# Patient Record
Sex: Male | Born: 1950 | Race: White | Hispanic: No | Marital: Married | Smoking: Never smoker
Health system: Southern US, Community
[De-identification: ages and names within clinical notes are randomized; demographics above are authoritative.]

## PROBLEM LIST (undated history)

## (undated) DIAGNOSIS — R0602 Shortness of breath: Secondary | ICD-10-CM

## (undated) DIAGNOSIS — D649 Anemia, unspecified: Secondary | ICD-10-CM

## (undated) DIAGNOSIS — F419 Anxiety disorder, unspecified: Secondary | ICD-10-CM

## (undated) DIAGNOSIS — F411 Generalized anxiety disorder: Secondary | ICD-10-CM

## (undated) DIAGNOSIS — E785 Hyperlipidemia, unspecified: Secondary | ICD-10-CM

## (undated) DIAGNOSIS — J309 Allergic rhinitis, unspecified: Secondary | ICD-10-CM

## (undated) DIAGNOSIS — M1711 Unilateral primary osteoarthritis, right knee: Secondary | ICD-10-CM

## (undated) DIAGNOSIS — I499 Cardiac arrhythmia, unspecified: Secondary | ICD-10-CM

## (undated) DIAGNOSIS — K219 Gastro-esophageal reflux disease without esophagitis: Secondary | ICD-10-CM

## (undated) DIAGNOSIS — M519 Unspecified thoracic, thoracolumbar and lumbosacral intervertebral disc disorder: Secondary | ICD-10-CM

## (undated) DIAGNOSIS — R079 Chest pain, unspecified: Secondary | ICD-10-CM

## (undated) DIAGNOSIS — M109 Gout, unspecified: Secondary | ICD-10-CM

## (undated) DIAGNOSIS — K5730 Diverticulosis of large intestine without perforation or abscess without bleeding: Secondary | ICD-10-CM

## (undated) DIAGNOSIS — I4891 Unspecified atrial fibrillation: Secondary | ICD-10-CM

## (undated) DIAGNOSIS — T7840XA Allergy, unspecified, initial encounter: Secondary | ICD-10-CM

## (undated) DIAGNOSIS — I10 Essential (primary) hypertension: Secondary | ICD-10-CM

## (undated) DIAGNOSIS — M1710 Unilateral primary osteoarthritis, unspecified knee: Secondary | ICD-10-CM

## (undated) DIAGNOSIS — M5412 Radiculopathy, cervical region: Secondary | ICD-10-CM

## (undated) HISTORY — PX: OTHER SURGICAL HISTORY: SHX169

## (undated) HISTORY — DX: Gout, unspecified: M10.9

## (undated) HISTORY — PX: NASAL RECONSTRUCTION: SHX2069

## (undated) HISTORY — PX: KNEE ARTHROSCOPY: SUR90

## (undated) HISTORY — DX: Radiculopathy, cervical region: M54.12

## (undated) HISTORY — DX: Chest pain, unspecified: R07.9

## (undated) HISTORY — DX: Allergy, unspecified, initial encounter: T78.40XA

## (undated) HISTORY — PX: HERNIA REPAIR: SHX51

## (undated) HISTORY — DX: Unilateral primary osteoarthritis, unspecified knee: M17.10

## (undated) HISTORY — PX: COLONOSCOPY: SHX174

## (undated) HISTORY — DX: Hyperlipidemia, unspecified: E78.5

## (undated) HISTORY — DX: Unspecified thoracic, thoracolumbar and lumbosacral intervertebral disc disorder: M51.9

## (undated) HISTORY — DX: Anxiety disorder, unspecified: F41.9

## (undated) HISTORY — DX: Anemia, unspecified: D64.9

## (undated) HISTORY — PX: JOINT REPLACEMENT: SHX530

## (undated) HISTORY — PX: SPINE SURGERY: SHX786

## (undated) HISTORY — DX: Generalized anxiety disorder: F41.1

## (undated) HISTORY — DX: Diverticulosis of large intestine without perforation or abscess without bleeding: K57.30

## (undated) HISTORY — DX: Allergic rhinitis, unspecified: J30.9

## (undated) HISTORY — PX: BACK SURGERY: SHX140

---

## 2002-01-28 ENCOUNTER — Emergency Department (HOSPITAL_COMMUNITY): Admission: EM | Admit: 2002-01-28 | Discharge: 2002-01-29 | Payer: Self-pay | Admitting: Emergency Medicine

## 2002-02-03 ENCOUNTER — Ambulatory Visit (HOSPITAL_COMMUNITY): Admission: RE | Admit: 2002-02-03 | Discharge: 2002-02-03 | Payer: Self-pay | Admitting: Neurosurgery

## 2002-02-05 ENCOUNTER — Ambulatory Visit (HOSPITAL_COMMUNITY): Admission: RE | Admit: 2002-02-05 | Discharge: 2002-02-05 | Payer: Self-pay | Admitting: Gynecology

## 2002-02-05 ENCOUNTER — Encounter: Payer: Self-pay | Admitting: Neurosurgery

## 2002-02-16 ENCOUNTER — Encounter: Admission: RE | Admit: 2002-02-16 | Discharge: 2002-02-16 | Payer: Self-pay | Admitting: Neurosurgery

## 2002-02-16 ENCOUNTER — Encounter: Payer: Self-pay | Admitting: Neurosurgery

## 2002-02-23 HISTORY — PX: OTHER SURGICAL HISTORY: SHX169

## 2002-03-02 ENCOUNTER — Encounter: Admission: RE | Admit: 2002-03-02 | Discharge: 2002-03-02 | Payer: Self-pay | Admitting: Neurosurgery

## 2002-03-02 ENCOUNTER — Encounter: Payer: Self-pay | Admitting: Neurosurgery

## 2002-03-16 ENCOUNTER — Encounter: Payer: Self-pay | Admitting: Neurosurgery

## 2002-03-16 ENCOUNTER — Encounter: Admission: RE | Admit: 2002-03-16 | Discharge: 2002-03-16 | Payer: Self-pay | Admitting: Neurosurgery

## 2002-03-30 ENCOUNTER — Encounter: Admission: RE | Admit: 2002-03-30 | Discharge: 2002-03-30 | Payer: Self-pay | Admitting: Neurosurgery

## 2002-03-30 ENCOUNTER — Encounter: Payer: Self-pay | Admitting: Neurosurgery

## 2002-04-13 ENCOUNTER — Encounter: Admission: RE | Admit: 2002-04-13 | Discharge: 2002-04-13 | Payer: Self-pay | Admitting: Neurosurgery

## 2002-04-13 ENCOUNTER — Encounter: Payer: Self-pay | Admitting: Neurosurgery

## 2002-05-04 ENCOUNTER — Encounter: Payer: Self-pay | Admitting: Neurosurgery

## 2002-05-06 ENCOUNTER — Encounter: Payer: Self-pay | Admitting: Neurosurgery

## 2002-05-06 ENCOUNTER — Inpatient Hospital Stay (HOSPITAL_COMMUNITY): Admission: RE | Admit: 2002-05-06 | Discharge: 2002-05-07 | Payer: Self-pay | Admitting: Neurosurgery

## 2003-03-22 ENCOUNTER — Encounter: Payer: Self-pay | Admitting: Neurosurgery

## 2003-03-22 ENCOUNTER — Ambulatory Visit (HOSPITAL_COMMUNITY): Admission: RE | Admit: 2003-03-22 | Discharge: 2003-03-22 | Payer: Self-pay | Admitting: Neurosurgery

## 2003-03-29 ENCOUNTER — Encounter: Payer: Self-pay | Admitting: Neurosurgery

## 2003-03-29 ENCOUNTER — Inpatient Hospital Stay (HOSPITAL_COMMUNITY): Admission: RE | Admit: 2003-03-29 | Discharge: 2003-04-02 | Payer: Self-pay | Admitting: Neurosurgery

## 2003-04-26 HISTORY — PX: OTHER SURGICAL HISTORY: SHX169

## 2004-07-26 ENCOUNTER — Ambulatory Visit (HOSPITAL_COMMUNITY): Admission: RE | Admit: 2004-07-26 | Discharge: 2004-07-26 | Payer: Self-pay | Admitting: Orthopedic Surgery

## 2004-07-26 HISTORY — PX: OTHER SURGICAL HISTORY: SHX169

## 2005-02-23 HISTORY — PX: OTHER SURGICAL HISTORY: SHX169

## 2005-04-19 ENCOUNTER — Inpatient Hospital Stay (HOSPITAL_COMMUNITY): Admission: RE | Admit: 2005-04-19 | Discharge: 2005-04-22 | Payer: Self-pay | Admitting: Orthopedic Surgery

## 2006-10-23 ENCOUNTER — Ambulatory Visit: Payer: Self-pay | Admitting: Internal Medicine

## 2006-10-23 LAB — CONVERTED CEMR LAB
ALT: 30 units/L (ref 0–40)
AST: 28 units/L (ref 0–37)
Alkaline Phosphatase: 62 units/L (ref 39–117)
Basophils Relative: 3.3 % — ABNORMAL HIGH (ref 0.0–1.0)
GFR calc non Af Amer: 74 mL/min
Glomerular Filtration Rate, Af Am: 89 mL/min/{1.73_m2}
Glucose, Bld: 104 mg/dL — ABNORMAL HIGH (ref 70–99)
HCT: 47 % (ref 39.0–52.0)
HDL: 45.5 mg/dL (ref >39.0)
Leukocytes, UA: NEGATIVE
PSA: 0.54 ng/mL (ref 0.10–4.00)
Platelets: 217 10*3/uL (ref 150–400)
Potassium: 4.4 meq/L (ref 3.5–5.1)
RBC: 5.21 M/uL (ref 4.22–5.81)
RDW: 12.1 % (ref 11.5–14.6)
Sodium: 140 meq/L (ref 135–145)
Specific Gravity, Urine: 1.025 (ref 1.000–1.03)
Total Protein: 6.8 g/dL (ref 6.0–8.3)
Urobilinogen, UA: 0.2 (ref 0.0–1.0)
VLDL: 19 mg/dL (ref 0–40)
WBC: 5.8 10*3/uL (ref 4.5–10.5)
pH: 5.5 (ref 5.0–8.0)

## 2006-10-24 ENCOUNTER — Ambulatory Visit: Payer: Self-pay | Admitting: Internal Medicine

## 2007-02-25 ENCOUNTER — Ambulatory Visit: Payer: Self-pay | Admitting: Internal Medicine

## 2007-03-17 ENCOUNTER — Ambulatory Visit: Payer: Self-pay | Admitting: Internal Medicine

## 2007-12-27 HISTORY — PX: OTHER SURGICAL HISTORY: SHX169

## 2008-09-25 HISTORY — PX: OTHER SURGICAL HISTORY: SHX169

## 2008-09-30 ENCOUNTER — Emergency Department (HOSPITAL_COMMUNITY): Admission: EM | Admit: 2008-09-30 | Discharge: 2008-09-30 | Payer: Self-pay | Admitting: Emergency Medicine

## 2009-07-27 ENCOUNTER — Encounter: Admission: RE | Admit: 2009-07-27 | Discharge: 2009-07-27 | Payer: Self-pay | Admitting: Neurosurgery

## 2010-01-01 ENCOUNTER — Ambulatory Visit: Payer: Self-pay | Admitting: Internal Medicine

## 2010-01-01 LAB — CONVERTED CEMR LAB
Albumin: 4.3 g/dL (ref 3.5–5.2)
Alkaline Phosphatase: 64 units/L (ref 39–117)
BUN: 11 mg/dL (ref 6–23)
Basophils Absolute: 0 10*3/uL (ref 0.0–0.1)
Cholesterol: 214 mg/dL — ABNORMAL HIGH (ref 0–200)
Direct LDL: 122.6 mg/dL
Eosinophils Absolute: 0.1 10*3/uL (ref 0.0–0.7)
GFR calc non Af Amer: 72.78 mL/min (ref >60)
HDL: 58.4 mg/dL (ref >39.00)
Hemoglobin: 14.4 g/dL (ref 13.0–17.0)
Leukocytes, UA: NEGATIVE
Lymphocytes Relative: 25.7 % (ref 12.0–46.0)
MCHC: 33.7 g/dL (ref 30.0–36.0)
Neutro Abs: 3.6 10*3/uL (ref 1.4–7.7)
Nitrite: NEGATIVE
Potassium: 4.3 meq/L (ref 3.5–5.1)
RDW: 12.4 % (ref 11.5–14.6)
Sodium: 145 meq/L (ref 135–145)
TSH: 2.31 microintl units/mL (ref 0.35–5.50)
Total Protein, Urine: NEGATIVE mg/dL
Triglycerides: 210 mg/dL — ABNORMAL HIGH (ref 0.0–149.0)
Urobilinogen, UA: 0.2 (ref 0.0–1.0)
VLDL: 42 mg/dL — ABNORMAL HIGH (ref 0.0–40.0)

## 2010-01-08 ENCOUNTER — Ambulatory Visit: Payer: Self-pay | Admitting: Internal Medicine

## 2010-01-08 DIAGNOSIS — E785 Hyperlipidemia, unspecified: Secondary | ICD-10-CM | POA: Insufficient documentation

## 2010-01-08 DIAGNOSIS — J309 Allergic rhinitis, unspecified: Secondary | ICD-10-CM | POA: Insufficient documentation

## 2010-01-08 DIAGNOSIS — R079 Chest pain, unspecified: Secondary | ICD-10-CM

## 2010-01-08 DIAGNOSIS — M5412 Radiculopathy, cervical region: Secondary | ICD-10-CM

## 2010-01-08 DIAGNOSIS — K5730 Diverticulosis of large intestine without perforation or abscess without bleeding: Secondary | ICD-10-CM

## 2010-01-08 DIAGNOSIS — M109 Gout, unspecified: Secondary | ICD-10-CM

## 2010-01-08 DIAGNOSIS — IMO0002 Reserved for concepts with insufficient information to code with codable children: Secondary | ICD-10-CM

## 2010-01-08 DIAGNOSIS — M1710 Unilateral primary osteoarthritis, unspecified knee: Secondary | ICD-10-CM | POA: Insufficient documentation

## 2010-01-08 DIAGNOSIS — F419 Anxiety disorder, unspecified: Secondary | ICD-10-CM | POA: Insufficient documentation

## 2010-01-08 DIAGNOSIS — F411 Generalized anxiety disorder: Secondary | ICD-10-CM | POA: Insufficient documentation

## 2010-01-08 DIAGNOSIS — D649 Anemia, unspecified: Secondary | ICD-10-CM

## 2010-01-08 HISTORY — DX: Chest pain, unspecified: R07.9

## 2010-01-08 HISTORY — DX: Allergic rhinitis, unspecified: J30.9

## 2010-01-08 HISTORY — DX: Gout, unspecified: M10.9

## 2010-01-08 HISTORY — DX: Reserved for concepts with insufficient information to code with codable children: IMO0002

## 2010-01-08 HISTORY — DX: Anemia, unspecified: D64.9

## 2010-01-08 HISTORY — DX: Diverticulosis of large intestine without perforation or abscess without bleeding: K57.30

## 2010-01-08 HISTORY — DX: Hyperlipidemia, unspecified: E78.5

## 2010-01-08 HISTORY — DX: Radiculopathy, cervical region: M54.12

## 2010-01-16 ENCOUNTER — Telehealth (INDEPENDENT_AMBULATORY_CARE_PROVIDER_SITE_OTHER): Payer: Self-pay | Admitting: *Deleted

## 2010-01-25 ENCOUNTER — Encounter (HOSPITAL_COMMUNITY): Admission: RE | Admit: 2010-01-25 | Discharge: 2010-03-27 | Payer: Self-pay | Admitting: Internal Medicine

## 2010-01-25 ENCOUNTER — Ambulatory Visit: Payer: Self-pay

## 2010-01-25 ENCOUNTER — Ambulatory Visit: Payer: Self-pay | Admitting: Internal Medicine

## 2010-05-02 ENCOUNTER — Ambulatory Visit (HOSPITAL_COMMUNITY): Admission: RE | Admit: 2010-05-02 | Discharge: 2010-05-02 | Payer: Self-pay | Admitting: Neurosurgery

## 2010-05-25 HISTORY — PX: CERVICAL SPINE SURGERY: SHX589

## 2010-06-13 ENCOUNTER — Encounter (INDEPENDENT_AMBULATORY_CARE_PROVIDER_SITE_OTHER): Payer: Self-pay | Admitting: Neurosurgery

## 2010-06-13 ENCOUNTER — Ambulatory Visit (HOSPITAL_COMMUNITY): Admission: RE | Admit: 2010-06-13 | Discharge: 2010-06-13 | Payer: Self-pay | Admitting: Neurosurgery

## 2010-06-13 ENCOUNTER — Ambulatory Visit: Payer: Self-pay | Admitting: Vascular Surgery

## 2010-12-25 NOTE — Assessment & Plan Note (Signed)
Summary: PHYSICAL--[facility]--RE ESTABLISH PT--STC   Vital Signs:  Patient profile:   60 year old male Height:      71 inches Weight:      247 pounds BMI:     34.57 O2 Sat:      98 % on Room air Temp:     98.1 degrees F oral Pulse rate:   72 / minute BP sitting:   128 / 80  (left arm) Cuff size:   large  Vitals Entered ByZella Ball Ewing (January 08, 2010 1:20 PM)  O2 Flow:  Room air  Preventive Care Screening  Colonoscopy:    Date:  03/17/2007    Next Due:  03/2012    Results:  abnormal   Last Tetanus Booster:    Date:  12/27/2007    Results:  Historical      decliens  flu shot todau   History of Present Illness: here for wellness and to re-stalblish and has significant c-spine djd, and apirl 2010 clavicle fx;  plans to f/u with Dr Hersch/NS for c-spine discussion about more surgury; but also with left chest pain, dull, without radiation, not worse to move the shoulder,  not sure about excercise as he has been holding back on excercise and gym work as NS has asked him not to as this point;  no other aggrevating factors;  doe seem better to lie down and rest;  mild to mod, off and on for 5 to 6 months;  assoc sometimes with sob, but no nausea, diaphoresis,  palp or syncope. Pt denies new neuro symptoms such as headache, facial or extremity weakness  Father had pacer at 61 yo and still alive at 61;  wants to go to [place] for vacation soon - only gave up motorcycle a yr ago.    Preventive Screening-Counseling & Management  Alcohol-Tobacco     Smoking Status: never      Drug Use:  no.    Problems Prior to Update: 1)  Chest Pain  (ICD-786.50) 2)  Cervical Radiculopathy, Right  (ICD-723.4) 3)  Anemia-nos  (ICD-285.9) 4)  Diverticulosis, Colon  (ICD-562.10) 5)  Anxiety  (ICD-300.00) 6)  Gout  (ICD-274.9) 7)  Hyperlipidemia  (ICD-272.4) 8)  Preventive Health Care  (ICD-V70.0) 9)  Allergic Rhinitis  (ICD-477.9)  Medications Prior to Update: 1)  None  Current Medications  (verified): 1)  Gabapentin 300 Mg Caps (Gabapentin) .Marland Kitchen.. 1 By Mouth 6 Times Per Day 2)  Fexofenadine Hcl 180 Mg Tabs (Fexofenadine Hcl) .Marland Kitchen.. 1 By Mouth Once Daily 3)  Celebrex 200 Mg Caps (Celecoxib) .... 2 By Mouth Once Daily 4)  Multivitamins  Tabs (Multiple Vitamin) .Marland Kitchen.. 1 By Mouth Once Daily 5)  Fish Oil 1000 Mg Caps (Omega-3 Fatty Acids) .Marland Kitchen.. 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[place] no children Never Smoked Alcohol use-yes Drug use-no  Risk Factors: Smoking Status: never (01/08/2010)  Past Medical History: DJD - knee, lumbar, c-spine Allergic rhinitis/chronic sinusitis right cervical radiculpathy after fall april 2010 Hyperlipidemia Gout Anxiety alopecia Diverticulosis, colon Anemia-NOS - post -op  Past Surgical History: may 2003 - lumbar disc surgury june 2004 - lumbar disc surgury sept 2005 - left knee arthroscopy april 2006 - s/p left TKR feb 2009 - s/p left shoulder surgury nov 2009 - clavicle  fracture, 100% displacement s/p ENT surgury x 3  Family History: Reviewed history and no changes required. parent with DJD father with pacer mother with lymphoma  Social History: Reviewed history and no changes required. 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         all otherwise negative per pt -  Physical Exam  General:  alert and overweight-appearing.   Head:  normocephalic and atraumatic.   Eyes:  vision grossly intact, pupils equal, and pupils round.   Ears:  R ear normal and L ear normal.   Nose:  no external deformity and no nasal discharge.   Mouth:  no gingival abnormalities and pharynx pink and moist.   Neck:  supple and no masses.   Lungs:  normal respiratory effort and normal breath sounds.   Heart:  normal rate and regular rhythm.   Abdomen:  soft, non-tender, and normal bowel sounds.   Msk:  no joint tenderness and no joint swelling.  except for marked bialt knee crepitus and bony changes, mild left effusion and warmth but nontender;  also RUE with mild prox and distal RUE weakness and numbness and decreased DTR's Extremities:  no edema, no erythema  Neurologic:  cranial nerves II-XII intact and strength normal in all extremities.  except for right cer radiculopathy changes as above   Impression & Recommendations:  Problem # 1:  Preventive Health Care (ICD-V70.0)  Overall doing well, age appropriate education and counseling updated and referral for appropriate preventive services done unless declined, immunizations up to date or declined, diet counseling done if overweight, urged to quit smoking if smokes , most recent labs reviewed and current ordered if appropriate, ecg reviewed or declined (interpretation per ECG scanned in the EMR if done); information regarding Medicare Prevention requirements given if appropriate   Orders: EKG w/ Interpretation (93000)  Problem # 2:  CHEST PAIN (ICD-786.50)  atypical with several risk facotrs and wants to start going  back to the gym ; will check stress test;  has has signficant left knee sweling with ellipitcal machine use before but is willing for ambulatory type stress test  Orders: Cardiolite  (Cardiolite)  Problem # 3:  HYPERLIPIDEMIA (ICD-272.4)  Labs Reviewed: SGOT: 47 (01/01/2010)   SGPT: 60 (01/01/2010)   HDL:58.40 (01/01/2010), 45.5 (10/23/2006)  LDL:115 (10/23/2006)  Chol:214 (01/01/2010), 179 (10/23/2006)  Trig:210.0 (01/01/2010), 93 (10/23/2006) declines statin at this time;  to Pt to continue diet efforts, good med tolerance; to check labs - goal LDL less than 100  Problem # 4:  CERVICAL RADICULOPATHY, RIGHT (ICD-723.4) overall stable, but plans to f/u with surgury  Problem # 5:  OSTEOARTHRITIS, KNEES, BILATERAL (ICD-715.96)  His updated medication list for this problem includes:    Celebrex 200 Mg Caps (Celecoxib) .Marland Kitchen... 2 by mouth once daily treat as above, f/u any worsening signs or symptoms , f/u ortho as needed  Complete Medication List: 1)  Gabapentin 300 Mg Caps (Gabapentin) .Marland Kitchen.. 1 by mouth 6 times per day 2)  Fexofenadine Hcl 180 Mg Tabs (Fexofenadine hcl) .Marland Kitchen.. 1 by mouth once daily  3)  Celebrex 200 Mg Caps (Celecoxib) .... 2 by mouth once daily 4)  Multivitamins Tabs (Multiple vitamin) .Marland Kitchen.. 1 by mouth once daily 5)  Fish Oil 1000 Mg Caps (Omega-3 fatty acids) .Marland Kitchen.. 1 by mouth two times a day  Patient Instructions: 1)  Continue all previous medications as before this visit  2)  You will be contacted about the referral(s) to: Stress test 3)  Please schedule a follow-up appointment in 1 year or sooner if needed   Immunization History:  Tetanus/Td Immunization History:    Tetanus/Td:  historical (12/27/2007)

## 2010-12-25 NOTE — Progress Notes (Signed)
Summary: Nuclear Pre-Procedure  Phone Note Outgoing Call Call back at [facility] Phone [phone]   Call placed by: Stanton Kidney, EMT-P,  January 16, 2010 2:05 PM Action Taken: Phone Call Completed Summary of Call: Left message with information on Myoview Information Sheet (see scanned document for details).     Nuclear Med Background Indications for Stress Test: Evaluation for Ischemia, Surgical Clearance  Indications Comments: Pending Surgical Clearance - Dr, Colon Branch    Symptoms: Chest Pain, SOB    Nuclear Pre-Procedure Cardiac Risk Factors: Lipids Height (in): 71   Appended Document: Nuclear Pre-Procedure The patient was a no show for myoview today. I called the patient and he knew that a myoview was going to be scheduled by Dr. Raphael Gibney office, but hadn't been called with appointment per patient.Rescheduled the myoview to 01/25/10 at 9:45 am Cleon Gustin

## 2010-12-25 NOTE — Assessment & Plan Note (Signed)
Summary: Cardiology Nuclear Study  Nuclear Med Background Indications for Stress Test: Evaluation for Ischemia, Surgical Clearance  Indications Comments: Patient wants to get back into exercise program. Pending Surgical Clearance - Dr, Fayrene Fearing Hirsch-Date unknown   History Comments: NO DOCUMENTED CAD  Symptoms: Chest Pain, DOE, Fatigue, SOB  Symptoms Comments: Last episode of CP:1.5 months ago.   Nuclear Pre-Procedure Cardiac Risk Factors: Lipids, Obesity Caffeine/Decaff Intake: None NPO After: 9:00 PM Lungs: Clear IV 0.9% NS with Angio Cath: 20g     IV Site: (R) AC IV Started by: Irean Hong RN Chest Size (in) 48     Height (in): 71 Weight (lb): 240 BMI: 33.59  Nuclear Med Study 1 or 2 day study:  1 day     Stress Test Type:  Stress Reading MD:  Dietrich Pates, MD     Referring MD:  Oliver Barre, MD Resting Radionuclide:  Technetium 19m Tetrofosmin     Resting Radionuclide Dose:  11.0 mCi  Stress Radionuclide:  Technetium 21m Tetrofosmin     Stress Radionuclide Dose:  32.0 mCi   Stress Protocol Exercise Time (min):  10:45 min     Max HR:  141 bpm     Predicted Max HR:  161 bpm  Max Systolic BP: 196 mm Hg     Percent Max HR:  87.58 %     METS: 12.5 Rate Pressure Product:  16109    Stress Test Technologist:  Rea [employer] CMA-N     Nuclear Technologist:  Burna Mortimer Deal RT-N  Rest Procedure  Myocardial perfusion imaging was performed at rest 45 minutes following the intravenous administration of Myoview Technetium 23m Tetrofosmin.  Stress Procedure  The patient exercised for 10:45.  The patient stopped due to fatigue and denied any chest pain.  There were no significant ST-T wave changes, only occasional PVC's with rare couplet and rare PAC's.  Myoview was injected at peak exercise and myocardial perfusion imaging was performed after a brief delay.  QPS Raw Data Images:  Extensive soft tissue (diaphragm, subcuateous fat) surround heart. Stress Images:  Thinning with decreased  tracer counts in the inferoseptal wall (base, mid, distal)  theorwise normal perfusion. Rest Images:  No significant change from the stress images. Subtraction (SDS):  No ischemia. Transient Ischemic Dilatation:  .87  (Normal <1.22)  Lung/Heart Ratio:  .33  (Normal <0.45)  Quantitative Gated Spect Images QGS EDV:  128 ml QGS ESV:  60 ml QGS EF:  53 %   Overall Impression  Exercise Capacity: Excellent exercise capacity. BP Response: Normal blood pressure response. Clinical Symptoms: No chest pain ECG Impression: No significant ST segment change suggestive of ischemia. Overall Impression Comments: Clinically and electrically neg.  Excellent exercise tolerange.  Myoview with iInferoseptal thinning consistent with probable soft tissue attenuation (diaphragm), cannot completely exclude subendocardial scar.  No evidence for ischemia.  Normal perfusion elsewhere.  LVEF calculated at 53% though visually appears somewhat better.   Appended Document: Cardiology Nuclear Study LMOPT - labs negative, normal, or stable  - No Acute problem

## 2011-04-12 NOTE — Op Note (Signed)
NAMECALDER, OBLINGER NO.:  [id number]   MEDICAL RECORD NO.:  [id number]          PATIENT TYPE:  INP   LOCATION:  X002                         FAC[facility]   PHYSICIAN:  Marlowe Kays, M.D.  DATE OF BIRTH:  [birth year]   DATE OF PROCEDURE:  04/19/2005  DATE OF DISCHARGE:                                 OPERATIVE REPORT   PREOPERATIVE DIAGNOSIS:  Osteoarthritis, left knee.   POSTOPERATIVE DIAGNOSIS:  Osteoarthritis, left knee.   OPERATION:  Osteonics total knee replacement.   SURGEON:  Marlowe Kays, M.D.   ASSISTANT:  Georges Lynch. Darrelyn Hillock, M.D.   ANESTHESIA:  General.   INDICATIONS FOR PROCEDURE:  Patient has had multiple knee arthroscopies.  He  has __________ degenerative arthritis and severe pain.   PROCEDURE:  Prophylactic antibiotics.  Satisfactory general anesthesia.  Pneumatic tourniquet.  Sure-Foot.  The left leg was prepped with DuraPrep  from tourniquet to ankle and draped in a sterile field.  Ioban employed.  The leg was esmarched out nonsterilely.  A vertical midline incision down to  the patellar mechanism with median parapatellar incision to open the joint.  The patellar mechanism was freed up.  The knee bent.  The patella everted.  Osteophytes were removed from around the femur and patella.  It was sized at  a 28.  Remnants of the ACL and the PCL were removed.  The anterior portions  of both menisci were freed up.  He had advanced chondromalacia of all  compartments of the joint.  I made a 5/16 inch drill hole in the distal  femur followed by the axis liner for the 5 degree valgus cut to the left  knee, removing 10 mm off the distal femur.  This knee was extremely tight.  I did a leveling cut on the tibia to allow more room for the sizing jig for  the distal femur at a #11.  Scribe holes were placed for fitting the distal  femoral cutting jig, and anterior and posterior cuts and posterior anterior  __________ were then constructed.  I  next turned to the tibia, where we  sized it at an 11 and made our intramedullary drill hole, followed by the  step-cut drill and then the intramedullary rod.  I then used the  intramedullary rod with the 90 degree cutting jig for a 4 mm cut up the  slightly depressed lateral tibial plateau.  This cut was made.  There was a  bone islet posterolaterally, requiring another 2 mm cut to eradicate it.  We  placed a laminar spreader and removed remnants of bone and soft tissue from  around the posterior femoral condyles.  I then used a guide for creating not  only the patellar groove but for the stabilizing post.  After these cuts had  been completed, we went through a trial reduction and found that the knee  was a little tight over the 10 mm spacer and accordingly, I took off another  2 mm from the proximal tibia, which corrected this problem.  With the 11  tray in place, and using the external rods, splitting the  bimalleolar  distance, I marked scribe lines on the anterior tibia.  While the knee was  in extension, I then placed a 10 mm recess cutting jig through the patella,  making the 10 mm recess cut followed by the guide for creating the three  fixation drill holes.  I then placed the trial 28 patella, trimming excess  bone from around the perimeter.  We then flexed the knee and fixed the #11  tibial base plate and used a tripod apparatus to ream the keel up to an 11  cemented.  We then water-picked the knee while the methylmethacrylate was  being mixed.  Some methylmethacrylate was placed on the posterior runners of  the femoral component.  We then individually glued in tibia, femur, and  patella.  In each case, impacted the component and trimmed the excess  methylmethacrylate.  We held the knee in extension with a 10 mm spacer while  the patella was locked in with a patellar holding clamp.  When the  methacrylate had hardened, we trimmed up remnants from around the components  and checked  the posterior runners of the femoral condyles.  We then went  through another trial reduction with a 10 mm posterior stabilized component  and found it to fit ideally.  Consequently went ahead and used the final 10  mm posterior stabilized insert.  No lateral release was required for the  patella.  Hemovac was placed, and the wound closed with interrupted #1  Vicryl in two layers of the quadriceps and distally in the synovium and  capsule.  A combination of 1-0 and 2-0 Vicryl in the subcutaneous tissues  and staples in the skin.  Betadine and dry sterile dressing were applied.  The tourniquet was released.  A knee immobilizer applied.  Tolerated the  procedure well and was taken to the recovery room in satisfactory condition  with no known complications.      JA/MEDQ  D:  04/19/2005  T:  04/19/2005  Job:  [id number]

## 2011-04-12 NOTE — Op Note (Signed)
NAME:  Todd Barrett, Todd Barrett                      ACCOUNT NO.:  [id number]   MEDICAL RECORD NO.:  [id number]                   PATIENT TYPE:  AMB   LOCATION:  DAY                                  FACILITY:  [facility]   PHYSICIAN:  Marlowe Kays, M.D.               DATE OF BIRTH:  1951-04-23   DATE OF PROCEDURE:  07/26/2004  DATE OF DISCHARGE:                                 OPERATIVE REPORT   PREOPERATIVE DIAGNOSES:  1.  Torn medial and lateral menisci.  2.  Medial compartment osteoarthritis, left knee.   POSTOPERATIVE DIAGNOSES:  1.  Torn medial and lateral menisci.  2.  Medial compartment osteoarthritis, left knee.   OPERATION:  Left knee arthroscopy with partial medial and lateral  meniscectomy and shaving of the medial femoral condyle.   SURGEON:  Marlowe Kays, M.D.   ASSISTANT:  Nurse.   ANESTHESIA:  General.   PATHOLOGY AND JUSTIFICATION FOR PROCEDURE:  He has had prior knee  arthroscopy, known to have osteoarthritis and a twisting injury with pain  and swelling leading to MRI on Mar 30, 2004 which showed the above mentioned  findings.  Accordingly he is here today for the above mentioned surgery.   DESCRIPTION OF PROCEDURE:  Satisfactory general anesthesia, pneumatic  tourniquet, leg was esmarched out nonsterilely, thigh stabilizer, left knee  was prepped with Duraprep, draped in a sterile field, Ace wrap and knee  support for the right leg.  Superomedial saline inflow, first through an  anterior lateral portal, the medial compartment of the knee joint was  evaluated. He had grade 3 bordering on grade 4 chondromalacia of the medial  femoral condyle which is shaved down until smooth with a combination of rasp  and 3.5 shaver.  He had several areas of tear in the posterior third of the  medial meniscus particularly in the curve which I trimmed back with baskets  and shaved down until smooth with a 3.5 shaver. Looking up at the medial  gutter and suprapatellar area, no  significant abnormalities were noted. I  then reversed portals, he had some minimal arthritis in his lateral  compartment of the knee joint but a badly macerated anterior horn of the  lateral meniscus going back to the posterior curve. I corrected this with a  combination of arthroscopic scissors, baskets and a 3.5 shaver.  The knee  joint was then irrigated until clear and all fluid possible removed.  The  two anterior portals were closed with 4-0 nylon.  I gave him 4 mg of  morphine and 20 mL of 0.5% Marcaine with adrenaline through the inflow  apparatus which was  removed and this portal closed with 4-0 nylon as well.  Betadine Adaptic dry  sterile dressing were applied. The tourniquet was released, he tolerated the  procedure well and was taken to the recovery room in satisfactory condition  with no known complications.  Marlowe Kays, M.D.    JA/MEDQ  D:  07/26/2004  T:  07/26/2004  Job:  [id number]

## 2011-04-12 NOTE — Discharge Summary (Signed)
NAME:  Todd Barrett, Todd Barrett                      ACCOUNT NO.:  [id number]   MEDICAL RECORD NO.:  [id number]                   PATIENT TYPE:  INP   LOCATION:  3013                                 FACILITY:  [facility]   PHYSICIAN:  Clydene Fake, M.D.               DATE OF BIRTH:  [birth year]   DATE OF ADMISSION:  03/29/2003  DATE OF DISCHARGE:  04/02/2003                                 DISCHARGE SUMMARY   PREOPERATIVE DIAGNOSIS:  Recurrent herniated nucleus pulposis, left L4-5.   POSTOPERATIVE DIAGNOSIS:  Recurrent herniated nucleus pulposis, left L4-5.   PROCEDURES:  Redo left L4-5 semihemilaminectomy, diskectomy, and resection  with microscope.   REASON FOR ADMISSION:  The patient is a 60 year old gentleman who underwent  a left L4-5 and S1 decompressive laminectomy and diskectomy about a year  ago.  He was doing great, but had recurrent back pain starting at the end of  February or the beginning of March.  This pain radiated down his leg, his  leg being worse than back pain.  He noticed similar weakness in the left leg  and numbness in the left foot.  An MRI was done showing large recurrent disk  herniation at left L4-5.  Exam did show 4+/5 strength at the left L5 root  and decreased sensation.  The patient was admitted for redo diskectomy.   HOSPITAL COURSE:  The patient was admitted on the day of surgery and  underwent the procedure as above.  He did have a CSF leak with a dural tear  due to scar during the surgery.  This was sealed with tissue glue.  Postoperatively, he was transferred to the recovery room and then to the  floor where he had done well.  We kept him on flat bed rest.  He had no  drainage from the incision.  He had much less leg pain and was moving his  legs well with improved strength.  __________.  He had no drainage from the  incision and no headache.  He slowly increased his activity on Apr 01, 2003,  slowly increased the head of the bed, and started  ambulating.  He was doing  well and still had no drainage from the incision, no fluctuance of the  incision, no sign of leak, and no headache.  Meanwhile, his leg symptoms  were much, much improved and he had good strength.  He was ambulating well.  He was eating well and voiding.  On Apr 02, 2003, the patient was discharged  home in stable condition.   DISCHARGE MEDICATIONS:  Same as prehospitalization plus ____________ p.r.n.  pain and Flexeril p.r.n.   FOLLOWUP:  Followup will be 10-14 days from surgery for suture removal.  Clydene Fake, M.D.    JRH/MEDQ  D:  04/21/2003  T:  04/21/2003  Job:  [id number]

## 2011-04-12 NOTE — H&P (Signed)
NAME:  Todd Barrett, Todd Barrett NO.:  [id number]   MEDICAL RECORD NO.:  [id number]           PATIENT TYPE:   LOCATION:                                 FACILITY:   PHYSICIAN:  Todd Barrett, M.D.  DATE OF BIRTH:  1951/11/17   DATE OF ADMISSION:  04/19/2005  DATE OF DISCHARGE:                                HISTORY & PHYSICAL   HISTORY PHYSICAL PERFORMED:  Performed in our office on Apr 05, 2005.   CHIEF COMPLAINT:  Pain in my left knee.   HISTORY OF PRESENT ILLNESS:  A 60 year old white male has been seen by Dr.  Fayrene Fearing Barrett for continued and progressive problems concerning pain into  his left knee. He has had arthroscopy to this knee in the past back in  September of last year with some medial and lateral meniscectomies. This  gave him some relief for a short period of time but he continued with pain.  He has had viscus supplementation to the knee utilizing Hyalgan which has  given him some relief over the last several months. We also had him in  physical therapy for strengthening of the leg and stabilization of the knee  but unfortunately he has progressed to the point he now has difficulty  performing day to day activities including ownership/management of a rental  business. We have tried anti-inflammatories with little to no results. 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 He  essentially has bone-on-bone deformity. Due to these progressive problems  and failure of conservative care, unfortunately, we have decided to go ahead  with total knee arthroplasty.   After the risks and benefits were discussed with the patient, it was decided  to go ahead with total knee arthroplasty to the left knee.   PAST MEDICAL HISTORY:  Todd Barrett has been in relatively good health throughout  his life time.   ALLERGIES:  He has no medical allergies.   MEDICATIONS:  He  takes Allegra, Flonase, and Astelin.   PAST SURGICAL HISTORY:  1.  Five arthroscopic knee surgeries, both left and right.  2.  Nasal reconstruction x 3.  3.  Lumbar laminectomies for discectomies in 2003 and 2004.   FAMILY HISTORY:  Positive for lymphatic cancer in the mother.   SOCIAL HISTORY:  The patient is married. He owns [employer] here in  [place]. He has no intake of tobacco products. Has occasional intake of  ETOH. He has no children and his wife Todd Barrett will be care giver after  surgery. He lives in two story home with one flight of stairs.   REVIEW OF SYMPTOMS:  CNS:  No seizure disorder, paralysis, numbness, or  double vision. RESPIRATORY:  No productive, no hemoptysis, no shortness of  breath. CARDIOVASCULAR:  No chest pain, no angina, and no orthopnea.  GASTROINTESTINAL:  No nausea, vomiting, melena, or bloody stool.  GENITOURINARY:  No discharge, dysuria, hematuria. MUSCULOSKELETAL:  Primarily in present illness with left knee.   PHYSICAL EXAMINATION:  GENERAL:  Alert, cooperative, and friendly.  A stocky  60 year old white male.  VITAL SIGNS:  Blood pressure 118/82, pulse 68, respirations 12.  HEENT:  Normocephalic.  PERRLA. EOMI. Oropharynx is clear.  CHEST:  Clear to auscultation. No rhonchi and no rales.  HEART:  Regular rate and rhythm. No murmurs are heard.  ABDOMEN:  Soft and nontender. Liver and spleen not felt.  GENITOURINARY:  Not done, not pertinent to present illness.  RECTAL:  Not done, not pertinent to present illness.  EXTREMITIES:  The patient has painful range of motion of the left knee with  crepitus.   ADMISSION DIAGNOSIS:  Severe osteoarthritis of the left knee.   PLAN:  The patient will undergo a left total knee arthroplasty. We will plan  outpatient physical therapy in the future and home help immediately after  his hospitalization.      DLU/MEDQ  D:  04/06/2005  T:  04/06/2005  Job:  [id number]

## 2011-04-12 NOTE — Op Note (Signed)
[facility]  Patient:    Todd Barrett, Todd Barrett Visit Number: [id number] MRN: [id number]          Service Type: SUR Location: 3000 [id number] [id number] Attending Physician:  Colon Branch Dictated by:   Clydene Fake, M.D. Proc. 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  PROCEDURES: 1. Left L4-5 and L5-S1 decompressive semihemilaminectomies and diskectomy. 2. Microdissection with microscope.  SURGEON:  Clydene Fake, M.D.  ASSISTANT:  Izell [place]. Elesa Hacker, M.D.  ANESTHESIA:  General endotracheal tube.  ESTIMATED BLOOD LOSS:  Minimal.  BLOOD GIVEN:  None.  DRAINS:  None.  COMPLICATIONS:  None.  REASON FOR PROCEDURE:  The patient is a 60 year old gentleman who continues to have back and left leg pain in more of an L5 distribution.  Injections helped for a couple of days each but did not give any lasting relief.  The pain down his leg is making him miserable.  He does have some slightly distal extensor hallucis longus decreased sensation L5 root.  MRI and myelogram done showing degenerative disk disease and spondylosis at both levels with some lateral recess stenosis and foraminal narrowing and disk herniation at L4-5 on the left side and large disk bulge and mild disk herniation at 5-1 also.  Patient brought in for decompressive laminectomy.  DESCRIPTION OF PROCEDURE:  The patient was brought into the operating room and general anesthesia was induced.  The patient was placed in the prone position on a Wilson frame with all pressure points padded.  He was prepped and draped in a sterile fashion, and the site of injection was injected with 10 cc of  1% lidocaine with epinephrine.  A needle was placed in what was thought to be the 4-5 interspace, x-ray obtained showing this was the 4-5 interspace.  Incision was then made over the 4, 5, and S1 spinous processes.  The incision taken down to the fascia and hemostasis obtained with Bovie cauterization.  The fascia was incised and subperiosteal dissection was done of the L4, L5, and S1 spinous processes and laminae out to the facets.  A self-retaining retractor was placed at the 4-5 level and markers were placed in the interspaces at 4-5 and 5-1 and another x-ray obtained confirming that these were the 4-5 and 5-1 levels.  The microscope was brought in for microsdissection at this point, and a high-speed drill was used to perform a laminectomy on the left side at L4-5, removing the inferior part of 4, the superior aspect of 5, medial facetectomy performed, and foraminotomy was done over the 5 root.  The ligamentum flavum, which was thickened, was removed.  We then explored the disk space and a large disk bulge and subligamentous disk herniation were seen.  Hemostasis in the epidural space was obtained with bipolar cauterization and the disk space was incised with a 15 blade and a diskectomy was performed with pituitary rongeurs and curettes.  After this we had good decompression in the proximal 5 root, and the 4 root was also decompressed.  Gelfoam and thrombin were placed over the dura, and attention was taken to the 5-1 level.  There the laminectomy was performed again with high-speed drill, Kerrison punches removing the inferior aspect of 5 and the superior aspect of S1.  Medial facetectomy was performed, and there was a large spur and significant thickened ligament that was compressing and pushing on the S1 root, causing lateral recess stenosis, and this was removed.  Foraminotomy of the S1 root was done.  The S1 root was decompressed with this maneuver, but there was a large disk bulge.   The disk was incised with a 15 blade and diskectomy was performed with pituitary rongeurs and curettes.  Foraminotomy was done distally to the L5 root also, and we had good decompression of the 5 and S1 roots from the 5-1 level.  The wound was irrigated with antibiotic solution and hemostasis obtained with bipolar cauterization and Gelfoam with thrombin.  Gelfoam was then irrigated out and we had good hemostasis, and the fascia was closed with 0 Vicryl interrupted sutures, the subcutaneous tissue was closed with 0, 2-0, and 3-0 Vicryl interrupted sutures, and the skin closed with Dermabond skin glue.  The patient was returned to the supine position, awoken from anesthesia, and transferred to the recovery room in stable condition. Dictated by:   Clydene Fake, M.D. Attending Physician:  Colon Branch DD:  05/06/02 TD:  05/09/02 Job: [id number]

## 2011-04-12 NOTE — Op Note (Signed)
NAME:  Todd Barrett, Todd Barrett                      ACCOUNT NO.:  [id number]   MEDICAL RECORD NO.:  [id number]                   PATIENT TYPE:  INP   LOCATION:  3013                                 FACILITY:  [facility]   PHYSICIAN:  Clydene Fake, M.D.               DATE OF BIRTH:  22-May-1951   DATE OF PROCEDURE:  DATE OF DISCHARGE:                                 OPERATIVE REPORT   PREOPERATIVE DIAGNOSIS:  Recurrent herniated nucleus pulposus, left L4-5.   POSTOPERATIVE DIAGNOSIS:  Recurrent herniated nucleus pulposus, left L4-5.   OPERATION PERFORMED:  Redo left L4-5 semihemilaminectomy and diskectomy,  microdissection with microscope.   SURGEON:  Clydene Fake, M.D.   ASSISTANT:  Payton Doughty, M.D.   ANESTHESIA:  General endotracheal.   ESTIMATED BLOOD LOSS:  Minimal.   BLOOD REPLACED:  None.   DRAINS:  None.   COMPLICATIONS:  CSF leak.  Dural rent occurred and this was closed with  Tisseel tissue glue.   INDICATIONS FOR PROCEDURE:  The patient is a 60 year old gentleman who  underwent left 4-5 and 5-1 decompressive laminectomy and diskectomy about a  year ago and was doing great but has had recurrent back pain described  throughout period of time until February 28 following which he had pain in  the left side of his back, buttock and down his leg with leg the worst pain.  He has been taking Vioxx pain medication.  He has a little left dropping of  his foot after he walks a little while.  Does complain of numbness in his  left foot.  MRI was done showing a large recurrent disk herniation on the  left at L4-5.  Exam shows 4+ strength.  Motor groups left L5 distribution is  decreased in sensation and the patient is brought in for redo diskectomy.   DESCRIPTION OF PROCEDURE:  The patient was brought to the operating room.  General anesthesia was induced.  The patient was placed in the prone  position on the Wilson frame with all pressure points padded.  The patient  was  prepped and draped in sterile fashion.  The site of incision was  injected with 10mL of 1% lidocaine with epinephrine.  Incision was then made  at the site of previous incision.  The superior aspect was used.  Incision  taken down to the fascia.  Hemostasis obtained with Bovie cauterization.  Fascia incised with the Bovie and subperiosteal was done to the spinous  process and lamina out to the facets.  Laminectomy defect was seen and this  was dissected out with curets and hooks.  Marker was placed at the  interspace.  X-ray was obtained showing this was at the 4-5 interspace.  Microscope was brought in for microdissection at this point.  A high speed  drill was used to enlarge the semihemilaminectomy.  Kerrison punches were  then used to remove any remnant bone and removed  bone both cephalad and  caudally and did a foraminotomy over the five root to expose the pedicle.  I  dissected up to the disk space.  You could see free fragment of disk which  was loose.  A hook was used to loosen it up and we started removing it with  pituitary rongeurs.  A couple of pieces were fairly large as they came out  caudal.  CSF was seen coming out after that.  This area was packed off.  We  continued decompression removing scar making sure that each boring up above  was decompressed removing packing and no further CSF was seen.  We explored  the disk space and continued diskectomy.  No further free fragments were  seen, but we did more disk from the disk space.  The dural rent was  anterior.  We were unable to get any sutures into it as we were looking at  the underside of dura.  We did see a little more CSF come out but really not  all that much.  We placed Surgicel over this area and then placed Tisseel  tissue glue over that, sealing off the CSF.  Wound was irrigated with  antibiotic solution.  Retractors were removed and the fascia closed with 0  Vicryl interrupted suture, the subcutaneous tissue closed  with 0, 2-0 and 3-  0 Vicryl suture and the skin closed with running locking nylon suture.  Dressing was placed and the patient was placed back in supine position and  awakened from anesthesia and transferred to recovery room.  We will be  keeping him flat.                                               Clydene Fake, M.D.    JRH/MEDQ  D:  03/29/2003  T:  03/30/2003  Job:  [id number]

## 2011-04-12 NOTE — Discharge Summary (Signed)
Todd Barrett, MICUCCI NO.:  [id number]   MEDICAL RECORD NO.:  [id number]          PATIENT TYPE:  INP   LOCATION:  1518                         FACILITY:  [facility]   PHYSICIAN:  Marlowe Kays, M.D.  DATE OF BIRTH:  25-May-1951   DATE OF ADMISSION:  04/19/2005  DATE OF DISCHARGE:  04/22/2005                                 DISCHARGE SUMMARY   ADMITTING DIAGNOSIS:  Severe osteoarthritis of the left knee.   DISCHARGE DIAGNOSES:  1.  Severe osteoarthritis of the left knee.  2.  Mild postoperative anemia.   OPERATION:  On Apr 19, 2005, the patient underwent [employer] total knee  replacement arthroplasty of the left knee.  Dr. Ranee Gosselin assisted.   CONSULTS:  None.   BRIEF HISTORY:  This 60 year old white male with progressive problems  concerning his left knee.  This knee was scoped in September of last year.  Medial and lateral meniscectomy was performed which gave him a short period  of relief.  He has had Visco supplementation to the knee which again gave  him short term relief.  He is now to the point where he is having marked  interfering with his day-to-day activity.  The x-ray has showed narrowing in  the femoral condyles.  After much discussion and consideration, it was felt  the patient would benefit with surgical interventions and after the risks  and benefits had been described to the patient, he decided to go ahead with  the above procedure.   COURSE IN HOSPITAL:  The patient tolerated the surgical procedure quite  well, was placed on total knee protocol postoperatively, doing quite well.  He was ambulating in the hallway.  Wound remained dry.  He was progressing  through the protocol quite rapidly.  He was allowed weightbearing as  tolerated.  He was anxious to go home, and arrangements were made for his  discharge.  He will have home health with [facility].   Laboratory values in the hospital hematological showed a preoperative CBC  completely within normal limits.  Hemoglobin was 16.4, hematocrit 46.8.  Final hemoglobin was 10.4, hematocrit 29.6.  Blood chemistries all within  normal limits.  Urinalysis preoperatively showed a questionable urinary  tract infection.  This was repeated as well as cultures done on the  urinalysis, and no growth was noted.  The electrocardiogram showed normal  sinus rhythm, and chest x-ray showed no evidence of acute cardiopulmonary  disease, chronic changes.   CONDITION ON DISCHARGE:  Improved, stable.   PLAN:  The patient discharged to his home to continue with home health at  this time with [facility].  Return to see Dr. Simonne Come 2 weeks after the date  of surgery.  Percocet is used for discomfort, Robaxin as a muscle relaxant.  Use dry dressing p.r.n.  Continue with weightbearing as tolerated and follow-  up with his family physician as indicated.      DLU/MEDQ  D:  04/26/2005  T:  04/27/2005  Job:  [id number]   cc:   [facility]

## 2011-07-30 ENCOUNTER — Ambulatory Visit: Payer: Self-pay

## 2011-07-30 DIAGNOSIS — Z0000 Encounter for general adult medical examination without abnormal findings: Secondary | ICD-10-CM

## 2011-07-30 LAB — URINALYSIS
Ketones, ur: NEGATIVE
Specific Gravity, Urine: 1.015 (ref 1.000–1.030)
Total Protein, Urine: NEGATIVE
Urine Glucose: NEGATIVE
Urobilinogen, UA: 0.2 (ref 0.0–1.0)

## 2011-07-30 LAB — CBC WITH DIFFERENTIAL/PLATELET
Basophils Absolute: 0 10*3/uL (ref 0.0–0.1)
Eosinophils Absolute: 0.2 10*3/uL (ref 0.0–0.7)
Lymphocytes Relative: 31.4 % (ref 12.0–46.0)
MCHC: 34 g/dL (ref 30.0–36.0)
Neutrophils Relative %: 56.4 % (ref 43.0–77.0)
RDW: 13.2 % (ref 11.5–14.6)

## 2011-07-30 LAB — HEPATIC FUNCTION PANEL
ALT: 30 U/L (ref 0–53)
AST: 29 U/L (ref 0–37)
Bilirubin, Direct: 0.1 mg/dL (ref 0.0–0.3)
Total Protein: 6.6 g/dL (ref 6.0–8.3)

## 2011-07-30 LAB — BASIC METABOLIC PANEL
Chloride: 104 mEq/L (ref 96–112)
Creatinine, Ser: 1 mg/dL (ref 0.4–1.5)
GFR: 81.75 mL/min (ref >60.00)
Potassium: 4.3 mEq/L (ref 3.5–5.1)

## 2011-07-30 LAB — PSA: PSA: 0.74 ng/mL (ref 0.10–4.00)

## 2011-07-30 LAB — LIPID PANEL
Cholesterol: 183 mg/dL (ref 0–200)
LDL Cholesterol: 91 mg/dL (ref 0–99)
Triglycerides: 192 mg/dL — ABNORMAL HIGH (ref 0.0–149.0)
VLDL: 38.4 mg/dL (ref 0.0–40.0)

## 2011-08-02 ENCOUNTER — Encounter: Payer: Self-pay | Admitting: Internal Medicine

## 2011-08-02 DIAGNOSIS — Z0000 Encounter for general adult medical examination without abnormal findings: Secondary | ICD-10-CM | POA: Insufficient documentation

## 2011-08-05 ENCOUNTER — Encounter: Payer: Self-pay | Admitting: Internal Medicine

## 2011-08-05 ENCOUNTER — Ambulatory Visit (INDEPENDENT_AMBULATORY_CARE_PROVIDER_SITE_OTHER): Payer: BC Managed Care – PPO | Admitting: Internal Medicine

## 2011-08-05 VITALS — BP 110/84 | HR 97 | Temp 98.7°F | Ht 70.0 in | Wt 241.2 lb

## 2011-08-05 DIAGNOSIS — Z23 Encounter for immunization: Secondary | ICD-10-CM

## 2011-08-05 DIAGNOSIS — Z0000 Encounter for general adult medical examination without abnormal findings: Secondary | ICD-10-CM

## 2011-08-05 DIAGNOSIS — M5137 Other intervertebral disc degeneration, lumbosacral region: Secondary | ICD-10-CM | POA: Insufficient documentation

## 2011-08-05 DIAGNOSIS — M5090 Cervical disc disorder, unspecified, unspecified cervical region: Secondary | ICD-10-CM | POA: Insufficient documentation

## 2011-08-05 DIAGNOSIS — M519 Unspecified thoracic, thoracolumbar and lumbosacral intervertebral disc disorder: Secondary | ICD-10-CM

## 2011-08-05 HISTORY — DX: Unspecified thoracic, thoracolumbar and lumbosacral intervertebral disc disorder: M51.9

## 2011-08-05 NOTE — Progress Notes (Signed)
Addended by: Scharlene Gloss B on: 08/05/2011 11:42 AM   Modules accepted: Orders

## 2011-08-05 NOTE — Progress Notes (Signed)
Subjective:    Patient ID: Todd Barrett, male    DOB: 12-19-1950, 60 y.o.   MRN: [id number]  HPI Here for wellness and f/u;  Overall doing ok;  Pt denies CP, worsening SOB, DOE, wheezing, orthopnea, PND, worsening LE edema, palpitations, dizziness or syncope.  Pt denies neurological change such as new Headache, facial or extremity weakness.  Pt denies polydipsia, polyuria, or low sugar symptoms. Pt states overall good compliance with treatment and medications, good tolerability, and trying to follow lower cholesterol diet.  Pt denies worsening depressive symptoms, suicidal ideation or panic. No fever, wt loss, night sweats, loss of appetite, or other constitutional symptoms.  Pt states good ability with ADL's, low fall risk, home safety reviewed and adequate, no significant changes in hearing or vision, and occasionally active with exercise.  Unfortunately suffered a fall slipping  Back wards to the buttocks which actually exac his cerivcal disc dz, eventually required 2 level cervical fusion July 2011 - Dr Hirsch/NS, complicated by a broken screw that requires second surgury after a vigorous workout.  Also with complete fx left clavicle nov 2010 after fall off the roof.  Mother died last wk with dementia.  Brother has terminal pancreatic cancer, now doing chemo, found after DVT. Past Medical History  Diagnosis Date  . ALLERGIC RHINITIS 01/08/2010  . ANEMIA-NOS 01/08/2010  . ANXIETY 01/08/2010  . CERVICAL RADICULOPATHY, RIGHT 01/08/2010  . CHEST PAIN 01/08/2010  . DIVERTICULOSIS, COLON 01/08/2010  . GOUT 01/08/2010  . HYPERLIPIDEMIA 01/08/2010  . 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 Lumbar disc disease 08/05/2011   Past Surgical History  Procedure Date  . Lumbar disc surgury 02/2002  . Lumbar disc surgury june 2004  . Left knee arthroscopy september 2005  . S/p left tkr april 2006  . S/p left shoulder surgury feb. 2009  . Clavicle facture nov. 2009    100% displacement  . S/p ent  surgury     x 3    reports that he has never smoked. He does not have any smokeless tobacco history on file. He reports that he drinks alcohol. He reports that he does not use illicit drugs. family history includes Lymphoma in his mother. No Known Allergies Current Outpatient Prescriptions on File Prior to Visit  Medication Sig Dispense Refill  . celecoxib (CELEBREX) 200 MG capsule Take 200 mg by mouth 2 (two) times daily.        . fexofenadine (ALLEGRA) 180 MG tablet Take 180 mg by mouth daily.        Marland Kitchen gabapentin (NEURONTIN) 300 MG capsule 1 by mouth 6 times per day       . Multiple Vitamin (MULTIVITAMIN) capsule Take 1 capsule by mouth daily.        . Omega-3 Fatty Acids (FISH OIL) 1000 MG CAPS Take by mouth 2 (two) times daily.         Review of Systems Review of Systems  Constitutional: Negative for diaphoresis, activity change, appetite change and unexpected weight change.  HENT: Negative for hearing loss, ear pain, facial swelling, mouth sores and neck stiffness.   Eyes: Negative for pain, redness and visual disturbance.  Respiratory: Negative for shortness of breath and wheezing.   Cardiovascular: Negative for chest pain and palpitations.  Gastrointestinal: Negative for diarrhea, blood in stool, abdominal distention and rectal pain.  Genitourinary: Negative for hematuria, flank pain and decreased urine volume.  Musculoskeletal: Negative for myalgias and joint swelling.  Skin: Negative for color change and wound.  Neurological:  Negative for syncope and numbness.  Hematological: Negative for adenopathy.  Psychiatric/Behavioral: Negative for hallucinations, self-injury, decreased concentration and agitation.      Objective:   Physical Exam BP 110/84  Pulse 97  Temp(Src) 98.7 F (37.1 C) (Oral)  Ht 5\' 10"  (1.778 m)  Wt 241 lb 4 oz (109.43 kg)  BMI 34.62 kg/m2  SpO2 97% Physical Exam  VS noted Constitutional: Pt is oriented to person, place, and time. Appears  well-developed and well-nourished.  HENT:  Head: Normocephalic and atraumatic.  Right Ear: External ear normal.  Left Ear: External ear normal.  Nose: Nose normal.  Mouth/Throat: Oropharynx is clear and moist.  Eyes: Conjunctivae and EOM are normal. Pupils are equal, round, and reactive to light.  Neck: Normal range of motion. Neck supple. No JVD present. No tracheal deviation present.  Cardiovascular: Normal rate, regular rhythm, normal heart sounds and intact distal pulses.   Pulmonary/Chest: Effort normal and breath sounds normal.  Abdominal: Soft. Bowel sounds are normal. There is no tenderness.  Musculoskeletal: Normal range of motion. Exhibits no edema.  Lymphadenopathy:  Has no cervical adenopathy.  Neurological: Pt is alert and oriented to person, place, and time. Pt has normal reflexes. No cranial nerve deficit.  Skin: Skin is warm and dry. No rash noted.  Psychiatric:  Has  normal mood and affect. Behavior is normal. Not depressed affect    Assessment & Plan:

## 2011-08-05 NOTE — Patient Instructions (Signed)
You had the flu shot today Continue all other medications as before Please return in 1 year for your yearly visit, or sooner if needed, with Lab testing done 3-5 days before

## 2011-08-05 NOTE — Assessment & Plan Note (Signed)

## 2012-01-13 ENCOUNTER — Encounter: Payer: Self-pay | Admitting: Internal Medicine

## 2012-01-23 ENCOUNTER — Encounter: Payer: Self-pay | Admitting: Internal Medicine

## 2012-02-19 ENCOUNTER — Ambulatory Visit (AMBULATORY_SURGERY_CENTER): Payer: BC Managed Care – PPO

## 2012-02-19 ENCOUNTER — Encounter: Payer: Self-pay | Admitting: Internal Medicine

## 2012-02-19 VITALS — Ht 70.0 in | Wt 234.0 lb

## 2012-02-19 DIAGNOSIS — Z860100 Personal history of colon polyps, unspecified: Secondary | ICD-10-CM

## 2012-02-19 MED ORDER — PEG-KCL-NACL-NASULF-NA ASC-C 100 G PO SOLR: 1.0000 | Freq: Once | ORAL | Status: DC

## 2012-03-04 ENCOUNTER — Encounter: Payer: Self-pay | Admitting: Internal Medicine

## 2012-03-04 ENCOUNTER — Ambulatory Visit (AMBULATORY_SURGERY_CENTER): Payer: BC Managed Care – PPO | Admitting: Internal Medicine

## 2012-03-04 VITALS — BP 126/77 | HR 73 | Temp 97.0°F | Resp 16 | Ht 70.0 in | Wt 234.0 lb

## 2012-03-04 DIAGNOSIS — Z860100 Personal history of colon polyps, unspecified: Secondary | ICD-10-CM

## 2012-03-04 DIAGNOSIS — Z1211 Encounter for screening for malignant neoplasm of colon: Secondary | ICD-10-CM

## 2012-03-04 MED ORDER — SODIUM CHLORIDE 0.9 % IV SOLN: 500.0000 mL | INTRAVENOUS | Status: DC

## 2012-03-04 NOTE — Op Note (Signed)
[facility] [address] [place], [place]  [address]  COLONOSCOPY PROCEDURE REPORT  PATIENT:  Todd Barrett, Todd Barrett  MR#:  [id number] BIRTHDATE:  [birth year], 60 yrs. old  GENDER:  male ENDOSCOPIST:  Wilhemina Bonito. Eda Keys, MD REF. BY:  Surveillance Program Recall, PROCEDURE DATE:  03/04/2012 PROCEDURE:  Surveillance Colonoscopy ASA CLASS:  Class II INDICATIONS:  history of pre-cancerous (adenomatous) colon polyps, surveillance and high-risk screening ; INDEX 09-2003 (TAs), f/u 02-2007 MEDICATIONS:   MAC sedation, administered by CRNA, propofol (Diprivan) 400 mg IV  DESCRIPTION OF PROCEDURE:   After the risks benefits and alternatives of the procedure were thoroughly explained, informed consent was obtained.  Digital rectal exam was performed and revealed no abnormalities.   The LB 180AL [id number] endoscope was introduced through the anus and advanced to the cecum, which was identified by both the appendix and ileocecal valve, without limitations.  The quality of the prep was good, using MoviPrep. The instrument was then slowly withdrawn as the colon was fully examined. <<PROCEDUREIMAGES>>  FINDINGS:  Moderate diverticulosis was found throughout the colon. Otherwise normal colonoscopy without  polyps, masses, vascular ectasias, or inflammatory changes.   Retroflexed views in the rectum revealed no abnormalities.    The time to cecum = 4 minutes. The scope was then withdrawn in  12  minutes from the cecum and the procedure completed.  COMPLICATIONS:  None  ENDOSCOPIC IMPRESSION: 1) Moderate diverticulosis throughout the colon 2) Otherwise normal colonoscopy  RECOMMENDATIONS: 1) Follow up colonoscopy in 5 years  ______________________________ Wilhemina Bonito. Eda Keys, MD  CC:  Corwin Levins, MD;  The Patient  n. eSIGNED:   Wilhemina Bonito. Eda Keys at 03/04/2012 03:38 PM  Meliton Rattan, [phone]

## 2012-03-04 NOTE — Progress Notes (Signed)
Pressure applied to abdomen to reach cecum.  

## 2012-03-04 NOTE — Patient Instructions (Signed)
YOU HAD AN ENDOSCOPIC PROCEDURE TODAY AT THE [facility]: Refer to the procedure report that was given to you for any specific questions about what was found during the examination.  If the procedure report does not answer your questions, please call your gastroenterologist to clarify.  If you requested that your care partner not be given the details of your procedure findings, then the procedure report has been included in a sealed envelope for you to review at your convenience later.  YOU SHOULD EXPECT: Some feelings of bloating in the abdomen. Passage of more gas than usual.  Walking can help get rid of the air that was put into your GI tract during the procedure and reduce the bloating. If you had a lower endoscopy (such as a colonoscopy or flexible sigmoidoscopy) you may notice spotting of blood in your stool or on the toilet paper. If you underwent a bowel prep for your procedure, then you may not have a normal bowel movement for a few days.  DIET: Your first meal following the procedure should be a light meal and then it is ok to progress to your normal diet.  A half-sandwich or bowl of soup is an example of a good first meal.  Heavy or fried foods are harder to digest and may make you feel nauseous or bloated.  Likewise meals heavy in dairy and vegetables can cause extra gas to form and this can also increase the bloating.  Drink plenty of fluids but you should avoid alcoholic beverages for 24 hours.  ACTIVITY: Your care partner should take you home directly after the procedure.  You should plan to take it easy, moving slowly for the rest of the day.  You can resume normal activity the day after the procedure however you should NOT DRIVE or use heavy machinery for 24 hours (because of the sedation medicines used during the test).    SYMPTOMS TO REPORT IMMEDIATELY: A gastroenterologist can be reached at any hour.  During normal business hours, 8:30 AM to 5:00 PM Monday through Friday,  call [phone].  After hours and on weekends, please call the GI answering service at [phone] who will take a message and have the physician on call contact you.   Following lower endoscopy (colonoscopy or flexible sigmoidoscopy):  Excessive amounts of blood in the stool  Significant tenderness or worsening of abdominal pains  Swelling of the abdomen that is new, acute  Fever of 100F or higher  Following upper endoscopy (EGD)  Vomiting of blood or coffee ground material  New chest pain or pain under the shoulder blades  Painful or persistently difficult swallowing  New shortness of breath  Fever of 100F or higher  Black, tarry-looking stools  FOLLOW UP: If any biopsies were taken you will be contacted by phone or by letter within the next 1-3 weeks.  Call your gastroenterologist if you have not heard about the biopsies in 3 weeks.  Our staff will call the home number listed on your records the next business day following your procedure to check on you and address any questions or concerns that you may have at that time regarding the information given to you following your procedure. This is a courtesy call and so if there is no answer at the home number and we have not heard from you through the emergency physician on call, we will assume that you have returned to your regular daily activities without incident.  SIGNATURES/CONFIDENTIALITY: You and/or your care   partner have signed paperwork which will be entered into your electronic medical record.  These signatures attest to the fact that that the information above on your After Visit Summary has been reviewed and is understood.  Full responsibility of the confidentiality of this discharge information lies with you and/or your care-partner.  

## 2012-03-04 NOTE — Progress Notes (Signed)
Patient did not have preoperative order for IV antibiotic SSI prophylaxis. (G8918)  Patient did not experience any of the following events: a burn prior to discharge; a fall within the facility; wrong site/side/patient/procedure/implant event; or a hospital transfer or hospital admission upon discharge from the facility. (G8907)  

## 2012-03-05 ENCOUNTER — Telehealth: Payer: Self-pay | Admitting: *Deleted

## 2012-03-05 NOTE — Telephone Encounter (Signed)
No answer. Identifier on machine. Message left to return call if questions or concerns.

## 2012-07-15 ENCOUNTER — Other Ambulatory Visit: Payer: Self-pay | Admitting: Neurosurgery

## 2012-07-17 ENCOUNTER — Encounter (HOSPITAL_COMMUNITY): Payer: Self-pay | Admitting: Pharmacy Technician

## 2012-07-20 ENCOUNTER — Encounter (HOSPITAL_COMMUNITY): Payer: Self-pay

## 2012-07-20 ENCOUNTER — Encounter (HOSPITAL_COMMUNITY)
Admission: RE | Admit: 2012-07-20 | Discharge: 2012-07-20 | Disposition: A | Discharge status: Home / Self Care | Payer: BC Managed Care – PPO | Source: Ambulatory Visit | Attending: Neurosurgery | Admitting: Neurosurgery

## 2012-07-20 HISTORY — DX: Shortness of breath: R06.02

## 2012-07-20 HISTORY — DX: Gastro-esophageal reflux disease without esophagitis: K21.9

## 2012-07-20 LAB — CBC
MCH: 31.2 pg (ref 26.0–34.0)
MCHC: 36.1 g/dL — ABNORMAL HIGH (ref 30.0–36.0)
MCV: 86.6 fL (ref 78.0–100.0)
Platelets: 157 10*3/uL (ref 150–400)
RDW: 12.9 % (ref 11.5–15.5)

## 2012-07-20 LAB — SURGICAL PCR SCREEN: MRSA, PCR: NEGATIVE

## 2012-07-20 NOTE — Pre-Procedure Instructions (Signed)
20 Todd Barrett  07/20/2012   Your procedure is scheduled on:  Thursday, August 29th.  Report to [facility] at 11:25 AM.  Call this number if you have problems the morning of surgery: [phone]   Remember:   Do not eat food or drink any liquid:After Midnight.     Take these medicines the morning of surgery with A SIP OF WATER: Gabapentin (Neurontin), Fexofenadine (Allerga), Cyclobenzarine (Flexeril).   May use Fexofenadine (Flonase).  Stop taking Celecoxib (Celebrex) and Fish Oil.  Do not take Aspirin, NSAIDs or Herbal medications.   Do not wear jewelry, make-up or nail polish.  Do not wear lotions, powders, or perfumes. You may wear deodorant.  Do not shave 48 hours prior to surgery. Men may shave face and neck.  Do not bring valuables to the hospital.  Contacts, dentures or bridgework may not be worn into surgery.  Leave suitcase in the car. After surgery it may be brought to your room.  For patients admitted to the hospital, checkout time is 11:00 AM the day of discharge.   Patients discharged the day of surgery will not be allowed to drive home.  Name and phone number of your driver: NA   Special Instructions: CHG Shower Use Special Wash: 1/2 bottle night before surgery and 1/2 bottle morning of surgery.   Please read over the following fact sheets that you were given: Pain Booklet, Coughing and Deep Breathing and Surgical Site Infection Prevention

## 2012-07-20 NOTE — Progress Notes (Signed)
Pt does not see a cardiologist.  Did have a stress test 12/15/10 , ? chest pain versus neck pain, stress test was normal.  Pt denies any recent chest pain.

## 2012-07-23 ENCOUNTER — Encounter (HOSPITAL_COMMUNITY): Payer: Self-pay | Admitting: Anesthesiology

## 2012-07-23 ENCOUNTER — Inpatient Hospital Stay (HOSPITAL_COMMUNITY)
Admission: RE | Admit: 2012-07-23 | Discharge: 2012-07-24 | DRG: 864 | Disposition: A | Discharge status: Home / Self Care | Payer: BC Managed Care – PPO | Source: Ambulatory Visit | Attending: Neurosurgery | Admitting: Neurosurgery

## 2012-07-23 ENCOUNTER — Encounter (HOSPITAL_COMMUNITY)
Admission: RE | Disposition: A | Discharge status: Home / Self Care | Payer: Self-pay | Source: Ambulatory Visit | Attending: Neurosurgery

## 2012-07-23 ENCOUNTER — Ambulatory Visit (HOSPITAL_COMMUNITY): Payer: BC Managed Care – PPO | Admitting: Anesthesiology

## 2012-07-23 ENCOUNTER — Ambulatory Visit (HOSPITAL_COMMUNITY): Payer: BC Managed Care – PPO

## 2012-07-23 DIAGNOSIS — T84498A Other mechanical complication of other internal orthopedic devices, implants and grafts, initial encounter: Principal | ICD-10-CM | POA: Diagnosis present

## 2012-07-23 DIAGNOSIS — Y838 Other surgical procedures as the cause of abnormal reaction of the patient, or of later complication, without mention of misadventure at the time of the procedure: Secondary | ICD-10-CM | POA: Diagnosis present

## 2012-07-23 DIAGNOSIS — M4712 Other spondylosis with myelopathy, cervical region: Secondary | ICD-10-CM | POA: Diagnosis present

## 2012-07-23 DIAGNOSIS — K219 Gastro-esophageal reflux disease without esophagitis: Secondary | ICD-10-CM | POA: Diagnosis present

## 2012-07-23 DIAGNOSIS — F411 Generalized anxiety disorder: Secondary | ICD-10-CM | POA: Diagnosis present

## 2012-07-23 DIAGNOSIS — Z01812 Encounter for preprocedural laboratory examination: Secondary | ICD-10-CM

## 2012-07-23 DIAGNOSIS — M5000 Cervical disc disorder with myelopathy, unspecified cervical region: Secondary | ICD-10-CM | POA: Diagnosis present

## 2012-07-23 HISTORY — PX: ANTERIOR CERVICAL DECOMP/DISCECTOMY FUSION: SHX1161

## 2012-07-23 LAB — URINALYSIS, ROUTINE W REFLEX MICROSCOPIC
Glucose, UA: NEGATIVE mg/dL
Hgb urine dipstick: NEGATIVE
Ketones, ur: NEGATIVE mg/dL
Leukocytes, UA: NEGATIVE
Protein, ur: NEGATIVE mg/dL
Urobilinogen, UA: 0.2 mg/dL (ref 0.0–1.0)

## 2012-07-23 LAB — CBC WITH DIFFERENTIAL/PLATELET
Basophils Absolute: 0 10*3/uL (ref 0.0–0.1)
Basophils Relative: 1 % (ref 0–1)
HCT: 42.4 % (ref 39.0–52.0)
MCHC: 35.6 g/dL (ref 30.0–36.0)
Monocytes Absolute: 0.5 10*3/uL (ref 0.1–1.0)
Neutro Abs: 3.8 10*3/uL (ref 1.7–7.7)
Platelets: 143 10*3/uL — ABNORMAL LOW (ref 150–400)
RDW: 13.1 % (ref 11.5–15.5)
WBC: 6.4 10*3/uL (ref 4.0–10.5)

## 2012-07-23 LAB — BASIC METABOLIC PANEL
BUN: 15 mg/dL (ref 6–23)
Calcium: 9.8 mg/dL (ref 8.4–10.5)
Chloride: 104 mEq/L (ref 96–112)
Creatinine, Ser: 1 mg/dL (ref 0.50–1.35)
GFR calc Af Amer: >90 mL/min (ref >90)
GFR calc non Af Amer: 79 mL/min — ABNORMAL LOW (ref >90)

## 2012-07-23 LAB — PROTIME-INR: Prothrombin Time: 13.7 seconds (ref 11.6–15.2)

## 2012-07-23 LAB — APTT: aPTT: 32 seconds (ref 24–37)

## 2012-07-23 SURGERY — ANTERIOR CERVICAL DECOMPRESSION/DISCECTOMY FUSION 2 LEVELS
Anesthesia: General | Site: Neck | Wound class: Clean

## 2012-07-23 MED ORDER — OXYCODONE-ACETAMINOPHEN 5-325 MG PO TABS
1.0000 | ORAL_TABLET | ORAL | Status: DC | PRN
  Administered 2012-07-24: 2 via ORAL
  Filled 2012-07-23: qty 2

## 2012-07-23 MED ORDER — ADULT MULTIVITAMIN W/MINERALS CH
1.0000 | ORAL_TABLET | Freq: Every day | ORAL | Status: DC
  Filled 2012-07-23: qty 1

## 2012-07-23 MED ORDER — HYDROMORPHONE HCL PF 1 MG/ML IJ SOLN
0.2500 mg | INTRAMUSCULAR | Status: DC | PRN
  Administered 2012-07-23 (×5): 0.5 mg via INTRAVENOUS

## 2012-07-23 MED ORDER — LACTATED RINGERS IV SOLN
INTRAVENOUS | Status: DC
  Administered 2012-07-23: 500 mL via INTRAVENOUS

## 2012-07-23 MED ORDER — ACETAMINOPHEN 650 MG RE SUPP: 650.0000 mg | RECTAL | Status: DC | PRN

## 2012-07-23 MED ORDER — 0.9 % SODIUM CHLORIDE (POUR BTL) OPTIME
TOPICAL | Status: DC | PRN
  Administered 2012-07-23: 1000 mL

## 2012-07-23 MED ORDER — PROPOFOL 10 MG/ML IV BOLUS
INTRAVENOUS | Status: DC | PRN
  Administered 2012-07-23: 20 mg via INTRAVENOUS
  Administered 2012-07-23: 200 mg via INTRAVENOUS
  Administered 2012-07-23 (×2): 30 mg via INTRAVENOUS
  Administered 2012-07-23: 20 mg via INTRAVENOUS

## 2012-07-23 MED ORDER — ONDANSETRON HCL 4 MG/2ML IJ SOLN: 4.0000 mg | INTRAMUSCULAR | Status: DC | PRN

## 2012-07-23 MED ORDER — ACETAMINOPHEN 325 MG PO TABS: 650.0000 mg | ORAL_TABLET | ORAL | Status: DC | PRN

## 2012-07-23 MED ORDER — SODIUM CHLORIDE 0.9 % IV SOLN
INTRAVENOUS | Status: AC
  Filled 2012-07-23: qty 500

## 2012-07-23 MED ORDER — SODIUM CHLORIDE 0.9 % IJ SOLN
3.0000 mL | Freq: Two times a day (BID) | INTRAMUSCULAR | Status: DC
  Administered 2012-07-23: 3 mL via INTRAVENOUS

## 2012-07-23 MED ORDER — NEOSTIGMINE METHYLSULFATE 1 MG/ML IJ SOLN
INTRAMUSCULAR | Status: DC | PRN
  Administered 2012-07-23: 4 mg via INTRAVENOUS

## 2012-07-23 MED ORDER — LACTATED RINGERS IV SOLN
INTRAVENOUS | Status: DC | PRN
  Administered 2012-07-23 (×3): via INTRAVENOUS

## 2012-07-23 MED ORDER — MORPHINE SULFATE 2 MG/ML IJ SOLN
1.0000 mg | INTRAMUSCULAR | Status: DC | PRN
  Administered 2012-07-23: 4 mg via INTRAVENOUS
  Filled 2012-07-23: qty 2

## 2012-07-23 MED ORDER — PROMETHAZINE HCL 25 MG/ML IJ SOLN: 12.5000 mg | INTRAMUSCULAR | Status: DC | PRN

## 2012-07-23 MED ORDER — CEFAZOLIN SODIUM 1-5 GM-% IV SOLN
1.0000 g | Freq: Three times a day (TID) | INTRAVENOUS | Status: DC
  Administered 2012-07-23 – 2012-07-24 (×2): 1 g via INTRAVENOUS
  Filled 2012-07-23 (×3): qty 50

## 2012-07-23 MED ORDER — KETOROLAC TROMETHAMINE 30 MG/ML IJ SOLN
30.0000 mg | Freq: Four times a day (QID) | INTRAMUSCULAR | Status: DC
  Administered 2012-07-23 – 2012-07-24 (×2): 30 mg via INTRAVENOUS
  Filled 2012-07-23 (×4): qty 1

## 2012-07-23 MED ORDER — HYDROMORPHONE HCL PF 1 MG/ML IJ SOLN
INTRAMUSCULAR | Status: AC
  Filled 2012-07-23: qty 1

## 2012-07-23 MED ORDER — MENTHOL 3 MG MT LOZG: 1.0000 | LOZENGE | OROMUCOSAL | Status: DC | PRN

## 2012-07-23 MED ORDER — DOCUSATE SODIUM 100 MG PO CAPS
100.0000 mg | ORAL_CAPSULE | Freq: Two times a day (BID) | ORAL | Status: DC
  Administered 2012-07-23: 100 mg via ORAL
  Filled 2012-07-23: qty 1

## 2012-07-23 MED ORDER — HYDROMORPHONE HCL PF 1 MG/ML IJ SOLN
INTRAMUSCULAR | Status: AC
  Administered 2012-07-23: 0.5 mg via INTRAVENOUS
  Filled 2012-07-23: qty 1

## 2012-07-23 MED ORDER — MIDAZOLAM HCL 2 MG/2ML IJ SOLN: 1.0000 mg | INTRAMUSCULAR | Status: DC | PRN

## 2012-07-23 MED ORDER — LIDOCAINE-EPINEPHRINE 1 %-1:100000 IJ SOLN
INTRAMUSCULAR | Status: DC | PRN
  Administered 2012-07-23: 10 mL

## 2012-07-23 MED ORDER — SODIUM CHLORIDE 0.9 % IJ SOLN: 3.0000 mL | INTRAMUSCULAR | Status: DC | PRN

## 2012-07-23 MED ORDER — FLUTICASONE PROPIONATE 50 MCG/ACT NA SUSP: 2.0000 | Freq: Every day | NASAL | Status: DC | PRN

## 2012-07-23 MED ORDER — ALBUMIN HUMAN 5 % IV SOLN
INTRAVENOUS | Status: DC | PRN
  Administered 2012-07-23: 15:00:00 via INTRAVENOUS

## 2012-07-23 MED ORDER — ROCURONIUM BROMIDE 100 MG/10ML IV SOLN
INTRAVENOUS | Status: DC | PRN
  Administered 2012-07-23: 50 mg via INTRAVENOUS
  Administered 2012-07-23: 20 mg via INTRAVENOUS
  Administered 2012-07-23: 10 mg via INTRAVENOUS
  Administered 2012-07-23: 20 mg via INTRAVENOUS

## 2012-07-23 MED ORDER — MAGNESIUM HYDROXIDE 400 MG/5ML PO SUSP: 30.0000 mL | Freq: Every day | ORAL | Status: DC | PRN

## 2012-07-23 MED ORDER — SODIUM CHLORIDE 0.9 % IR SOLN
Status: DC | PRN
  Administered 2012-07-23: 14:00:00

## 2012-07-23 MED ORDER — FENTANYL CITRATE 0.05 MG/ML IJ SOLN
INTRAMUSCULAR | Status: DC | PRN
  Administered 2012-07-23: 100 ug via INTRAVENOUS
  Administered 2012-07-23: 50 ug via INTRAVENOUS
  Administered 2012-07-23: 100 ug via INTRAVENOUS
  Administered 2012-07-23 (×3): 50 ug via INTRAVENOUS
  Administered 2012-07-23: 100 ug via INTRAVENOUS

## 2012-07-23 MED ORDER — CYCLOBENZAPRINE HCL 10 MG PO TABS
10.0000 mg | ORAL_TABLET | Freq: Three times a day (TID) | ORAL | Status: DC | PRN
  Administered 2012-07-24: 10 mg via ORAL
  Filled 2012-07-23: qty 1

## 2012-07-23 MED ORDER — GLYCOPYRROLATE 0.2 MG/ML IJ SOLN
INTRAMUSCULAR | Status: DC | PRN
  Administered 2012-07-23: 0.6 mg via INTRAVENOUS

## 2012-07-23 MED ORDER — MULTIVITAMINS PO CAPS: 1.0000 | ORAL_CAPSULE | Freq: Every day | ORAL | Status: DC

## 2012-07-23 MED ORDER — ZOLPIDEM TARTRATE 5 MG PO TABS: 5.0000 mg | ORAL_TABLET | Freq: Every evening | ORAL | Status: DC | PRN

## 2012-07-23 MED ORDER — ACETAMINOPHEN 10 MG/ML IV SOLN
INTRAVENOUS | Status: AC
  Filled 2012-07-23: qty 100

## 2012-07-23 MED ORDER — VECURONIUM BROMIDE 10 MG IV SOLR
INTRAVENOUS | Status: DC | PRN
  Administered 2012-07-23 (×3): 2 mg via INTRAVENOUS
  Administered 2012-07-23: 1 mg via INTRAVENOUS
  Administered 2012-07-23: 3 mg via INTRAVENOUS

## 2012-07-23 MED ORDER — LORATADINE 10 MG PO TABS
10.0000 mg | ORAL_TABLET | Freq: Every day | ORAL | Status: DC
  Filled 2012-07-23: qty 1

## 2012-07-23 MED ORDER — ONDANSETRON HCL 4 MG/2ML IJ SOLN: INTRAMUSCULAR | Status: DC | PRN

## 2012-07-23 MED ORDER — MORPHINE SULFATE 10 MG/ML IJ SOLN
INTRAMUSCULAR | Status: DC | PRN
  Administered 2012-07-23 (×5): 2 mg via INTRAVENOUS

## 2012-07-23 MED ORDER — LABETALOL HCL 5 MG/ML IV SOLN
INTRAVENOUS | Status: DC | PRN
  Administered 2012-07-23 (×2): 5 mg via INTRAVENOUS

## 2012-07-23 MED ORDER — HYDROCODONE-ACETAMINOPHEN 5-325 MG PO TABS: 1.0000 | ORAL_TABLET | ORAL | Status: DC | PRN

## 2012-07-23 MED ORDER — CELECOXIB 200 MG PO CAPS
200.0000 mg | ORAL_CAPSULE | Freq: Every day | ORAL | Status: DC
  Filled 2012-07-23: qty 1

## 2012-07-23 MED ORDER — PROMETHAZINE HCL 25 MG PO TABS: 12.5000 mg | ORAL_TABLET | ORAL | Status: DC | PRN

## 2012-07-23 MED ORDER — THROMBIN 5000 UNITS EX KIT
PACK | CUTANEOUS | Status: DC | PRN
  Administered 2012-07-23 (×2): 5000 [IU] via TOPICAL

## 2012-07-23 MED ORDER — MIDAZOLAM HCL 5 MG/5ML IJ SOLN
INTRAMUSCULAR | Status: DC | PRN
  Administered 2012-07-23: 2 mg via INTRAVENOUS

## 2012-07-23 MED ORDER — CEFAZOLIN SODIUM-DEXTROSE 2-3 GM-% IV SOLR
2.0000 g | INTRAVENOUS | Status: AC
  Administered 2012-07-23: 2 g via INTRAVENOUS
  Filled 2012-07-23: qty 50

## 2012-07-23 MED ORDER — BACITRACIN 50000 UNITS IM SOLR
INTRAMUSCULAR | Status: AC
  Filled 2012-07-23: qty 1

## 2012-07-23 MED ORDER — DEXAMETHASONE SODIUM PHOSPHATE 4 MG/ML IJ SOLN
INTRAMUSCULAR | Status: DC | PRN
  Administered 2012-07-23: 10 mg via INTRAVENOUS

## 2012-07-23 MED ORDER — PHENOL 1.4 % MT LIQD: 1.0000 | OROMUCOSAL | Status: DC | PRN

## 2012-07-23 MED ORDER — HEMOSTATIC AGENTS (NO CHARGE) OPTIME
TOPICAL | Status: DC | PRN
  Administered 2012-07-23: 1 via TOPICAL

## 2012-07-23 MED ORDER — LIDOCAINE HCL (CARDIAC) 20 MG/ML IV SOLN
INTRAVENOUS | Status: DC | PRN
  Administered 2012-07-23: 100 mg via INTRAVENOUS

## 2012-07-23 MED ORDER — BISACODYL 10 MG RE SUPP: 10.0000 mg | Freq: Every day | RECTAL | Status: DC | PRN

## 2012-07-23 MED ORDER — METHOCARBAMOL 500 MG PO TABS: 500.0000 mg | ORAL_TABLET | Freq: Four times a day (QID) | ORAL | Status: DC | PRN

## 2012-07-23 MED ORDER — SODIUM CHLORIDE 0.9 % IV SOLN: 250.0000 mL | INTRAVENOUS | Status: DC

## 2012-07-23 MED ORDER — PROMETHAZINE HCL 25 MG/ML IJ SOLN: 6.2500 mg | INTRAMUSCULAR | Status: DC | PRN

## 2012-07-23 MED ORDER — FENTANYL CITRATE 0.05 MG/ML IJ SOLN: 50.0000 ug | INTRAMUSCULAR | Status: DC | PRN

## 2012-07-23 MED ORDER — GABAPENTIN 600 MG PO TABS
600.0000 mg | ORAL_TABLET | Freq: Three times a day (TID) | ORAL | Status: DC
  Administered 2012-07-23: 600 mg via ORAL
  Filled 2012-07-23 (×4): qty 1

## 2012-07-23 MED ORDER — ONDANSETRON HCL 4 MG/2ML IJ SOLN
INTRAMUSCULAR | Status: DC | PRN
  Administered 2012-07-23: 4 mg via INTRAVENOUS

## 2012-07-23 MED ORDER — PROPOFOL 10 MG/ML IV EMUL
INTRAVENOUS | Status: DC | PRN
  Administered 2012-07-23: 200 mg via INTRAVENOUS

## 2012-07-23 MED ORDER — METHOCARBAMOL 100 MG/ML IJ SOLN
500.0000 mg | Freq: Four times a day (QID) | INTRAVENOUS | Status: DC | PRN
  Filled 2012-07-23: qty 5

## 2012-07-23 MED ORDER — ACETAMINOPHEN 10 MG/ML IV SOLN
1000.0000 mg | Freq: Once | INTRAVENOUS | Status: AC
  Administered 2012-07-23: 1000 mg via INTRAVENOUS

## 2012-07-23 SURGICAL SUPPLY — 53 items
BAG DECANTER FOR FLEXI CONT (MISCELLANEOUS) ×2 IMPLANT
BANDAGE GAUZE ELAST BULKY 4 IN (GAUZE/BANDAGES/DRESSINGS) ×4 IMPLANT
BENZOIN TINCTURE PRP APPL 2/3 (GAUZE/BANDAGES/DRESSINGS) ×2 IMPLANT
BIT DRILL 14MM (INSTRUMENTS) ×1 IMPLANT
BONE CERV LORDOTIC 14.5X12X7 (Bone Implant) ×4 IMPLANT
BONE CERV LORDOTIC 14.5X12X8 (Bone Implant) ×2 IMPLANT
BUR MATCHSTICK NEURO 3.0 LAGG (BURR) ×2 IMPLANT
CANISTER SUCTION 2500CC (MISCELLANEOUS) ×2 IMPLANT
CLOTH BEACON ORANGE TIMEOUT ST (SAFETY) ×2 IMPLANT
CONT SPEC 4OZ CLIKSEAL STRL BL (MISCELLANEOUS) ×2 IMPLANT
DRAPE LAPAROTOMY 100X72 PEDS (DRAPES) ×2 IMPLANT
DRAPE MICROSCOPE LEICA (MISCELLANEOUS) ×2 IMPLANT
DRAPE POUCH INSTRU U-SHP 10X18 (DRAPES) ×2 IMPLANT
DRILL 14MM (INSTRUMENTS) ×2
DURAPREP 6ML APPLICATOR 50/CS (WOUND CARE) ×2 IMPLANT
DURASEAL SPINE SEALANT 3ML (MISCELLANEOUS) ×2 IMPLANT
ELECT REM PT RETURN 9FT ADLT (ELECTROSURGICAL) ×2
ELECTRODE REM PT RTRN 9FT ADLT (ELECTROSURGICAL) ×1 IMPLANT
GAUZE SPONGE 4X4 16PLY XRAY LF (GAUZE/BANDAGES/DRESSINGS) ×4 IMPLANT
GLOVE ECLIPSE 7.5 STRL STRAW (GLOVE) ×2 IMPLANT
GLOVE EXAM NITRILE LRG STRL (GLOVE) IMPLANT
GLOVE EXAM NITRILE MD LF STRL (GLOVE) IMPLANT
GLOVE EXAM NITRILE XL STR (GLOVE) IMPLANT
GLOVE EXAM NITRILE XS STR PU (GLOVE) IMPLANT
GOWN BRE IMP SLV AUR LG STRL (GOWN DISPOSABLE) IMPLANT
GOWN BRE IMP SLV AUR XL STRL (GOWN DISPOSABLE) IMPLANT
GOWN STRL REIN 2XL LVL4 (GOWN DISPOSABLE) IMPLANT
HEAD HALTER (SOFTGOODS) ×2 IMPLANT
KIT BASIN OR (CUSTOM PROCEDURE TRAY) ×2 IMPLANT
KIT ROOM TURNOVER OR (KITS) ×2 IMPLANT
NEEDLE HYPO 22GX1.5 SAFETY (NEEDLE) IMPLANT
NEEDLE HYPO 25X1 1.5 SAFETY (NEEDLE) ×2 IMPLANT
NEEDLE SPNL 20GX3.5 QUINCKE YW (NEEDLE) ×2 IMPLANT
NS IRRIG 1000ML POUR BTL (IV SOLUTION) ×2 IMPLANT
PACK LAMINECTOMY NEURO (CUSTOM PROCEDURE TRAY) ×2 IMPLANT
PAD ARMBOARD 7.5X6 YLW CONV (MISCELLANEOUS) ×6 IMPLANT
PATTIES SURGICAL .75X.75 (GAUZE/BANDAGES/DRESSINGS) ×2 IMPLANT
PIN DISTRACTION 14MM (PIN) ×4 IMPLANT
PLATE 16MM (Plate) ×2 IMPLANT
PLATE 40MM (Plate) ×2 IMPLANT
RUBBERBAND STERILE (MISCELLANEOUS) ×4 IMPLANT
SCREW 14MM (Screw) ×18 IMPLANT
SCREW BN 14X4.5XST VA (Screw) ×2 IMPLANT
SPONGE GAUZE 4X4 12PLY (GAUZE/BANDAGES/DRESSINGS) ×2 IMPLANT
SPONGE INTESTINAL PEANUT (DISPOSABLE) ×4 IMPLANT
STRIP CLOSURE SKIN 1/2X4 (GAUZE/BANDAGES/DRESSINGS) ×2 IMPLANT
SUT SILK 2 0 TIES 10X30 (SUTURE) ×2 IMPLANT
SUT VIC AB 3-0 SH 8-18 (SUTURE) ×2 IMPLANT
SYR 20ML ECCENTRIC (SYRINGE) ×2 IMPLANT
TAPE CLOTH SURG 4X10 WHT LF (GAUZE/BANDAGES/DRESSINGS) ×2 IMPLANT
TOWEL OR 17X24 6PK STRL BLUE (TOWEL DISPOSABLE) ×2 IMPLANT
TOWEL OR 17X26 10 PK STRL BLUE (TOWEL DISPOSABLE) ×2 IMPLANT
WATER STERILE IRR 1000ML POUR (IV SOLUTION) ×2 IMPLANT

## 2012-07-23 NOTE — Anesthesia Preprocedure Evaluation (Addendum)
Anesthesia Evaluation  Patient identified by MRN, date of birth, ID band Patient awake    Reviewed: Allergy & Precautions, H&P , NPO status , Patient's Chart, lab work & pertinent test results  Airway Mallampati: II TM Distance: >3 FB Neck ROM: Full    Dental  (+) Teeth Intact and Dental Advisory Given   Pulmonary shortness of breath and with exertion,  breath sounds clear to auscultation        Cardiovascular Rhythm:Regular Rate:Normal     Neuro/Psych Anxiety  Neuromuscular disease    GI/Hepatic GERD-  Medicated and Controlled,  Endo/Other    Renal/GU      Musculoskeletal   Abdominal (+) + obese,  Abdomen: soft. Bowel sounds: normal.  Peds  Hematology   Anesthesia Other Findings   Reproductive/Obstetrics                         Anesthesia Physical Anesthesia Plan  ASA: II  Anesthesia Plan: General   Post-op Pain Management:    Induction: Intravenous  Airway Management Planned: Oral ETT  Additional Equipment:   Intra-op Plan:   Post-operative Plan: Extubation in OR  Informed Consent: I have reviewed the patients History and Physical, chart, labs and discussed the procedure including the risks, benefits and alternatives for the proposed anesthesia with the patient or authorized representative who has indicated his/her understanding and acceptance.     Plan Discussed with: CRNA and Surgeon  Anesthesia Plan Comments:         Anesthesia Quick Evaluation

## 2012-07-23 NOTE — Anesthesia Postprocedure Evaluation (Signed)
  Anesthesia Post-op Note  Patient: Todd Barrett  Procedure(s) Performed: Procedure(s) (LRB): ANTERIOR CERVICAL DECOMPRESSION/DISCECTOMY FUSION 2 LEVELS (N/A)  Patient Location: PACU  Anesthesia Type: General  Level of Consciousness: awake, alert , oriented and patient cooperative  Airway and Oxygen Therapy: Patient Spontanous Breathing and Patient connected to nasal cannula oxygen  Post-op Pain: mild  Post-op Assessment: Post-op Vital signs reviewed, Patient's Cardiovascular Status Stable, Respiratory Function Stable, Patent Airway, No signs of Nausea or vomiting and Pain level controlled  Post-op Vital Signs: stable  Complications: No apparent anesthesia complications

## 2012-07-23 NOTE — H&P (Signed)
See H& P.

## 2012-07-23 NOTE — Anesthesia Procedure Notes (Signed)
Procedure Name: Intubation Date/Time: 07/23/2012 2:00 PM Performed by: Sherie Don Pre-anesthesia Checklist: Emergency Drugs available, Patient identified, Suction available, Patient being monitored and Timeout performed Patient Re-evaluated:Patient Re-evaluated prior to inductionOxygen Delivery Method: Circle system utilized Preoxygenation: Pre-oxygenation with 100% oxygen Intubation Type: IV induction Ventilation: Mask ventilation without difficulty and Oral airway inserted - appropriate to patient size Laryngoscope Size: Mac and 4 Grade View: Grade II Tube type: Oral Tube size: 7.5 mm Airway Equipment and Method: Stylet Placement Confirmation: ETT inserted through vocal cords under direct vision,  positive ETCO2 and breath sounds checked- equal and bilateral Secured at: 23 cm Tube secured with: Tape Dental Injury: Teeth and Oropharynx as per pre-operative assessment

## 2012-07-23 NOTE — Op Note (Signed)
07/23/2012  6:00 PM  PATIENT:  Todd Barrett  61 y.o. 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 Patient to ACF in the past with presumed nonunion at C6-7 with right frontal narrowing and spinal change with severe foraminal narrowing at C3-4-4 5 patient brought in for ACF at the 2 upper levels and exploration of the fusion at the C6-7 level.  Patient brought in from general anesthesia induced patient placed in 10 pounds halter traction prepped draped sterile fashion. Slight incision injected with 10 cc of lidocaine in the site of the previous incision left neck was opened incision taken of the platysma hemostasis obtained with Bovie cauterization. The dissection taken to the intracerebral fascia the intracerebral spine. We exposed the previous plate was from a 5-7 expose the next 2 interspaces above that out. We continually 34 disc space incised the space and a partial discectomy at L4-5 or above that he apply to the marker there and remove the previous plates and took x-rays and this confirmed or positioning. Without been soaking retractors suite C3-4 and L4-5 levels microscope was brought in for microdissection. Discectomy continued with due to rongeurs and a curettes and and then bilateral foraminotomies were done decompress the nerve  roots bilaterally at both levels 3445. We measured height a displaced and tapped in the correspond LifeNet allograft bone was 6 mm at 45 and 5 mm at 34. A Tressel anterior cervical plate was placed with interested in spine 2 screws placed in C3-C4 c5.   Remove the retractor down to the C6-7 level and lower t explored o that area. There was a clear nonunion we removed osteophytes with and then used to drill to drill through the disc space and found some loose pieces of bone all around the scar and this in this do space we drilled in the interspace and then used went to more Kerrison punches to decompress the canal and an extensive foraminotomy the right side at. We did in place we see the area with DuraSeal placed to fiber bone graft and tapped in place and we DuraSeal around over the bone graft. There is no further leak of CSF after this. A Tressel anterior cervical plate cervical plate was placed with anterior surgical spine and 2 screws placed the C6-C7 these were tightened down lateral x-rays obtained showing good position plate-screw bone plugs at 3445 [place] see the shadow of the plate and screw at the C6 to the shoulders. Intraoperative good position of plate and screws. We irrigated with antibiotic solution and hemostasis retractors removed fascia platysma closed through Vicryl interrupted sutures subcutaneous tissue closed the same skin closed benzoin Steri-Strips dressing was placed this was placed Sulzer collar (and transferred to recovery room.  PLAN OF CARE: Admit to inpatient   PATIENT DISPOSITION:  PACU - hemodynamically stable.

## 2012-07-23 NOTE — Preoperative (Signed)
Beta Blockers   Reason not to administer Beta Blockers:Not Applicable 

## 2012-07-23 NOTE — Interval H&P Note (Signed)
History and Physical Interval Note:  07/23/2012 10:33 AM  Todd Barrett  has presented today for surgery, with the diagnosis of Cervical hnp with myelopathy, Cervical spondylosis with myelopathy  The various methods of treatment have been discussed with the patient and family. After consideration of risks, benefits and other options for treatment, the patient has consented to  Procedure(s) (LRB): ANTERIOR CERVICAL DECOMPRESSION/DISCECTOMY FUSION 2 LEVELS (N/A) as a surgical intervention .  The patient's history has been reviewed, patient examined, no change in status, stable for surgery.  I have reviewed the patient's chart and labs.  Questions were answered to the patient's satisfaction.     Todd Barrett R

## 2012-07-23 NOTE — Transfer of Care (Signed)
Immediate Anesthesia Transfer of Care Note  Patient: Todd Barrett  Procedure(s) Performed: Procedure(s) (LRB): ANTERIOR CERVICAL DECOMPRESSION/DISCECTOMY FUSION 2 LEVELS (N/A)  Patient Location: PACU  Anesthesia Type: General  Level of Consciousness: awake, alert , oriented and pateint uncooperative  Airway & Oxygen Therapy: Patient Spontanous Breathing and Patient connected to face mask oxygen  Post-op Assessment: Report given to PACU RN  Post vital signs: Reviewed and stable  Complications: No apparent anesthesia complications

## 2012-07-24 ENCOUNTER — Encounter (HOSPITAL_COMMUNITY): Payer: Self-pay | Admitting: Neurosurgery

## 2012-07-24 MED ORDER — CYCLOBENZAPRINE HCL 10 MG PO TABS: 10.0000 mg | ORAL_TABLET | Freq: Three times a day (TID) | ORAL | Status: AC | PRN

## 2012-07-24 MED ORDER — OXYCODONE-ACETAMINOPHEN 5-325 MG PO TABS: 1.0000 | ORAL_TABLET | ORAL | Status: AC | PRN

## 2012-07-24 NOTE — Progress Notes (Signed)
Utilization review completed. Makail Watling, RN, BSN. 

## 2012-07-24 NOTE — Discharge Summary (Signed)
Physician Discharge Summary  Patient ID: Todd Barrett MRN: [id number] DOB/AGE: 1951-09-20 61 y.o.  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 *    Discharged Condition: good  Hospital Course: pt admitted, had surgery below - pt with less right arm numbness/pain - eating , ambulatin  Consults: None  Significant Diagnostic Studies: none  Treatments: surgery: ANTERIOR CERVICAL DECOMPRESSION/DISCECTOMY FUSION C3-4, C4-5, Explore fusion C6-7, remove plate Z3-0, Redo ACF C6-7 - lifenet bone,2 trestle plates Q6-5 and C6-7      Discharge Exam: Blood pressure 131/82, pulse 87, temperature 98.5 F (36.9 C), temperature source Oral, resp. rate 16, SpO2 93.00%. Wound:c/d/i , voice sl hoarse  Disposition: home  Discharge Orders    Future Appointments: Provider: Department: Dept Phone: Center:   08/06/2012 8:30 AM Todd Levins, MD [facility] [phone] [facility]     Medication List  As of 07/24/2012  8:39 AM   STOP taking these medications         celecoxib 200 MG capsule         TAKE these medications         cyclobenzaprine 5 MG tablet   Commonly known as: FLEXERIL   Take 5 mg by mouth 2 (two) times daily as needed. For sore neck.      cyclobenzaprine 10 MG tablet   Commonly known as: FLEXERIL   Take 1 tablet (10 mg total) by mouth 3 (three) times daily as needed for muscle spasms.      fexofenadine 180 MG tablet   Commonly known as: ALLEGRA   Take 180 mg by mouth daily.      Fish Oil 1000 MG Caps   Take by mouth 2 (two) times daily.      fluticasone 50 MCG/ACT nasal spray   Commonly known as: FLONASE   Place 2 sprays into the nose  daily as needed. For allergies.      gabapentin 600 MG tablet   Commonly known as: NEURONTIN   Take 600 mg by mouth 3 (three) times daily.      multivitamin capsule   Take 1 capsule by mouth daily.      oxyCODONE-acetaminophen 5-325 MG per tablet   Commonly known as: PERCOCET/ROXICET   Take 1-2 tablets by mouth every 4 (four) hours as needed for pain.      potassium gluconate 595 MG Tabs   Take 595 mg by mouth 2 (two) times daily.             SignedClydene Fake, MD 07/24/2012, 8:39 AM

## 2012-07-26 ENCOUNTER — Emergency Department (HOSPITAL_COMMUNITY)
Admission: EM | Admit: 2012-07-26 | Discharge: 2012-07-26 | Disposition: A | Discharge status: Home / Self Care | Payer: BC Managed Care – PPO | Attending: Emergency Medicine | Admitting: Emergency Medicine

## 2012-07-26 ENCOUNTER — Encounter (HOSPITAL_COMMUNITY): Payer: Self-pay | Admitting: Emergency Medicine

## 2012-07-26 DIAGNOSIS — M542 Cervicalgia: Secondary | ICD-10-CM | POA: Insufficient documentation

## 2012-07-26 DIAGNOSIS — Z9889 Other specified postprocedural states: Secondary | ICD-10-CM | POA: Insufficient documentation

## 2012-07-26 DIAGNOSIS — R51 Headache: Secondary | ICD-10-CM | POA: Insufficient documentation

## 2012-07-26 MED ORDER — FENTANYL CITRATE 0.05 MG/ML IJ SOLN
50.0000 ug | Freq: Once | INTRAMUSCULAR | Status: AC
  Administered 2012-07-26: 50 ug via INTRAVENOUS
  Filled 2012-07-26: qty 2

## 2012-07-26 NOTE — ED Notes (Signed)
Pt's wife left and asked to call for updates: Todd Barrett [phone]

## 2012-07-26 NOTE — ED Notes (Signed)
Pt had neck surgery on the 29th and has had increasing neck pain since. Pt took 2 percocet and 1 flexeril prior to EMS arrival.  Pain decreased from 10/10 to 7/10.  Pt now c/o ha

## 2012-07-26 NOTE — ED Notes (Signed)
Walked into pt's room to see pt standing up attempting to remove pulse ox and BP cuff.  Pt stated, "I am ready to go.  I have been here for five hours and no provider has come to see me.  I am in extreme pain and I can die at home."  Nurse attempted to console pt and asked if there was anything that I could do.  He stated, "No.  I'm not mad at you. But I'm not paying for this visit and my doctor will hear about this."  Charge nurse, Reita Cliche, RN - notified.

## 2012-08-06 ENCOUNTER — Encounter: Payer: BC Managed Care – PPO | Admitting: Internal Medicine

## 2012-09-29 ENCOUNTER — Other Ambulatory Visit (INDEPENDENT_AMBULATORY_CARE_PROVIDER_SITE_OTHER): Payer: BC Managed Care – PPO

## 2012-09-29 ENCOUNTER — Telehealth: Payer: Self-pay

## 2012-09-29 DIAGNOSIS — Z0000 Encounter for general adult medical examination without abnormal findings: Secondary | ICD-10-CM

## 2012-09-29 LAB — BASIC METABOLIC PANEL
BUN: 22 mg/dL (ref 6–23)
Calcium: 9.5 mg/dL (ref 8.4–10.5)
Chloride: 103 mEq/L (ref 96–112)
Creatinine, Ser: 1 mg/dL (ref 0.4–1.5)
GFR: 78.68 mL/min (ref >60.00)

## 2012-09-29 LAB — CBC WITH DIFFERENTIAL/PLATELET
Basophils Absolute: 0 10*3/uL (ref 0.0–0.1)
Basophils Relative: 0.6 % (ref 0.0–3.0)
Eosinophils Absolute: 0.1 10*3/uL (ref 0.0–0.7)
HCT: 43.1 % (ref 39.0–52.0)
Hemoglobin: 14.5 g/dL (ref 13.0–17.0)
Lymphs Abs: 1.9 10*3/uL (ref 0.7–4.0)
MCHC: 33.7 g/dL (ref 30.0–36.0)
MCV: 89.2 fl (ref 78.0–100.0)
Monocytes Absolute: 0.6 10*3/uL (ref 0.1–1.0)
Neutro Abs: 3.1 10*3/uL (ref 1.4–7.7)
RBC: 4.83 Mil/uL (ref 4.22–5.81)
RDW: 14.1 % (ref 11.5–14.6)

## 2012-09-29 LAB — TSH: TSH: 1.33 u[IU]/mL (ref 0.35–5.50)

## 2012-09-29 LAB — LIPID PANEL
Cholesterol: 214 mg/dL — ABNORMAL HIGH (ref 0–200)
Triglycerides: 101 mg/dL (ref 0.0–149.0)
VLDL: 20.2 mg/dL (ref 0.0–40.0)

## 2012-09-29 LAB — HEPATIC FUNCTION PANEL
ALT: 34 U/L (ref 0–53)
Total Bilirubin: 0.7 mg/dL (ref 0.3–1.2)

## 2012-09-29 NOTE — Telephone Encounter (Signed)
Put order in for physical labs. 

## 2012-10-05 ENCOUNTER — Ambulatory Visit (INDEPENDENT_AMBULATORY_CARE_PROVIDER_SITE_OTHER): Payer: BC Managed Care – PPO | Admitting: Internal Medicine

## 2012-10-05 ENCOUNTER — Encounter: Payer: Self-pay | Admitting: Internal Medicine

## 2012-10-05 VITALS — BP 110/80 | HR 68 | Temp 97.8°F | Ht 70.0 in | Wt 232.0 lb

## 2012-10-05 DIAGNOSIS — E785 Hyperlipidemia, unspecified: Secondary | ICD-10-CM

## 2012-10-05 DIAGNOSIS — Z0000 Encounter for general adult medical examination without abnormal findings: Secondary | ICD-10-CM

## 2012-10-05 DIAGNOSIS — Z23 Encounter for immunization: Secondary | ICD-10-CM

## 2012-10-05 MED ORDER — ATORVASTATIN CALCIUM 10 MG PO TABS: 10.0000 mg | ORAL_TABLET | Freq: Every day | ORAL | Status: DC

## 2012-10-05 MED ORDER — CELECOXIB 200 MG PO CAPS
200.0000 mg | ORAL_CAPSULE | Freq: Two times a day (BID) | ORAL | Status: DC
Start: 2012-10-05 — End: 2017-12-16

## 2012-10-05 NOTE — Progress Notes (Signed)
Subjective:    Patient ID: Todd Pace., male    DOB: January 09, 1951, 61 y.o.   MRN: [id number]  HPI Here for wellness and f/u;  Overall doing ok;  Pt denies CP, worsening SOB, DOE, wheezing, orthopnea, PND, worsening LE edema, palpitations, dizziness or syncope.  Pt denies neurological change such as new Headache, facial or extremity weakness.  Pt denies polydipsia, polyuria, or low sugar symptoms. Pt states overall good compliance with treatment and medications, good tolerability, and trying to follow lower cholesterol diet.  Pt denies worsening depressive symptoms, suicidal ideation or panic. No fever, wt loss, night sweats, loss of appetite, or other constitutional symptoms.  Pt states good ability with ADL's, low fall risk, home safety reviewed and adequate, no significant changes in hearing or vision, and occasionally active with exercise.  Is s/p cervical spine fusion since last seen;  Overall pain much improved. Has not been as active, not able to lose wt given the neck problem Past Medical History  Diagnosis Date  . ALLERGIC RHINITIS 01/08/2010  . ANEMIA-NOS 01/08/2010  . ANXIETY 01/08/2010  . CERVICAL RADICULOPATHY, RIGHT 01/08/2010  . CHEST PAIN 01/08/2010  . DIVERTICULOSIS, COLON 01/08/2010  . GOUT 01/08/2010  . HYPERLIPIDEMIA 01/08/2010  . 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 Lumbar disc disease 08/05/2011  . Shortness of breath     with exertion  . GERD (gastroesophageal reflux disease)     Zantac OTC  prn   Past Surgical History  Procedure Date  . Lumbar disc surgury 02/2002  . Lumbar disc surgury june 2004  . Left knee arthroscopy september 2005  . S/p left tkr april 2006  . S/p left shoulder surgury feb. 2009  . Clavicle facture nov. 2009    100% displacement  . S/p ent surgury     x 3  . Hernia repair   . Colonoscopy   . Back surgery 03/2004, 02/2005  . Cervical spine surgery 05/2010  . Knee arthroscopy     total of 4  . Nasal reconstruction     x 3  .  Anterior cervical decomp/discectomy fusion 07/23/2012    Procedure: ANTERIOR CERVICAL DECOMPRESSION/DISCECTOMY FUSION 2 LEVELS;  Surgeon: Clydene Fake, MD;  Location: [facility];  Service: Neurosurgery;  Laterality: N/A;  Cervical three-four, four-five, redo Cervical six-seven anterior cervical decompression, discectomy fusion with allograft and plating    reports that he has never smoked. He has never used smokeless tobacco. He reports that he drinks about 7.2 ounces of alcohol per week. He reports that he does not use illicit drugs. family history includes Lymphoma in his mother and Pancreatic cancer in his brother.  There is no history of Colon cancer. Allergies  Allergen Reactions  . Dust Mite Extract Other (See Comments)    Nasal Congestion   Current Outpatient Prescriptions on File Prior to Visit  Medication Sig Dispense Refill  . fexofenadine (ALLEGRA) 180 MG tablet Take 180 mg by mouth daily.       . fluticasone (FLONASE) 50 MCG/ACT nasal spray Place 2 sprays into the nose daily as needed. For allergies.      . Multiple Vitamin (MULTIVITAMIN) capsule Take 1 capsule by mouth daily.        . Omega-3 Fatty Acids (FISH OIL) 1000 MG CAPS Take by mouth 2 (two) times daily.        . potassium gluconate 595 MG TABS Take 595 mg by mouth 2 (two) times daily.       Marland Kitchen  atorvastatin (LIPITOR) 10 MG tablet Take 1 tablet (10 mg total) by mouth daily.  90 tablet  3   Review of Systems Review of Systems  Constitutional: Negative for diaphoresis, activity change, appetite change and unexpected weight change.  HENT: Negative for hearing loss, ear pain, facial swelling, mouth sores and neck stiffness.   Eyes: Negative for pain, redness and visual disturbance.  Respiratory: Negative for shortness of breath and wheezing.   Cardiovascular: Negative for chest pain and palpitations.  Gastrointestinal: Negative for diarrhea, blood in stool, abdominal distention and rectal pain.  Genitourinary: Negative  for hematuria, flank pain and decreased urine volume.  Musculoskeletal: Negative for myalgias and joint swelling.  Skin: Negative for color change and wound.  Neurological: Negative for syncope and numbness.  Hematological: Negative for adenopathy.  Psychiatric/Behavioral: Negative for hallucinations, self-injury, decreased concentration and agitation.      Objective:   Physical Exam BP 110/80  Pulse 68  Temp 97.8 F (36.6 C) (Oral)  Ht 5\' 10"  (1.778 m)  Wt 232 lb (105.235 kg)  BMI 33.29 kg/m2  SpO2 98% Physical Exam  VS noted Constitutional: Pt is oriented to person, place, and time. Appears well-developed and well-nourished.  HENT:  Head: Normocephalic and atraumatic.  Right Ear: External ear normal.  Left Ear: External ear normal.  Nose: Nose normal.  Mouth/Throat: Oropharynx is clear and moist.  Eyes: Conjunctivae and EOM are normal. Pupils are equal, round, and reactive to light.  Neck: Normal range of motion. Neck supple. No JVD present. No tracheal deviation present.  Cardiovascular: Normal rate, regular rhythm, normal heart sounds and intact distal pulses.   Pulmonary/Chest: Effort normal and breath sounds normal.  Abdominal: Soft. Bowel sounds are normal. There is no tenderness.  Musculoskeletal: Normal range of motion. Exhibits no edema.  Lymphadenopathy:  Has no cervical adenopathy.  Neurological: Pt is alert and oriented to person, place, and time. Pt has normal reflexes. No cranial nerve deficit.  Skin: Skin is warm and dry. No rash noted.  Psychiatric:  Has  normal mood and affect. Behavior is normal.     Assessment & Plan:

## 2012-10-05 NOTE — Assessment & Plan Note (Signed)

## 2012-10-05 NOTE — Assessment & Plan Note (Addendum)
Mild uncontrolled, for lipitor 10 mg, lower chol diet, f/u next visit, goal ldl < 100

## 2012-10-05 NOTE — Patient Instructions (Addendum)
You had the flu shot today Take all new medications as prescribed - the generic lipitor Continue all other medications as before Please have the pharmacy call with any refills you may need. Please continue your efforts at being more active, low cholesterol diet, and weight control. Please remember to sign up for My Chart at your earliest convenience, as this will be important to you in the future with finding out test results. Please return in 1 year for your yearly visit, or sooner if needed, with Lab testing done 3-5 days before

## 2013-09-25 ENCOUNTER — Other Ambulatory Visit: Payer: Self-pay | Admitting: Internal Medicine

## 2013-10-04 ENCOUNTER — Other Ambulatory Visit: Payer: Self-pay | Admitting: Internal Medicine

## 2013-11-15 ENCOUNTER — Other Ambulatory Visit: Payer: BC Managed Care – PPO

## 2013-11-16 ENCOUNTER — Telehealth: Payer: Self-pay

## 2013-11-16 ENCOUNTER — Other Ambulatory Visit (INDEPENDENT_AMBULATORY_CARE_PROVIDER_SITE_OTHER): Payer: BC Managed Care – PPO

## 2013-11-16 DIAGNOSIS — Z0000 Encounter for general adult medical examination without abnormal findings: Secondary | ICD-10-CM

## 2013-11-16 LAB — CBC WITH DIFFERENTIAL/PLATELET
Basophils Absolute: 0 10*3/uL (ref 0.0–0.1)
Eosinophils Absolute: 0.2 10*3/uL (ref 0.0–0.7)
HCT: 45.2 % (ref 39.0–52.0)
Lymphs Abs: 1.9 10*3/uL (ref 0.7–4.0)
MCHC: 34 g/dL (ref 30.0–36.0)
MCV: 88.5 fl (ref 78.0–100.0)
Monocytes Absolute: 0.3 10*3/uL (ref 0.1–1.0)
Neutrophils Relative %: 60.4 % (ref 43.0–77.0)
Platelets: 179 10*3/uL (ref 150.0–400.0)
RDW: 13.7 % (ref 11.5–14.6)
WBC: 6.1 10*3/uL (ref 4.5–10.5)

## 2013-11-16 LAB — LIPID PANEL
Cholesterol: 182 mg/dL (ref 0–200)
HDL: 75.7 mg/dL (ref >39.00)
LDL Cholesterol: 91 mg/dL (ref 0–99)
Total CHOL/HDL Ratio: 2
Triglycerides: 76 mg/dL (ref 0.0–149.0)
VLDL: 15.2 mg/dL (ref 0.0–40.0)

## 2013-11-16 LAB — BASIC METABOLIC PANEL
Calcium: 9.6 mg/dL (ref 8.4–10.5)
Creatinine, Ser: 0.9 mg/dL (ref 0.4–1.5)
GFR: 86.14 mL/min (ref >60.00)
Glucose, Bld: 67 mg/dL — ABNORMAL LOW (ref 70–99)
Potassium: 5.2 mEq/L — ABNORMAL HIGH (ref 3.5–5.1)
Sodium: 142 mEq/L (ref 135–145)

## 2013-11-16 LAB — HEPATIC FUNCTION PANEL
ALT: 29 U/L (ref 0–53)
AST: 24 U/L (ref 0–37)
Albumin: 4.5 g/dL (ref 3.5–5.2)
Alkaline Phosphatase: 61 U/L (ref 39–117)
Total Bilirubin: 0.6 mg/dL (ref 0.3–1.2)

## 2013-11-16 NOTE — Telephone Encounter (Signed)
Labs entered.

## 2013-11-24 ENCOUNTER — Ambulatory Visit (INDEPENDENT_AMBULATORY_CARE_PROVIDER_SITE_OTHER): Payer: BC Managed Care – PPO | Admitting: Internal Medicine

## 2013-11-24 ENCOUNTER — Encounter: Payer: Self-pay | Admitting: Internal Medicine

## 2013-11-24 VITALS — BP 140/100 | HR 81 | Temp 98.0°F | Ht 70.0 in | Wt 226.2 lb

## 2013-11-24 DIAGNOSIS — Z23 Encounter for immunization: Secondary | ICD-10-CM

## 2013-11-24 DIAGNOSIS — Z0000 Encounter for general adult medical examination without abnormal findings: Secondary | ICD-10-CM

## 2013-11-24 NOTE — Assessment & Plan Note (Signed)

## 2013-11-24 NOTE — Progress Notes (Signed)
Pre visit review using our clinic review tool, if applicable. No additional management support is needed unless otherwise documented below in the visit note. 

## 2013-11-24 NOTE — Progress Notes (Signed)
Subjective:    Patient ID: Todd Barrett., male    DOB: [birth year], 62 y.o.   MRN: [id number]  HPI  Here for wellness and f/u;  Overall doing ok;  Pt denies CP, worsening SOB, DOE, wheezing, orthopnea, PND, worsening LE edema, palpitations, dizziness or syncope.  Pt denies neurological change such as new headache, facial or extremity weakness.  Pt denies polydipsia, polyuria, or low sugar symptoms. Pt states overall good compliance with treatment and medications, good tolerability, and has been trying to follow lower cholesterol diet.  Pt denies worsening depressive symptoms, suicidal ideation or panic. No fever, night sweats, wt loss, loss of appetite, or other constitutional symptoms.  Pt states good ability with ADL's, has low fall risk, home safety reviewed and adequate, no other significant changes in hearing or vision, and only occasionally active with exercise. Asks for flu shot, and shingles shot but not sure if latter is covered.  Did have gout in right ankle recent now resolved. No other acute complaints.  Checks BP freq at [facility] locally and has BP usually < 140/90, today stressed due to work and rushing here Past Medical History  Diagnosis Date  . ALLERGIC RHINITIS 01/08/2010  . ANEMIA-NOS 01/08/2010  . ANXIETY 01/08/2010  . CERVICAL RADICULOPATHY, RIGHT 01/08/2010  . CHEST PAIN 01/08/2010  . DIVERTICULOSIS, COLON 01/08/2010  . GOUT 01/08/2010  . HYPERLIPIDEMIA 01/08/2010  . 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 Lumbar disc disease 08/05/2011  . Shortness of breath     with exertion  . GERD (gastroesophageal reflux disease)     Zantac OTC  prn   Past Surgical History  Procedure Laterality Date  . Lumbar disc surgury  02/2002  . Lumbar disc surgury  june 2004  . Left knee arthroscopy  september 2005  . S/p left tkr  april 2006  . S/p left shoulder surgury  feb. 2009  . Clavicle facture  nov. 2009    100% displacement  . S/p ent surgury      x 3  . Hernia repair    .  Colonoscopy    . Back surgery  03/2004, 02/2005  . Cervical spine surgery  05/2010  . Knee arthroscopy      total of 4  . Nasal reconstruction      x 3  . Anterior cervical decomp/discectomy fusion  07/23/2012    Procedure: ANTERIOR CERVICAL DECOMPRESSION/DISCECTOMY FUSION 2 LEVELS;  Surgeon: Clydene Fake, MD;  Location: [facility];  Service: Neurosurgery;  Laterality: N/A;  Cervical three-four, four-five, redo Cervical six-seven anterior cervical decompression, discectomy fusion with allograft and plating    reports that he has never smoked. He has never used smokeless tobacco. He reports that he drinks about 7.2 ounces of alcohol per week. He reports that he does not use illicit drugs. family history includes Lymphoma in his mother; Pancreatic cancer in his brother. There is no history of Colon cancer. Allergies  Allergen Reactions  . Dust Mite Extract Other (See Comments)    Nasal Congestion   Current Outpatient Prescriptions on File Prior to Visit  Medication Sig Dispense Refill  . atorvastatin (LIPITOR) 10 MG tablet TAKE 1 TABLET BY MOUTH EVERY DAY  90 tablet  3  . atorvastatin (LIPITOR) 10 MG tablet TAKE 1 TABLET BY MOUTH EVERY DAY  90 tablet  3  . celecoxib (CELEBREX) 200 MG capsule Take 1 capsule (200 mg total) by mouth 2 (two) times daily.  60 capsule  11  .  fexofenadine (ALLEGRA) 180 MG tablet Take 180 mg by mouth daily.       . fluticasone (FLONASE) 50 MCG/ACT nasal spray Place 2 sprays into the nose daily as needed. For allergies.      . Multiple Vitamin (MULTIVITAMIN) capsule Take 1 capsule by mouth daily.        . Omega-3 Fatty Acids (FISH OIL) 1000 MG CAPS Take by mouth 2 (two) times daily.        . potassium gluconate 595 MG TABS Take 595 mg by mouth 2 (two) times daily.        No current facility-administered medications on file prior to visit.    Review of Systems Constitutional: Negative for diaphoresis, activity change, appetite change or unexpected weight change.    HENT: Negative for hearing loss, ear pain, facial swelling, mouth sores and neck stiffness.   Eyes: Negative for pain, redness and visual disturbance.  Respiratory: Negative for shortness of breath and wheezing.   Cardiovascular: Negative for chest pain and palpitations.  Gastrointestinal: Negative for diarrhea, blood in stool, abdominal distention or other pain Genitourinary: Negative for hematuria, flank pain or change in urine volume.  Musculoskeletal: Negative for myalgias and joint swelling.  Skin: Negative for color change and wound.  Neurological: Negative for syncope and numbness. other than noted Hematological: Negative for adenopathy.  Psychiatric/Behavioral: Negative for hallucinations, self-injury, decreased concentration and agitation.      Objective:   Physical Exam BP 140/100  Pulse 81  Temp(Src) 98 F (36.7 C) (Oral)  Ht 5\' 10"  (1.778 m)  Wt 226 lb 4 oz (102.626 kg)  BMI 32.46 kg/m2  SpO2 97% VS noted,  Constitutional: Pt is oriented to person, place, and time. Appears well-developed and well-nourished.  Head: Normocephalic and atraumatic.  Right Ear: External ear normal.  Left Ear: External ear normal.  Nose: Nose normal.  Mouth/Throat: Oropharynx is clear and moist.  Eyes: Conjunctivae and EOM are normal. Pupils are equal, round, and reactive to light.  Neck: Normal range of motion. Neck supple. No JVD present. No tracheal deviation present.  Cardiovascular: Normal rate, regular rhythm, normal heart sounds and intact distal pulses.   Pulmonary/Chest: Effort normal and breath sounds normal.  Abdominal: Soft. Bowel sounds are normal. There is no tenderness. No HSM  Musculoskeletal: Normal range of motion. Exhibits no edema.  Lymphadenopathy:  Has no cervical adenopathy.  Neurological: Pt is alert and oriented to person, place, and time. Pt has normal reflexes. No cranial nerve deficit.  Skin: Skin is warm and dry. No rash noted.  Psychiatric:  Has  normal mood  and affect. Behavior is normal.  Left knee with chronic decr ROM, NT, no effusion       Assessment & Plan:

## 2013-11-24 NOTE — Patient Instructions (Signed)
You had the flu shot today Please call your insurance to see if the shingles shot is covered, and return if so for the shot at a Nurse Visit Otherwise consider going to [facility] or other pharmacy with the prescription Please continue all other medications as before, and refills have been done if requested. Please have the pharmacy call with any other refills you may need. Please continue your efforts at being more active, low cholesterol diet, and weight control. You are otherwise up to date with prevention measures today. Please keep your appointments with your specialists as you may have planned  Please remember to sign up for My Chart if you have not done so, as this will be important to you in the future with finding out test results, communicating by private email, and scheduling acute appointments online when needed.  Please return in 1 year for your yearly visit, or sooner if needed, with Lab testing done 3-5 days before

## 2014-10-07 ENCOUNTER — Other Ambulatory Visit: Payer: Self-pay | Admitting: Internal Medicine

## 2014-11-22 ENCOUNTER — Other Ambulatory Visit (INDEPENDENT_AMBULATORY_CARE_PROVIDER_SITE_OTHER): Payer: BC Managed Care – PPO

## 2014-11-22 DIAGNOSIS — Z0000 Encounter for general adult medical examination without abnormal findings: Secondary | ICD-10-CM

## 2014-11-22 LAB — BASIC METABOLIC PANEL
BUN: 17 mg/dL (ref 6–23)
CO2: 27 meq/L (ref 19–32)
CREATININE: 1 mg/dL (ref 0.4–1.5)
Calcium: 9.4 mg/dL (ref 8.4–10.5)
Chloride: 105 mEq/L (ref 96–112)
GFR: 84.82 mL/min (ref >60.00)
Glucose, Bld: 94 mg/dL (ref 70–99)
Potassium: 4.5 mEq/L (ref 3.5–5.1)
Sodium: 139 mEq/L (ref 135–145)

## 2014-11-22 LAB — CBC WITH DIFFERENTIAL/PLATELET
BASOS PCT: 0.6 % (ref 0.0–3.0)
Basophils Absolute: 0 10*3/uL (ref 0.0–0.1)
Eosinophils Absolute: 0.1 10*3/uL (ref 0.0–0.7)
Eosinophils Relative: 2.3 % (ref 0.0–5.0)
HCT: 43.2 % (ref 39.0–52.0)
Hemoglobin: 14.6 g/dL (ref 13.0–17.0)
Lymphocytes Relative: 27.3 % (ref 12.0–46.0)
Lymphs Abs: 1.7 10*3/uL (ref 0.7–4.0)
MCHC: 33.8 g/dL (ref 30.0–36.0)
MCV: 88.2 fl (ref 78.0–100.0)
Monocytes Absolute: 0.4 10*3/uL (ref 0.1–1.0)
Monocytes Relative: 7.1 % (ref 3.0–12.0)
NEUTROS PCT: 62.7 % (ref 43.0–77.0)
Neutro Abs: 3.9 10*3/uL (ref 1.4–7.7)
Platelets: 182 10*3/uL (ref 150.0–400.0)
RBC: 4.89 Mil/uL (ref 4.22–5.81)
RDW: 13.4 % (ref 11.5–15.5)
WBC: 6.2 10*3/uL (ref 4.0–10.5)

## 2014-11-22 LAB — LIPID PANEL
CHOLESTEROL: 165 mg/dL (ref 0–200)
HDL: 65.5 mg/dL (ref >39.00)
LDL CALC: 83 mg/dL (ref 0–99)
NonHDL: 99.5
Total CHOL/HDL Ratio: 3
Triglycerides: 83 mg/dL (ref 0.0–149.0)
VLDL: 16.6 mg/dL (ref 0.0–40.0)

## 2014-11-22 LAB — HEPATIC FUNCTION PANEL
ALBUMIN: 4.2 g/dL (ref 3.5–5.2)
ALT: 36 U/L (ref 0–53)
AST: 31 U/L (ref 0–37)
Alkaline Phosphatase: 52 U/L (ref 39–117)
BILIRUBIN TOTAL: 0.7 mg/dL (ref 0.2–1.2)
Bilirubin, Direct: 0.1 mg/dL (ref 0.0–0.3)
Total Protein: 6.8 g/dL (ref 6.0–8.3)

## 2014-11-22 LAB — URINALYSIS, ROUTINE W REFLEX MICROSCOPIC
Bilirubin Urine: NEGATIVE
Ketones, ur: NEGATIVE
Leukocytes, UA: NEGATIVE
Nitrite: NEGATIVE
RBC / HPF: NONE SEEN (ref 0–?)
Specific Gravity, Urine: 1.025 (ref 1.000–1.030)
Total Protein, Urine: NEGATIVE
URINE GLUCOSE: NEGATIVE
Urobilinogen, UA: 0.2 (ref 0.0–1.0)
WBC UA: NONE SEEN (ref 0–?)
pH: 5.5 (ref 5.0–8.0)

## 2014-11-22 LAB — PSA: PSA: 0.58 ng/mL (ref 0.10–4.00)

## 2014-11-22 LAB — TSH: TSH: 2 u[IU]/mL (ref 0.35–4.50)

## 2014-11-29 ENCOUNTER — Ambulatory Visit (INDEPENDENT_AMBULATORY_CARE_PROVIDER_SITE_OTHER): Payer: BLUE CROSS/BLUE SHIELD | Admitting: Internal Medicine

## 2014-11-29 ENCOUNTER — Encounter: Payer: Self-pay | Admitting: Internal Medicine

## 2014-11-29 VITALS — BP 132/84 | HR 81 | Temp 98.4°F | Ht 70.0 in | Wt 227.5 lb

## 2014-11-29 DIAGNOSIS — Z0000 Encounter for general adult medical examination without abnormal findings: Secondary | ICD-10-CM

## 2014-11-29 DIAGNOSIS — Z23 Encounter for immunization: Secondary | ICD-10-CM

## 2014-11-29 NOTE — Progress Notes (Signed)
Subjective:    Patient ID: Todd Barrett., Todd Barrett    DOB: 01/09/1951, [age] y.o.   MRN: [id number]  HPI  Here for wellness and f/u;  Overall doing ok;  Pt denies CP, worsening SOB, DOE, wheezing, orthopnea, PND, worsening LE edema, palpitations, dizziness or syncope.  Pt denies neurological change such as new headache, facial or extremity weakness.  Pt denies polydipsia, polyuria, or low sugar symptoms. Pt states overall good compliance with treatment and medications, good tolerability, and has been trying to follow lower cholesterol diet.  Pt denies worsening depressive symptoms, suicidal ideation or panic. No fever, night sweats, wt loss, loss of appetite, or other constitutional symptoms.  Pt states good ability with ADL's, has low fall risk, home safety reviewed and adequate, no other significant changes in hearing or vision, and only occasionally active with exercise.  No current complaints Past Medical History  Diagnosis Date  . ALLERGIC RHINITIS 01/08/2010  . ANEMIA-NOS 01/08/2010  . ANXIETY 01/08/2010  . CERVICAL RADICULOPATHY, RIGHT 01/08/2010  . CHEST PAIN 01/08/2010  . DIVERTICULOSIS, COLON 01/08/2010  . GOUT 01/08/2010  . HYPERLIPIDEMIA 01/08/2010  . 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 Lumbar disc disease 08/05/2011  . Shortness of breath     with exertion  . GERD (gastroesophageal reflux disease)     Zantac OTC  prn   Past Surgical History  Procedure Laterality Date  . Lumbar disc surgury  02/2002  . Lumbar disc surgury  june 2004  . Left knee arthroscopy  september 2005  . S/p left tkr  april 2006  . S/p left shoulder surgury  feb. 2009  . Clavicle facture  nov. 2009    100% displacement  . S/p ent surgury      x 3  . Hernia repair    . Colonoscopy    . Back surgery  03/2004, 02/2005  . Cervical spine surgery  05/2010  . Knee arthroscopy      total of 4  . Nasal reconstruction      x 3  . Anterior cervical decomp/discectomy fusion  07/23/2012    Procedure:  ANTERIOR CERVICAL DECOMPRESSION/DISCECTOMY FUSION 2 LEVELS;  Surgeon: Clydene FakeJames R Hirsch, MD;  Location: [facility];  Service: Neurosurgery;  Laterality: N/A;  Cervical three-four, four-five, redo Cervical six-seven anterior cervical decompression, discectomy fusion with allograft and plating    reports that he has never smoked. He has never used smokeless tobacco. He reports that he drinks about 7.2 oz of alcohol per week. He reports that he does not use illicit drugs. family history includes Lymphoma in his mother; Pancreatic cancer in his brother. There is no history of Colon cancer. Allergies  Allergen Reactions  . Dust Mite Extract Other (See Comments)    Nasal Congestion   Current Outpatient Prescriptions on File Prior to Visit  Medication Sig Dispense Refill  . atorvastatin (LIPITOR) 10 MG tablet TAKE 1 TABLET BY MOUTH EVERY DAY 90 tablet 3  . atorvastatin (LIPITOR) 10 MG tablet TAKE 1 TABLET BY MOUTH EVERY DAY 90 tablet 0  . celecoxib (CELEBREX) 200 MG capsule Take 1 capsule (200 mg total) by mouth 2 (two) times daily. 60 capsule 11  . fexofenadine (ALLEGRA) 180 MG tablet Take 180 mg by mouth daily.     . fluticasone (FLONASE) 50 MCG/ACT nasal spray Place 2 sprays into the nose daily as needed. For allergies.    . Multiple Vitamin (MULTIVITAMIN) capsule Take 1 capsule by mouth daily.      .Marland Kitchen  Omega-3 Fatty Acids (FISH OIL) 1000 MG CAPS Take by mouth 2 (two) times daily.      . potassium gluconate 595 MG TABS Take 595 mg by mouth 2 (two) times daily.      No current facility-administered medications on file prior to visit.   Review of Systems Constitutional: Negative for increased diaphoresis, other activity, appetite or other siginficant weight change  HENT: Negative for worsening hearing loss, ear pain, facial swelling, mouth sores and neck stiffness.   Eyes: Negative for other worsening pain, redness or visual disturbance.  Respiratory: Negative for shortness of breath and wheezing.     Cardiovascular: Negative for chest pain and palpitations.  Gastrointestinal: Negative for diarrhea, blood in stool, abdominal distention or other pain Genitourinary: Negative for hematuria, flank pain or change in urine volume.  Musculoskeletal: Negative for myalgias or other joint complaints.  Skin: Negative for color change and wound.  Neurological: Negative for syncope and numbness. other than noted Hematological: Negative for adenopathy. or other swelling Psychiatric/Behavioral: Negative for hallucinations, self-injury, decreased concentration or other worsening agitation.      Objective:   Physical Exam BP 132/84 mmHg  Pulse 81  Temp(Src) 98.4 F (36.9 C) (Oral)  Ht  (1.778 m)  Wt 227 lb 8 oz (103.193 kg)  BMI 32.64 kg/m2  SpO2 97% VS noted,  Constitutional: Pt is oriented to person, place, and time. Appears well-developed and well-nourished.  Head: Normocephalic and atraumatic.  Right Ear: External ear normal.  Left Ear: External ear normal.  Nose: Nose normal.  Mouth/Throat: Oropharynx is clear and moist.  Eyes: Conjunctivae and EOM are normal. Pupils are equal, round, and reactive to light.  Neck: Normal range of motion. Neck supple. No JVD present. No tracheal deviation present.  Cardiovascular: Normal rate, regular rhythm, normal heart sounds and intact distal pulses.   Pulmonary/Chest: Effort normal and breath sounds without rales or wheezing  Abdominal: Soft. Bowel sounds are normal. NT. No HSM  Musculoskeletal: Normal range of motion. Exhibits no edema.  Lymphadenopathy:  Has no cervical adenopathy.  Neurological: Pt is alert and oriented to person, place, and time. Pt has normal reflexes. No cranial nerve deficit. Motor grossly intact Skin: Skin is warm and dry. No rash noted.  Psychiatric:  Has normal mood and affect. Behavior is normal.      Assessment & Plan:

## 2014-11-29 NOTE — Patient Instructions (Signed)
You had the flu shot today  Please continue all other medications as before, and refills have been done if requested.  Please have the pharmacy call with any other refills you may need.  Please continue your efforts at being more active, low cholesterol diet, and weight control.  You are otherwise up to date with prevention measures today.  Please keep your appointments with your specialists as you may have planned  Please return in 1 year for your yearly visit, or sooner if needed 

## 2014-11-29 NOTE — Assessment & Plan Note (Signed)

## 2014-11-29 NOTE — Progress Notes (Signed)
Pre visit review using our clinic review tool, if applicable. No additional management support is needed unless otherwise documented below in the visit note. 

## 2015-01-04 ENCOUNTER — Other Ambulatory Visit: Payer: Self-pay | Admitting: Internal Medicine

## 2015-11-15 ENCOUNTER — Telehealth: Payer: Self-pay | Admitting: Internal Medicine

## 2015-11-15 DIAGNOSIS — Z0000 Encounter for general adult medical examination without abnormal findings: Secondary | ICD-10-CM

## 2015-11-15 NOTE — Telephone Encounter (Signed)
Labs ordered, pt advised.

## 2015-11-15 NOTE — Telephone Encounter (Signed)
Pt has a physical scheduled for January 6th and he is requesting to do labs ahead of time. Please advise

## 2015-11-24 ENCOUNTER — Other Ambulatory Visit (INDEPENDENT_AMBULATORY_CARE_PROVIDER_SITE_OTHER): Payer: BLUE CROSS/BLUE SHIELD

## 2015-11-24 DIAGNOSIS — Z0000 Encounter for general adult medical examination without abnormal findings: Secondary | ICD-10-CM | POA: Diagnosis not present

## 2015-11-24 LAB — BASIC METABOLIC PANEL
BUN: 21 mg/dL (ref 6–23)
CHLORIDE: 105 meq/L (ref 96–112)
CO2: 27 meq/L (ref 19–32)
CREATININE: 1.01 mg/dL (ref 0.40–1.50)
Calcium: 9.6 mg/dL (ref 8.4–10.5)
GFR: 78.78 mL/min (ref >60.00)
Glucose, Bld: 91 mg/dL (ref 70–99)
Potassium: 4.7 mEq/L (ref 3.5–5.1)
Sodium: 139 mEq/L (ref 135–145)

## 2015-11-24 LAB — LIPID PANEL
CHOL/HDL RATIO: 2
Cholesterol: 165 mg/dL (ref 0–200)
HDL: 77.9 mg/dL (ref >39.00)
LDL CALC: 79 mg/dL (ref 0–99)
NonHDL: 87.34
TRIGLYCERIDES: 43 mg/dL (ref 0.0–149.0)
VLDL: 8.6 mg/dL (ref 0.0–40.0)

## 2015-11-24 LAB — HEPATIC FUNCTION PANEL
ALK PHOS: 54 U/L (ref 39–117)
ALT: 43 U/L (ref 0–53)
AST: 42 U/L — ABNORMAL HIGH (ref 0–37)
Albumin: 4.4 g/dL (ref 3.5–5.2)
BILIRUBIN DIRECT: 0.2 mg/dL (ref 0.0–0.3)
Total Bilirubin: 0.8 mg/dL (ref 0.2–1.2)
Total Protein: 6.6 g/dL (ref 6.0–8.3)

## 2015-11-24 LAB — CBC WITH DIFFERENTIAL/PLATELET
BASOS ABS: 0 10*3/uL (ref 0.0–0.1)
Basophils Relative: 0.4 % (ref 0.0–3.0)
EOS ABS: 0.1 10*3/uL (ref 0.0–0.7)
Eosinophils Relative: 2.1 % (ref 0.0–5.0)
HCT: 43.3 % (ref 39.0–52.0)
Hemoglobin: 14.6 g/dL (ref 13.0–17.0)
LYMPHS ABS: 1.6 10*3/uL (ref 0.7–4.0)
Lymphocytes Relative: 26.1 % (ref 12.0–46.0)
MCHC: 33.9 g/dL (ref 30.0–36.0)
MCV: 88.9 fl (ref 78.0–100.0)
Monocytes Absolute: 0.5 10*3/uL (ref 0.1–1.0)
Monocytes Relative: 7.8 % (ref 3.0–12.0)
NEUTROS ABS: 3.9 10*3/uL (ref 1.4–7.7)
NEUTROS PCT: 63.6 % (ref 43.0–77.0)
PLATELETS: 171 10*3/uL (ref 150.0–400.0)
RBC: 4.87 Mil/uL (ref 4.22–5.81)
RDW: 13.1 % (ref 11.5–15.5)
WBC: 6.2 10*3/uL (ref 4.0–10.5)

## 2015-11-24 LAB — URINALYSIS, ROUTINE W REFLEX MICROSCOPIC
Bilirubin Urine: NEGATIVE
KETONES UR: NEGATIVE
Leukocytes, UA: NEGATIVE
Nitrite: NEGATIVE
PH: 5.5 (ref 5.0–8.0)
SPECIFIC GRAVITY, URINE: 1.02 (ref 1.000–1.030)
Total Protein, Urine: NEGATIVE
Urine Glucose: NEGATIVE
Urobilinogen, UA: 0.2 (ref 0.0–1.0)
WBC UA: NONE SEEN (ref 0–?)

## 2015-11-24 LAB — PSA: PSA: 0.59 ng/mL (ref 0.10–4.00)

## 2015-11-24 LAB — TSH: TSH: 1.92 u[IU]/mL (ref 0.35–4.50)

## 2015-12-01 ENCOUNTER — Ambulatory Visit (INDEPENDENT_AMBULATORY_CARE_PROVIDER_SITE_OTHER): Payer: Medicare Other | Admitting: Internal Medicine

## 2015-12-01 ENCOUNTER — Encounter: Payer: Self-pay | Admitting: Internal Medicine

## 2015-12-01 VITALS — BP 120/82 | HR 64 | Temp 97.9°F | Ht 70.0 in | Wt 218.0 lb

## 2015-12-01 DIAGNOSIS — D649 Anemia, unspecified: Secondary | ICD-10-CM | POA: Diagnosis not present

## 2015-12-01 DIAGNOSIS — F411 Generalized anxiety disorder: Secondary | ICD-10-CM

## 2015-12-01 DIAGNOSIS — Z23 Encounter for immunization: Secondary | ICD-10-CM | POA: Diagnosis not present

## 2015-12-01 DIAGNOSIS — E785 Hyperlipidemia, unspecified: Secondary | ICD-10-CM

## 2015-12-01 DIAGNOSIS — Z1159 Encounter for screening for other viral diseases: Secondary | ICD-10-CM

## 2015-12-01 DIAGNOSIS — J309 Allergic rhinitis, unspecified: Secondary | ICD-10-CM | POA: Diagnosis not present

## 2015-12-01 MED ORDER — ATORVASTATIN CALCIUM 10 MG PO TABS: 10.0000 mg | ORAL_TABLET | Freq: Every day | ORAL | Status: DC

## 2015-12-01 NOTE — Assessment & Plan Note (Signed)
stable overall by history and exam, recent data reviewed with pt, and pt to continue medical treatment as before,  to f/u any worsening symptoms or concerns Lab Results  Component Value Date   WBC 6.2 11/24/2015   HGB 14.6 11/24/2015   HCT 43.3 11/24/2015   PLT 171.0 11/24/2015   GLUCOSE 91 11/24/2015   CHOL 165 11/24/2015   TRIG 43.0 11/24/2015   HDL 77.90 11/24/2015   LDLDIRECT 142.0 09/29/2012   LDLCALC 79 11/24/2015   ALT 43 11/24/2015   AST 42* 11/24/2015   NA 139 11/24/2015   K 4.7 11/24/2015   CL 105 11/24/2015   CREATININE 1.01 11/24/2015   BUN 21 11/24/2015   CO2 27 11/24/2015   TSH 1.92 11/24/2015   PSA 0.59 11/24/2015   INR 1.03 07/23/2012

## 2015-12-01 NOTE — Progress Notes (Signed)
Pre visit review using our clinic review tool, if applicable. No additional management support is needed unless otherwise documented below in the visit note. 

## 2015-12-01 NOTE — Patient Instructions (Addendum)
You had the flu shot today  Please make a Nurse Visit appt for 2 weeks to get the Prevnar pneumonia shot  Please continue all other medications as before, and refills have been done if requested.  Please have the pharmacy call with any other refills you may need.  Please continue your efforts at being more active, low cholesterol diet, and weight control.  You are otherwise up to date with prevention measures today.  Please keep your appointments with your specialists as you may have planned  Please go to the LAB in the Basement (turn left off the elevator) for the tests to be done in 2 weeks  You will be contacted by phone if any changes need to be made immediately.  Otherwise, you will receive a letter about your results with an explanation, but please check with MyChart first.  Please remember to sign up for MyChart if you have not done so, as this will be important to you in the future with finding out test results, communicating by private email, and scheduling acute appointments online when needed.  Please return in 1 year for your yearly visit, or sooner if needed

## 2015-12-01 NOTE — Progress Notes (Signed)
Subjective:    Patient ID: Todd Pace., male    DOB: 12-Aug-1951, 65 y.o.   MRN: [id number]  HPI  Here for yearly f/u;  Overall doing ok;  Pt denies Chest pain, worsening SOB, DOE, wheezing, orthopnea, PND, worsening LE edema, palpitations, dizziness or syncope.  Pt denies neurological change such as new headache, facial or extremity weakness.  Pt denies polydipsia, polyuria, or low sugar symptoms. Pt states overall good compliance with treatment and medications, good tolerability, and has been trying to follow appropriate diet.  Pt denies worsening depressive symptoms, suicidal ideation or panic. No fever, night sweats, wt loss, loss of appetite, or other constitutional symptoms.  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  No current complaints. No knee pain but has limited ROM.  No overt bleeding recent.  No recent gout flares.  Denies worsening depressive symptoms, suicidal ideation, or panic.  .Does have several wks ongoing nasal allergy symptoms with clearish congestion, itch and sneezing, without fever, pain, ST, cough, swelling or wheezing.  Past Medical History  Diagnosis Date  . ALLERGIC RHINITIS 01/08/2010  . ANEMIA-NOS 01/08/2010  . ANXIETY 01/08/2010  . CERVICAL RADICULOPATHY, RIGHT 01/08/2010  . CHEST PAIN 01/08/2010  . DIVERTICULOSIS, COLON 01/08/2010  . GOUT 01/08/2010  . HYPERLIPIDEMIA 01/08/2010  . 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 Lumbar disc disease 08/05/2011  . Shortness of breath     with exertion  . GERD (gastroesophageal reflux disease)     Zantac OTC  prn   Past Surgical History  Procedure Laterality Date  . Lumbar disc surgury  02/2002  . Lumbar disc surgury  june 2004  . Left knee arthroscopy  september 2005  . S/p left tkr  april 2006  . S/p left shoulder surgury  feb.  2009  . Clavicle facture  nov. 2009    100% displacement  . S/p ent surgury      x 3  . Hernia repair    . Colonoscopy    . Back surgery  03/2004, 02/2005  . Cervical spine surgery  05/2010  . Knee arthroscopy      total of 4  . Nasal reconstruction      x 3  . Anterior cervical decomp/discectomy fusion  07/23/2012    Procedure: ANTERIOR CERVICAL DECOMPRESSION/DISCECTOMY FUSION 2 LEVELS;  Surgeon: Clydene Fake, MD;  Location: [facility];  Service: Neurosurgery;  Laterality: N/A;  Cervical three-four, four-five, redo Cervical six-seven anterior cervical decompression, discectomy fusion with allograft and plating    reports that he has never smoked. He has never used smokeless tobacco. He reports that he drinks about 7.2 oz of alcohol per week. He reports that he does not use illicit drugs. family history includes Lymphoma in his mother; Pancreatic cancer in his brother. There is no history of Colon cancer. Allergies  Allergen Reactions  . Dust Mite Extract Other (See Comments)    Nasal Congestion   Current Outpatient Prescriptions on File Prior to Visit  Medication Sig Dispense Refill  . fexofenadine (ALLEGRA) 180 MG tablet Take 180 mg by mouth daily.     . fluticasone (FLONASE) 50 MCG/ACT nasal spray Place 2 sprays into the nose daily as needed. For allergies.    . Multiple Vitamin (MULTIVITAMIN) capsule Take 1 capsule by mouth daily.      Marland Kitchen  Omega-3 Fatty Acids (FISH OIL) 1000 MG CAPS Take by mouth 2 (two) times daily.      . potassium gluconate 595 MG TABS Take 595 mg by mouth 2 (two) times daily.     . celecoxib (CELEBREX) 200 MG capsule Take 1 capsule (200 mg total) by mouth 2 (two) times daily. (Patient not taking: Reported on 12/01/2015) 60 capsule 11   No current facility-administered medications on file prior to visit.    Review of Systems .Constitutional: Negative for increased diaphoresis, other activity, appetite or siginficant weight change other than noted HENT:  Negative for worsening hearing loss, ear pain, facial swelling, mouth sores and neck stiffness.   Eyes: Negative for other worsening pain, redness or visual disturbance.  Respiratory: Negative for shortness of breath and wheezing  Cardiovascular: Negative for chest pain and palpitations.  Gastrointestinal: Negative for diarrhea, blood in stool, abdominal distention or other pain Genitourinary: Negative for hematuria, flank pain or change in urine volume.  Musculoskeletal: Negative for myalgias or other joint complaints.  Skin: Negative for color change and wound or drainage.  Neurological: Negative for syncope and numbness. other than noted Hematological: Negative for adenopathy. or other swelling Psychiatric/Behavioral: Negative for hallucinations, SI, self-injury, decreased concentration or other worsening agitation.      Objective:   Physical Exam BP 120/82 mmHg  Pulse 64  Temp(Src) 97.9 F (36.6 C) (Oral)  Ht 5\' 10"  (1.778 m)  Wt 218 lb (98.884 kg)  BMI 31.28 kg/m2  SpO2 97% VS noted, not ill apearing Constitutional: Pt is oriented to person, place, and time. Appears well-developed and well-nourished, in no significant distress Head: Normocephalic and atraumatic.  Right Ear: External ear normal.  Left Ear: External ear normal.  Nose: Nose normal. Bilat tm's with mild erythema.  Max sinus areas non tender.  Pharynx with mild erythema, no exudate Mouth/Throat: Oropharynx is clear and moist.  Eyes: Conjunctivae and EOM are normal. Pupils are equal, round, and reactive to light.  Neck: Normal range of motion. Neck supple. No JVD present. No tracheal deviation present or significant neck LA or mass Cardiovascular: Normal rate, regular rhythm, normal heart sounds and intact distal pulses.   Pulmonary/Chest: Effort normal and breath sounds without rales or wheezing  Abdominal: Soft. Bowel sounds are normal. NT. No HSM  Musculoskeletal: Normal range of motion. Exhibits no edema.    Lymphadenopathy:  Has no cervical adenopathy.  Neurological: Pt is alert and oriented to person, place, and time. Pt has normal reflexes. No cranial nerve deficit. Motor grossly intact Left knee nontender but with reduced ROM to 90 degrees Skin: Skin is warm and dry. No rash noted.  Psychiatric:  Has normal mood and affect. Behavior is normal.     Assessment & Plan:

## 2015-12-01 NOTE — Assessment & Plan Note (Signed)
Ok for [employer] flonase/zyrtec asd,  to f/u any worsening symptoms or concerns

## 2015-12-01 NOTE — Assessment & Plan Note (Addendum)
stable overall by history and exam, recent data reviewed with pt, and pt to continue medical treatment as before,  to f/u any worsening symptoms or concerns ; ECG reviewed as per emr Lab Results  Component Value Date   LDLCALC 79 11/24/2015

## 2015-12-01 NOTE — Assessment & Plan Note (Signed)
Resolved, stable,  to f/u any worsening symptoms or concerns Lab Results  Component Value Date   WBC 6.2 11/24/2015   HGB 14.6 11/24/2015   HCT 43.3 11/24/2015   MCV 88.9 11/24/2015   PLT 171.0 11/24/2015

## 2015-12-15 ENCOUNTER — Ambulatory Visit (INDEPENDENT_AMBULATORY_CARE_PROVIDER_SITE_OTHER): Payer: Medicare Other

## 2015-12-15 ENCOUNTER — Other Ambulatory Visit: Payer: Medicare Other

## 2015-12-15 DIAGNOSIS — Z1159 Encounter for screening for other viral diseases: Secondary | ICD-10-CM | POA: Diagnosis not present

## 2015-12-15 DIAGNOSIS — Z23 Encounter for immunization: Secondary | ICD-10-CM | POA: Diagnosis not present

## 2015-12-16 LAB — HEPATITIS C ANTIBODY: HCV AB: NEGATIVE

## 2016-01-08 ENCOUNTER — Other Ambulatory Visit: Payer: Self-pay | Admitting: Internal Medicine

## 2016-11-27 ENCOUNTER — Other Ambulatory Visit (INDEPENDENT_AMBULATORY_CARE_PROVIDER_SITE_OTHER): Payer: Medicare Other

## 2016-11-27 ENCOUNTER — Telehealth: Payer: Self-pay

## 2016-11-27 DIAGNOSIS — Z125 Encounter for screening for malignant neoplasm of prostate: Secondary | ICD-10-CM

## 2016-11-27 DIAGNOSIS — E108 Type 1 diabetes mellitus with unspecified complications: Secondary | ICD-10-CM | POA: Diagnosis not present

## 2016-11-27 DIAGNOSIS — Z0000 Encounter for general adult medical examination without abnormal findings: Secondary | ICD-10-CM

## 2016-11-27 DIAGNOSIS — E785 Hyperlipidemia, unspecified: Secondary | ICD-10-CM

## 2016-11-27 LAB — CBC
HEMATOCRIT: 42 % (ref 39.0–52.0)
HEMOGLOBIN: 14.7 g/dL (ref 13.0–17.0)
MCHC: 35 g/dL (ref 30.0–36.0)
MCV: 88.1 fl (ref 78.0–100.0)
PLATELETS: 160 10*3/uL (ref 150.0–400.0)
RBC: 4.77 Mil/uL (ref 4.22–5.81)
RDW: 12.8 % (ref 11.5–15.5)
WBC: 5 10*3/uL (ref 4.0–10.5)

## 2016-11-27 LAB — LIPID PANEL
CHOLESTEROL: 132 mg/dL (ref 0–200)
HDL: 52.2 mg/dL (ref >39.00)
LDL Cholesterol: 65 mg/dL (ref 0–99)
NonHDL: 79.38
TRIGLYCERIDES: 71 mg/dL (ref 0.0–149.0)
Total CHOL/HDL Ratio: 3
VLDL: 14.2 mg/dL (ref 0.0–40.0)

## 2016-11-27 LAB — BASIC METABOLIC PANEL
BUN: 22 mg/dL (ref 6–23)
CHLORIDE: 107 meq/L (ref 96–112)
CO2: 25 mEq/L (ref 19–32)
CREATININE: 1.01 mg/dL (ref 0.40–1.50)
Calcium: 9.2 mg/dL (ref 8.4–10.5)
GFR: 78.54 mL/min (ref >60.00)
Glucose, Bld: 155 mg/dL — ABNORMAL HIGH (ref 70–99)
Potassium: 3.8 mEq/L (ref 3.5–5.1)
Sodium: 140 mEq/L (ref 135–145)

## 2016-11-27 LAB — HEMOGLOBIN A1C: Hgb A1c MFr Bld: 5.5 % (ref 4.6–6.5)

## 2016-11-27 LAB — TSH: TSH: 2.48 u[IU]/mL (ref 0.35–4.50)

## 2016-11-27 LAB — MICROALBUMIN / CREATININE URINE RATIO
Creatinine,U: 359.9 mg/dL
MICROALB UR: 1 mg/dL (ref 0.0–1.9)
Microalb Creat Ratio: 0.3 mg/g (ref 0.0–30.0)

## 2016-11-27 NOTE — Telephone Encounter (Signed)
Lab orders placed.  

## 2016-11-29 ENCOUNTER — Encounter: Payer: Medicare Other | Admitting: Internal Medicine

## 2016-12-04 ENCOUNTER — Encounter: Payer: Self-pay | Admitting: Internal Medicine

## 2016-12-04 ENCOUNTER — Ambulatory Visit (INDEPENDENT_AMBULATORY_CARE_PROVIDER_SITE_OTHER): Payer: Medicare Other | Admitting: Internal Medicine

## 2016-12-04 VITALS — BP 130/70 | HR 60 | Temp 97.9°F | Resp 20 | Wt 233.0 lb

## 2016-12-04 DIAGNOSIS — R739 Hyperglycemia, unspecified: Secondary | ICD-10-CM

## 2016-12-04 DIAGNOSIS — Z23 Encounter for immunization: Secondary | ICD-10-CM

## 2016-12-04 DIAGNOSIS — D649 Anemia, unspecified: Secondary | ICD-10-CM | POA: Diagnosis not present

## 2016-12-04 DIAGNOSIS — E785 Hyperlipidemia, unspecified: Secondary | ICD-10-CM | POA: Diagnosis not present

## 2016-12-04 DIAGNOSIS — Z0000 Encounter for general adult medical examination without abnormal findings: Secondary | ICD-10-CM

## 2016-12-04 DIAGNOSIS — Z86010 Personal history of colonic polyps: Secondary | ICD-10-CM | POA: Diagnosis not present

## 2016-12-04 DIAGNOSIS — J309 Allergic rhinitis, unspecified: Secondary | ICD-10-CM

## 2016-12-04 NOTE — Addendum Note (Signed)
Addended by: Etheleen MayhewOX, HEATHER C on: 12/04/2016 10:44 AM   Modules accepted: Orders

## 2016-12-04 NOTE — Patient Instructions (Addendum)
You had the Pneumovax pneumonia shot today, You had the flu shot today  Please continue all other medications as before, and refills have been done if requested.  Please have the pharmacy call with any other refills you may need.  Please continue your efforts at being more active, low cholesterol diet, and weight control.  You are otherwise up to date with prevention measures today.  Please keep your appointments with your specialists as you may have planned  You will be contacted regarding the referral for: colonoscopy, and eye doctor

## 2016-12-04 NOTE — Assessment & Plan Note (Signed)
stable overall by history and exam, recent data reviewed with pt, and pt to continue medical treatment as before,  to f/u any worsening symptoms or concerns Lab Results  Component Value Date   LDLCALC 65 11/27/2016   Cont diet, lipitor

## 2016-12-04 NOTE — Progress Notes (Signed)
Pre visit review using our clinic review tool, if applicable. No additional management support is needed unless otherwise documented below in the visit note. 

## 2016-12-04 NOTE — Assessment & Plan Note (Signed)
stable overall by history and exam, and pt to continue medical treatment as before,  to f/u any worsening symptoms or concerns 

## 2016-12-04 NOTE — Progress Notes (Signed)
Subjective:    Patient ID: Todd Barrett., male    DOB: 07-Jan-1951, 66 y.o.   MRN: [id number]  HPI Here for yearly f/u;  Overall doing ok;  Pt denies Chest pain, worsening SOB, DOE, wheezing, orthopnea, PND, worsening LE edema, palpitations, dizziness or syncope.  Pt denies neurological change such as new headache, facial or extremity weakness.  Pt denies polydipsia, polyuria, or low sugar symptoms. Pt states overall good compliance with treatment and medications, good tolerability, and has been trying to follow appropriate diet.  Pt denies worsening depressive symptoms, suicidal ideation or panic. No fever, night sweats, wt loss, loss of appetite, or other constitutional symptoms.  Pt states good ability with ADL's, has low fall risk, home safety reviewed and adequate, no other significant changes in hearing or vision, and only occasionally active with exercise. Due for pneumovax, colonospcy and f/u with optho.  BP Readings from Last 3 Encounters:  12/04/16 130/70  12/01/15 120/82  11/29/14 132/84   Wt Readings from Last 3 Encounters:  12/04/16 233 lb (105.7 kg)  12/01/15 218 lb (98.9 kg)  11/29/14 227 lb 8 oz (103.2 kg)   Past Medical History:  Diagnosis Date  . ALLERGIC RHINITIS 01/08/2010  . ANEMIA-NOS 01/08/2010  . ANXIETY 01/08/2010  . CERVICAL RADICULOPATHY, RIGHT 01/08/2010  . CHEST PAIN 01/08/2010  . DIVERTICULOSIS, COLON 01/08/2010  . GERD (gastroesophageal reflux disease)    Zantac OTC  prn  . GOUT 01/08/2010  . HYPERLIPIDEMIA 01/08/2010  . Lumbar disc disease 08/05/2011  . 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 Shortness of breath    with exertion   Past Surgical History:  Procedure Laterality Date  . ANTERIOR CERVICAL DECOMP/DISCECTOMY FUSION  07/23/2012   Procedure: ANTERIOR CERVICAL DECOMPRESSION/DISCECTOMY FUSION 2 LEVELS;  Surgeon: Clydene Fake, MD;  Location: [facility];  Service: Neurosurgery;  Laterality: N/A;  Cervical three-four, four-five, redo  Cervical six-seven anterior cervical decompression, discectomy fusion with allograft and plating  . BACK SURGERY  03/2004, 02/2005  . CERVICAL SPINE SURGERY  05/2010  . clavicle facture  nov. 2009   100% displacement  . COLONOSCOPY    . HERNIA REPAIR    . KNEE ARTHROSCOPY     total of 4  . left knee arthroscopy  september 2005  . lumbar disc surgury  02/2002  . lumbar disc surgury  june 2004  . NASAL RECONSTRUCTION     x 3  . s/p ENT surgury     x 3  . s/p left shoulder surgury  feb. 2009  . s/p left TKR  april 2006    reports that he has never smoked. He has never used smokeless tobacco. He reports that he drinks about 7.2 oz of alcohol per week . He reports that he does not use drugs. family history includes Lymphoma in his mother; Pancreatic cancer in his brother. Allergies  Allergen Reactions  . Dust Mite Extract Other (See Comments)    Nasal Congestion   Current Outpatient Prescriptions on File Prior to Visit  Medication Sig Dispense Refill  . atorvastatin (LIPITOR) 10 MG tablet Take 1 tablet (10 mg total) by mouth daily. 90 tablet 3  . fluticasone (FLONASE) 50 MCG/ACT nasal spray Place 2 sprays into the nose daily as needed. For allergies.    . celecoxib (CELEBREX) 200 MG capsule Take 1 capsule (200 mg total) by mouth 2 (two) times daily. (Patient not taking: Reported on 12/04/2016) 60 capsule 11  . fexofenadine (ALLEGRA) 180 MG tablet  Take 180 mg by mouth daily.     . Multiple Vitamin (MULTIVITAMIN) capsule Take 1 capsule by mouth daily.      . Omega-3 Fatty Acids (FISH OIL) 1000 MG CAPS Take by mouth 2 (two) times daily.      . potassium gluconate 595 MG TABS Take 595 mg by mouth 2 (two) times daily.      No current facility-administered medications on file prior to visit.    Review of Systems Constitutional: Negative for increased diaphoresis, or other activity, appetite or siginficant weight change other than noted HENT: Negative for worsening hearing loss, ear pain,  facial swelling, mouth sores and neck stiffness.   Eyes: Negative for other worsening pain, redness or visual disturbance.  Respiratory: Negative for choking or stridor Cardiovascular: Negative for other chest pain and palpitations.  Gastrointestinal: Negative for worsening diarrhea, blood in stool, or abdominal distention Genitourinary: Negative for hematuria, flank pain or change in urine volume.  Musculoskeletal: Negative for myalgias or other joint complaints.  Skin: Negative for other color change and wound or drainage.  Neurological: Negative for syncope and numbness. other than noted Hematological: Negative for adenopathy. or other swelling Psychiatric/Behavioral: Negative for hallucinations, SI, self-injury, decreased concentration or other worsening agitation.  ALl other system neg per pt    Objective:   Physical Exam BP 130/70   Pulse 60   Temp 97.9 F (36.6 C) (Oral)   Resp 20   Wt 233 lb (105.7 kg)   SpO2 94%   BMI 33.43 kg/m  VS noted,  Constitutional: Pt is oriented to person, place, and time. Appears well-developed and well-nourished, in no significant distress Head: Normocephalic and atraumatic  Eyes: Conjunctivae and EOM are normal. Pupils are equal, round, and reactive to light Right Ear: External ear normal.  Left Ear: External ear normal Nose: Nose normal.  Mouth/Throat: Oropharynx is clear and moist  Neck: Normal range of motion. Neck supple. No JVD present. No tracheal deviation present or significant neck LA or mass Cardiovascular: Normal rate, regular rhythm, normal heart sounds and intact distal pulses.   Pulmonary/Chest: Effort normal and breath sounds without rales or wheezing  Abdominal: Soft. Bowel sounds are normal. NT. No HSM  Musculoskeletal: Normal range of motion. Exhibits no edema Lymphadenopathy: Has no cervical adenopathy.  Neurological: Pt is alert and oriented to person, place, and time. Pt has normal reflexes. No cranial nerve deficit.  Motor grossly intact Skin: Skin is warm and dry. No rash noted or new ulcers Psychiatric:  Has normal mood and affect. Behavior is normal.  No other new exam findings    Assessment & Plan:

## 2016-12-04 NOTE — Assessment & Plan Note (Signed)
No overt bleeding Lab Results  Component Value Date   WBC 5.0 11/27/2016   HGB 14.7 11/27/2016   HCT 42.0 11/27/2016   MCV 88.1 11/27/2016   PLT 160.0 11/27/2016   stable overall by history and exam, recent data reviewed with pt, and pt to continue medical treatment as before,  to f/u any worsening symptoms or concerns

## 2017-01-02 ENCOUNTER — Other Ambulatory Visit: Payer: Self-pay | Admitting: Internal Medicine

## 2017-01-02 ENCOUNTER — Encounter: Payer: Self-pay | Admitting: Internal Medicine

## 2017-01-31 DIAGNOSIS — H2513 Age-related nuclear cataract, bilateral: Secondary | ICD-10-CM | POA: Diagnosis not present

## 2017-02-11 ENCOUNTER — Ambulatory Visit (AMBULATORY_SURGERY_CENTER): Payer: Self-pay

## 2017-02-11 VITALS — Ht 70.0 in | Wt 235.0 lb

## 2017-02-11 DIAGNOSIS — Z86010 Personal history of colonic polyps: Secondary | ICD-10-CM

## 2017-02-11 MED ORDER — NA SULFATE-K SULFATE-MG SULF 17.5-3.13-1.6 GM/177ML PO SOLN: ORAL | 0 refills | Status: DC

## 2017-02-11 NOTE — Progress Notes (Signed)
Per pt, no allergies to soy or egg products.Pt not taking any weight loss meds or using  O2 at home. 

## 2017-03-04 ENCOUNTER — Ambulatory Visit (AMBULATORY_SURGERY_CENTER): Payer: Medicare Other | Admitting: Internal Medicine

## 2017-03-04 ENCOUNTER — Encounter: Payer: Self-pay | Admitting: Internal Medicine

## 2017-03-04 VITALS — BP 113/71 | HR 47 | Temp 98.6°F | Resp 16 | Ht 70.0 in | Wt 235.0 lb

## 2017-03-04 DIAGNOSIS — Z86010 Personal history of colonic polyps: Secondary | ICD-10-CM

## 2017-03-04 MED ORDER — SODIUM CHLORIDE 0.9 % IV SOLN: 500.0000 mL | INTRAVENOUS | Status: DC

## 2017-03-04 NOTE — Op Note (Signed)
[facility] Patient Name: Todd Barrett Procedure Date: 03/04/2017 8:19 AM MRN: [id number] Endoscopist: Wilhemina Bonito. Marina Goodell , MD Age: 66 Referring MD:  Date of Birth: 06/12/1951 Gender: Male Account #: [id number] Procedure:                Colonoscopy Indications:              High risk colon cancer surveillance: Personal                            history of non-advanced adenoma. Previous                            examinations 2004, 2008, and 2013 Medicines:                Monitored Anesthesia Care Procedure:                Pre-Anesthesia Assessment:                           - Prior to the procedure, a History and Physical                            was performed, and patient medications and                            allergies were reviewed. The patient's tolerance of                            previous anesthesia was also reviewed. The risks                            and benefits of the procedure and the sedation                            options and risks were discussed with the patient.                            All questions were answered, and informed consent                            was obtained. Prior Anticoagulants: The patient has                            taken no previous anticoagulant or antiplatelet                            agents. ASA Grade Assessment: II - A patient with                            mild systemic disease. After reviewing the risks                            and benefits, the patient was deemed in  satisfactory condition to undergo the procedure.                           After obtaining informed consent, the colonoscope                            was passed under direct vision. Throughout the                            procedure, the patient's blood pressure, pulse, and                            oxygen saturations were monitored continuously. The                            Colonoscope was introduced through the  anus and                            advanced to the the cecum, identified by                            appendiceal orifice and ileocecal valve. The                            ileocecal valve, appendiceal orifice, and rectum                            were photographed. The quality of the bowel                            preparation was good. The colonoscopy was performed                            without difficulty. The patient tolerated the                            procedure well. The bowel preparation used was                            SUPREP. Scope In: 8:39:46 AM Scope Out: 8:57:18 AM Scope Withdrawal Time: 0 hours 13 minutes 27 seconds  Total Procedure Duration: 0 hours 17 minutes 32 seconds  Findings:                 Multiple diverticula were found in the left colon                            and right colon.                           Internal hemorrhoids were found during retroflexion.                           The exam was otherwise without abnormality on  direct and retroflexion views. Complications:            No immediate complications. Estimated blood loss:                            None. Estimated Blood Loss:     Estimated blood loss: none. Impression:               - Diverticulosis in the left colon and in the right                            colon.                           - Internal hemorrhoids.                           - The examination was otherwise normal on direct                            and retroflexion views.                           - No specimens collected. Recommendation:           - Repeat colonoscopy in 10 years for surveillance.                           - Patient has a contact number available for                            emergencies. The signs and symptoms of potential                            delayed complications were discussed with the                            patient. Return to normal activities tomorrow.                             Written discharge instructions were provided to the                            patient.                           - Resume previous diet.                           - Continue present medications. Wilhemina Bonito. Marina Goodell, MD 03/04/2017 9:15:54 AM This report has been signed electronically.

## 2017-03-04 NOTE — Progress Notes (Signed)
Spontaneous respirations throughout. VSS. Resting comfortably. To PACU on room air. Report to  Sara RN. 

## 2017-03-04 NOTE — Progress Notes (Signed)
Pt's states no medical or surgical changes since previsit or office visit. 

## 2017-03-04 NOTE — Patient Instructions (Signed)
YOU HAD AN ENDOSCOPIC PROCEDURE TODAY AT THE [facility]:   Refer to the procedure report that was given to you for any specific questions about what was found during the examination.  If the procedure report does not answer your questions, please call your gastroenterologist to clarify.  If you requested that your care partner not be given the details of your procedure findings, then the procedure report has been included in a sealed envelope for you to review at your convenience later.  YOU SHOULD EXPECT: Some feelings of bloating in the abdomen. Passage of more gas than usual.  Walking can help get rid of the air that was put into your GI tract during the procedure and reduce the bloating. If you had a lower endoscopy (such as a colonoscopy or flexible sigmoidoscopy) you may notice spotting of blood in your stool or on the toilet paper. If you underwent a bowel prep for your procedure, you may not have a normal bowel movement for a few days.  Please Note:  You might notice some irritation and congestion in your nose or some drainage.  This is from the oxygen used during your procedure.  There is no need for concern and it should clear up in a day or so.  SYMPTOMS TO REPORT IMMEDIATELY:   Following lower endoscopy (colonoscopy or flexible sigmoidoscopy):  Excessive amounts of blood in the stool  Significant tenderness or worsening of abdominal pains  Swelling of the abdomen that is new, acute  Fever of 100F or higher   For urgent or emergent issues, a gastroenterologist can be reached at any hour by calling [phone].   DIET:  We do recommend a small meal at first, but then you may proceed to your regular diet.  Drink plenty of fluids but you should avoid alcoholic beverages for 24 hours.  ACTIVITY:  You should plan to take it easy for the rest of today and you should NOT DRIVE or use heavy machinery until tomorrow (because of the sedation medicines used during the test).     FOLLOW UP: Our staff will call the number listed on your records the next business day following your procedure to check on you and address any questions or concerns that you may have regarding the information given to you following your procedure. If we do not reach you, we will leave a message.  However, if you are feeling well and you are not experiencing any problems, there is no need to return our call.  We will assume that you have returned to your regular daily activities without incident.  If any biopsies were taken you will be contacted by phone or by letter within the next 1-3 weeks.  Please call us at [phone] if you have not heard about the biopsies in 3 weeks.    SIGNATURES/CONFIDENTIALITY: You and/or your care partner have signed paperwork which will be entered into your electronic medical record.  These signatures attest to the fact that that the information above on your After Visit Summary has been reviewed and is understood.  Full responsibility of the confidentiality of this discharge information lies with you and/or your care-partner.  Thank you for letting us take care of your healthcare needs today. 

## 2017-03-05 ENCOUNTER — Telehealth: Payer: Self-pay | Admitting: *Deleted

## 2017-03-05 NOTE — Telephone Encounter (Signed)
  Follow up Call-  Call back number 03/04/2017  Post procedure Call Back phone  # [phone]  Permission to leave phone message Yes  Some recent data might be hidden     Patient questions:  Do you have a fever, pain , or abdominal swelling? No. Pain Score  0 *  Have you tolerated food without any problems? Yes.    Have you been able to return to your normal activities? Yes.    Do you have any questions about your discharge instructions: Diet   No. Medications  No. Follow up visit  No.  Do you have questions or concerns about your Care? No.  Actions: * If pain score is 4 or above: No action needed, pain <4.

## 2017-08-07 DIAGNOSIS — M1711 Unilateral primary osteoarthritis, right knee: Secondary | ICD-10-CM | POA: Diagnosis not present

## 2017-10-23 DIAGNOSIS — M25551 Pain in right hip: Secondary | ICD-10-CM | POA: Diagnosis not present

## 2017-10-23 DIAGNOSIS — M1711 Unilateral primary osteoarthritis, right knee: Secondary | ICD-10-CM | POA: Diagnosis not present

## 2017-10-29 DIAGNOSIS — M1611 Unilateral primary osteoarthritis, right hip: Secondary | ICD-10-CM | POA: Diagnosis not present

## 2017-12-15 DIAGNOSIS — M1711 Unilateral primary osteoarthritis, right knee: Secondary | ICD-10-CM | POA: Insufficient documentation

## 2017-12-16 ENCOUNTER — Ambulatory Visit (INDEPENDENT_AMBULATORY_CARE_PROVIDER_SITE_OTHER): Payer: Medicare Other | Admitting: Internal Medicine

## 2017-12-16 ENCOUNTER — Encounter: Payer: Self-pay | Admitting: Internal Medicine

## 2017-12-16 ENCOUNTER — Other Ambulatory Visit (INDEPENDENT_AMBULATORY_CARE_PROVIDER_SITE_OTHER): Payer: Medicare Other

## 2017-12-16 VITALS — BP 128/84 | HR 64 | Temp 97.6°F | Ht 70.0 in | Wt 236.0 lb

## 2017-12-16 DIAGNOSIS — N320 Bladder-neck obstruction: Secondary | ICD-10-CM

## 2017-12-16 DIAGNOSIS — Z23 Encounter for immunization: Secondary | ICD-10-CM

## 2017-12-16 DIAGNOSIS — F411 Generalized anxiety disorder: Secondary | ICD-10-CM

## 2017-12-16 DIAGNOSIS — R739 Hyperglycemia, unspecified: Secondary | ICD-10-CM | POA: Diagnosis not present

## 2017-12-16 DIAGNOSIS — E785 Hyperlipidemia, unspecified: Secondary | ICD-10-CM

## 2017-12-16 LAB — BASIC METABOLIC PANEL
BUN: 18 mg/dL (ref 6–23)
CALCIUM: 9.7 mg/dL (ref 8.4–10.5)
CO2: 30 meq/L (ref 19–32)
CREATININE: 1.01 mg/dL (ref 0.40–1.50)
Chloride: 104 mEq/L (ref 96–112)
GFR: 78.28 mL/min (ref >60.00)
Glucose, Bld: 82 mg/dL (ref 70–99)
Potassium: 4.9 mEq/L (ref 3.5–5.1)
Sodium: 140 mEq/L (ref 135–145)

## 2017-12-16 LAB — CBC WITH DIFFERENTIAL/PLATELET
BASOS PCT: 0.7 % (ref 0.0–3.0)
Basophils Absolute: 0 10*3/uL (ref 0.0–0.1)
EOS ABS: 0.1 10*3/uL (ref 0.0–0.7)
EOS PCT: 2.3 % (ref 0.0–5.0)
HEMATOCRIT: 43 % (ref 39.0–52.0)
HEMOGLOBIN: 15.1 g/dL (ref 13.0–17.0)
LYMPHS PCT: 30.6 % (ref 12.0–46.0)
Lymphs Abs: 1.6 10*3/uL (ref 0.7–4.0)
MCHC: 35 g/dL (ref 30.0–36.0)
MCV: 88.9 fl (ref 78.0–100.0)
MONO ABS: 0.5 10*3/uL (ref 0.1–1.0)
Monocytes Relative: 8.8 % (ref 3.0–12.0)
Neutro Abs: 3.1 10*3/uL (ref 1.4–7.7)
Neutrophils Relative %: 57.6 % (ref 43.0–77.0)
Platelets: 187 10*3/uL (ref 150.0–400.0)
RBC: 4.84 Mil/uL (ref 4.22–5.81)
RDW: 13.4 % (ref 11.5–15.5)
WBC: 5.4 10*3/uL (ref 4.0–10.5)

## 2017-12-16 LAB — URINALYSIS, ROUTINE W REFLEX MICROSCOPIC
Bilirubin Urine: NEGATIVE
Ketones, ur: NEGATIVE
LEUKOCYTES UA: NEGATIVE
Nitrite: NEGATIVE
RBC / HPF: NONE SEEN (ref 0–?)
SPECIFIC GRAVITY, URINE: 1.025 (ref 1.000–1.030)
TOTAL PROTEIN, URINE-UPE24: NEGATIVE
URINE GLUCOSE: NEGATIVE
Urobilinogen, UA: 0.2 (ref 0.0–1.0)
WBC, UA: NONE SEEN (ref 0–?)
pH: 6 (ref 5.0–8.0)

## 2017-12-16 LAB — LIPID PANEL
CHOL/HDL RATIO: 3
Cholesterol: 160 mg/dL (ref 0–200)
HDL: 62.9 mg/dL (ref >39.00)
LDL Cholesterol: 79 mg/dL (ref 0–99)
NONHDL: 96.86
Triglycerides: 88 mg/dL (ref 0.0–149.0)
VLDL: 17.6 mg/dL (ref 0.0–40.0)

## 2017-12-16 LAB — HEPATIC FUNCTION PANEL
ALT: 28 U/L (ref 0–53)
AST: 27 U/L (ref 0–37)
Albumin: 4.4 g/dL (ref 3.5–5.2)
Alkaline Phosphatase: 54 U/L (ref 39–117)
BILIRUBIN DIRECT: 0.1 mg/dL (ref 0.0–0.3)
BILIRUBIN TOTAL: 0.7 mg/dL (ref 0.2–1.2)
Total Protein: 6.6 g/dL (ref 6.0–8.3)

## 2017-12-16 LAB — HEMOGLOBIN A1C: HEMOGLOBIN A1C: 5.5 % (ref 4.6–6.5)

## 2017-12-16 LAB — PSA: PSA: 0.77 ng/mL (ref 0.10–4.00)

## 2017-12-16 LAB — TSH: TSH: 2.45 u[IU]/mL (ref 0.35–4.50)

## 2017-12-16 NOTE — Progress Notes (Signed)
Subjective:    Patient ID: Todd Barrett., male    DOB: Apr 02, 1951, 67 y.o.   MRN: [id number]  HPI  Here for yearly f/u;  Overall doing ok;  Pt denies Chest pain, worsening SOB, DOE, wheezing, orthopnea, PND, worsening LE edema, palpitations, dizziness or syncope.  Pt denies neurological change such as new headache, facial or extremity weakness.  Pt denies polydipsia, polyuria, or low sugar symptoms. Pt states overall good compliance with treatment and medications, good tolerability, and has been trying to follow appropriate diet.  Pt denies worsening depressive symptoms, suicidal ideation or panic. No fever, night sweats, wt loss, loss of appetite, or other constitutional symptoms.  Pt states good ability with ADL's, has low fall risk, home safety reviewed and adequate, no other significant changes in hearing or vision, and only occasionally active with exercise.  Due for right knee replacement in 1 mo.  Also s/p left knee TKR, and hx of numerous arthroscopies.   No other complaints or interval hx Past Medical History:  Diagnosis Date  . ALLERGIC RHINITIS 01/08/2010  . Allergy    dust mites, pet dander  . ANEMIA-NOS 01/08/2010  . ANXIETY 01/08/2010  . CERVICAL RADICULOPATHY, RIGHT 01/08/2010  . CHEST PAIN 01/08/2010  . DIVERTICULOSIS, COLON 01/08/2010  . GERD (gastroesophageal reflux disease)    Zantac OTC  prn  . GOUT 01/08/2010   in past/   . HYPERLIPIDEMIA 01/08/2010  . Lumbar disc disease 08/05/2011  . 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 Shortness of breath    with exertion   Past Surgical History:  Procedure Laterality Date  . ANTERIOR CERVICAL DECOMP/DISCECTOMY FUSION  07/23/2012   Procedure: ANTERIOR CERVICAL DECOMPRESSION/DISCECTOMY FUSION 2 LEVELS;  Surgeon: Clydene Fake, MD;  Location: [facility];  Service: Neurosurgery;  Laterality: N/A;  Cervical three-four, four-five, redo Cervical six-seven anterior cervical decompression, discectomy fusion with allograft  and plating  . BACK SURGERY  03/2004, 02/2005  . CERVICAL SPINE SURGERY  05/2010  . clavicle facture  nov. 2009   100% displacement  . COLONOSCOPY    . HERNIA REPAIR     inguinal hernia/right side/ 67 years old  . KNEE ARTHROSCOPY     total of 4  . left knee arthroscopy  september 2005  . lumbar disc surgury  02/2002  . lumbar disc surgury  june 2004  . NASAL RECONSTRUCTION     x 3  . s/p ENT surgury     x 3  . s/p left shoulder surgury  feb. 2009  . s/p left TKR  april 2006    reports that  has never smoked. he has never used smokeless tobacco. He reports that he drinks about 7.2 oz of alcohol per week. He reports that he does not use drugs. family history includes Alzheimer's disease in his mother; Lymphoma in his mother; Pancreatic cancer in his brother. Allergies  Allergen Reactions  . Dust Mite Extract Other (See Comments)    Nasal Congestion   Current Outpatient Medications on File Prior to Visit  Medication Sig Dispense Refill  . atorvastatin (LIPITOR) 10 MG tablet TAKE 1 TABLET BY MOUTH EVERY DAY 90 tablet 3  . Calcium Polycarbophil (FIBER-CAPS PO) Take by mouth daily. Take 2 daily    . fexofenadine (ALLEGRA) 180 MG tablet Take 180 mg by mouth daily.     . fluticasone (FLONASE) 50 MCG/ACT nasal spray Place 1 spray into the nose daily as needed. For allergies.     . Omega-3  Fatty Acids (FISH OIL) 1000 MG CAPS Take by mouth daily.      Current Facility-Administered Medications on File Prior to Visit  Medication Dose Route Frequency Provider Last Rate Last Dose  . 0.9 %  sodium chloride infusion  500 mL Intravenous Continuous Hilarie FredricksonPerry, [name] N, MD       Review of Systems Constitutional: Negative for other unusual diaphoresis, sweats, appetite or weight changes HENT: Negative for other worsening hearing loss, ear pain, facial swelling, mouth sores or neck stiffness.   Eyes: Negative for other worsening pain, redness or other visual disturbance.  Respiratory: Negative for other  stridor or swelling Cardiovascular: Negative for other palpitations or other chest pain  Gastrointestinal: Negative for worsening diarrhea or loose stools, blood in stool, distention or other pain Genitourinary: Negative for hematuria, flank pain or other change in urine volume.  Musculoskeletal: Negative for myalgias or other joint swelling.  Skin: Negative for other color change, or other wound or worsening drainage.  Neurological: Negative for other syncope or numbness. Hematological: Negative for other adenopathy or swelling Psychiatric/Behavioral: Negative for hallucinations, other worsening agitation, SI, self-injury, or new decreased concentration All other system neg per pt    Objective:   Physical Exam BP 128/84   Pulse 64   Temp 97.6 F (36.4 C) (Oral)   Ht 5\' 10"  (1.778 m)   Wt 236 lb (107 kg)   SpO2 98%   BMI 33.86 kg/m  VS noted,  Constitutional: Pt is oriented to person, place, and time. Appears well-developed and well-nourished, in no significant distress and comfortable Head: Normocephalic and atraumatic  Eyes: Conjunctivae and EOM are normal. Pupils are equal, round, and reactive to light Right Ear: External ear normal without discharge Left Ear: External ear normal without discharge Nose: Nose without discharge or deformity Mouth/Throat: Oropharynx is without other ulcerations and moist  Neck: Normal range of motion. Neck supple. No JVD present. No tracheal deviation present or significant neck LA or mass Cardiovascular: Normal rate, regular rhythm, normal heart sounds and intact distal pulses.   Pulmonary/Chest: WOB normal and breath sounds without rales or wheezing  Abdominal: Soft. Bowel sounds are normal. NT. No HSM  Musculoskeletal: Normal range of motion. Exhibits no edema Lymphadenopathy: Has no other cervical adenopathy.  Neurological: Pt is alert and oriented to person, place, and time. Pt has normal reflexes. No cranial nerve deficit. Motor grossly  intact, Gait intact Skin: Skin is warm and dry. No rash noted or new ulcerations Psychiatric:  Has mild nervous mood and affect. Behavior is normal without agitation No other exam findings Lab Results  Component Value Date   WBC 5.4 12/16/2017   HGB 15.1 12/16/2017   HCT 43.0 12/16/2017   PLT 187.0 12/16/2017   GLUCOSE 82 12/16/2017   CHOL 160 12/16/2017   TRIG 88.0 12/16/2017   HDL 62.90 12/16/2017   LDLDIRECT 142.0 09/29/2012   LDLCALC 79 12/16/2017   ALT 28 12/16/2017   AST 27 12/16/2017   NA 140 12/16/2017   K 4.9 12/16/2017   CL 104 12/16/2017   CREATININE 1.01 12/16/2017   BUN 18 12/16/2017   CO2 30 12/16/2017   TSH 2.45 12/16/2017   PSA 0.77 12/16/2017   INR 1.03 07/23/2012   HGBA1C 5.5 12/16/2017   MICROALBUR 1.0 11/27/2016       Assessment & Plan:

## 2017-12-16 NOTE — Patient Instructions (Addendum)

## 2017-12-17 ENCOUNTER — Encounter: Payer: Self-pay | Admitting: Internal Medicine

## 2017-12-17 NOTE — Assessment & Plan Note (Signed)
stable overall by history and exam, recent data reviewed with pt, and pt to continue medical treatment as before,  to f/u any worsening symptoms or concerns  

## 2017-12-17 NOTE — Assessment & Plan Note (Signed)
Lab Results  Component Value Date   HGBA1C 5.5 12/16/2017  stable overall by history and exam, recent data reviewed with pt, and pt to continue medical treatment as before,  to f/u any worsening symptoms or concerns

## 2017-12-17 NOTE — Assessment & Plan Note (Signed)
Lab Results  Component Value Date   LDLCALC 79 12/16/2017  .stable overall by history and exam, recent data reviewed with pt, and pt to continue medical treatment as before,  to f/u any worsening symptoms or concerns

## 2017-12-25 NOTE — H&P (Signed)
Todd Gertie ExonK Nary Jr. is an 67 y.o. male.    Chief Complaint: right knee pain  HPI: Pt is a 67 y.o. male complaining of right knee pain for multiple years. Pain had continually increased since the beginning. X-rays in the clinic show end-stage arthritic changes of the right knee. Pt has tried various conservative treatments which have failed to alleviate their symptoms, including injections and therapy. Various options are discussed with the patient. Risks, benefits and expectations were discussed with the patient. Patient understand the risks, benefits and expectations and wishes to proceed with surgery.   PCP:  Todd LevinsJohn, [name], MD  D/C Plans: Home  PMH: Past Medical History:  Diagnosis Date  . ALLERGIC RHINITIS 01/08/2010  . Allergy    dust mites, pet dander  . ANEMIA-NOS 01/08/2010  . ANXIETY 01/08/2010  . CERVICAL RADICULOPATHY, RIGHT 01/08/2010  . CHEST PAIN 01/08/2010  . DIVERTICULOSIS, COLON 01/08/2010  . GERD (gastroesophageal reflux disease)    Zantac OTC  prn  . GOUT 01/08/2010   in past/   . HYPERLIPIDEMIA 01/08/2010  . Lumbar disc disease 08/05/2011  . 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 Shortness of breath    with exertion    PSH: Past Surgical History:  Procedure Laterality Date  . ANTERIOR CERVICAL DECOMP/DISCECTOMY FUSION  07/23/2012   Procedure: ANTERIOR CERVICAL DECOMPRESSION/DISCECTOMY FUSION 2 LEVELS;  Surgeon: Clydene FakeJames R Hirsch, MD;  Location: [facility];  Service: Neurosurgery;  Laterality: Barrett/A;  Cervical three-four, four-five, redo Cervical six-seven anterior cervical decompression, discectomy fusion with allograft and plating  . BACK SURGERY  03/2004, 02/2005  . CERVICAL SPINE SURGERY  05/2010  . clavicle facture  nov. 2009   100% displacement  . COLONOSCOPY    . HERNIA REPAIR     inguinal hernia/right side/ 67 years old  . KNEE ARTHROSCOPY     total of 4  . left knee arthroscopy  september 2005  . lumbar disc surgury  02/2002  . lumbar disc  surgury  june 2004  . NASAL RECONSTRUCTION     x 3  . s/p ENT surgury     x 3  . s/p left shoulder surgury  feb. 2009  . s/p left TKR  april 2006    Social History:  reports that  has never smoked. he has never used smokeless tobacco. He reports that he drinks about 7.2 oz of alcohol per week. He reports that he does not use drugs.  Allergies:  Allergies  Allergen Reactions  . Dust Mite Extract Other (See Comments)    Nasal Congestion    Medications: Current Facility-Administered Medications  Medication Dose Route Frequency Provider Last Rate Last Dose  . 0.9 %  sodium chloride infusion  500 mL Intravenous Continuous Todd FredricksonPerry, [name] N, MD       Current Outpatient Medications  Medication Sig Dispense Refill  . atorvastatin (LIPITOR) 10 MG tablet TAKE 1 TABLET BY MOUTH EVERY DAY 90 tablet 3  . Calcium Polycarbophil (FIBER-CAPS PO) Take by mouth daily. Take 2 daily    . fexofenadine (ALLEGRA) 180 MG tablet Take 180 mg by mouth daily.     . fluticasone (FLONASE) 50 MCG/ACT nasal spray Place 1 spray into the nose daily as needed. For allergies.     . Omega-3 Fatty Acids (FISH OIL) 1000 MG CAPS Take by mouth daily.       No results found for this or any previous visit (from the past 48 hour(s)). No results found.  ROS: Pain  with rom of the right lower extremity  Physical Exam:  Alert and oriented 67 y.o. male in no acute distress Cranial nerves 2-12 intact Cervical spine: full rom with no tenderness, nv intact distally Chest: active breath sounds bilaterally, no wheeze rhonchi or rales Heart: regular rate and rhythm, no murmur Abd: non tender non distended with active bowel sounds Hip is stable with rom  Right knee with medial and lateral joint line tenderness nv intact distally Antalgic gait No rashes or edema  Assessment/Plan Assessment: right knee end stage osteoarthritis  Plan: Patient will undergo a right total knee by Dr. Ranell Patrick at [facility]. Risks benefits and  expectations were discussed with the patient. Patient understand risks, benefits and expectations and wishes to proceed.  Alphonsa Overall PA-C, MPAS [facility] is now [employer] [address], [place], [place] [address] Phone: [phone] [email or web address] Facebook  [employer]

## 2017-12-30 ENCOUNTER — Other Ambulatory Visit: Payer: Self-pay | Admitting: Internal Medicine

## 2018-01-08 ENCOUNTER — Encounter (HOSPITAL_COMMUNITY)
Admission: RE | Admit: 2018-01-08 | Discharge: 2018-01-08 | Disposition: A | Discharge status: Home / Self Care | Payer: Medicare Other | Source: Ambulatory Visit | Attending: Orthopedic Surgery | Admitting: Orthopedic Surgery

## 2018-01-08 ENCOUNTER — Other Ambulatory Visit: Payer: Self-pay

## 2018-01-08 ENCOUNTER — Encounter (HOSPITAL_COMMUNITY): Payer: Self-pay

## 2018-01-08 DIAGNOSIS — Z01812 Encounter for preprocedural laboratory examination: Secondary | ICD-10-CM | POA: Diagnosis not present

## 2018-01-08 DIAGNOSIS — R739 Hyperglycemia, unspecified: Secondary | ICD-10-CM | POA: Diagnosis not present

## 2018-01-08 DIAGNOSIS — M5412 Radiculopathy, cervical region: Secondary | ICD-10-CM | POA: Diagnosis not present

## 2018-01-08 DIAGNOSIS — F411 Generalized anxiety disorder: Secondary | ICD-10-CM | POA: Diagnosis not present

## 2018-01-08 DIAGNOSIS — K5730 Diverticulosis of large intestine without perforation or abscess without bleeding: Secondary | ICD-10-CM | POA: Diagnosis not present

## 2018-01-08 DIAGNOSIS — E785 Hyperlipidemia, unspecified: Secondary | ICD-10-CM | POA: Insufficient documentation

## 2018-01-08 DIAGNOSIS — M170 Bilateral primary osteoarthritis of knee: Secondary | ICD-10-CM | POA: Insufficient documentation

## 2018-01-08 DIAGNOSIS — J309 Allergic rhinitis, unspecified: Secondary | ICD-10-CM | POA: Insufficient documentation

## 2018-01-08 DIAGNOSIS — M109 Gout, unspecified: Secondary | ICD-10-CM | POA: Diagnosis not present

## 2018-01-08 HISTORY — DX: Unilateral primary osteoarthritis, right knee: M17.11

## 2018-01-08 LAB — COMPREHENSIVE METABOLIC PANEL WITH GFR
ALT: 34 U/L (ref 17–63)
AST: 35 U/L (ref 15–41)
Albumin: 4.2 g/dL (ref 3.5–5.0)
Alkaline Phosphatase: 55 U/L (ref 38–126)
Anion gap: 10 (ref 5–15)
BUN: 16 mg/dL (ref 6–20)
CO2: 26 mmol/L (ref 22–32)
Calcium: 9.6 mg/dL (ref 8.9–10.3)
Chloride: 103 mmol/L (ref 101–111)
Creatinine, Ser: 1.25 mg/dL — ABNORMAL HIGH (ref 0.61–1.24)
GFR calc Af Amer: >60 mL/min
GFR calc non Af Amer: 58 mL/min — ABNORMAL LOW
Glucose, Bld: 153 mg/dL — ABNORMAL HIGH (ref 65–99)
Potassium: 4.3 mmol/L (ref 3.5–5.1)
Sodium: 139 mmol/L (ref 135–145)
Total Bilirubin: 1.2 mg/dL (ref 0.3–1.2)
Total Protein: 6.8 g/dL (ref 6.5–8.1)

## 2018-01-08 LAB — CBC
HCT: 44.6 % (ref 39.0–52.0)
Hemoglobin: 15.6 g/dL (ref 13.0–17.0)
MCH: 30.8 pg (ref 26.0–34.0)
MCHC: 35 g/dL (ref 30.0–36.0)
MCV: 88.1 fL (ref 78.0–100.0)
Platelets: 185 10*3/uL (ref 150–400)
RBC: 5.06 MIL/uL (ref 4.22–5.81)
RDW: 12.8 % (ref 11.5–15.5)
WBC: 7.7 10*3/uL (ref 4.0–10.5)

## 2018-01-08 LAB — SURGICAL PCR SCREEN
MRSA, PCR: NEGATIVE
STAPHYLOCOCCUS AUREUS: POSITIVE — AB

## 2018-01-08 NOTE — Progress Notes (Signed)
[name] made aware of the positive staph pcr result. [facility].

## 2018-01-08 NOTE — Pre-Procedure Instructions (Signed)
Todd Barrett.  01/08/2018      [id number] [place]- [place], [place] - [address] [place] [id number] Phone: [phone] Fax: [phone]    Your procedure is scheduled on Friday, January 16, 2018  [facility] Ad[facility] "A" at  5:30AM  Call this number if you have problems the morning of surgery:  [phone]   Remember:  Do not eat food or drink liquids after midnight.  Take these medicines the morning of surgery with A SIP OF WATER: Fexofenadine (ALLEGRA) and Fluticasone (FLONASE). If needed Ranitidine (ZANTAC) for acid relief.  7 days before surgery (Feb. 15) , stop taking all Aspirins, Vitamins, Fish oils, and Herbal medications. Also stop all NSAIDS i.e. Advil, Ibuprofen, Motrin, Aleve, Anaprox, Naproxen, BC and Goody Powders.   Do not wear jewelry.  Do not wear lotions, powders, colognes, or deodorant.  Do not shave 48 hours prior to surgery.  Men may shave face and neck.  Do not bring valuables to the hospital.  [facility] any belongings or valuables.  Contacts, dentures or bridgework may not be worn into surgery.  Leave your suitcase in the car.  After surgery it may be brought to your room.  For patients admitted to the hospital, discharge time will be determined by your treatment team.  Patients discharged the day of surgery will not be allowed to drive home.   Special instructions:   Tool- Preparing For Surgery  Before surgery, you can play an important role. Because skin is not sterile, your skin needs to be as free of germs as possible. You can reduce the number of germs on your skin by washing with CHG (chlorahexidine gluconate) Soap before surgery.  CHG is an antiseptic cleaner which kills germs and bonds with the skin to continue killing germs even after washing.  Please do not use if you have an allergy to CHG or antibacterial soaps. If your skin becomes  reddened/irritated stop using the CHG.  Do not shave (including legs and underarms) for at least 48 hours prior to first CHG shower. It is OK to shave your face.  Please follow these instructions carefully.   1. Shower the NIGHT BEFORE SURGERY and the MORNING OF SURGERY with CHG.   2. If you chose to wash your hair, wash your hair first as usual with your normal shampoo.  3. After you shampoo, rinse your hair and body thoroughly to remove the shampoo.  4. Use CHG as you would any other liquid soap. You can apply CHG directly to the skin and wash gently with a scrungie or a clean washcloth.   5. Apply the CHG Soap to your body ONLY FROM THE NECK DOWN.  Do not use on open wounds or open sores. Avoid contact with your eyes, ears, mouth and genitals (private parts). Wash Face and genitals (private parts)  with your normal soap.  6. Wash thoroughly, paying special attention to the area where your surgery will be performed.  7. Thoroughly rinse your body with warm water from the neck down.  8. DO NOT shower/wash with your normal soap after using and rinsing off the CHG Soap.  9. Pat yourself dry with a CLEAN TOWEL.  10. Wear CLEAN PAJAMAS to bed the night before surgery, wear comfortable clothes the morning of surgery  11. Place CLEAN SHEETS on your bed the night of your first shower and DO NOT SLEEP WITH  PETS.  Day of Surgery: Do not apply any deodorants/lotions. Please wear clean clothes to the hospital/surgery center.    Please read over the following fact sheets that you were given. Pain Booklet, Coughing and Deep Breathing, Total Joint Packet, MRSA Information and Surgical Site Infection Prevention

## 2018-01-08 NOTE — Progress Notes (Signed)
PCP - Dr. Oliver BarreJames [name]- Powers  Cardiologist - Denies  Chest x-ray - Denies  EKG - Denies  Stress Test - 10 yrs ago  ECHO - Denies  Cardiac Cath - Denies  Sleep Study - No CPAP - None  LABS- 01/08/18: CBC, CMP  Anesthesia- No  Pt denies having chest pain, sob, or fever at this time. All instructions explained to the pt, with a verbal understanding of the material. Pt agrees to go over the instructions while at home for a better understanding. The opportunity to ask questions was provided.

## 2018-01-15 MED ORDER — TRANEXAMIC ACID 1000 MG/10ML IV SOLN
1000.0000 mg | INTRAVENOUS | Status: AC
  Administered 2018-01-16: 1000 mg via INTRAVENOUS
  Filled 2018-01-15: qty 10

## 2018-01-16 ENCOUNTER — Encounter (HOSPITAL_COMMUNITY): Payer: Self-pay

## 2018-01-16 ENCOUNTER — Encounter (HOSPITAL_COMMUNITY)
Admission: RE | Disposition: A | Discharge status: Home / Self Care | Payer: Self-pay | Source: Ambulatory Visit | Attending: Orthopedic Surgery

## 2018-01-16 ENCOUNTER — Other Ambulatory Visit: Payer: Self-pay

## 2018-01-16 ENCOUNTER — Inpatient Hospital Stay (HOSPITAL_COMMUNITY): Payer: Medicare Other

## 2018-01-16 ENCOUNTER — Inpatient Hospital Stay (HOSPITAL_COMMUNITY)
Admission: RE | Admit: 2018-01-16 | Discharge: 2018-01-20 | DRG: 470 | Disposition: A | Discharge status: Home / Self Care | Payer: Medicare Other | Source: Ambulatory Visit | Attending: Orthopedic Surgery | Admitting: Orthopedic Surgery

## 2018-01-16 ENCOUNTER — Inpatient Hospital Stay (HOSPITAL_COMMUNITY): Payer: Medicare Other | Admitting: Certified Registered"

## 2018-01-16 DIAGNOSIS — E785 Hyperlipidemia, unspecified: Secondary | ICD-10-CM | POA: Diagnosis present

## 2018-01-16 DIAGNOSIS — Z96652 Presence of left artificial knee joint: Secondary | ICD-10-CM | POA: Diagnosis present

## 2018-01-16 DIAGNOSIS — G8918 Other acute postprocedural pain: Secondary | ICD-10-CM | POA: Diagnosis not present

## 2018-01-16 DIAGNOSIS — K219 Gastro-esophageal reflux disease without esophagitis: Secondary | ICD-10-CM | POA: Diagnosis present

## 2018-01-16 DIAGNOSIS — M170 Bilateral primary osteoarthritis of knee: Secondary | ICD-10-CM | POA: Diagnosis not present

## 2018-01-16 DIAGNOSIS — I480 Paroxysmal atrial fibrillation: Secondary | ICD-10-CM | POA: Diagnosis not present

## 2018-01-16 DIAGNOSIS — I4891 Unspecified atrial fibrillation: Secondary | ICD-10-CM | POA: Diagnosis not present

## 2018-01-16 DIAGNOSIS — M1711 Unilateral primary osteoarthritis, right knee: Principal | ICD-10-CM | POA: Diagnosis present

## 2018-01-16 DIAGNOSIS — D649 Anemia, unspecified: Secondary | ICD-10-CM | POA: Diagnosis not present

## 2018-01-16 DIAGNOSIS — Z96651 Presence of right artificial knee joint: Secondary | ICD-10-CM

## 2018-01-16 DIAGNOSIS — Z471 Aftercare following joint replacement surgery: Secondary | ICD-10-CM | POA: Diagnosis not present

## 2018-01-16 DIAGNOSIS — M25561 Pain in right knee: Secondary | ICD-10-CM | POA: Diagnosis not present

## 2018-01-16 HISTORY — PX: TOTAL KNEE ARTHROPLASTY: SHX125

## 2018-01-16 HISTORY — DX: Unspecified atrial fibrillation: I48.91

## 2018-01-16 SURGERY — ARTHROPLASTY, KNEE, TOTAL
Anesthesia: General | Site: Knee | Laterality: Right

## 2018-01-16 MED ORDER — BISACODYL 10 MG RE SUPP: 10.0000 mg | Freq: Every day | RECTAL | Status: DC | PRN

## 2018-01-16 MED ORDER — METHOCARBAMOL 500 MG PO TABS
500.0000 mg | ORAL_TABLET | Freq: Four times a day (QID) | ORAL | Status: DC | PRN
  Administered 2018-01-17 – 2018-01-20 (×8): 500 mg via ORAL
  Filled 2018-01-16 (×8): qty 1

## 2018-01-16 MED ORDER — LIDOCAINE 2% (20 MG/ML) 5 ML SYRINGE
INTRAMUSCULAR | Status: DC | PRN
  Administered 2018-01-16: 60 mg via INTRAVENOUS

## 2018-01-16 MED ORDER — FENTANYL CITRATE (PF) 100 MCG/2ML IJ SOLN
INTRAMUSCULAR | Status: AC
  Filled 2018-01-16: qty 2

## 2018-01-16 MED ORDER — ACETAMINOPHEN 650 MG RE SUPP: 650.0000 mg | RECTAL | Status: DC | PRN

## 2018-01-16 MED ORDER — MIDAZOLAM HCL 2 MG/2ML IJ SOLN
INTRAMUSCULAR | Status: AC
  Filled 2018-01-16: qty 2

## 2018-01-16 MED ORDER — FENTANYL CITRATE (PF) 250 MCG/5ML IJ SOLN
INTRAMUSCULAR | Status: AC
  Filled 2018-01-16: qty 5

## 2018-01-16 MED ORDER — CEFAZOLIN SODIUM-DEXTROSE 2-4 GM/100ML-% IV SOLN
2.0000 g | Freq: Four times a day (QID) | INTRAVENOUS | Status: AC
  Administered 2018-01-16 (×2): 2 g via INTRAVENOUS
  Filled 2018-01-16 (×2): qty 100

## 2018-01-16 MED ORDER — FENTANYL CITRATE (PF) 100 MCG/2ML IJ SOLN
25.0000 ug | INTRAMUSCULAR | Status: DC | PRN
  Administered 2018-01-16 (×2): 25 ug via INTRAVENOUS
  Administered 2018-01-16: 50 ug via INTRAVENOUS
  Administered 2018-01-16 (×2): 25 ug via INTRAVENOUS

## 2018-01-16 MED ORDER — HYDROMORPHONE HCL 1 MG/ML IJ SOLN
INTRAMUSCULAR | Status: DC | PRN
  Administered 2018-01-16 (×3): 0.5 mg via INTRAVENOUS

## 2018-01-16 MED ORDER — PROPOFOL 10 MG/ML IV BOLUS
INTRAVENOUS | Status: DC | PRN
  Administered 2018-01-16: 180 mg via INTRAVENOUS

## 2018-01-16 MED ORDER — MIDAZOLAM HCL 5 MG/5ML IJ SOLN
INTRAMUSCULAR | Status: DC | PRN
  Administered 2018-01-16: 2 mg via INTRAVENOUS

## 2018-01-16 MED ORDER — ATORVASTATIN CALCIUM 10 MG PO TABS
10.0000 mg | ORAL_TABLET | Freq: Every day | ORAL | Status: DC
  Administered 2018-01-16 – 2018-01-19 (×4): 10 mg via ORAL
  Filled 2018-01-16 (×4): qty 1

## 2018-01-16 MED ORDER — ROCURONIUM BROMIDE 100 MG/10ML IV SOLN
INTRAVENOUS | Status: DC | PRN
  Administered 2018-01-16: 60 mg via INTRAVENOUS
  Administered 2018-01-16 (×2): 10 mg via INTRAVENOUS

## 2018-01-16 MED ORDER — CEFAZOLIN SODIUM-DEXTROSE 2-4 GM/100ML-% IV SOLN
INTRAVENOUS | Status: AC
  Filled 2018-01-16: qty 100

## 2018-01-16 MED ORDER — HYDROMORPHONE HCL 1 MG/ML IJ SOLN
INTRAMUSCULAR | Status: AC
  Filled 2018-01-16: qty 0.5

## 2018-01-16 MED ORDER — FERROUS SULFATE 325 (65 FE) MG PO TABS
325.0000 mg | ORAL_TABLET | Freq: Three times a day (TID) | ORAL | Status: DC
  Administered 2018-01-16 – 2018-01-20 (×13): 325 mg via ORAL
  Filled 2018-01-16 (×14): qty 1

## 2018-01-16 MED ORDER — MEPERIDINE HCL 50 MG/ML IJ SOLN: 6.2500 mg | INTRAMUSCULAR | Status: DC | PRN

## 2018-01-16 MED ORDER — ASPIRIN 81 MG PO CHEW: 81.0000 mg | CHEWABLE_TABLET | Freq: Two times a day (BID) | ORAL | 0 refills | Status: DC

## 2018-01-16 MED ORDER — ROPIVACAINE HCL 7.5 MG/ML IJ SOLN
INTRAMUSCULAR | Status: DC | PRN
  Administered 2018-01-16: 20 mL via PERINEURAL

## 2018-01-16 MED ORDER — OXYCODONE HCL 5 MG PO TABS: 5.0000 mg | ORAL_TABLET | ORAL | Status: DC | PRN

## 2018-01-16 MED ORDER — ONDANSETRON HCL 4 MG/2ML IJ SOLN
INTRAMUSCULAR | Status: DC | PRN
  Administered 2018-01-16: 4 mg via INTRAVENOUS

## 2018-01-16 MED ORDER — FAMOTIDINE 20 MG PO TABS
20.0000 mg | ORAL_TABLET | Freq: Every day | ORAL | Status: DC
  Administered 2018-01-16 – 2018-01-20 (×5): 20 mg via ORAL
  Filled 2018-01-16 (×5): qty 1

## 2018-01-16 MED ORDER — OXYCODONE HCL 5 MG PO TABS
10.0000 mg | ORAL_TABLET | ORAL | Status: DC | PRN
  Administered 2018-01-17 – 2018-01-20 (×18): 10 mg via ORAL
  Filled 2018-01-16 (×18): qty 2

## 2018-01-16 MED ORDER — METOCLOPRAMIDE HCL 5 MG/ML IJ SOLN: 5.0000 mg | Freq: Three times a day (TID) | INTRAMUSCULAR | Status: DC | PRN

## 2018-01-16 MED ORDER — DEXAMETHASONE SODIUM PHOSPHATE 10 MG/ML IJ SOLN
INTRAMUSCULAR | Status: AC
  Filled 2018-01-16: qty 1

## 2018-01-16 MED ORDER — CHLORHEXIDINE GLUCONATE 4 % EX LIQD: 60.0000 mL | Freq: Once | CUTANEOUS | Status: DC

## 2018-01-16 MED ORDER — HYDROMORPHONE HCL 1 MG/ML IJ SOLN
0.5000 mg | INTRAMUSCULAR | Status: DC | PRN
  Administered 2018-01-16: 1 mg via INTRAVENOUS
  Administered 2018-01-16: 2 mg via INTRAVENOUS
  Administered 2018-01-16 (×2): 1 mg via INTRAVENOUS
  Administered 2018-01-17 – 2018-01-19 (×14): 2 mg via INTRAVENOUS
  Administered 2018-01-19: 1 mg via INTRAVENOUS
  Administered 2018-01-20: 2 mg via INTRAVENOUS
  Filled 2018-01-16 (×6): qty 2
  Filled 2018-01-16: qty 1
  Filled 2018-01-16 (×5): qty 2
  Filled 2018-01-16: qty 1
  Filled 2018-01-16: qty 2
  Filled 2018-01-16: qty 1
  Filled 2018-01-16 (×3): qty 2
  Filled 2018-01-16: qty 1
  Filled 2018-01-16: qty 2

## 2018-01-16 MED ORDER — PHENOL 1.4 % MT LIQD: 1.0000 | OROMUCOSAL | Status: DC | PRN

## 2018-01-16 MED ORDER — ONDANSETRON HCL 4 MG/2ML IJ SOLN: 4.0000 mg | Freq: Four times a day (QID) | INTRAMUSCULAR | Status: DC | PRN

## 2018-01-16 MED ORDER — ONDANSETRON HCL 4 MG PO TABS: 4.0000 mg | ORAL_TABLET | Freq: Four times a day (QID) | ORAL | Status: DC | PRN

## 2018-01-16 MED ORDER — FENTANYL CITRATE (PF) 100 MCG/2ML IJ SOLN
INTRAMUSCULAR | Status: DC | PRN
  Administered 2018-01-16 (×2): 50 ug via INTRAVENOUS
  Administered 2018-01-16: 100 ug via INTRAVENOUS
  Administered 2018-01-16: 50 ug via INTRAVENOUS

## 2018-01-16 MED ORDER — METOCLOPRAMIDE HCL 5 MG PO TABS: 5.0000 mg | ORAL_TABLET | Freq: Three times a day (TID) | ORAL | Status: DC | PRN

## 2018-01-16 MED ORDER — LIDOCAINE 2% (20 MG/ML) 5 ML SYRINGE
INTRAMUSCULAR | Status: AC
  Filled 2018-01-16: qty 5

## 2018-01-16 MED ORDER — LACTATED RINGERS IV SOLN
INTRAVENOUS | Status: DC
  Administered 2018-01-16: 125 mL/h via INTRAVENOUS

## 2018-01-16 MED ORDER — SODIUM CHLORIDE 0.9 % IR SOLN
Status: DC | PRN
  Administered 2018-01-16: 3000 mL

## 2018-01-16 MED ORDER — POLYETHYLENE GLYCOL 3350 17 G PO PACK: 17.0000 g | PACK | Freq: Every day | ORAL | Status: DC | PRN

## 2018-01-16 MED ORDER — CEFAZOLIN SODIUM-DEXTROSE 2-4 GM/100ML-% IV SOLN
2.0000 g | INTRAVENOUS | Status: AC
  Administered 2018-01-16: 2 g via INTRAVENOUS

## 2018-01-16 MED ORDER — ASPIRIN 81 MG PO CHEW
81.0000 mg | CHEWABLE_TABLET | Freq: Two times a day (BID) | ORAL | Status: DC
  Administered 2018-01-16 – 2018-01-19 (×6): 81 mg via ORAL
  Filled 2018-01-16 (×6): qty 1

## 2018-01-16 MED ORDER — METHOCARBAMOL 500 MG PO TABS: 500.0000 mg | ORAL_TABLET | Freq: Three times a day (TID) | ORAL | 1 refills | Status: DC | PRN

## 2018-01-16 MED ORDER — 0.9 % SODIUM CHLORIDE (POUR BTL) OPTIME
TOPICAL | Status: DC | PRN
  Administered 2018-01-16: 1000 mL

## 2018-01-16 MED ORDER — METOCLOPRAMIDE HCL 5 MG/ML IJ SOLN: 10.0000 mg | Freq: Once | INTRAMUSCULAR | Status: DC | PRN

## 2018-01-16 MED ORDER — CALCIUM POLYCARBOPHIL 625 MG PO TABS
1250.0000 mg | ORAL_TABLET | Freq: Every day | ORAL | Status: DC
  Administered 2018-01-17 – 2018-01-20 (×4): 1250 mg via ORAL
  Filled 2018-01-16 (×4): qty 2

## 2018-01-16 MED ORDER — METHOCARBAMOL 1000 MG/10ML IJ SOLN
500.0000 mg | Freq: Four times a day (QID) | INTRAVENOUS | Status: DC | PRN
  Filled 2018-01-16: qty 5

## 2018-01-16 MED ORDER — TRANEXAMIC ACID 1000 MG/10ML IV SOLN
1000.0000 mg | Freq: Once | INTRAVENOUS | Status: AC
  Administered 2018-01-16: 1000 mg via INTRAVENOUS
  Filled 2018-01-16: qty 10

## 2018-01-16 MED ORDER — SUGAMMADEX SODIUM 200 MG/2ML IV SOLN
INTRAVENOUS | Status: AC
Start: 2018-01-16 — End: 2018-01-16
  Filled 2018-01-16: qty 2

## 2018-01-16 MED ORDER — DOCUSATE SODIUM 100 MG PO CAPS
100.0000 mg | ORAL_CAPSULE | Freq: Two times a day (BID) | ORAL | Status: DC
  Administered 2018-01-16 – 2018-01-20 (×9): 100 mg via ORAL
  Filled 2018-01-16 (×9): qty 1

## 2018-01-16 MED ORDER — OXYCODONE HCL 5 MG PO TABS: 5.0000 mg | ORAL_TABLET | ORAL | 0 refills | Status: DC | PRN

## 2018-01-16 MED ORDER — LACTATED RINGERS IV SOLN
INTRAVENOUS | Status: DC | PRN
  Administered 2018-01-16 (×2): via INTRAVENOUS

## 2018-01-16 MED ORDER — FLUTICASONE PROPIONATE 50 MCG/ACT NA SUSP
2.0000 | Freq: Every day | NASAL | Status: DC
  Administered 2018-01-16 – 2018-01-20 (×5): 2 via NASAL
  Filled 2018-01-16: qty 16

## 2018-01-16 MED ORDER — NAPROXEN SODIUM 220 MG PO TABS: 220.0000 mg | ORAL_TABLET | Freq: Two times a day (BID) | ORAL | Status: DC | PRN

## 2018-01-16 MED ORDER — DEXAMETHASONE SODIUM PHOSPHATE 10 MG/ML IJ SOLN
INTRAMUSCULAR | Status: DC | PRN
  Administered 2018-01-16: 10 mg via INTRAVENOUS

## 2018-01-16 MED ORDER — ROCURONIUM BROMIDE 10 MG/ML (PF) SYRINGE
PREFILLED_SYRINGE | INTRAVENOUS | Status: AC
  Filled 2018-01-16: qty 5

## 2018-01-16 MED ORDER — PROPOFOL 10 MG/ML IV BOLUS
INTRAVENOUS | Status: AC
  Filled 2018-01-16: qty 20

## 2018-01-16 MED ORDER — ACETAMINOPHEN 325 MG PO TABS
650.0000 mg | ORAL_TABLET | ORAL | Status: DC | PRN
  Administered 2018-01-17 – 2018-01-19 (×5): 650 mg via ORAL
  Filled 2018-01-16 (×5): qty 2

## 2018-01-16 MED ORDER — ONDANSETRON HCL 4 MG/2ML IJ SOLN
INTRAMUSCULAR | Status: AC
Start: 2018-01-16 — End: 2018-01-16
  Filled 2018-01-16: qty 2

## 2018-01-16 MED ORDER — MENTHOL 3 MG MT LOZG: 1.0000 | LOZENGE | OROMUCOSAL | Status: DC | PRN

## 2018-01-16 MED ORDER — LORATADINE 10 MG PO TABS
10.0000 mg | ORAL_TABLET | Freq: Every day | ORAL | Status: DC
  Administered 2018-01-16 – 2018-01-20 (×5): 10 mg via ORAL
  Filled 2018-01-16 (×6): qty 1

## 2018-01-16 MED ORDER — SUGAMMADEX SODIUM 200 MG/2ML IV SOLN
INTRAVENOUS | Status: DC | PRN
  Administered 2018-01-16: 200 mg via INTRAVENOUS

## 2018-01-16 MED ORDER — SODIUM CHLORIDE 0.9 % IV SOLN
INTRAVENOUS | Status: DC
  Administered 2018-01-16 – 2018-01-17 (×2): via INTRAVENOUS

## 2018-01-16 SURGICAL SUPPLY — 65 items
BANDAGE ESMARK 6X9 LF (GAUZE/BANDAGES/DRESSINGS) ×1 IMPLANT
BIT DRILL 5/64X5 DISP (BIT) ×3 IMPLANT
BLADE SAG 18X100X1.27 (BLADE) ×3 IMPLANT
BLADE SAW SGTL 13X75X1.27 (BLADE) ×3 IMPLANT
BNDG ELASTIC 6X10 VLCR STRL LF (GAUZE/BANDAGES/DRESSINGS) IMPLANT
BNDG ESMARK 6X9 LF (GAUZE/BANDAGES/DRESSINGS) ×3
BNDG GAUZE ELAST 4 BULKY (GAUZE/BANDAGES/DRESSINGS) ×3 IMPLANT
BOWL SMART MIX CTS (DISPOSABLE) ×3 IMPLANT
CAP KNEE TOTAL 3 SIGMA ×3 IMPLANT
CEMENT HV SMART SET (Cement) ×6 IMPLANT
CLOSURE WOUND 1/2 X4 (GAUZE/BANDAGES/DRESSINGS) ×2
COVER SURGICAL LIGHT HANDLE (MISCELLANEOUS) ×3 IMPLANT
CUFF TOURNIQUET SINGLE 34IN LL (TOURNIQUET CUFF) ×3 IMPLANT
CUFF TOURNIQUET SINGLE 44IN (TOURNIQUET CUFF) IMPLANT
DRAPE EXTREMITY T 121X128X90 (DRAPE) ×3 IMPLANT
DRAPE HALF SHEET 40X57 (DRAPES) ×3 IMPLANT
DRAPE U-SHAPE 47X51 STRL (DRAPES) ×3 IMPLANT
DRSG ADAPTIC 3X8 NADH LF (GAUZE/BANDAGES/DRESSINGS) ×3 IMPLANT
DRSG PAD ABDOMINAL 8X10 ST (GAUZE/BANDAGES/DRESSINGS) ×3 IMPLANT
DURAPREP 26ML APPLICATOR (WOUND CARE) ×6 IMPLANT
ELECT CAUTERY BLADE 6.4 (BLADE) ×3 IMPLANT
ELECT REM PT RETURN 9FT ADLT (ELECTROSURGICAL) ×3
ELECTRODE REM PT RTRN 9FT ADLT (ELECTROSURGICAL) ×1 IMPLANT
FACESHIELD WRAPAROUND (MASK) ×3 IMPLANT
FIX PIN ×3 IMPLANT
GAUZE SPONGE 4X4 12PLY STRL (GAUZE/BANDAGES/DRESSINGS) ×3 IMPLANT
GLOVE BIOGEL PI IND STRL 6.5 (GLOVE) ×1 IMPLANT
GLOVE BIOGEL PI IND STRL 7.0 (GLOVE) ×1 IMPLANT
GLOVE BIOGEL PI INDICATOR 6.5 (GLOVE) ×2
GLOVE BIOGEL PI INDICATOR 7.0 (GLOVE) ×2
GLOVE BIOGEL PI ORTHO PRO 7.5 (GLOVE) ×2
GLOVE BIOGEL PI ORTHO PRO SZ8 (GLOVE) ×2
GLOVE ORTHO TXT STRL SZ7.5 (GLOVE) ×3 IMPLANT
GLOVE PI ORTHO PRO STRL 7.5 (GLOVE) ×1 IMPLANT
GLOVE PI ORTHO PRO STRL SZ8 (GLOVE) ×1 IMPLANT
GLOVE SURG ORTHO 8.5 STRL (GLOVE) ×3 IMPLANT
GOWN STRL REUS W/ TWL LRG LVL3 (GOWN DISPOSABLE) ×1 IMPLANT
GOWN STRL REUS W/ TWL XL LVL3 (GOWN DISPOSABLE) ×3 IMPLANT
GOWN STRL REUS W/TWL LRG LVL3 (GOWN DISPOSABLE) ×2
GOWN STRL REUS W/TWL XL LVL3 (GOWN DISPOSABLE) ×6
HANDPIECE INTERPULSE COAX TIP (DISPOSABLE) ×2
IMMOBILIZER KNEE 22 UNIV (SOFTGOODS) ×3 IMPLANT
KIT BASIN OR (CUSTOM PROCEDURE TRAY) ×3 IMPLANT
KIT MANIFOLD (MISCELLANEOUS) ×3 IMPLANT
KIT ROOM TURNOVER OR (KITS) ×3 IMPLANT
MANIFOLD NEPTUNE II (INSTRUMENTS) ×3 IMPLANT
NS IRRIG 1000ML POUR BTL (IV SOLUTION) ×3 IMPLANT
PACK TOTAL JOINT (CUSTOM PROCEDURE TRAY) ×3 IMPLANT
PAD ARMBOARD 7.5X6 YLW CONV (MISCELLANEOUS) ×6 IMPLANT
SET HNDPC FAN SPRY TIP SCT (DISPOSABLE) ×1 IMPLANT
STRIP CLOSURE SKIN 1/2X4 (GAUZE/BANDAGES/DRESSINGS) ×4 IMPLANT
SUCTION FRAZIER HANDLE 10FR (MISCELLANEOUS) ×2
SUCTION TUBE FRAZIER 10FR DISP (MISCELLANEOUS) ×1 IMPLANT
SUT MNCRL AB 3-0 PS2 18 (SUTURE) ×3 IMPLANT
SUT VIC AB 0 CT1 27 (SUTURE) ×2
SUT VIC AB 0 CT1 27XBRD ANBCTR (SUTURE) ×1 IMPLANT
SUT VIC AB 1 CT1 27 (SUTURE) ×6
SUT VIC AB 1 CT1 27XBRD ANBCTR (SUTURE) ×3 IMPLANT
SUT VIC AB 2-0 CT1 27 (SUTURE) ×2
SUT VIC AB 2-0 CT1 TAPERPNT 27 (SUTURE) ×1 IMPLANT
TOWEL OR 17X24 6PK STRL BLUE (TOWEL DISPOSABLE) ×3 IMPLANT
TOWEL OR 17X26 10 PK STRL BLUE (TOWEL DISPOSABLE) ×3 IMPLANT
TRAY CATH 16FR W/PLASTIC CATH (SET/KITS/TRAYS/PACK) IMPLANT
TRAY FOLEY CATH SILVER 16FR (SET/KITS/TRAYS/PACK) ×3 IMPLANT
TRAY FOLEY W/METER SILVER 16FR (SET/KITS/TRAYS/PACK) IMPLANT

## 2018-01-16 NOTE — Anesthesia Postprocedure Evaluation (Signed)
Anesthesia Post Note  Patient: Todd PaceRichard K Coy Jr.  Procedure(s) Performed: RIGHT TOTAL KNEE ARTHROPLASTY (Right Knee)     Patient location during evaluation: PACU Anesthesia Type: General Level of consciousness: awake and alert Pain management: pain level controlled Vital Signs Assessment: post-procedure vital signs reviewed and stable Respiratory status: spontaneous breathing, nonlabored ventilation, respiratory function stable and patient connected to nasal cannula oxygen Cardiovascular status: blood pressure returned to baseline and stable Postop Assessment: no apparent nausea or vomiting Anesthetic complications: no    Last Vitals:  Vitals:   01/16/18 1200 01/16/18 1337  BP: (!) 144/77 (!) 158/83  Pulse: 72 75  Resp: 16   Temp: 36.8 C 37 C  SpO2: 100% 100%    Last Pain:  Vitals:   01/16/18 1337  TempSrc: Oral  PainSc:                  Phillips Groutarignan, Berlie Persky

## 2018-01-16 NOTE — Brief Op Note (Signed)
01/16/2018  9:59 AM  PATIENT:  Todd Paceichard K Stenseth Jr.  67 y.o. male  PRE-OPERATIVE DIAGNOSIS:  right knee osteoarthritis, end stage  POST-OPERATIVE DIAGNOSIS:  right knee osteoarthritis, end stage  PROCEDURE:  Procedure(s): RIGHT TOTAL KNEE ARTHROPLASTY (Right) DePuy Sigma RP  SURGEON:  Surgeon(s) and Role:    Beverely Low* Amiere Cawley, MD - Primary  PHYSICIAN ASSISTANT:   ASSISTANTS: Thea Gisthomas B Dixon, PA-C   ANESTHESIA:   regional and general  EBL:  100 mL   BLOOD ADMINISTERED:none  DRAINS: none   LOCAL MEDICATIONS USED:  NONE  SPECIMEN:  No Specimen  DISPOSITION OF SPECIMEN:  N/A  COUNTS:  YES  TOURNIQUET:   Total Tourniquet Time Documented: Thigh (Right) - 105 minutes Total: Thigh (Right) - 105 minutes   DICTATION: .Other Dictation: Dictation Number [id number]  PLAN OF CARE: Admit to inpatient   PATIENT DISPOSITION:  PACU - hemodynamically stable.   Delay start of Pharmacological VTE agent (>24hrs) due to surgical blood loss or risk of bleeding: no

## 2018-01-16 NOTE — Anesthesia Procedure Notes (Signed)
Procedure Name: Intubation Date/Time: 01/16/2018 7:50 AM Performed by: Marny Lowensteinapozzi, Rosevelt Luu W, CRNA Pre-anesthesia Checklist: Patient identified, Emergency Drugs available, Suction available, Patient being monitored and Timeout performed Patient Re-evaluated:Patient Re-evaluated prior to induction Oxygen Delivery Method: Circle system utilized Preoxygenation: Pre-oxygenation with 100% oxygen Induction Type: IV induction Ventilation: Mask ventilation without difficulty Laryngoscope Size: Miller and 2 Grade View: Grade I Tube type: Oral Tube size: 7.5 mm Number of attempts: 1 Placement Confirmation: ETT inserted through vocal cords under direct vision,  positive ETCO2,  CO2 detector and breath sounds checked- equal and bilateral Secured at: 22 cm Tube secured with: Tape Dental Injury: Teeth and Oropharynx as per pre-operative assessment

## 2018-01-16 NOTE — Interval H&P Note (Signed)
History and Physical Interval Note:  01/16/2018 7:22 AM  Todd Paceichard K Bau Jr.  has presented today for surgery, with the diagnosis of right knee osteoarthritis  The various methods of treatment have been discussed with the patient and family. After consideration of risks, benefits and other options for treatment, the patient has consented to  Procedure(s): RIGHT TOTAL KNEE ARTHROPLASTY (Right) as a surgical intervention .  The patient's history has been reviewed, patient examined, no change in status, stable for surgery.  I have reviewed the patient's chart and labs.  Questions were answered to the patient's satisfaction.     [name],STEVEN R

## 2018-01-16 NOTE — Anesthesia Procedure Notes (Addendum)
Anesthesia Regional Block: Adductor canal block   Pre-Anesthetic Checklist: ,, timeout performed, Correct Patient, Correct Site, Correct Laterality, Correct Procedure, Correct Position, site marked, Risks and benefits discussed,  Surgical consent,  Pre-op evaluation,  At surgeon's request and post-op pain management  Laterality: Right and Lower  Prep: Maximum Sterile Barrier Precautions used, chloraprep       Needles:  Injection technique: Single-shot  Needle Type: Echogenic Stimulator Needle     Needle Length: 10cm      Additional Needles:   Procedures:,,,, ultrasound used (permanent image in chart),,,,  Narrative:  Start time: 01/16/2018 7:00 AM End time: 01/16/2018 7:12 AM Injection made incrementally with aspirations every 5 mL.  Performed by: Personally  Anesthesiologist: Phillips Groutarignan, Kavari Parrillo, MD  Additional Notes: Risks, benefits and alternative to block explained extensively.  Patient tolerated procedure well, without complications.

## 2018-01-16 NOTE — Progress Notes (Signed)
Orthopedic Tech Progress Note Patient Details:  Brooke PaceRichard K Jankowiak Jr. 11/21/1951 [id number]  CPM Right Knee CPM Right Knee: On Right Knee Flexion (Degrees): 60 Right Knee Extension (Degrees): 0 Additional Comments: trapeze bar patient helper  Post Interventions Patient Tolerated: Well Instructions Provided: Care of device  Nikki DomCrawford, Cristan Hout 01/16/2018, 11:05 AM Viewed order from doctor's order list

## 2018-01-16 NOTE — Evaluation (Signed)
Physical Therapy Evaluation Patient Details Name: Todd Barrett. MRN: [id number] DOB: March 14, 1951 Today's Date: 01/16/2018   History of Present Illness  Pt is a 67 y.o. male s/p R TKA on 01/16/18. PMH includes L TKA (2006), multiple back sxs.   Clinical Impression  Pt presents with pain and an overall decrease in functional mobility secondary to above. PTA, pt indep and lives with wife who will be available for 24/7 support. Educ on precautions, positioning, and importance of mobility. Today, pt able to amb 600' with RW and ascend/descend steps with single rail; supervision for safety and cues for gait mechanics. Pt would benefit from continued acute PT services to maximize functional mobility and independence prior to return home.     Follow Up Recommendations Follow surgeon's recommendation for DC plan and follow-up therapies    Equipment Recommendations  None recommended by PT(owns DME)    Recommendations for Other Services OT consult     Precautions / Restrictions Precautions Precautions: Knee Precaution Booklet Issued: Yes (comment) Precaution Comments: Verbally reviewed precautions Restrictions Weight Bearing Restrictions: Yes RLE Weight Bearing: Weight bearing as tolerated      Mobility  Bed Mobility Overal bed mobility: Independent                Transfers Overall transfer level: Needs assistance Equipment used: Rolling walker (2 wheeled) Transfers: Sit to/from Stand Sit to Stand: Supervision            Ambulation/Gait Ambulation/Gait assistance: Supervision Ambulation Distance (Feet): 600 Feet Assistive device: Rolling walker (2 wheeled) Gait Pattern/deviations: Step-through pattern;Decreased stride length;Decreased weight shift to right;Antalgic Gait velocity: Decreased   General Gait Details: Slow, controlled amb with RW and supervision for safety. Cues for heel-to-toe gait pattern and upright posture as pt tends to flex  forward  Stairs Stairs: Yes Stairs assistance: Supervision Stair Management: One rail Right;Step to pattern;Forwards Number of Stairs: 4 General stair comments: Ascend/descend 2 steps with bilat rail support, and again with single rail support. Supervision for safety  Wheelchair Mobility    Modified Rankin (Stroke Patients Only)       Balance Overall balance assessment: Needs assistance Sitting-balance support: No upper extremity supported Sitting balance-Leahy Scale: Good       Standing balance-Leahy Scale: Fair Standing balance comment: Can static stand with no UE support                             Pertinent Vitals/Pain Pain Assessment: Faces Faces Pain Scale: Hurts a little bit Pain Location: R knee Pain Descriptors / Indicators: Sore;Operative site guarding Pain Intervention(s): Monitored during session    Home Living Family/patient expects to be discharged to:: Private residence Living Arrangements: Spouse/significant other Available Help at Discharge: Family;Available 24 hours/day Type of Home: House Home Access: Stairs to enter Entrance Stairs-Rails: None Entrance Stairs-Number of Steps: 2 Home Layout: Two level;Able to live on main level with bedroom/bathroom Home Equipment: Dan Humphreys - 2 wheels      Prior Function Level of Independence: Independent               Hand Dominance        Extremity/Trunk Assessment   Upper Extremity Assessment Upper Extremity Assessment: Overall WFL for tasks assessed    Lower Extremity Assessment Lower Extremity Assessment: RLE deficits/detail RLE Deficits / Details: s/p R TKA; hip flex 3/5, knee flex/ext 3/5 RLE: Unable to fully assess due to pain       Communication  Communication: No difficulties  Cognition Arousal/Alertness: Awake/alert Behavior During Therapy: WFL for tasks assessed/performed Overall Cognitive Status: Within Functional Limits for tasks assessed                                         General Comments      Exercises Total Joint Exercises Long Arc Quad: AROM;Right;10 reps;Seated Knee Flexion: AROM;Right;10 reps;Seated   Assessment/Plan    PT Assessment Patient needs continued PT services  PT Problem List Decreased strength;Decreased range of motion;Decreased activity tolerance;Decreased balance;Decreased mobility;Decreased knowledge of use of DME;Pain       PT Treatment Interventions DME instruction;Gait training;Stair training;Functional mobility training;Therapeutic activities;Therapeutic exercise;Balance training;Patient/family education    PT Goals (Current goals can be found in the Care Plan section)  Acute Rehab PT Goals Patient Stated Goal: Return home tomorrow PT Goal Formulation: With patient Time For Goal Achievement: 01/30/18 Potential to Achieve Goals: Good    Frequency 7X/week   Barriers to discharge        Co-evaluation               AM-PAC PT "6 Clicks" Daily Activity  Outcome Measure Difficulty turning over in bed (including adjusting bedclothes, sheets and blankets)?: None Difficulty moving from lying on back to sitting on the side of the bed? : None Difficulty sitting down on and standing up from a chair with arms (e.g., wheelchair, bedside commode, etc,.)?: A Little Help needed moving to and from a bed to chair (including a wheelchair)?: A Little Help needed walking in hospital room?: A Little Help needed climbing 3-5 steps with a railing? : A Little 6 Click Score: 20    End of Session Equipment Utilized During Treatment: Gait belt Activity Tolerance: Patient tolerated treatment well Patient left: in chair;with call bell/phone within reach Nurse Communication: Mobility status PT Visit Diagnosis: Other abnormalities of gait and mobility (R26.89);Pain Pain - Right/Left: Right Pain - part of body: Knee    Time: 1511-1535 PT Time Calculation (min) (ACUTE ONLY): 24 min   Charges:   PT  Evaluation $PT Eval Moderate Complexity: 1 Mod PT Treatments $Gait Training: 8-22 mins   PT G Codes:       Ina HomesJaclyn Khamani Fairley, PT, DPT Acute Rehab Services  Pager: [phone]  Malachy ChamberJaclyn L Sparrow [place] 01/16/2018, 3:47 PM

## 2018-01-16 NOTE — Anesthesia Preprocedure Evaluation (Signed)
Anesthesia Evaluation  Patient identified by MRN, date of birth, ID band Patient awake    Reviewed: Allergy & Precautions, NPO status , Patient's Chart, lab work & pertinent test results  Airway Mallampati: II  TM Distance: >3 FB Neck ROM: Full    Dental no notable dental hx.    Pulmonary neg pulmonary ROS,    Pulmonary exam normal breath sounds clear to auscultation       Cardiovascular negative cardio ROS Normal cardiovascular exam Rhythm:Regular Rate:Normal     Neuro/Psych negative neurological ROS  negative psych ROS   GI/Hepatic negative GI ROS, Neg liver ROS,   Endo/Other  negative endocrine ROS  Renal/GU negative Renal ROS  negative genitourinary   Musculoskeletal negative musculoskeletal ROS (+)   Abdominal   Peds negative pediatric ROS (+)  Hematology negative hematology ROS (+)   Anesthesia Other Findings   Reproductive/Obstetrics negative OB ROS                             Anesthesia Physical Anesthesia Plan  ASA: II  Anesthesia Plan: General   Post-op Pain Management:  Regional for Post-op pain   Induction:   PONV Risk Score and Plan: 2 and Ondansetron and Midazolam  Airway Management Planned: LMA and Oral ETT  Additional Equipment:   Intra-op Plan:   Post-operative Plan: Extubation in OR  Informed Consent: I have reviewed the patients History and Physical, chart, labs and discussed the procedure including the risks, benefits and alternatives for the proposed anesthesia with the patient or authorized representative who has indicated his/her understanding and acceptance.   Dental advisory given  Plan Discussed with:   Anesthesia Plan Comments:         Anesthesia Quick Evaluation

## 2018-01-16 NOTE — Transfer of Care (Signed)
Immediate Anesthesia Transfer of Care Note  Patient: Brooke PaceRichard K Dockstader Jr.  Procedure(s) Performed: RIGHT TOTAL KNEE ARTHROPLASTY (Right Knee)  Patient Location: PACU  Anesthesia Type:General  Level of Consciousness: awake, alert  and oriented  Airway & Oxygen Therapy: Patient Spontanous Breathing and Patient connected to face mask oxygen  Post-op Assessment: Report given to RN and Post -op Vital signs reviewed and stable  Post vital signs: Reviewed and stable  Last Vitals:  Vitals:   01/16/18 0612  BP: (!) 148/92  Pulse: 66  Resp: 17  Temp: 36.9 C  SpO2: 98%    Last Pain:  Vitals:   01/16/18 0612  TempSrc: Oral      Patients Stated Pain Goal: 3 (01/16/18 0612)  Complications: No apparent anesthesia complications

## 2018-01-17 ENCOUNTER — Encounter (HOSPITAL_COMMUNITY): Payer: Self-pay

## 2018-01-17 LAB — CBC
HEMATOCRIT: 39.8 % (ref 39.0–52.0)
Hemoglobin: 13.7 g/dL (ref 13.0–17.0)
MCH: 30.6 pg (ref 26.0–34.0)
MCHC: 34.4 g/dL (ref 30.0–36.0)
MCV: 88.8 fL (ref 78.0–100.0)
Platelets: 166 10*3/uL (ref 150–400)
RBC: 4.48 MIL/uL (ref 4.22–5.81)
RDW: 12.6 % (ref 11.5–15.5)
WBC: 10 10*3/uL (ref 4.0–10.5)

## 2018-01-17 LAB — BASIC METABOLIC PANEL
ANION GAP: 9 (ref 5–15)
BUN: 10 mg/dL (ref 6–20)
CALCIUM: 8.7 mg/dL — AB (ref 8.9–10.3)
CO2: 27 mmol/L (ref 22–32)
CREATININE: 1 mg/dL (ref 0.61–1.24)
Chloride: 100 mmol/L — ABNORMAL LOW (ref 101–111)
GFR calc non Af Amer: >60 mL/min (ref >60)
Glucose, Bld: 124 mg/dL — ABNORMAL HIGH (ref 65–99)
Potassium: 4.1 mmol/L (ref 3.5–5.1)
SODIUM: 136 mmol/L (ref 135–145)

## 2018-01-17 NOTE — Evaluation (Signed)
Occupational Therapy Evaluation Patient Details Name: Todd PaceRichard K Laidler Jr. MRN: [id number] DOB: 11/27/1950 Today's Date: 01/17/2018    History of Present Illness Pt is a 67 y.o. male s/p R TKA on 01/16/18. PMH includes L TKA (2006), multiple back sxs.    Clinical Impression   PTA, pt was independent with ADL and functional mobility. He currently requires overall max assist for LB ADL and was only able to tolerate seated ADL at EOB this session secondary to pain despite encouragement to progress OOB. Pt would benefit from continued OT services while admitted to improve independence with ADL and functional mobility prior to returning home with his wife. Anticipate once pain is under control, he will not need OT follow-up post-acute D/C. Initiated education concerning knee precautions related to ADL as well as compensatory ADL strategies. Will continue to follow while admitted.     Follow Up Recommendations  No OT follow up;Supervision/Assistance - 24 hour    Equipment Recommendations  3 in 1 bedside commode    Recommendations for Other Services       Precautions / Restrictions Precautions Precautions: Knee Precaution Comments: Reviewed no pillow under knee and knee precautions related to ADL.  Restrictions Weight Bearing Restrictions: Yes RLE Weight Bearing: Weight bearing as tolerated      Mobility Bed Mobility Overal bed mobility: Needs Assistance Bed Mobility: Supine to Sit;Sit to Supine     Supine to sit: Supervision Sit to supine: Supervision   General bed mobility comments: Pt moves very quickly when in pain making him unsafe.  Transfers Overall transfer level: Needs assistance Equipment used: Rolling walker (2 wheeled) Transfers: Sit to/from Stand Sit to Stand: Min assist         General transfer comment: Declined standing secondary to pain.    Balance Overall balance assessment: Needs assistance Sitting-balance support: No upper extremity  supported Sitting balance-Leahy Scale: Good                                     ADL either performed or assessed with clinical judgement   ADL Overall ADL's : Needs assistance/impaired Eating/Feeding: Set up;Sitting   Grooming: Set up;Sitting   Upper Body Bathing: Supervision/ safety;Sitting   Lower Body Bathing: Maximal assistance;Sit to/from stand   Upper Body Dressing : Supervision/safety;Sitting   Lower Body Dressing: Maximal assistance;Sit to/from stand                 General ADL Comments: Pt significantly limited by pain this session and only able to tolerate ADL seated at EOB for 3-4 minutes. At which time, requested to lay back down secondary to pain.      Vision Patient Visual Report: No change from baseline Vision Assessment?: No apparent visual deficits     Perception     Praxis      Pertinent Vitals/Pain Pain Assessment: Faces Pain Score: 8  Faces Pain Scale: Hurts whole lot Pain Location: R knee Pain Descriptors / Indicators: Aching;Burning;Grimacing;Guarding;Pounding;Shooting;Sharp;Throbbing Pain Intervention(s): Monitored during session;Limited activity within patient's tolerance;Repositioned;Ice applied     Hand Dominance     Extremity/Trunk Assessment Upper Extremity Assessment Upper Extremity Assessment: Overall WFL for tasks assessed   Lower Extremity Assessment Lower Extremity Assessment: RLE deficits/detail RLE Deficits / Details: Decreased strength and ROM as expected post-operatively.        Communication Communication Communication: No difficulties   Cognition Arousal/Alertness: Awake/alert Behavior During Therapy: WFL for tasks assessed/performed  Overall Cognitive Status: Within Functional Limits for tasks assessed                                     General Comments  Pt reports some relief from pain since beginning pain medication schedule but when moving it increased again.     Exercises      Shoulder Instructions      Home Living Family/patient expects to be discharged to:: Private residence Living Arrangements: Spouse/significant other Available Help at Discharge: Family;Available 24 hours/day Type of Home: House Home Access: Stairs to enter [employer] of Steps: 2 Entrance Stairs-Rails: None Home Layout: Two level;Able to live on main level with bedroom/bathroom     Bathroom Shower/Tub: Walk-in shower   Bathroom Toilet: Handicapped height     Home Equipment: Environmental consultant - 2 wheels          Prior Functioning/Environment Level of Independence: Independent                 OT Problem List: Decreased strength;Decreased range of motion;Decreased activity tolerance;Impaired balance (sitting and/or standing);Decreased safety awareness;Decreased knowledge of use of DME or AE;Decreased knowledge of precautions;Pain      OT Treatment/Interventions: Self-care/ADL training;Therapeutic exercise;Energy conservation;DME and/or AE instruction;Therapeutic activities;Balance training;Patient/family education    OT Goals(Current goals can be found in the care plan section) Acute Rehab OT Goals Patient Stated Goal: Return home tomorrow OT Goal Formulation: With patient/family Time For Goal Achievement: 01/31/18 Potential to Achieve Goals: Good ADL Goals Pt Will Perform Grooming: with modified independence;standing Pt Will Perform Lower Body Dressing: with modified independence;sit to/from stand Pt Will Transfer to Toilet: with modified independence;ambulating;bedside commode Pt Will Perform Toileting - Clothing Manipulation and hygiene: with modified independence;sit to/from stand Pt Will Perform Tub/Shower Transfer: Shower transfer;with modified independence;rolling walker;3 in 1;ambulating  OT Frequency: Min 2X/week   Barriers to D/C:            Co-evaluation              AM-PAC PT "6 Clicks" Daily Activity     Outcome Measure Help from another  person eating meals?: None Help from another person taking care of personal grooming?: A Little Help from another person toileting, which includes using toliet, bedpan, or urinal?: A Lot Help from another person bathing (including washing, rinsing, drying)?: A Little Help from another person to put on and taking off regular upper body clothing?: A Little Help from another person to put on and taking off regular lower body clothing?: A Little 6 Click Score: 18   End of Session Equipment Utilized During Treatment: Right knee immobilizer CPM Right Knee CPM Right Knee: Off Additional Comments: Seated in recliner with knee ext in bone foam  Activity Tolerance: Patient tolerated treatment well Patient left: in bed;with call bell/phone within reach;with family/visitor present  OT Visit Diagnosis: Other abnormalities of gait and mobility (R26.89);Pain Pain - Right/Left: Right Pain - part of body: Knee                Time: 1135-1153 OT Time Calculation (min): 18 min Charges:  OT General Charges $OT Visit: 1 Visit OT Evaluation $OT Eval Moderate Complexity: 1 Mod G-Codes:     Doristine Section, MS OTR/L  Pager: [phone]   Erdem Naas A Monaye Blackie 01/17/2018, 2:02 PM

## 2018-01-17 NOTE — Progress Notes (Signed)
Subjective: 1 Day Post-Op Procedure(s) (LRB): RIGHT TOTAL KNEE ARTHROPLASTY (Right) Patient reports pain as 5 on 0-10 scale. Moves foot well and circulation is intact. Main issue is his pain.   Objective: Vital signs in last 24 hours: Temp:  [97 F (36.1 C)-98.6 F (37 C)] 97.8 F (36.6 C) (02/23 0818) Pulse Rate:  [62-85] 73 (02/23 0818) Resp:  [11-20] 16 (02/22 1200) BP: (137-167)/(74-102) 138/83 (02/23 0818) SpO2:  [96 %-100 %] 100 % (02/23 0818) Weight:  [104.3 kg (229 lb 15 oz)] 104.3 kg (229 lb 15 oz) (02/22 1200)  Intake/Output from previous day: 02/22 0701 - 02/23 0700 In: 1750 [I.V.:1750] Out: 2625 [Urine:2525; Blood:100] Intake/Output this shift: No intake/output data recorded.  Recent Labs    01/17/18 0517  HGB 13.7   Recent Labs    01/17/18 0517  WBC 10.0  RBC 4.48  HCT 39.8  PLT 166   Recent Labs    01/17/18 0517  NA 136  K 4.1  CL 100*  CO2 27  BUN 10  CREATININE 1.00  GLUCOSE 124*  CALCIUM 8.7*   No results for input(s): LABPT, INR in the last 72 hours.  Neurologically intact  Assessment/Plan: 1 Day Post-Op Procedure(s) (LRB): RIGHT TOTAL KNEE ARTHROPLASTY (Right) Up with therapy  Todd Barrett 01/17/2018, 9:03 AM

## 2018-01-17 NOTE — Progress Notes (Signed)
Physical Therapy Treatment Patient Details Name: Todd PaceRichard K Bachand Jr. MRN: [id number] DOB: 01/16/1951 Today's Date: 01/17/2018    History of Present Illness Pt is a 67 y.o. male s/p R TKA on 01/16/18. PMH includes L TKA (2006), multiple back sxs.     PT Comments    Pt complains of increased knee pain this morning.  Unable to progress with mobility at this time.  RN states they are going to put pt on a pain medication schedule to get pain back under control.      Follow Up Recommendations  Follow surgeon's recommendation for DC plan and follow-up therapies     Equipment Recommendations  None recommended by PT    Recommendations for Other Services OT consult     Precautions / Restrictions Precautions Precautions: Knee Precaution Comments: reviewed knee precautions - no pillow under knee and do not elevate foot of bed to bend knees.  Restrictions Weight Bearing Restrictions: Yes RLE Weight Bearing: Weight bearing as tolerated    Mobility  Bed Mobility Overal bed mobility: Independent                Transfers Overall transfer level: Needs assistance Equipment used: Rolling walker (2 wheeled) Transfers: Sit to/from Stand Sit to Stand: Min assist         General transfer comment: cues for hand placement.  (A) to stabilize with initial standing.    Ambulation/Gait Ambulation/Gait assistance: Min guard Ambulation Distance (Feet): 60 Feet Assistive device: Rolling walker (2 wheeled) Gait Pattern/deviations: Step-through pattern;Antalgic;Trunk flexed     General Gait Details: increased guarding today due to pain.  Antalgic gt and flexed posture.  Cues to slow down to ensure safety as he was trying to move quickly due to pain   Stairs            Wheelchair Mobility    Modified Rankin (Stroke Patients Only)       Balance                                            Cognition Arousal/Alertness: Awake/alert Behavior During Therapy:  WFL for tasks assessed/performed Overall Cognitive Status: Within Functional Limits for tasks assessed                                        Exercises      General Comments General comments (skin integrity, edema, etc.): patient states he was hurting bad at time of shift change this morning and was supposed to have pain medication but did not receive it until 45 mins later.  patient complains of 8/10 pain.        Pertinent Vitals/Pain Pain Assessment: 0-10 Pain Score: 8  Pain Location: R knee Pain Descriptors / Indicators: Aching;Burning;Grimacing;Guarding;Pounding;Shooting;Sharp;Throbbing Pain Intervention(s): Limited activity within patient's tolerance;Monitored during session;Premedicated before session;Repositioned;Ice applied    Home Living                      Prior Function            PT Goals (current goals can now be found in the care plan section) Acute Rehab PT Goals Patient Stated Goal: Return home tomorrow PT Goal Formulation: With patient Time For Goal Achievement: 01/30/18 Potential to Achieve Goals: Good Progress towards PT goals:  Progressing toward goals    Frequency    7X/week      PT Plan Current plan remains appropriate    Co-evaluation              AM-PAC PT "6 Clicks" Daily Activity  Outcome Measure                   End of Session Equipment Utilized During Treatment: Gait belt Activity Tolerance: Patient limited by pain Patient left: in chair;with call bell/phone within reach;with nursing/sitter in room Nurse Communication: Mobility status PT Visit Diagnosis: Other abnormalities of gait and mobility (R26.89);Pain Pain - Right/Left: Right Pain - part of body: Knee     Time: 1610-9604 PT Time Calculation (min) (ACUTE ONLY): 16 min  Charges:  $Gait Training: 8-22 mins                    G Codes:       Todd Barrett, PTA  01/17/2018    Todd Barrett 01/17/2018, 11:00 AM

## 2018-01-17 NOTE — Care Management Note (Signed)
67 yo M s/p R TKA. Received referral to assist with [facility] and DME. Met with pt. He plans to return home with the support of his wife. He has a RW and a 3-in-1 BSC. Referral made to [facility] prior to surgery. Pt stated that he is going to outpt rehab and he starts rehab on 2/27. Pt agrees to use [facility] if [facility] needed. Discussed outpt rehab vs [place] with RN. Will continue to f/u to assist with the D/C plan.

## 2018-01-18 LAB — CBC
HEMATOCRIT: 37.3 % — AB (ref 39.0–52.0)
Hemoglobin: 12.7 g/dL — ABNORMAL LOW (ref 13.0–17.0)
MCH: 30.3 pg (ref 26.0–34.0)
MCHC: 34 g/dL (ref 30.0–36.0)
MCV: 89 fL (ref 78.0–100.0)
PLATELETS: 148 10*3/uL — AB (ref 150–400)
RBC: 4.19 MIL/uL — ABNORMAL LOW (ref 4.22–5.81)
RDW: 12.5 % (ref 11.5–15.5)
WBC: 8.5 10*3/uL (ref 4.0–10.5)

## 2018-01-18 MED ORDER — KETOROLAC TROMETHAMINE 15 MG/ML IJ SOLN
15.0000 mg | Freq: Four times a day (QID) | INTRAMUSCULAR | Status: DC | PRN
  Administered 2018-01-18 – 2018-01-20 (×2): 15 mg via INTRAVENOUS
  Filled 2018-01-18 (×2): qty 1

## 2018-01-18 NOTE — Progress Notes (Signed)
Notified from PT that patient was tachycardic at rest. Upon assessment patient had complaints of pain and heart was sustaining in 120s. Patient denied chest pain or difficulty breathing. Pain medication was administered. MD was notified. New orders were placed. Patient comfortable in bed, will continue to monitor patient status.

## 2018-01-18 NOTE — Progress Notes (Signed)
Per MD, pt is going to outpt rehab [facility]

## 2018-01-18 NOTE — Progress Notes (Signed)
Physical Therapy Treatment Patient Details Name: Todd PaceRichard K Giebel Jr. MRN: [id number] DOB: 09/20/1951 Today's Date: 01/18/2018    History of Present Illness Pt is a 67 y.o. male s/p R TKA on 01/16/18. PMH includes L TKA (2006), multiple back sxs.     PT Comments    AM session today focusing on improving tolerance and endurance with gait training. Patient aware of heel toe gait pattern throughout gait with no LOB. Patient slightly impulsive during transfers requiring VC to slow movements to reduce fall risk. Discussion of HHPT vs OPPT with patient wishing to begin OPPT as soon as he can. Will plan to review HEP at next visit and continue to promote functional mobility.     Follow Up Recommendations  Follow surgeon's recommendation for DC plan and follow-up therapies     Equipment Recommendations  None recommended by PT    Recommendations for Other Services       Precautions / Restrictions Precautions Precautions: Knee Precaution Comments: Reviewed no pillow under knee and knee precautions related to ADL.  Restrictions Weight Bearing Restrictions: Yes RLE Weight Bearing: Weight bearing as tolerated    Mobility  Bed Mobility               General bed mobility comments: In recliner upon PT arrival.  Transfers Overall transfer level: Needs assistance Equipment used: Rolling walker (2 wheeled) Transfers: Sit to/from Stand Sit to Stand: Min guard         General transfer comment: Heavy VC to slow movements as patient is impulsive increasing his fall risk  Ambulation/Gait Ambulation/Gait assistance: Min guard Ambulation Distance (Feet): 120 Feet Assistive device: Rolling walker (2 wheeled) Gait Pattern/deviations: Step-through pattern;Decreased step length - left;Decreased stance time - right;Decreased weight shift to right;Antalgic Gait velocity: Decreased   General Gait Details: patient congnizant of heel toe gait pattern - some increased pain with heel strike,  however not limiting. VC for forward gaze and upright posturing   Stairs            Wheelchair Mobility    Modified Rankin (Stroke Patients Only)       Balance Overall balance assessment: Needs assistance Sitting-balance support: No upper extremity supported;Feet supported Sitting balance-Leahy Scale: Good     Standing balance support: Bilateral upper extremity supported;During functional activity Standing balance-Leahy Scale: Fair                              Cognition Arousal/Alertness: Awake/alert Behavior During Therapy: WFL for tasks assessed/performed Overall Cognitive Status: Within Functional Limits for tasks assessed                                        Exercises      General Comments        Pertinent Vitals/Pain Pain Assessment: 0-10 Pain Score: 6  Pain Location: R knee Pain Descriptors / Indicators: Grimacing;Guarding;Moaning;Operative site guarding Pain Intervention(s): Limited activity within patient's tolerance;Monitored during session;Repositioned    Home Living                      Prior Function            PT Goals (current goals can now be found in the care plan section) Acute Rehab PT Goals Patient Stated Goal: Return home tomorrow PT Goal Formulation: With patient Time For Goal  Achievement: 01/30/18 Potential to Achieve Goals: Good    Frequency    7X/week      PT Plan      Co-evaluation              AM-PAC PT "6 Clicks" Daily Activity  Outcome Measure  Difficulty turning over in bed (including adjusting bedclothes, sheets and blankets)?: None Difficulty moving from lying on back to sitting on the side of the bed? : None Difficulty sitting down on and standing up from a chair with arms (e.g., wheelchair, bedside commode, etc,.)?: Unable Help needed moving to and from a bed to chair (including a wheelchair)?: A Little Help needed walking in hospital room?: A Little Help  needed climbing 3-5 steps with a railing? : A Little 6 Click Score: 18    End of Session Equipment Utilized During Treatment: Gait belt Activity Tolerance: Patient tolerated treatment well Patient left: in chair;with call bell/phone within reach;with family/visitor present Nurse Communication: Mobility status PT Visit Diagnosis: Other abnormalities of gait and mobility (R26.89);Pain Pain - Right/Left: Right Pain - part of body: Knee     Time: 0981-1914 PT Time Calculation (min) (ACUTE ONLY): 21 min  Charges:  $Gait Training: 8-22 mins                    G Codes:        Kipp Laurence, PT, DPT 01/18/18 10:36 AM

## 2018-01-18 NOTE — Progress Notes (Signed)
Physical Therapy Treatment Patient Details Name: Todd Barrett. MRN: [id number] DOB: [birth year] Today's Date: 01/18/2018    History of Present Illness Pt is a 67 y.o. male s/p R TKA on 01/16/18. PMH includes L TKA (2006), multiple back sxs.     PT Comments    Patient seated EOB upon PT arrival - reports he was going to attempt to go to recliner independently. Education to transfer only with assistance. Noted HR while seated ~130 bpm, increasing to ~150 bpm during LAQ and seated heel slides. PT terminating further there ex due to high HR. Transfer to recliner with Min A for safety and stability. Once in recliner with LE extended HR ranging from ~115-130 bpm. PT made nurse aware. Will continue to follow acutely as tolerated.     Follow Up Recommendations  Follow surgeon's recommendation for DC plan and follow-up therapies     Equipment Recommendations  None recommended by PT    Recommendations for Other Services       Precautions / Restrictions Precautions Precautions: Knee Precaution Comments: Reviewed no pillow under knee and knee precautions related to ADL.  Restrictions Weight Bearing Restrictions: Yes RLE Weight Bearing: Weight bearing as tolerated    Mobility  Bed Mobility               General bed mobility comments: seated EOB upon PT arrival  Transfers Overall transfer level: Needs assistance Equipment used: Rolling walker (2 wheeled) Transfers: Sit to/from Stand Sit to Stand: Min assist         General transfer comment: Min to come to full standing position. Pain likely increasing need for assistance  Ambulation/Gait Ambulation/Gait assistance: Min guard Ambulation Distance (Feet): (few steps from bed to chair) Assistive device: Rolling walker (2 wheeled) Gait Pattern/deviations: Step-to pattern;Decreased stride length;Decreased weight shift to right;Antalgic     General Gait Details: groaning throughout few steps due to pain   Stairs             Wheelchair Mobility    Modified Rankin (Stroke Patients Only)       Balance Overall balance assessment: Needs assistance Sitting-balance support: No upper extremity supported;Feet supported Sitting balance-Leahy Scale: Good     Standing balance support: Bilateral upper extremity supported;During functional activity Standing balance-Leahy Scale: Fair                              Cognition Arousal/Alertness: Awake/alert Behavior During Therapy: WFL for tasks assessed/performed Overall Cognitive Status: Within Functional Limits for tasks assessed                                        Exercises Total Joint Exercises Heel Slides: Right;5 reps;Seated(patient tendency to hold breath) Long Arc Quad: AROM;Right;10 reps;Seated(patient tendency to hold breath)    General Comments        Pertinent Vitals/Pain Pain Assessment: 0-10 Pain Score: 7  Pain Location: R knee Pain Descriptors / Indicators: Grimacing;Guarding;Moaning Pain Intervention(s): Limited activity within patient's tolerance;Monitored during session;Repositioned    Home Living                      Prior Function            PT Goals (current goals can now be found in the care plan section) Acute Rehab PT Goals Patient Stated Goal: Return home  tomorrow PT Goal Formulation: With patient Time For Goal Achievement: 01/30/18 Potential to Achieve Goals: Good Progress towards PT goals: Progressing toward goals    Frequency    7X/week      PT Plan Current plan remains appropriate    Co-evaluation              AM-PAC PT "6 Clicks" Daily Activity  Outcome Measure  Difficulty turning over in bed (including adjusting bedclothes, sheets and blankets)?: None Difficulty moving from lying on back to sitting on the side of the bed? : None Difficulty sitting down on and standing up from a chair with arms (e.g., wheelchair, bedside commode, etc,.)?:  Unable Help needed moving to and from a bed to chair (including a wheelchair)?: A Lot Help needed walking in hospital room?: A Little Help needed climbing 3-5 steps with a railing? : A Lot 6 Click Score: 16    End of Session Equipment Utilized During Treatment: Gait belt Activity Tolerance: Patient limited by pain Patient left: in chair;with call bell/phone within reach Nurse Communication: Mobility status PT Visit Diagnosis: Other abnormalities of gait and mobility (R26.89);Pain Pain - Right/Left: Right Pain - part of body: Knee     Time: [id number] PT Time Calculation (min) (ACUTE ONLY): 12 min  Charges:  $Therapeutic Activity: 8-22 mins                    G Codes:       Kipp LaurenceStephanie R Calley Drenning, PT, DPT 01/18/18 2:20 PM

## 2018-01-18 NOTE — Progress Notes (Signed)
Orthopedics Progress Note  Subjective: Increased pain since block wore off. He is a little better from a pain standpoint today.  Objective:  Vitals:   01/17/18 2039 01/18/18 0429  BP: (!) 159/79 131/68  Pulse: 79 77  Resp:  18  Temp: 99 F (37.2 C) 99.1 F (37.3 C)  SpO2: 98% 97%    General: Awake and alert  Musculoskeletal: right knee dressing changed, incision benign, Neg Homan's Neurovascularly intact  Lab Results  Component Value Date   WBC 8.5 01/18/2018   HGB 12.7 (L) 01/18/2018   HCT 37.3 (L) 01/18/2018   MCV 89.0 01/18/2018   PLT 148 (L) 01/18/2018       Component Value Date/Time   NA 136 01/17/2018 0517   K 4.1 01/17/2018 0517   CL 100 (L) 01/17/2018 0517   CO2 27 01/17/2018 0517   GLUCOSE 124 (H) 01/17/2018 0517   GLUCOSE 104 (H) 10/23/2006 0848   BUN 10 01/17/2018 0517   CREATININE 1.00 01/17/2018 0517   CALCIUM 8.7 (L) 01/17/2018 0517   GFRNONAA >60 01/17/2018 0517   GFRAA >60 01/17/2018 0517    Lab Results  Component Value Date   INR 1.03 07/23/2012    Assessment/Plan: POD #2 s/p Procedure(s): RIGHT TOTAL KNEE ARTHROPLASTY Stable this AM.  PT, OT Discharge planned for tomorrow, Will be going directly to outpatient therapy for his rehab DVT prophylaxis with ASA and TED/SCDs  Almedia BallsSteven R. Ranell PatrickNorris, MD 01/18/2018 8:09 AM

## 2018-01-18 NOTE — Progress Notes (Signed)
OT Cancellation Note  Patient Details Name: Todd PaceRichard K Agostini Jr. MRN: [id number] DOB: 06/25/1951   Cancelled Treatment:    Reason Eval/Treat Not Completed: Pain limiting ability to participate;Medical issues which prohibited therapy. Pt with elevated HR 120s supine at rest and up to 150 with PT session this pm. Pt has just returned to bed with significant pain. Will check back next date to progress with POC.   Doristine Sectionharity A Jurni Cesaro, MS OTR/L  Pager: [phone]   Doristine SectionCharity A Dailah Opperman 01/18/2018, 3:09 PM

## 2018-01-19 ENCOUNTER — Other Ambulatory Visit: Payer: Self-pay | Admitting: Physician Assistant

## 2018-01-19 ENCOUNTER — Encounter (HOSPITAL_COMMUNITY): Payer: Self-pay | Admitting: Physician Assistant

## 2018-01-19 DIAGNOSIS — I4891 Unspecified atrial fibrillation: Secondary | ICD-10-CM

## 2018-01-19 DIAGNOSIS — Z96651 Presence of right artificial knee joint: Secondary | ICD-10-CM

## 2018-01-19 DIAGNOSIS — I480 Paroxysmal atrial fibrillation: Secondary | ICD-10-CM

## 2018-01-19 HISTORY — DX: Unspecified atrial fibrillation: I48.91

## 2018-01-19 LAB — CBC
HEMATOCRIT: 36.2 % — AB (ref 39.0–52.0)
Hemoglobin: 12.5 g/dL — ABNORMAL LOW (ref 13.0–17.0)
MCH: 30.7 pg (ref 26.0–34.0)
MCHC: 34.5 g/dL (ref 30.0–36.0)
MCV: 88.9 fL (ref 78.0–100.0)
Platelets: 150 10*3/uL (ref 150–400)
RBC: 4.07 MIL/uL — ABNORMAL LOW (ref 4.22–5.81)
RDW: 12.4 % (ref 11.5–15.5)
WBC: 7.8 10*3/uL (ref 4.0–10.5)

## 2018-01-19 MED ORDER — SODIUM CHLORIDE 0.9 % IV SOLN: 250.0000 mL | INTRAVENOUS | Status: DC | PRN

## 2018-01-19 MED ORDER — DILTIAZEM HCL ER COATED BEADS 120 MG PO CP24
120.0000 mg | ORAL_CAPSULE | Freq: Every day | ORAL | Status: DC
  Administered 2018-01-19 – 2018-01-20 (×2): 120 mg via ORAL
  Filled 2018-01-19 (×2): qty 1

## 2018-01-19 MED ORDER — SODIUM CHLORIDE 0.9% FLUSH
3.0000 mL | Freq: Two times a day (BID) | INTRAVENOUS | Status: DC
  Administered 2018-01-19 – 2018-01-20 (×2): 3 mL via INTRAVENOUS

## 2018-01-19 MED ORDER — SODIUM CHLORIDE 0.9% FLUSH: 3.0000 mL | INTRAVENOUS | Status: DC | PRN

## 2018-01-19 MED ORDER — RIVAROXABAN 20 MG PO TABS
20.0000 mg | ORAL_TABLET | Freq: Every day | ORAL | Status: DC
  Administered 2018-01-19: 20 mg via ORAL
  Filled 2018-01-19: qty 1

## 2018-01-19 NOTE — Progress Notes (Signed)
Physical Therapy Treatment Patient Details Name: Todd Barrett. MRN: [id number] DOB: [birth year] Today's Date: 01/19/2018    History of Present Illness Pt is a 67 y.o. male s/p R TKA on 01/16/18. PMH includes L TKA (2006), multiple back sxs.     PT Comments    This session focused on LE therex and HEP reviewed. OOB mobility deferred due to A fib per RN. Pt now on telemetry. Continue to progress as tolerated.    Follow Up Recommendations  Follow surgeon's recommendation for DC plan and follow-up therapies     Equipment Recommendations  None recommended by PT    Recommendations for Other Services OT consult     Precautions / Restrictions Precautions Precautions: Knee Precaution Comments: knee precautions reviewed with pt Restrictions Weight Bearing Restrictions: Yes RLE Weight Bearing: Weight bearing as tolerated    Mobility  Bed Mobility               General bed mobility comments: OOB mobility deferred due to A fib  Transfers                    Ambulation/Gait                 Stairs            Wheelchair Mobility    Modified Rankin (Stroke Patients Only)       Balance                                            Cognition Arousal/Alertness: Awake/alert Behavior During Therapy: WFL for tasks assessed/performed Overall Cognitive Status: Within Functional Limits for tasks assessed                                        Exercises Total Joint Exercises Ankle Circles/Pumps: AROM;Both;20 reps Quad Sets: AROM;Both;10 reps Short Arc Quad: AROM;Right;AAROM;10 reps Heel Slides: Right;AAROM;10 reps Hip ABduction/ADduction: AROM;Right;10 reps    General Comments        Pertinent Vitals/Pain Pain Assessment: Faces Pain Score: 6  Faces Pain Scale: Hurts even more Pain Location: R knee with therex Pain Descriptors / Indicators: Sore;Grimacing Pain Intervention(s): Limited activity within  patient's tolerance;Monitored during session;Repositioned    Home Living                      Prior Function            PT Goals (current goals can now be found in the care plan section) Acute Rehab PT Goals Patient Stated Goal: to feel better PT Goal Formulation: With patient Time For Goal Achievement: 01/30/18 Potential to Achieve Goals: Good Progress towards PT goals: Progressing toward goals    Frequency    7X/week      PT Plan Current plan remains appropriate    Co-evaluation              AM-PAC PT "6 Clicks" Daily Activity  Outcome Measure  Difficulty turning over in bed (including adjusting bedclothes, sheets and blankets)?: None Difficulty moving from lying on back to sitting on the side of the bed? : None Difficulty sitting down on and standing up from a chair with arms (e.g., wheelchair, bedside commode, etc,.)?: Unable Help needed moving to and from a  bed to chair (including a wheelchair)?: A Little Help needed walking in hospital room?: A Little Help needed climbing 3-5 steps with a railing? : A Lot 6 Click Score: 17    End of Session   Activity Tolerance: Other (comment)(pt in A fib and now on telemetry) Patient left: with call bell/phone within reach;with family/visitor present;in bed Nurse Communication: Mobility status PT Visit Diagnosis: Other abnormalities of gait and mobility (R26.89);Pain Pain - Right/Left: Right Pain - part of body: Knee     Time: [id number] PT Time Calculation (min) (ACUTE ONLY): 27 min  Charges:  $Therapeutic Exercise: 23-37 mins                    G Codes:       Erline LevineKellyn Sagan Wurzel, PTA Pager: [phone]) [phone]     Carolynne EdouardKellyn R Brianda Beitler 01/19/2018, 3:36 PM

## 2018-01-19 NOTE — Progress Notes (Signed)
Occupational Therapy Treatment Patient Details Name: Brooke PaceRichard K Nienhaus Jr. MRN: [id number] DOB: 04/08/1951 Today's Date: 01/19/2018    History of present illness Pt is a [age] y.o. male s/p R TKA on 01/16/18. PMH includes L TKA (2006), multiple back sxs.    OT comments  Pt's caregiver present in room and OT requested to demonstrate and practice with her a shower transfer. Caregiver providing information regarding home set up and OT discussing options at discharge. Caregiver and pt requesting to "bird bath" at discharge and are no longer interested in this information. Pt's resting HR is 108-115 bpm with RN present to assess. Pt does benefit from acute OT intervention. However, no further OT intervention at this time secondary to increased HR.    Follow Up Recommendations  No OT follow up;Supervision/Assistance - 24 hour    Equipment Recommendations  3 in 1 bedside commode    Recommendations for Other Services      Precautions / Restrictions Precautions Precautions: Knee Precaution Comments: knee precautions reviewed with pt Restrictions Weight Bearing Restrictions: Yes RLE Weight Bearing: Weight bearing as tolerated       Mobility Bed Mobility Overal bed mobility: Modified Independent Bed Mobility: Supine to Sit           General bed mobility comments: pt remained supine secondary to resting HR of 108-115 BPM  Transfers Overall transfer level: Needs assistance Equipment used: Rolling walker (2 wheeled) Transfers: Sit to/from Stand Sit to Stand: Min assist         General transfer comment: Min to come to full standing position. Pain likely increasing need for assistance    Balance Overall balance assessment: Needs assistance Sitting-balance support: No upper extremity supported;Feet supported Sitting balance-Leahy Scale: Good     Standing balance support: Bilateral upper extremity supported;During functional activity Standing balance-Leahy Scale: Fair         ADL either performed or assessed with clinical judgement   ADL    General ADL Comments: OT attempting to review and demonstrate shower transfer with caregiver but she declined at this time. Caregiver requests pt to take "bird baths" when returning home.      Vision Patient Visual Report: No change from baseline Vision Assessment?: No apparent visual deficits          Cognition Arousal/Alertness: Awake/alert Behavior During Therapy: WFL for tasks assessed/performed Overall Cognitive Status: Within Functional Limits for tasks assessed             Exercises Exercises: Total Joint Total Joint Exercises Heel Slides: Right;5 reps;Seated(patient tendency to hold breath) Long Arc Quad: AROM;Right;10 reps;Seated(patient tendency to hold breath)           Pertinent Vitals/ Pain       Pain Assessment: 0-10 Pain Score: 6  Pain Location: R knee Pain Descriptors / Indicators: Guarding;Sore;Grimacing Pain Intervention(s): Monitored during session         Frequency  Min 2X/week        Progress Toward Goals  OT Goals(current goals can now be found in the care plan section)  Progress towards OT goals: Progressing toward goals  Acute Rehab OT Goals Patient Stated Goal: to feel better OT Goal Formulation: With patient/family Time For Goal Achievement: 02/02/18 Potential to Achieve Goals: Good  Plan Discharge plan remains appropriate       AM-PAC PT "6 Clicks" Daily Activity     Outcome Measure   Help from another person eating meals?: None Help from another person taking care of personal grooming?: A  Little Help from another person toileting, which includes using toliet, bedpan, or urinal?: A Lot Help from another person bathing (including washing, rinsing, drying)?: A Little Help from another person to put on and taking off regular upper body clothing?: A Little Help from another person to put on and taking off regular lower body clothing?: A Little 6 Click Score:  18    End of Session CPM Right Knee Additional Comments: left seated in recliner with heel propped up on foam promoting extension  OT Visit Diagnosis: Other abnormalities of gait and mobility (R26.89);Pain Pain - Right/Left: Right Pain - part of body: Knee   Activity Tolerance Other (comment)(limited medically - RN present')   Patient Left in bed;with call bell/phone within reach;with family/visitor present;with bed alarm set;with nursing/sitter in room   Nurse Communication Other (comment)(RN present in room and placing pt on monitoring for HR)        Time: 1610-9604 OT Time Calculation (min): 13 min  Charges: OT General Charges $OT Visit: 1 Visit OT Treatments $Therapeutic Activity: 8-22 mins     Brandilyn Nanninga P, MS, OTR/L 01/19/2018, 11:42 AM

## 2018-01-19 NOTE — Consult Note (Addendum)
Cardiology Consultation:   Patient ID: Todd PaceRichard K Lieb Jr.; [id number]; 12/31/1950   Admit date: 01/16/2018 Date of Consult: 01/19/2018  Primary Care Provider: Corwin LevinsJohn, [name], MD Primary Cardiologist: New,  Primary Electrophysiologist:  n/a   Patient Profile:   Todd Paceichard K Tumminello Jr. is a 67 y.o. male with a hx of OA, HLD, gout, GERD, anemia, who is being seen today for the evaluation of tachycardia at the request of Dr Ranell PatrickNorris.  History of Present Illness:   [name] has no awareness of the arrhythmia. 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 He is used to a high level of physical activity. Never has felt palpitations. No hx presyncope or syncope.   Pt never gets chest pain. He has multiple MS problems, but maintains a good activity level and works out with a Psychologist, educationaltrainer. When he gets pain in his back, etc, he treats it and it goes away.  He never gets chest pain with exertion.  He has not noticed any dyspnea on exertion.  He has not noticed any fatigue or weakness.  He does not routinely monitor his blood pressure and has never been told he has an inappropriately high heart rate until yesterday.  He has no symptoms of heart failure.  Specifically, he has no dyspnea on exertion, no orthopnea, no PND.  Yesterday, at 1 PM, his heart rate was 112.  Prior to that, it had been in the 60s-80s.  His heart rate dropped to 99, but was generally greater than 100.  Today, when working with physical therapy, his heart rate went to greater than 150.  An ECG was obtained which showed rapid atrial fib, heart rate 142.  Currently, he is resting quietly in bed.  He is now on telemetry and his heart rate is in the low 100s, but is still atrial fib.   Past Medical History:  Diagnosis Date  . ALLERGIC RHINITIS 01/08/2010  . Allergy    dust mites, pet dander  . ANEMIA-NOS 01/08/2010  . ANXIETY 01/08/2010  . Atrial fibrillation with RVR (HCC) 01/19/2018  . CERVICAL RADICULOPATHY, RIGHT 01/08/2010    . CHEST PAIN 01/08/2010  . DIVERTICULOSIS, COLON 01/08/2010  . GERD (gastroesophageal reflux disease)    Zantac OTC  prn  . GOUT 01/08/2010   in past/   . HYPERLIPIDEMIA 01/08/2010  . Lumbar disc disease 08/05/2011  . Osteoarthritis of right knee    end stage  . 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 Shortness of breath    with exertion    Past Surgical History:  Procedure Laterality Date  . ANTERIOR CERVICAL DECOMP/DISCECTOMY FUSION  07/23/2012   Procedure: ANTERIOR CERVICAL DECOMPRESSION/DISCECTOMY FUSION 2 LEVELS;  Surgeon: Clydene FakeJames R Hirsch, MD;  Location: [facility];  Service: Neurosurgery;  Laterality: N/A;  Cervical three-four, four-five, redo Cervical six-seven anterior cervical decompression, discectomy fusion with allograft and plating  . BACK SURGERY  03/2004, 02/2005  . CERVICAL SPINE SURGERY  05/2010  . clavicle facture  nov. 2009   100% displacement  . COLONOSCOPY    . HERNIA REPAIR     inguinal hernia/right side/ 67 years old  . JOINT REPLACEMENT     Left knee 2006  . KNEE ARTHROSCOPY     total of 4  . left knee arthroscopy  september 2005  . lumbar disc surgury  02/2002  . lumbar disc surgury  june 2004  . NASAL RECONSTRUCTION     x 3  . s/p ENT surgury  x 3  . s/p left shoulder surgury  feb. 2009  . s/p left TKR  april 2006     Prior to Admission medications   Medication Sig Start Date End Date Taking? Authorizing Provider  atorvastatin (LIPITOR) 10 MG tablet TAKE 1 TABLET BY MOUTH EVERY DAY 12/30/17  Yes Corwin Levins, MD  Calcium Polycarbophil (FIBER-CAPS PO) Take 2 capsules by mouth daily.    Yes [provider]  fexofenadine (ALLEGRA) 180 MG tablet Take 180 mg by mouth daily.    Yes [provider]  fluticasone (FLONASE) 50 MCG/ACT nasal spray Place 2 sprays into the nose daily. For allergies.    Yes [provider]  ranitidine (ZANTAC) 150 MG tablet Take 150 mg by mouth daily as needed for heartburn.   Yes [provider]  aspirin (ASPIRIN CHILDRENS) 81 MG chewable tablet Chew 1 tablet (81 mg total) by mouth 2 (two) times daily. 01/16/18   Beverely Low, MD  methocarbamol (ROBAXIN) 500 MG tablet Take 1 tablet (500 mg total) by mouth 3 (three) times daily as needed. 01/16/18   Beverely Low, MD  naproxen sodium (ALEVE) 220 MG tablet Take 1 tablet (220 mg total) by mouth 2 (two) times daily as needed. 01/16/18   Beverely Low, MD  oxyCODONE (ROXICODONE) 5 MG immediate release tablet Take 1 tablet (5 mg total) by mouth every 4 (four) hours as needed for severe pain. 01/16/18   Beverely Low, MD    Inpatient Medications: Scheduled Meds: . aspirin  81 mg Oral BID  . atorvastatin  10 mg Oral q1800  . docusate sodium  100 mg Oral BID  . famotidine  20 mg Oral Daily  . ferrous sulfate  325 mg Oral TID PC  . fluticasone  2 spray Each Nare Daily  . loratadine  10 mg Oral Daily  . polycarbophil  1,250 mg Oral Daily   Continuous Infusions: . sodium chloride 50 mL/hr at 01/17/18 0938  . methocarbamol (ROBAXIN)  IV     PRN Meds: acetaminophen **OR** acetaminophen, bisacodyl, HYDROmorphone (DILAUDID) injection, ketorolac, menthol-cetylpyridinium **OR** phenol, methocarbamol **OR** methocarbamol (ROBAXIN)  IV, metoCLOPramide **OR** metoCLOPramide (REGLAN) injection, ondansetron **OR** ondansetron (ZOFRAN) IV, oxyCODONE, oxyCODONE, polyethylene glycol  Allergies:    Allergies  Allergen Reactions  . Dust Mite Extract Other (See Comments)    Nasal Congestion  . Other Other (See Comments)    Pet Dander- Runny nose    Social History:   Social History   Socioeconomic History  . Marital status: Married    Spouse name: Not on file  . Number of children: Not on file  . Years of education: Not on file  . Highest education level: Not on file  Social Needs  . Financial resource strain: Not on file  . Food insecurity - worry: Not on file  . Food insecurity - inability: Not on file  . Transportation needs  - medical: Not on file  . Transportation needs - non-medical: Not on file  Occupational History  . 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 Smoking status: Never Smoker  . Smokeless tobacco: Never Used  Substance and Sexual Activity  . Alcohol use: Yes    Alcohol/week: 7.2 oz    Types: 12 Cans of beer per week    Comment: 3-4 days a week  . Drug use: No  . Sexual activity: Not on file  Other Topics Concern  . Not on file  Social History Narrative  .  Not on file    Family History:   Family History  Problem Relation Age of Onset  . Bradycardia Father 60       Pacemaker  . Lymphoma Mother   . Alzheimer's disease Mother   . Pancreatic cancer Brother   . Colon cancer Neg Hx    Family Status:  Family Status  Relation Name Status  . Father  Alive       pacer  . Other parent (Not Specified)       DJD  . Mother  Deceased  . Brother  Deceased  . Sister  Alive  . Brother  Alive  . Neg Hx  (Not Specified)    ROS:  Please see the history of present illness.  All other ROS reviewed and negative.     Physical Exam/Data:   Vitals:   01/18/18 1300 01/18/18 2052 01/19/18 0429 01/19/18 0937  BP: 121/74 128/66 135/73 114/76  Pulse: (!) 112 99 (!) 101 (!) 130  Resp: 18  18   Temp: 99.1 F (37.3 C) 98.9 F (37.2 C) 98.4 F (36.9 C)   TempSrc: Oral Oral Oral   SpO2: 98% 99% 99% 96%  Weight:      Height:        Intake/Output Summary (Last 24 hours) at 01/19/2018 1415 Last data filed at 01/19/2018 0840 Gross per 24 hour  Intake 356 ml  Output 900 ml  Net -544 ml   Filed Weights   01/16/18 0612 01/16/18 1200  Weight: 230 lb (104.3 kg) 229 lb 15 oz (104.3 kg)   Body mass index is 32.99 kg/m.  General:  Well nourished, well developed, in no acute distress HEENT: normal Lymph: no adenopathy Neck: no JVD Endocrine:  No thryomegaly Vascular: No carotid bruits; 4/4 extremity pulses 2+, without bruits  Cardiac:  normal S1, S2; RRR; no murmur   Lungs:  clear  to auscultation bilaterally, no wheezing, rhonchi or rales  Abd: soft, nontender, no hepatomegaly  Ext: Trace  RLE edema, none on the left Musculoskeletal:  No deformities, BUE and BLE strength normal and equal Skin: warm and dry  Neuro:  CNs 2-12 intact, no focal abnormalities noted Psych:  Normal affect   EKG:  The EKG was personally reviewed and demonstrates:  Atrial fib, HR 118, no acute ischemic changes, 1 PVC Telemetry:  Telemetry was personally reviewed and demonstrates: Atrial fib, RVR, heart rate generally 100-120  Relevant CV Studies:  None  Laboratory Data:  Chemistry Recent Labs  Lab 01/17/18 0517  NA 136  K 4.1  CL 100*  CO2 27  GLUCOSE 124*  BUN 10  CREATININE 1.00  CALCIUM 8.7*  GFRNONAA >60  GFRAA >60  ANIONGAP 9    Lab Results  Component Value Date   ALT 34 01/08/2018   AST 35 01/08/2018   ALKPHOS 55 01/08/2018   BILITOT 1.2 01/08/2018   Hematology Recent Labs  Lab 01/17/18 0517 01/18/18 0359 01/19/18 0452  WBC 10.0 8.5 7.8  RBC 4.48 4.19* 4.07*  HGB 13.7 12.7* 12.5*  HCT 39.8 37.3* 36.2*  MCV 88.8 89.0 88.9  MCH 30.6 30.3 30.7  MCHC 34.4 34.0 34.5  RDW 12.6 12.5 12.4  PLT 166 148* 150   TSH:  Lab Results  Component Value Date   TSH 2.45 12/16/2017   Lipids: Lab Results  Component Value Date   CHOL 160 12/16/2017   HDL 62.90 12/16/2017   LDLCALC 79 12/16/2017   LDLDIRECT 142.0 09/29/2012   TRIG  88.0 12/16/2017   CHOLHDL 3 12/16/2017   HgbA1c: Lab Results  Component Value Date   HGBA1C 5.5 12/16/2017   Magnesium: No results found for: MG   Radiology/Studies:  Dg Knee Right Port  Result Date: 01/16/2018 CLINICAL DATA:  Status post total knee replacement EXAM: PORTABLE RIGHT KNEE - 1-2 VIEW COMPARISON:  None. FINDINGS: Frontal and lateral views were obtained. The patient is status post total knee arthroplasty with prosthetic components well-seated. No acute fracture or dislocation. There are several small calcifications  surrounding the patella, likely residua of old trauma. There are foci of soft tissue air in the joint, an expected postoperative finding. IMPRESSION: Prosthetic components well-seated. No acute fracture or dislocation. Several small calcifications in the peripatellar region, likely due to old trauma. Electronically Signed   By: Bretta Bang III M.D.   On: 01/16/2018 10:56    Assessment and Plan:   Principal Problem: 1.  Status post total knee replacement, right -Patient was otherwise ready for discharge until the atrial fibrillation was found -No complications from the surgery, patient is progressing well with physical therapy. -Management per Dr. Debby Bud  Active Problems: 2.  Atrial fibrillation with RVR (HCC) -Unknown duration but most likely started yesterday around the time his heart rate was noted to be elevated at 112. - unknown if he has ever had it before. - CHA2DS2VASc= 1 (age x 1) -However, feel he will benefit by being in sinus rhythm and recommend anticoagulation.  Discuss Xarelto versus Eliquis with MD -We will check echo as outpatient -TSH has been normal when checked yearly by Dr. Jonny Ruiz, last time was January 2019, MD advise if recheck as needed. -After he has been anticoagulated for 3 weeks, he can be scheduled for cardioversion if he is still in atrial fib. -Atrial fib may be related to the stress of the surgery, he may convert spontaneously. -Discuss adding Cardizem CD 120 mg daily with MD. - d/c in am if HR better controlled and otherwise ok   For questions or updates, please contact [facility] Please consult [email or web address] for contact info under Cardiology/STEMI.   Signed, Theodore Demark, PA-C  01/19/2018 2:15 PM   I have examined the patient and reviewed assessment and plan and discussed with patient.  Agree with above as stated.  New onset AFib.  Start Cardizem for rate control and Xarelto for stroke prevention.   This patients CHA2DS2-VASc Score and  unadjusted Ischemic Stroke Rate (% per year) is equal to 0.6 % stroke rate/year from a score of 1  Above score calculated as 1 point each if present [CHF, HTN, DM, Vascular=MI/PAD/Aortic Plaque, Age if 65-74, or Male] Above score calculated as 2 points each if present [Age > 75, or Stroke/TIA/TE]  Would still use Xarelto and consider DCCV in 3-4 weeks if AFib persists.  Echo as well to look for structural heart disease.    Lance Muss

## 2018-01-19 NOTE — Progress Notes (Signed)
ANTICOAGULATION CONSULT NOTE - Initial Consult  Pharmacy Consult for Xarelto Indication: New onset atrial fibrillation  Allergies  Allergen Reactions  . Dust Mite Extract Other (See Comments)    Nasal Congestion  . Other Other (See Comments)    Pet Dander- Runny nose    Patient Measurements: Height: 5\' 10"  (177.8 cm) Weight: 229 lb 15 oz (104.3 kg) IBW/kg (Calculated) : 73  Vital Signs: Temp: 98.8 F (37.1 C) (02/25 1520) Temp Source: Oral (02/25 1520) BP: 132/76 (02/25 1520) Pulse Rate: 121 (02/25 1520)  Labs: Recent Labs    01/17/18 0517 01/18/18 0359 01/19/18 0452  HGB 13.7 12.7* 12.5*  HCT 39.8 37.3* 36.2*  PLT 166 148* 150  CREATININE 1.00  --   --     Estimated Creatinine Clearance: 86.7 mL/min (by C-G formula based on SCr of 1 mg/dL).   Medical History: Past Medical History:  Diagnosis Date  . ALLERGIC RHINITIS 01/08/2010  . Allergy    dust mites, pet dander  . ANEMIA-NOS 01/08/2010  . ANXIETY 01/08/2010  . Atrial fibrillation with RVR (HCC) 01/19/2018  . CERVICAL RADICULOPATHY, RIGHT 01/08/2010  . CHEST PAIN 01/08/2010  . DIVERTICULOSIS, COLON 01/08/2010  . GERD (gastroesophageal reflux disease)    Zantac OTC  prn  . GOUT 01/08/2010   in past/   . HYPERLIPIDEMIA 01/08/2010  . Lumbar disc disease 08/05/2011  . Osteoarthritis of right knee    end stage  . 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 Shortness of breath    with exertion    Assessment: [age] YOM who presented on 2/22 for a planned R-TKA and during this admission was found to have new-onset Afib. Cardiology was consulted and plans are to transition the patient to Xarelto.   The patient was educated on Xarelto today.  Goal of Therapy:  Appropriate anticoagulation for indication and hepatic/renal function   Plan:  1. Start Xarelto 20 mg daily with supper 2. Education done and paperwork placed in discharge AVS 3. Will monitor renal function, signs/symtpoms of bleeding  Thank you  for allowing pharmacy to be a part of this patient's care.  Georgina PillionElizabeth Elajah Kunsman, PharmD, BCPS Clinical Pharmacist Pager: [phone] 01/19/2018 3:45 PM

## 2018-01-19 NOTE — Progress Notes (Signed)
Called Dr. Ranell PatrickNorris after getting 12 lead EKG to inform him that patient was in A.fib. He said to cancel the discharge order and they would consult cardiology. I placed patient on cardiac monitoring.

## 2018-01-19 NOTE — Discharge Instructions (Signed)
Ice to the knee as much as you can.  Keep the incision clean and dry and covered for one week, then ok to get it wet in the shower.  Ok to put full weight on the right knee.  Do exercises every hour while awake.  Wear the TED hose full time until follow up with Dr Ranell PatrickNorris in two weeks.  DO NOT prop anything under the knee or it will make you stiff.  Wear knee immobilizer at night to maintain knee extension.  Information on my medicine - XARELTO (Rivaroxaban)  This medication education was reviewed with me or my healthcare representative as part of my discharge preparation.    Why was Xarelto prescribed for you? Xarelto was prescribed for you to reduce the risk of a blood clot forming that can cause a stroke if you have a medical condition called atrial fibrillation (a type of irregular heartbeat).  What do you need to know about xarelto ? Take your Xarelto ONCE DAILY at the same time every day with your evening meal. If you have difficulty swallowing the tablet whole, you may crush it and mix in applesauce just prior to taking your dose.  Take Xarelto exactly as prescribed by your doctor and DO NOT stop taking Xarelto without talking to the doctor who prescribed the medication.  Stopping without other stroke prevention medication to take the place of Xarelto may increase your risk of developing a clot that causes a stroke.  Refill your prescription before you run out.  After discharge, you should have regular check-up appointments with your healthcare provider that is prescribing your Xarelto.  In the future your dose may need to be changed if your kidney function or weight changes by a significant amount.  What do you do if you miss a dose? If you are taking Xarelto ONCE DAILY and you miss a dose, take it as soon as you remember on the same day then continue your regularly scheduled once daily regimen the next day. Do not take two doses of Xarelto at the same time or on the same  day.   Important Safety Information A possible side effect of Xarelto is bleeding. You should call your healthcare provider right away if you experience any of the following: ? Bleeding from an injury or your nose that does not stop. ? Unusual colored urine (red or dark brown) or unusual colored stools (red or black). ? Unusual bruising for unknown reasons. ? A serious fall or if you hit your head (even if there is no bleeding).  Some medicines may interact with Xarelto and might increase your risk of bleeding while on Xarelto. To help avoid this, consult your healthcare provider or pharmacist prior to using any new prescription or non-prescription medications, including herbals, vitamins, non-steroidal anti-inflammatory drugs (NSAIDs) and supplements.  This website has more information on Xarelto: [email or web address].

## 2018-01-19 NOTE — Progress Notes (Signed)
Orthopedics Progress Note  Subjective: Patient feeling lack of energy when up with therapy  Objective:  Vitals:   01/19/18 0937 01/19/18 1520  BP: 114/76 132/76  Pulse: (!) 130 (!) 121  Resp:  16  Temp:  98.8 F (37.1 C)  SpO2: 96% 98%    General: Awake and alert  Musculoskeletal: right knee dressing intact, ROM near full extension to 85 degrees flexion, AAROM, No Cords, Neg Homan's Neurovascularly intact  Lab Results  Component Value Date   WBC 7.8 01/19/2018   HGB 12.5 (L) 01/19/2018   HCT 36.2 (L) 01/19/2018   MCV 88.9 01/19/2018   PLT 150 01/19/2018       Component Value Date/Time   NA 136 01/17/2018 0517   K 4.1 01/17/2018 0517   CL 100 (L) 01/17/2018 0517   CO2 27 01/17/2018 0517   GLUCOSE 124 (H) 01/17/2018 0517   GLUCOSE 104 (H) 10/23/2006 0848   BUN 10 01/17/2018 0517   CREATININE 1.00 01/17/2018 0517   CALCIUM 8.7 (L) 01/17/2018 0517   GFRNONAA >60 01/17/2018 0517   GFRAA >60 01/17/2018 0517    Lab Results  Component Value Date   INR 1.03 07/23/2012    Assessment/Plan: POD #3 s/p Procedure(s): RIGHT TOTAL KNEE ARTHROPLASTY New Onset AFib - appreciate nursing and cardiology assistance. Patient starting on Cardizem and Xarelto, will stop ASA Plan discharge tomorrow after Cards sees and ok's based upon response to [employer]orporatedCardizem  Steven R. Ranell PatrickNorris, MD 01/19/2018 4:15 PM

## 2018-01-19 NOTE — Progress Notes (Signed)
   Subjective: 3 Days Post-Op Procedure(s) (LRB): RIGHT TOTAL KNEE ARTHROPLASTY (Right)  Pt having some tachycardia mainly with therapy and with movement Normally pulse is 90-100 but rises to 120 during activity No signs of anemia and pt is asymptomatic Would like to d/c home today Therapy going well Patient reports pain as moderate.  Objective:   VITALS:   Vitals:   01/18/18 2052 01/19/18 0429  BP: 128/66 135/73  Pulse: 99 (!) 101  Resp:  18  Temp: 98.9 F (37.2 C) 98.4 F (36.9 C)  SpO2: 99% 99%    Right knee incision healing well No drainage or erythema No rashes or edema distally nv intact distally  LABS Recent Labs    01/17/18 0517 01/18/18 0359 01/19/18 0452  HGB 13.7 12.7* 12.5*  HCT 39.8 37.3* 36.2*  WBC 10.0 8.5 7.8  PLT 166 148* 150    Recent Labs    01/17/18 0517  NA 136  K 4.1  BUN 10  CREATININE 1.00  GLUCOSE 124*     Assessment/Plan: 3 Days Post-Op Procedure(s) (LRB): RIGHT TOTAL KNEE ARTHROPLASTY (Right) Overall pt doing well with therapy and pain control Pt is not anemic and tachycardia most likely due to pain with movement Plan for d/c home today and start outpatient therapy Pt in agreement F/u in 2 weeks in the office     Brad Dixon PA-C, MPAS [facility] now [facility]  [place] [address], [place], [place] [address] Phone: [phone] [email or web address] Facebook  [employer]agram  LinkedIn  Twitter

## 2018-01-19 NOTE — Op Note (Signed)
NAME:  Todd Barrett, Todd Barrett                 ACCOUNT NO.:  MEDICAL RECORD NO.:  [id number]  LOCATION:                                 FACILITY:  PHYSICIAN:  Almedia Balls. Ranell Patrick, M.D.      DATE OF BIRTH:  DATE OF PROCEDURE:  01/16/2018 DATE OF DISCHARGE:                              OPERATIVE REPORT   PREOPERATIVE DIAGNOSIS:  Right knee end-stage osteoarthritis.  POSTOPERATIVE DIAGNOSIS:  Right knee end-stage osteoarthritis.  PROCEDURE PERFORMED:  Right total knee arthroplasty using DePuy Sigma rotating platform prosthesis.  ATTENDING SURGEON:  Almedia Balls. Ranell Patrick, MD.  ASSISTANT:  Konrad Felix Dixon, [place], who was scrubbed during the entire procedure and necessary for satisfactory completion of surgery.  ANESTHESIA:  General anesthesia was used plus adductor canal block.  ESTIMATED BLOOD LOSS:  Minimal.  FLUID REPLACEMENT:  1500 mL of crystalloid.  INSTRUMENT COUNTS:  Correct.  COMPLICATIONS:  There are no complications.  Perioperative antibiotics were given.  TOURNIQUET TIME:  One hour and 20 minutes at 315 mmHg.  INDICATIONS:  The patient is a 67 year old male with progressively worsening right knee pain secondary to end-stage arthritis.  The patient has primary osteoarthritis with bone-on-bone on standing x-rays.  The patient has had progressive pain interfering with ADLs and causing pain at night and at rest.  The patient desires total knee arthroplasty to relieve pain and restore function.  Risks and benefits of surgery were discussed in detail with the patient and his family.  Informed consent obtained.  DESCRIPTION OF PROCEDURE:  After an adequate level of anesthesia was achieved, the patient was positioned supine on the operating table. Right leg was correctly identified.  A nonsterile tourniquet was placed in proximal thigh.  Right leg was sterilely prepped and draped in the usual manner.  The left leg was padded appropriately and secured to the operating table.  A  time-out was called.  Following our time-out and our sterile prep and drape, we elevated the leg and exsanguinated using Esmarch bandage.  We elevated the tourniquet to 315 mmHg.  Longitudinal midline incision was created with the knee in flexion.  Dissection down through subcutaneous tissues.  The medial parapatellar arthrotomy was created with a fresh #10 blade scalpel.  Lateral patellofemoral ligaments divided.  Patella was everted.  We then flexed the knee and entered the distal femur with a step-cut drill.  We then placed our distal femoral intramedullary resection guide and resected 10 mm off the distal femur with a setting of 5 degrees right.  Once we had the distal femoral cut, we sized this knee to a size 5 anterior down.  We performed our anterior, posterior, and chamfer cuts without complication.  We then resected ACL, PCL, and remaining meniscal tissue.  We subluxed the tibia anteriorly.  With retractors placed, we then performed our proximal tibial cut 90 degrees perpendicular to the long axis of the tibia with the external alignment jig with minimal posterior slope for the posterior cruciate substituting prosthesis.  Once we had that completed, we irrigated thoroughly.  We checked our flexion and extension gaps, which were symmetric at 10 mm.  We then completed our tibial preparation with a modular  drill and keel punch.  We then progressed to the femur. We did our box cut for the posterior cruciate substituting femoral prosthesis.  We then placed our trial tibial and femoral components in place, reduced with a 10 poly, had a nicely aligned knee with no instability with flexion and extension.  We were able to get full extension as well.  We resurfaced the patella.  Going from 25 mm thickness, we resected down to 15 mm and then drilled the lugs for the 41 patella.  Once we had that done, we placed our trial patellar button on the patella and then reduced the knee.  We were  pleased with our soft tissue balancing and our patellar tracking.  We had normal patellar tracking with no-touch technique.  We removed all trial components, pulse irrigated, dried the bone and then vacuum mixed high-viscosity cement on the back table.  We then cemented the tibia, femur, and the patellar components.  We placed a 10 poly insert trial in and reduced the knee.  We maintained the knee in extension with good compression on the components until cement hardened.  We removed excess cement with 0.25-inch curved osteotome, re-trialed and we were happy with the size 10-mm component.  We removed that, checked for cement and backed the knee and then placed our real size 10 polyethylene insert in place and reduced the knee and had a nice little snap medially.  We were able to get full extension, nice and stable both in flexion extension, normal patellar tracking with no-touch technique.  We irrigated thoroughly and closed the parapatellar arthrotomy with #1 Vicryl suture followed by 2-0 Vicryl for subcutaneous closure and 4-0 Monocryl for skin.  Steri-Strips applied followed by sterile dressing.  The patient tolerated the surgery well.     Almedia BallsSteven R. Ranell PatrickNorris, M.D.     SRN/MEDQ  D:  01/16/2018  T:  01/16/2018  Job:  [id number]

## 2018-01-19 NOTE — Progress Notes (Signed)
Physical Therapy Treatment Patient Details Name: Todd Barrett. MRN: [id number] DOB: [birth year] Today's Date: 01/19/2018    History of Present Illness Pt is a 67 y.o. male s/p R TKA on 01/16/18. PMH includes L TKA (2006), multiple back sxs.     PT Comments    This session limited by elevated HR and fluctuations in HR. Pt with HR ranging from 115 bpm to 152 bpm. RN notified. Min guard A for safe ambulation. Continue to progress as tolerated.    Follow Up Recommendations  Follow surgeon's recommendation for DC plan and follow-up therapies     Equipment Recommendations  None recommended by PT    Recommendations for Other Services       Precautions / Restrictions Precautions Precautions: Knee Precaution Comments: knee precautions reviewed with pt Restrictions Weight Bearing Restrictions: Yes RLE Weight Bearing: Weight bearing as tolerated    Mobility  Bed Mobility Overal bed mobility: Modified Independent Bed Mobility: Supine to Sit           General bed mobility comments: pt remained supine secondary to resting HR of 108-115 BPM  Transfers Overall transfer level: Needs assistance Equipment used: Rolling walker (2 wheeled) Transfers: Sit to/from Stand Sit to Stand: Min guard         General transfer comment: min guard for safety; cues for safe hand placement  Ambulation/Gait Ambulation/Gait assistance: Min guard Ambulation Distance (Feet): 90 Feet Assistive device: Rolling walker (2 wheeled) Gait Pattern/deviations: Decreased stride length;Decreased weight shift to right;Antalgic;Step-through pattern;Trunk flexed Gait velocity: decreased   General Gait Details: cues for posture, breathing technique, and sequencing; pt with HR up to 152 bpm with mobility   Stairs            Wheelchair Mobility    Modified Rankin (Stroke Patients Only)       Balance Overall balance assessment: Needs assistance Sitting-balance support: No upper extremity  supported;Feet supported Sitting balance-Leahy Scale: Good     Standing balance support: Bilateral upper extremity supported;During functional activity Standing balance-Leahy Scale: Fair                              Cognition Arousal/Alertness: Awake/alert Behavior During Therapy: WFL for tasks assessed/performed Overall Cognitive Status: Within Functional Limits for tasks assessed                                        Exercises      General Comments General comments (skin integrity, edema, etc.): pt with HR ranging from 115 bpm to 152 bpm and with fluctuations often--RN notified      Pertinent Vitals/Pain Pain Assessment: 0-10 Pain Score: 6  Pain Location: R knee Pain Descriptors / Indicators: Guarding;Sore;Grimacing Pain Intervention(s): Monitored during session    Home Living                      Prior Function            PT Goals (current goals can now be found in the care plan section) Acute Rehab PT Goals Patient Stated Goal: to feel better PT Goal Formulation: With patient Time For Goal Achievement: 01/30/18 Potential to Achieve Goals: Good Progress towards PT goals: Progressing toward goals    Frequency    7X/week      PT Plan Current plan remains appropriate  Co-evaluation              AM-PAC PT "6 Clicks" Daily Activity  Outcome Measure  Difficulty turning over in bed (including adjusting bedclothes, sheets and blankets)?: None Difficulty moving from lying on back to sitting on the side of the bed? : None Difficulty sitting down on and standing up from a chair with arms (e.g., wheelchair, bedside commode, etc,.)?: Unable Help needed moving to and from a bed to chair (including a wheelchair)?: A Little Help needed walking in hospital room?: A Little Help needed climbing 3-5 steps with a railing? : A Lot 6 Click Score: 17    End of Session Equipment Utilized During Treatment: Gait belt Activity  Tolerance: Other (comment)(elevated and fluctuating HR) Patient left: in chair;with call bell/phone within reach;with family/visitor present Nurse Communication: Mobility status PT Visit Diagnosis: Other abnormalities of gait and mobility (R26.89);Pain Pain - Right/Left: Right Pain - part of body: Knee     Time: [id number] PT Time Calculation (min) (ACUTE ONLY): 30 min  Charges:  $Gait Training: 8-22 mins $Therapeutic Activity: 8-22 mins                    G Codes:       Todd Barrett, PTA Pager: [phone]     Todd Barrett 01/19/2018, 1:21 PM

## 2018-01-19 NOTE — Discharge Summary (Addendum)
Orthopedic Discharge Summary        Physician Discharge Summary  Patient ID: Todd Barrett. MRN: [id number] DOB/AGE: 68-Mar-1952 68 y.o.  Admit date: 01/16/2018 Discharge date: 01/20/2018  Procedures:  Procedure(s) (LRB): RIGHT TOTAL KNEE ARTHROPLASTY (Right)  Attending Physician:  Dr. Malon Kindle  Admission Diagnoses:   Right knee end stage osteoarthritis   Discharge Diagnoses: right knee end stage osteoarthritis  Atrial Fibrillation with RVR   Past Medical History:  Diagnosis Date  . ALLERGIC RHINITIS 01/08/2010  . Allergy    dust mites, pet dander  . ANEMIA-NOS 01/08/2010  . ANXIETY 01/08/2010  . CERVICAL RADICULOPATHY, RIGHT 01/08/2010  . CHEST PAIN 01/08/2010  . DIVERTICULOSIS, COLON 01/08/2010  . GERD (gastroesophageal reflux disease)    Zantac OTC  prn  . GOUT 01/08/2010   in past/   . HYPERLIPIDEMIA 01/08/2010  . Lumbar disc disease 08/05/2011  . Osteoarthritis of right knee    end stage  . 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 Shortness of breath    with exertion    PCP: Corwin Levins, MD   Discharged Condition: stable  Hospital Course:  Patient underwent the above stated procedure on 01/16/2018. Patient tolerated the procedure well and brought to the recovery room in good condition and subsequently to the floor. Patient had a hospital course complicated by A Fib with RVR requiring cardiology consult.  Patient started on Xarelto and Cardizem with good control of rate and improvement in symptoms.  Patient was stable for discharge.   Disposition: 01-Home or Self Care with follow up in 2 weeks  Follow up with Cardiology as directed   Follow-up Information    Beverely Low, MD. Call in 2 weeks.   Specialty:  Orthopedic Surgery Why:  [phone] Contact information: [address] [place] [address] [phone]           Discharge Instructions    Call MD / Call 911   Complete by:  As directed    If you  experience chest pain or shortness of breath, CALL 911 and be transported to the hospital emergency room.  If you develope a fever above 101 F, pus (white drainage) or increased drainage or redness at the wound, or calf pain, call your surgeon's office.   Constipation Prevention   Complete by:  As directed    Drink plenty of fluids.  Prune juice may be helpful.  You may use a stool softener, such as Colace (over the counter) 100 mg twice a day.  Use MiraLax (over the counter) for constipation as needed.   Diet - low sodium heart healthy   Complete by:  As directed    Increase activity slowly as tolerated   Complete by:  As directed       Allergies as of 01/19/2018      Reactions   Dust Mite Extract Other (See Comments)   Nasal Congestion   Other Other (See Comments)   Pet Dander- Runny nose      Medication List    TAKE these medications   aspirin 81 MG chewable tablet Commonly known as:  ASPIRIN CHILDRENS Chew 1 tablet (81 mg total) by mouth 2 (two) times daily.   atorvastatin 10 MG tablet Commonly known as:  LIPITOR TAKE 1 TABLET BY MOUTH EVERY DAY   fexofenadine 180 MG tablet Commonly known as:  ALLEGRA Take 180 mg by mouth daily.   FIBER-CAPS PO Take 2 capsules by mouth daily.   fluticasone  50 MCG/ACT nasal spray Commonly known as:  FLONASE Place 2 sprays into the nose daily. For allergies.   methocarbamol 500 MG tablet Commonly known as:  ROBAXIN Take 1 tablet (500 mg total) by mouth 3 (three) times daily as needed.   naproxen sodium 220 MG tablet Commonly known as:  ALEVE Take 1 tablet (220 mg total) by mouth 2 (two) times daily as needed. What changed:    how much to take  when to take this  reasons to take this   oxyCODONE 5 MG immediate release tablet Commonly known as:  ROXICODONE Take 1 tablet (5 mg total) by mouth every 4 (four) hours as needed for severe pain.   ranitidine 150 MG tablet Commonly known as:  ZANTAC Take 150 mg by mouth daily as  needed for heartburn.     Cardizem 120 mg po QDay   Xarelto 20 mg po q day with food    Signed: Thea Gisthomas B Dixon 01/19/2018, 9:29 AM  [facility] now [facility]  [place] [address], [place], [place] [address] Phone: [phone] Facebook  Instagram  [employer]  Twitter

## 2018-01-19 NOTE — Progress Notes (Signed)
Orthopedic Tech Progress Note Patient Details:  Todd PaceRichard K Vivier Jr. 11/20/1951 [id number]  Patient ID: Todd Paceichard K Brugger Jr., male   DOB: 11/29/1950, 67 y.o.   MRN: [id number]   Todd Barrett, Todd Barrett 01/19/2018, 1:20 PM Placed pt's rle on cpm @0 -60 degrees @1320 ; RN notified

## 2018-01-20 MED ORDER — RIVAROXABAN 20 MG PO TABS: 20.0000 mg | ORAL_TABLET | Freq: Every day | ORAL | 0 refills | Status: DC

## 2018-01-20 MED ORDER — DILTIAZEM HCL ER COATED BEADS 120 MG PO CP24: 120.0000 mg | ORAL_CAPSULE | Freq: Every day | ORAL | 0 refills | Status: DC

## 2018-01-20 NOTE — Progress Notes (Addendum)
Progress Note  Patient Name: Todd PaceRichard K Klammer Jr. Date of Encounter: 01/20/2018  Primary Cardiologist: No primary care provider on file.   Subjective   Feels well. Wants to go home.   Inpatient Medications    Scheduled Meds: . atorvastatin  10 mg Oral q1800  . diltiazem  120 mg Oral Daily  . docusate sodium  100 mg Oral BID  . famotidine  20 mg Oral Daily  . ferrous sulfate  325 mg Oral TID PC  . fluticasone  2 spray Each Nare Daily  . loratadine  10 mg Oral Daily  . polycarbophil  1,250 mg Oral Daily  . rivaroxaban  20 mg Oral Q supper  . sodium chloride flush  3 mL Intravenous Q12H   Continuous Infusions: . sodium chloride    . methocarbamol (ROBAXIN)  IV     PRN Meds: sodium chloride, acetaminophen **OR** acetaminophen, bisacodyl, HYDROmorphone (DILAUDID) injection, ketorolac, menthol-cetylpyridinium **OR** phenol, methocarbamol **OR** methocarbamol (ROBAXIN)  IV, metoCLOPramide **OR** metoCLOPramide (REGLAN) injection, ondansetron **OR** ondansetron (ZOFRAN) IV, oxyCODONE, oxyCODONE, polyethylene glycol, sodium chloride flush   Vital Signs    Vitals:   01/19/18 2154 01/19/18 2320 01/20/18 0621 01/20/18 0750  BP:  113/68 119/76 122/68  Pulse:  76 70 66  Resp:  18 17 16   Temp:  98.5 F (36.9 C) 98.7 F (37.1 C)   TempSrc:  Oral Oral   SpO2: 96% 93% 100% 97%  Weight:      Height:        Intake/Output Summary (Last 24 hours) at 01/20/2018 1125 Last data filed at 01/20/2018 0900 Gross per 24 hour  Intake 4300 ml  Output 700 ml  Net 3600 ml   Filed Weights   01/16/18 0612 01/16/18 1200  Weight: 230 lb (104.3 kg) 229 lb 15 oz (104.3 kg)    Telemetry    NSR - Personally Reviewed  ECG      Physical Exam   GEN: No acute distress.   Neck: No JVD Cardiac: RRR, no murmurs, rubs, or gallops.  Respiratory: Clear to auscultation bilaterally. GI: Soft, nontender, non-distended  MS: No edema; No deformity. Right knee brace Neuro:  Nonfocal  Psych:  Normal affect   Labs    Chemistry Recent Labs  Lab 01/17/18 0517  NA 136  K 4.1  CL 100*  CO2 27  GLUCOSE 124*  BUN 10  CREATININE 1.00  CALCIUM 8.7*  GFRNONAA >60  GFRAA >60  ANIONGAP 9     Hematology Recent Labs  Lab 01/17/18 0517 01/18/18 0359 01/19/18 0452  WBC 10.0 8.5 7.8  RBC 4.48 4.19* 4.07*  HGB 13.7 12.7* 12.5*  HCT 39.8 37.3* 36.2*  MCV 88.8 89.0 88.9  MCH 30.6 30.3 30.7  MCHC 34.4 34.0 34.5  RDW 12.6 12.5 12.4  PLT 166 148* 150    Cardiac EnzymesNo results for input(s): TROPONINI in the last 168 hours. No results for input(s): TROPIPOC in the last 168 hours.   BNPNo results for input(s): BNP, PROBNP in the last 168 hours.   DDimer No results for input(s): DDIMER in the last 168 hours.   Radiology    No results found.  Cardiac Studies   Echo pending  Patient Profile     67 y.o. male with PAF  Assessment & Plan    1) PAF: He converted to NSR. Continue diltiazem and Xarelto at current doses.  F/u with Cardiology in 2-3 weeks.  Will arrange.  He will need echo.  If it  is not done as an inpatient, will do it as an outpatient.  He is agreeable to this plan.  For questions or updates, please contact [facility] Please consult [email or web address] for contact info under Cardiology/STEMI.      Signed, Lance Muss, MD  01/20/2018, 11:25 AM

## 2018-01-20 NOTE — Care Management Note (Signed)
Case Management Note  Patient Details  Name: Todd PaceRichard K Tungate Jr. MRN: [id number] Date of Birth: 11/14/1951  Subjective/Objective:   67 yr old gentleman s/p right total knee arthroplasty.                  Action/Plan: Case manager spoke with patient concerning discharge plan and DME. Patient says that he does not want Home Health, has actually been setup to begin outpatient therapy on Wednesday, January 21, 2018 at 9:00am.   Expected Discharge Date:  01/20/18               Expected Discharge Plan:  Home self care  In-House Referral:  NA  Discharge planning Services  CM Consult  Post Acute Care Choice:    Choice offered to:  Patient  DME Arranged:  3-N-1, Walker rolling, CPM DME Agency:  [employer]  HH Arranged:  (Patient going to outpatient therapy) HH Agency[facility]  [facility]  Status of Service:  Completed, signed off  If discussed at [employer] Meetings, dates discussed:    Additional Comments:  Todd GuthrieBrady, Todd Huguley Naomi, RN 01/20/2018, 9:52 AM

## 2018-01-20 NOTE — Addendum Note (Signed)
Addendum  created 01/20/18 0830 by Phillips Groutarignan, Rashell Shambaugh, MD   Intraprocedure Blocks edited, Sign clinical note

## 2018-01-20 NOTE — Progress Notes (Signed)
NURSING PROGRESS NOTE  Todd PaceRichard K Lorimer Jr. [id number] Discharge Data: 01/20/2018 2:26 PM Attending Provider: Beverely LowNorris, Steve, MD WUJ:WJXBPCP:[name], Len BlalockJames W, MD     Todd Paceichard K Legrande Jr. to be D/C'd Home per MD order.  Discussed with the patient the After Visit Summary and all questions fully answered. All IV's discontinued with no bleeding noted. All belongings returned to patient for patient to take home. Patient sent home with printed prescriptions for oxycodone, robaxin, Xarelto, and diltiazem.  Last Vital Signs:  Blood pressure 136/69, pulse 74, temperature 98.4 F (36.9 C), temperature source Oral, resp. rate 16, height 5\' 10"  (1.778 m), weight 104.3 kg (229 lb 15 oz), SpO2 99 %.  Discharge Medication List Allergies as of 01/20/2018      Reactions   Dust Mite Extract Other (See Comments)   Nasal Congestion   Other Other (See Comments)   Pet Dander- Runny nose      Medication List    STOP taking these medications   naproxen sodium 220 MG tablet Commonly known as:  ALEVE     TAKE these medications   atorvastatin 10 MG tablet Commonly known as:  LIPITOR TAKE 1 TABLET BY MOUTH EVERY DAY   diltiazem 120 MG 24 hr capsule Commonly known as:  CARDIZEM CD Take 1 capsule (120 mg total) by mouth daily.   fexofenadine 180 MG tablet Commonly known as:  ALLEGRA Take 180 mg by mouth daily.   FIBER-CAPS PO Take 2 capsules by mouth daily.   fluticasone 50 MCG/ACT nasal spray Commonly known as:  FLONASE Place 2 sprays into the nose daily. For allergies.   methocarbamol 500 MG tablet Commonly known as:  ROBAXIN Take 1 tablet (500 mg total) by mouth 3 (three) times daily as needed.   oxyCODONE 5 MG immediate release tablet Commonly known as:  ROXICODONE Take 1 tablet (5 mg total) by mouth every 4 (four) hours as needed for severe pain.   ranitidine 150 MG tablet Commonly known as:  ZANTAC Take 150 mg by mouth daily as needed for heartburn.   rivaroxaban 20 MG Tabs  tablet Commonly known as:  XARELTO Take 1 tablet (20 mg total) by mouth daily with supper.

## 2018-01-20 NOTE — Progress Notes (Signed)
Physical Therapy Treatment Patient Details Name: Todd Barrett. MRN: [id number] DOB: [birth year] Today's Date: 01/20/2018    History of Present Illness Pt is a 67 y.o. male s/p R TKA on 01/16/18. PMH includes L TKA (2006), multiple back sxs.     PT Comments    Patient is making progress toward mobility goals. Pt overall required supervision for OOB mobility. Pt tolerated gait and stair training well and HR remained WNL throughout session. Ambulation appeared to be much less taxing for pt today. Current plan remains appropriate.    Follow Up Recommendations  Follow surgeon's recommendation for DC plan and follow-up therapies     Equipment Recommendations  None recommended by PT    Recommendations for Other Services OT consult     Precautions / Restrictions Precautions Precautions: Knee Precaution Comments: knee precautions reviewed with pt Restrictions Weight Bearing Restrictions: Yes RLE Weight Bearing: Weight bearing as tolerated    Mobility  Bed Mobility Overal bed mobility: Modified Independent                Transfers Overall transfer level: Needs assistance Equipment used: Rolling walker (2 wheeled) Transfers: Sit to/from Stand Sit to Stand: Supervision         General transfer comment: supervision for safety; cues for safe hand placement  Ambulation/Gait Ambulation/Gait assistance: Supervision Ambulation Distance (Feet): 200 Feet Assistive device: Rolling walker (2 wheeled) Gait Pattern/deviations: Decreased weight shift to right;Antalgic;Step-through pattern;Trunk flexed;Decreased stance time - right;Decreased step length - left Gait velocity: decreased   General Gait Details: cues for posture and step length symmetry; pt with improving step through pattern with increased distance   Stairs     Stair Management: One rail Right;Step to pattern;Forwards Number of Stairs: (2 steps X2) General stair comments: cues for sequencing and technique    Wheelchair Mobility    Modified Rankin (Stroke Patients Only)       Balance Overall balance assessment: Needs assistance Sitting-balance support: No upper extremity supported;Feet supported Sitting balance-Leahy Scale: Good     Standing balance support: Bilateral upper extremity supported;During functional activity Standing balance-Leahy Scale: Fair Standing balance comment: Can static stand with no UE support                            Cognition Arousal/Alertness: Awake/alert Behavior During Therapy: WFL for tasks assessed/performed Overall Cognitive Status: Within Functional Limits for tasks assessed                                        Exercises      General Comments General comments (skin integrity, edema, etc.): HR WNL throughout session      Pertinent Vitals/Pain Pain Assessment: Faces Faces Pain Scale: Hurts even more Pain Location: R LE (especially ankle) Pain Descriptors / Indicators: Sore;Grimacing Pain Intervention(s): Monitored during session;Premedicated before session;Repositioned;Ice applied    Home Living                      Prior Function            PT Goals (current goals can now be found in the care plan section) Acute Rehab PT Goals PT Goal Formulation: With patient Time For Goal Achievement: 01/30/18 Potential to Achieve Goals: Good Progress towards PT goals: Progressing toward goals    Frequency    7X/week  PT Plan Current plan remains appropriate    Co-evaluation              AM-PAC PT "6 Clicks" Daily Activity  Outcome Measure  Difficulty turning over in bed (including adjusting bedclothes, sheets and blankets)?: None Difficulty moving from lying on back to sitting on the side of the bed? : None Difficulty sitting down on and standing up from a chair with arms (e.g., wheelchair, bedside commode, etc,.)?: Unable Help needed moving to and from a bed to chair (including a  wheelchair)?: A Little Help needed walking in hospital room?: A Little Help needed climbing 3-5 steps with a railing? : A Little 6 Click Score: 18    End of Session Equipment Utilized During Treatment: Gait belt Activity Tolerance: Patient tolerated treatment well Patient left: with call bell/phone within reach;in chair Nurse Communication: Mobility status PT Visit Diagnosis: Other abnormalities of gait and mobility (R26.89);Pain Pain - Right/Left: Right Pain - part of body: Knee     Time: [id number] PT Time Calculation (min) (ACUTE ONLY): 20 min  Charges:  $Gait Training: 8-22 mins                    G Codes:       Erline LevineKellyn Jeremian Whitby, PTA Pager: [phone]     Carolynne EdouardKellyn R Jaxx Huish 01/20/2018, 9:13 AM

## 2018-01-20 NOTE — Discharge Summary (Signed)
Orthopedic Discharge Summary        Physician Discharge Summary  Patient ID: Todd PaceRichard K Minch Jr. MRN: [id number] DOB/AGE: 68/07/1951 68 y.o.  Admit date: 01/16/2018 Discharge date: 01/20/2018   Procedures:  Procedure(s) (LRB): RIGHT TOTAL KNEE ARTHROPLASTY (Right)  Attending Physician:  Dr. Malon KindleSteven Lakaisha Danish  Admission Diagnoses:   Right knee primary OA, end stage  Discharge Diagnoses:  Right knee primary OA end stage A Fib with RVR (new onset)   Past Medical History:  Diagnosis Date  . ALLERGIC RHINITIS 01/08/2010  . Allergy    dust mites, pet dander  . ANEMIA-NOS 01/08/2010  . ANXIETY 01/08/2010  . Atrial fibrillation with RVR (HCC) 01/19/2018  . CERVICAL RADICULOPATHY, RIGHT 01/08/2010  . CHEST PAIN 01/08/2010  . DIVERTICULOSIS, COLON 01/08/2010  . GERD (gastroesophageal reflux disease)    Zantac OTC  prn  . GOUT 01/08/2010   in past/   . HYPERLIPIDEMIA 01/08/2010  . Lumbar disc disease 08/05/2011  . Osteoarthritis of right knee    end stage  . 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 Shortness of breath    with exertion    PCP: Corwin LevinsJohn, [name], MD   Discharged Condition: stable  Hospital Course:  Patient underwent the above stated procedure on 01/16/2018. Patient tolerated the procedure well and brought to the recovery room in good condition and subsequently to the floor. Patient had a hospital course complicated by A Fib with RVR requiring cardiology consult.  Patient started on Xarelto and Cardizem with good control of rate and improvement in symptoms.  Patient was stable for discharge.   Disposition: 01-Home or Self Care with follow up in 2 weeks   Follow-up Information    Beverely LowNorris, Donalda Job, MD. Call in 2 week(s).   Specialty:  Orthopedic Surgery Why:  [phone] Contact information: [address] [place] [address] [phone]        Corky CraftsVaranasi, Jayadeep S, MD Follow up.   Specialties:  Cardiology, Radiology, Interventional  Cardiology Why:  Echo and follow-up appointment.  The office will call. Contact information: [address] [place] [address] [phone]           Discharge Instructions    Call MD / Call 911   Complete by:  As directed    If you experience chest pain or shortness of breath, CALL 911 and be transported to the hospital emergency room.  If you develope a fever above 101 F, pus (white drainage) or increased drainage or redness at the wound, or calf pain, call your surgeon's office.   Call MD / Call 911   Complete by:  As directed    If you experience chest pain or shortness of breath, CALL 911 and be transported to the hospital emergency room.  If you develope a fever above 101 F, pus (white drainage) or increased drainage or redness at the wound, or calf pain, call your surgeon's office.   Call MD / Call 911   Complete by:  As directed    If you experience chest pain or shortness of breath, CALL 911 and be transported to the hospital emergency room.  If you develope a fever above 101 F, pus (white drainage) or increased drainage or redness at the wound, or calf pain, call your surgeon's office.   Constipation Prevention   Complete by:  As directed    Drink plenty of fluids.  Prune juice may be helpful.  You may use a stool softener, such as  Colace (over the counter) 100 mg twice a day.  Use MiraLax (over the counter) for constipation as needed.   Constipation Prevention   Complete by:  As directed    Drink plenty of fluids.  Prune juice may be helpful.  You may use a stool softener, such as Colace (over the counter) 100 mg twice a day.  Use MiraLax (over the counter) for constipation as needed.   Constipation Prevention   Complete by:  As directed    Drink plenty of fluids.  Prune juice may be helpful.  You may use a stool softener, such as Colace (over the counter) 100 mg twice a day.  Use MiraLax (over the counter) for constipation as needed.   Diet - low sodium  heart healthy   Complete by:  As directed    Diet - low sodium heart healthy   Complete by:  As directed    Diet - low sodium heart healthy   Complete by:  As directed    Increase activity slowly as tolerated   Complete by:  As directed    Increase activity slowly as tolerated   Complete by:  As directed    Increase activity slowly as tolerated   Complete by:  As directed       Allergies as of 01/20/2018      Reactions   Dust Mite Extract Other (See Comments)   Nasal Congestion   Other Other (See Comments)   Pet Dander- Runny nose      Medication List    STOP taking these medications   naproxen sodium 220 MG tablet Commonly known as:  ALEVE     TAKE these medications   atorvastatin 10 MG tablet Commonly known as:  LIPITOR TAKE 1 TABLET BY MOUTH EVERY DAY   diltiazem 120 MG 24 hr capsule Commonly known as:  CARDIZEM CD Take 1 capsule (120 mg total) by mouth daily.   fexofenadine 180 MG tablet Commonly known as:  ALLEGRA Take 180 mg by mouth daily.   FIBER-CAPS PO Take 2 capsules by mouth daily.   fluticasone 50 MCG/ACT nasal spray Commonly known as:  FLONASE Place 2 sprays into the nose daily. For allergies.   methocarbamol 500 MG tablet Commonly known as:  ROBAXIN Take 1 tablet (500 mg total) by mouth 3 (three) times daily as needed.   oxyCODONE 5 MG immediate release tablet Commonly known as:  ROXICODONE Take 1 tablet (5 mg total) by mouth every 4 (four) hours as needed for severe pain.   ranitidine 150 MG tablet Commonly known as:  ZANTAC Take 150 mg by mouth daily as needed for heartburn.   rivaroxaban 20 MG Tabs tablet Commonly known as:  XARELTO Take 1 tablet (20 mg total) by mouth daily with supper.         Signed: Verlee Rossetti 01/20/2018, 7:42 AM  [facility] is now [employer] [address], [place], [place] [address] Phone: [phone] Facebook  Instagram  [employer]

## 2018-01-20 NOTE — Progress Notes (Signed)
Orthopedics Progress Note  Subjective: Patient feeling better this morning. Denies SOB or chest pain  Objective:  Vitals:   01/19/18 2320 01/20/18 0621  BP: 113/68 119/76  Pulse: 76 70  Resp: 18 17  Temp: 98.5 F (36.9 C) 98.7 F (37.1 C)  SpO2: 93% 100%    General: Awake and alert  Heart regular this AM Musculoskeletal: Right knee dressing CDI, no cords, no calf swelling Neurovascularly intact  Lab Results  Component Value Date   WBC 7.8 01/19/2018   HGB 12.5 (L) 01/19/2018   HCT 36.2 (L) 01/19/2018   MCV 88.9 01/19/2018   PLT 150 01/19/2018       Component Value Date/Time   NA 136 01/17/2018 0517   K 4.1 01/17/2018 0517   CL 100 (L) 01/17/2018 0517   CO2 27 01/17/2018 0517   GLUCOSE 124 (H) 01/17/2018 0517   GLUCOSE 104 (H) 10/23/2006 0848   BUN 10 01/17/2018 0517   CREATININE 1.00 01/17/2018 0517   CALCIUM 8.7 (L) 01/17/2018 0517   GFRNONAA >60 01/17/2018 0517   GFRAA >60 01/17/2018 0517    Lab Results  Component Value Date   INR 1.03 07/23/2012    Assessment/Plan: POD #4 s/p Procedure(s): RIGHT TOTAL KNEE ARTHROPLASTY New Onset AFib Back in regular sinus rhythm this morning D/C home once cleared by cardiology - appreciate assistance Follow up with me in two weeks  Almedia BallsSteven R. Ranell PatrickNorris, MD 01/20/2018 7:30 AM

## 2018-01-21 ENCOUNTER — Telehealth: Payer: Self-pay | Admitting: *Deleted

## 2018-01-21 DIAGNOSIS — M25661 Stiffness of right knee, not elsewhere classified: Secondary | ICD-10-CM | POA: Diagnosis not present

## 2018-01-21 NOTE — Telephone Encounter (Signed)
Pt was on TCM report ADMITTED 01/16/18 for Right knee primary OA, end stage. Pt had a RIGHT TOTAL KNEE ARTHROPLASTY.Patient had a hospital course complicated by A Fib with RVR requiring cardiology consult.  Patient started on Xarelto and Cardizem with good control of rate and improvement in symptoms. Pt D/C 01/20/18, and will f/u w/Dr. Ranell PatrickNorris in 2 weeks.Marland Kitchen.Raechel Chute/lmb

## 2018-01-26 DIAGNOSIS — M25661 Stiffness of right knee, not elsewhere classified: Secondary | ICD-10-CM | POA: Diagnosis not present

## 2018-01-28 DIAGNOSIS — Z4789 Encounter for other orthopedic aftercare: Secondary | ICD-10-CM | POA: Diagnosis not present

## 2018-01-28 DIAGNOSIS — M1711 Unilateral primary osteoarthritis, right knee: Secondary | ICD-10-CM | POA: Diagnosis not present

## 2018-01-29 ENCOUNTER — Ambulatory Visit (HOSPITAL_COMMUNITY): Payer: Medicare Other | Attending: Cardiology

## 2018-01-29 ENCOUNTER — Other Ambulatory Visit: Payer: Self-pay

## 2018-01-29 DIAGNOSIS — E785 Hyperlipidemia, unspecified: Secondary | ICD-10-CM | POA: Insufficient documentation

## 2018-01-29 DIAGNOSIS — F419 Anxiety disorder, unspecified: Secondary | ICD-10-CM | POA: Insufficient documentation

## 2018-01-29 DIAGNOSIS — I480 Paroxysmal atrial fibrillation: Secondary | ICD-10-CM | POA: Diagnosis not present

## 2018-01-29 DIAGNOSIS — M25661 Stiffness of right knee, not elsewhere classified: Secondary | ICD-10-CM | POA: Diagnosis not present

## 2018-01-29 DIAGNOSIS — R0602 Shortness of breath: Secondary | ICD-10-CM | POA: Insufficient documentation

## 2018-01-29 DIAGNOSIS — R079 Chest pain, unspecified: Secondary | ICD-10-CM | POA: Diagnosis not present

## 2018-01-29 DIAGNOSIS — I082 Rheumatic disorders of both aortic and tricuspid valves: Secondary | ICD-10-CM | POA: Insufficient documentation

## 2018-02-02 DIAGNOSIS — M25661 Stiffness of right knee, not elsewhere classified: Secondary | ICD-10-CM | POA: Diagnosis not present

## 2018-02-03 ENCOUNTER — Ambulatory Visit (INDEPENDENT_AMBULATORY_CARE_PROVIDER_SITE_OTHER): Payer: Medicare Other | Admitting: Cardiology

## 2018-02-03 ENCOUNTER — Encounter: Payer: Self-pay | Admitting: Cardiology

## 2018-02-03 VITALS — BP 130/88 | HR 77 | Ht 70.0 in | Wt 220.0 lb

## 2018-02-03 DIAGNOSIS — I4891 Unspecified atrial fibrillation: Secondary | ICD-10-CM

## 2018-02-03 NOTE — Patient Instructions (Signed)
Medication Instructions:  Your physician recommends that you continue on your current medications as directed. Please refer to the Current Medication list given to you today.  If you need a refill on your cardiac medications, please contact your pharmacy first.  Labwork: None ordered   Testing/Procedures: None ordered   Follow-Up: Your physician wants you to follow-up in: 6 months with Dr. Eldridge DaceVaranasi. You will receive a reminder letter in the mail two months in advance. If you don't receive a letter, please call our office to schedule the follow-up appointment.  Any Other Special Instructions Will Be Listed Below (If Applicable).   Thank you for choosing [facility]    [phone]  If you need a refill on your cardiac medications before your next appointment, please call your pharmacy.

## 2018-02-03 NOTE — Progress Notes (Signed)
02/03/2018 Todd Paceichard K Vinluan Jr.   09/16/1951  [id number]  Primary Physician Jonny RuizJohn, Len BlalockJames W, MD Primary Cardiologist: Dr. Eldridge DaceVaranasi   Reason for Visit/CC: [facility] for New onset Atrial Fibrillation.  HPI:  Todd PaceRichard K Avedisian Jr. is a 67 y.o. male who is being seen today for post hospital f/u. He was recently admitted for elective TKR for OA. Post-op, while working with physical therapy, he was noted to be tachycardic with rates in the 120s. EKG was obtained and showed new onset atrial fibrillation. Pt was asymptomatic. He was seen by Dr. Eldridge DaceVaranasi in consultation who recommended rate control with Cardizem + a/c for stroke prophylaxis. He was placed on Xarelto. TSH and electrolytes normal. Dr. Abe PeopleVaransi recommended outpatient echo and consideration for outpatient DCCV after 3 weeks of anticoagulation, if still in afib. However pt reports that he went back into NSR prior to his discharge. His other PMH includes HLD, GERD and gout. Prior to knee replacement, he was fairly active and denied any exertional CP or dyspnea.   Today in f/u, pt reports that he has been doing well. He is ambulating with a cane and progressing with physical therapy. EKG today shows NSR. HR 65 bpm. Pt had his echo done on 01/29/18 which showed normal LVEF, 55-60%, G1DD and mild AI. The LA was moderately dilated. He reports that he self discontinued his Diltiazem and Xarelto last week. His orthopedist gave him the ok to stop Xarelto from a DVT prophylaxis standpoint. He denies any palpitations, chest pain or dyspnea.    No outpatient medications have been marked as taking for the 02/03/18 encounter (Office Visit) with Allayne ButcherSimmons, Arvell Pulsifer M, PA-C.   Allergies  Allergen Reactions  . Dust Mite Extract Other (See Comments)    Nasal Congestion  . Other Other (See Comments)    Pet Dander- Runny nose   Past Medical History:  Diagnosis Date  . ALLERGIC RHINITIS 01/08/2010  . Allergy    dust mites, pet dander  . ANEMIA-NOS  01/08/2010  . ANXIETY 01/08/2010  . Atrial fibrillation with RVR (HCC) 01/19/2018  . CERVICAL RADICULOPATHY, RIGHT 01/08/2010  . CHEST PAIN 01/08/2010  . DIVERTICULOSIS, COLON 01/08/2010  . GERD (gastroesophageal reflux disease)    Zantac OTC  prn  . GOUT 01/08/2010   in past/   . HYPERLIPIDEMIA 01/08/2010  . Lumbar disc disease 08/05/2011  . Osteoarthritis of right knee    end stage  . 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 Shortness of breath    with exertion   Family History  Problem Relation Age of Onset  . Bradycardia Father 60       Pacemaker  . Lymphoma Mother   . Alzheimer's disease Mother   . Pancreatic cancer Brother   . Colon cancer Neg Hx    Past Surgical History:  Procedure Laterality Date  . ANTERIOR CERVICAL DECOMP/DISCECTOMY FUSION  07/23/2012   Procedure: ANTERIOR CERVICAL DECOMPRESSION/DISCECTOMY FUSION 2 LEVELS;  Surgeon: Clydene FakeJames R Hirsch, MD;  Location: [facility];  Service: Neurosurgery;  Laterality: N/A;  Cervical three-four, four-five, redo Cervical six-seven anterior cervical decompression, discectomy fusion with allograft and plating  . BACK SURGERY  03/2004, 02/2005  . CERVICAL SPINE SURGERY  05/2010  . clavicle facture  nov. 2009   100% displacement  . COLONOSCOPY    . HERNIA REPAIR     inguinal hernia/right side/ 67 years old  . JOINT REPLACEMENT     Left knee 2006  . KNEE ARTHROSCOPY  total of 4  . left knee arthroscopy  september 2005  . lumbar disc surgury  02/2002  . lumbar disc surgury  june 2004  . NASAL RECONSTRUCTION     x 3  . s/p ENT surgury     x 3  . s/p left shoulder surgury  feb. 2009  . s/p left TKR  april 2006  . TOTAL KNEE ARTHROPLASTY Right 01/16/2018   Procedure: RIGHT TOTAL KNEE ARTHROPLASTY;  Surgeon: Beverely Low, MD;  [facility];  Service: Orthopedics;  Laterality: Right;   Social History   Socioeconomic History  . Marital status: Married    Spouse name: Not on file  . Number of children: Not on file  .  Years of education: Not on file  . Highest education level: Not on file  Social Needs  . Financial resource strain: Not on file  . Food insecurity - worry: Not on file  . Food insecurity - inability: Not on file  . Transportation needs - medical: Not on file  . Transportation needs - non-medical: Not on file  Occupational History  . 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 Smoking status: Never Smoker  . Smokeless tobacco: Never Used  Substance and Sexual Activity  . Alcohol use: Yes    Alcohol/week: 7.2 oz    Types: 12 Cans of beer per week    Comment: 3-4 days a week  . Drug use: No  . Sexual activity: Not on file  Other Topics Concern  . Not on file  Social History Narrative  . Not on file     Review of Systems: General: negative for chills, fever, night sweats or weight changes.  Cardiovascular: negative for chest pain, dyspnea on exertion, edema, orthopnea, palpitations, paroxysmal nocturnal dyspnea or shortness of breath Dermatological: negative for rash Respiratory: negative for cough or wheezing Urologic: negative for hematuria Abdominal: negative for nausea, vomiting, diarrhea, bright red blood per rectum, melena, or hematemesis Neurologic: negative for visual changes, syncope, or dizziness All other systems reviewed and are otherwise negative except as noted above.   Physical Exam:  Height 5\' 10"  (1.778 m), weight 220 lb (99.8 kg).  General appearance: alert, cooperative and no distress Neck: no carotid bruit and no JVD Lungs: clear to auscultation bilaterally Heart: regular rate and rhythm, S1, S2 normal, no murmur, click, rub or gallop Extremities: extremities normal, atraumatic, no cyanosis or edema Pulses: 2+ and symmetric Skin: Skin color, texture, turgor normal. No rashes or lesions Neurologic: Grossly normal  EKG NSR 65 bpm -- personally reviewed   ASSESSMENT AND PLAN:   1. Post Operative Atrial Fibrillation: newly diagnosed, following  knee surgery/ post operative pain. TSH and electrolytes WNL. 2D echo shows normal LVEF, G1DD, mild AI and moderately dilated LA. Cardizem was initiated for rate control and Xarelto added for stroke prophylaxis (CHA2DS2 VASc score is 1 for age 16-74). However pt reports he discontinued both meds post discharge. His orthopaedists felt ok to d/c Xarelto from a DVT prophylaxis standpoint. EKG today demonstrates NSR.  HR w/o medication is in the 60s. He denies any breakthrough symptoms. Very active 67 y/o and eager to get back to his physical training. He has never had any anginal symptoms with exertion.   2. Knee OA: s/p TKR.    Follow-Up: we will have him f/u again with Dr. Eldridge Dace in 6 months. If he is doing well at that visit and no recurrent afib, may be able to change to  PRN f/u.   Haizlee Henton Delmer Islam, MHS [facility] 02/03/2018 11:35 AM

## 2018-02-05 DIAGNOSIS — M25661 Stiffness of right knee, not elsewhere classified: Secondary | ICD-10-CM | POA: Diagnosis not present

## 2018-02-09 DIAGNOSIS — M25661 Stiffness of right knee, not elsewhere classified: Secondary | ICD-10-CM | POA: Diagnosis not present

## 2018-02-12 DIAGNOSIS — M25661 Stiffness of right knee, not elsewhere classified: Secondary | ICD-10-CM | POA: Diagnosis not present

## 2018-02-16 DIAGNOSIS — M25661 Stiffness of right knee, not elsewhere classified: Secondary | ICD-10-CM | POA: Diagnosis not present

## 2018-02-19 DIAGNOSIS — M25661 Stiffness of right knee, not elsewhere classified: Secondary | ICD-10-CM | POA: Diagnosis not present

## 2018-02-23 DIAGNOSIS — M25661 Stiffness of right knee, not elsewhere classified: Secondary | ICD-10-CM | POA: Diagnosis not present

## 2018-03-02 DIAGNOSIS — M24661 Ankylosis, right knee: Secondary | ICD-10-CM | POA: Diagnosis not present

## 2018-03-02 DIAGNOSIS — G8918 Other acute postprocedural pain: Secondary | ICD-10-CM | POA: Diagnosis not present

## 2018-03-02 DIAGNOSIS — Z96651 Presence of right artificial knee joint: Secondary | ICD-10-CM | POA: Diagnosis not present

## 2018-03-03 DIAGNOSIS — M25661 Stiffness of right knee, not elsewhere classified: Secondary | ICD-10-CM | POA: Diagnosis not present

## 2018-03-04 DIAGNOSIS — M25661 Stiffness of right knee, not elsewhere classified: Secondary | ICD-10-CM | POA: Diagnosis not present

## 2018-03-05 DIAGNOSIS — M25661 Stiffness of right knee, not elsewhere classified: Secondary | ICD-10-CM | POA: Diagnosis not present

## 2018-03-06 DIAGNOSIS — M25661 Stiffness of right knee, not elsewhere classified: Secondary | ICD-10-CM | POA: Diagnosis not present

## 2018-03-09 DIAGNOSIS — M25661 Stiffness of right knee, not elsewhere classified: Secondary | ICD-10-CM | POA: Diagnosis not present

## 2018-03-11 DIAGNOSIS — M25661 Stiffness of right knee, not elsewhere classified: Secondary | ICD-10-CM | POA: Diagnosis not present

## 2018-03-12 DIAGNOSIS — M25661 Stiffness of right knee, not elsewhere classified: Secondary | ICD-10-CM | POA: Diagnosis not present

## 2018-03-16 DIAGNOSIS — M25661 Stiffness of right knee, not elsewhere classified: Secondary | ICD-10-CM | POA: Diagnosis not present

## 2018-03-18 DIAGNOSIS — M25661 Stiffness of right knee, not elsewhere classified: Secondary | ICD-10-CM | POA: Diagnosis not present

## 2018-05-04 DIAGNOSIS — M25512 Pain in left shoulder: Secondary | ICD-10-CM | POA: Diagnosis not present

## 2018-05-04 DIAGNOSIS — M25519 Pain in unspecified shoulder: Secondary | ICD-10-CM | POA: Insufficient documentation

## 2018-05-07 DIAGNOSIS — M25519 Pain in unspecified shoulder: Secondary | ICD-10-CM | POA: Diagnosis not present

## 2018-05-11 DIAGNOSIS — M25512 Pain in left shoulder: Secondary | ICD-10-CM | POA: Diagnosis not present

## 2018-06-03 DIAGNOSIS — M25661 Stiffness of right knee, not elsewhere classified: Secondary | ICD-10-CM | POA: Diagnosis not present

## 2018-06-03 DIAGNOSIS — Z4789 Encounter for other orthopedic aftercare: Secondary | ICD-10-CM | POA: Diagnosis not present

## 2018-06-24 DIAGNOSIS — M75102 Unspecified rotator cuff tear or rupture of left shoulder, not specified as traumatic: Secondary | ICD-10-CM | POA: Diagnosis not present

## 2018-06-24 DIAGNOSIS — M25512 Pain in left shoulder: Secondary | ICD-10-CM | POA: Diagnosis not present

## 2018-07-20 NOTE — Pre-Procedure Instructions (Signed)
Todd Barbaraann FasterK Handrich Jr.  07/20/2018      [id number] [place]- [place], [place] - [address] [place] [id number] Phone: [phone] Fax: [phone]    Your procedure is scheduled on September 5  [facility].M.  Call this number if you have problems the morning of surgery:  [phone]   Remember:  Do not eat or drink after midnight.     Take these medicines the morning of surgery with A SIP OF WATER  fexofenadine (ALLEGRA)  fluticasone (FLONASE) methocarbamol (ROBAXIN) ranitidine (ZANTAC)  7 days prior to surgery STOP taking any Aspirin(unless otherwise instructed by your surgeon), Aleve, Naproxen, Ibuprofen, Motrin, Advil, Goody's, BC's, all herbal medications, fish oil, and all vitamins     Do not wear jewelry  Do not wear lotions, powders, or cologne, or deodorant.   Men may shave face and neck.  Do not bring valuables to the hospital.  [facility] is not responsible for any belongings or valuables.  Contacts, dentures or bridgework may not be worn into surgery.  Leave your suitcase in the car.  After surgery it may be brought to your room.  For patients admitted to the hospital, discharge time will be determined by your treatment team.  Patients discharged the day of surgery will not be allowed to drive home.    Special instructions:   [facility]- Preparing For Surgery  Before surgery, you can play an important role. Because skin is not sterile, your skin needs to be as free of germs as possible. You can reduce the number of germs on your skin by washing with CHG (chlorahexidine gluconate) Soap before surgery.  CHG is an antiseptic cleaner which kills germs and bonds with the skin to continue killing germs even after washing.    Oral Hygiene is also important to reduce your risk of infection.  Remember - BRUSH YOUR TEETH THE MORNING OF SURGERY WITH YOUR REGULAR TOOTHPASTE  Please do not use if  you have an allergy to CHG or antibacterial soaps. If your skin becomes reddened/irritated stop using the CHG.  Do not shave (including legs and underarms) for at least 48 hours prior to first CHG shower. It is OK to shave your face.  Please follow these instructions carefully.   1. Shower the NIGHT BEFORE SURGERY and the MORNING OF SURGERY with CHG.   2. If you chose to wash your hair, wash your hair first as usual with your normal shampoo.  3. After you shampoo, rinse your hair and body thoroughly to remove the shampoo.  4. Use CHG as you would any other liquid soap. You can apply CHG directly to the skin and wash gently with a scrungie or a clean washcloth.   5. Apply the CHG Soap to your body ONLY FROM THE NECK DOWN.  Do not use on open wounds or open sores. Avoid contact with your eyes, ears, mouth and genitals (private parts). Wash Face and genitals (private parts)  with your normal soap.  6. Wash thoroughly, paying special attention to the area where your surgery will be performed.  7. Thoroughly rinse your body with warm water from the neck down.  8. DO NOT shower/wash with your normal soap after using and rinsing off the CHG Soap.  9. Pat yourself dry with a CLEAN TOWEL.  10. Wear CLEAN PAJAMAS to bed the night before surgery, wear comfortable clothes the morning of surgery  11. Place  CLEAN SHEETS on your bed the night of your first shower and DO NOT SLEEP WITH PETS.    Day of Surgery:  Do not apply any deodorants/lotions.  Please wear clean clothes to the hospital/surgery center.   Remember to brush your teeth WITH YOUR REGULAR TOOTHPASTE.    Please read over the following fact sheets that you were given.

## 2018-07-21 ENCOUNTER — Encounter (HOSPITAL_COMMUNITY)
Admission: RE | Admit: 2018-07-21 | Discharge: 2018-07-21 | Disposition: A | Discharge status: Home / Self Care | Payer: Medicare Other | Source: Ambulatory Visit | Attending: Orthopedic Surgery | Admitting: Orthopedic Surgery

## 2018-07-21 ENCOUNTER — Other Ambulatory Visit: Payer: Self-pay

## 2018-07-21 ENCOUNTER — Encounter (HOSPITAL_COMMUNITY): Payer: Self-pay

## 2018-07-21 DIAGNOSIS — Z01812 Encounter for preprocedural laboratory examination: Secondary | ICD-10-CM | POA: Insufficient documentation

## 2018-07-21 LAB — BASIC METABOLIC PANEL
Anion gap: 9 (ref 5–15)
BUN: 17 mg/dL (ref 8–23)
CHLORIDE: 108 mmol/L (ref 98–111)
CO2: 23 mmol/L (ref 22–32)
CREATININE: 0.99 mg/dL (ref 0.61–1.24)
Calcium: 9.3 mg/dL (ref 8.9–10.3)
GFR calc Af Amer: >60 mL/min (ref >60)
GFR calc non Af Amer: >60 mL/min (ref >60)
GLUCOSE: 91 mg/dL (ref 70–99)
POTASSIUM: 4.5 mmol/L (ref 3.5–5.1)
Sodium: 140 mmol/L (ref 135–145)

## 2018-07-21 LAB — CBC
HEMATOCRIT: 42.1 % (ref 39.0–52.0)
Hemoglobin: 14.1 g/dL (ref 13.0–17.0)
MCH: 29.7 pg (ref 26.0–34.0)
MCHC: 33.5 g/dL (ref 30.0–36.0)
MCV: 88.8 fL (ref 78.0–100.0)
PLATELETS: 163 10*3/uL (ref 150–400)
RBC: 4.74 MIL/uL (ref 4.22–5.81)
RDW: 12.7 % (ref 11.5–15.5)
WBC: 5.1 10*3/uL (ref 4.0–10.5)

## 2018-07-21 LAB — SURGICAL PCR SCREEN
MRSA, PCR: NEGATIVE
Staphylococcus aureus: NEGATIVE

## 2018-07-22 NOTE — Progress Notes (Signed)
Anesthesia Chart Review:  Case:  [id number] Date/Time:  07/30/18 0945   Procedure:  LEFT REVERSE SHOULDER ARTHROPLASTY (Left ) -   Anesthesia type:  General   Pre-op diagnosis:  left shoulder rotator cuff tear arthropathy   [facility]:  [facility] ROOM 06 / [facility]   Surgeon:  Francena Hanly, MD      DISCUSSION: 67 yo male never smoker for above procedure. Pertinent hx includes Anxiety, HLD, GER, Postop Afib (After TKA 01/19/2018, in NSR per cardiology note 01/2018), Anemia, Gout.  Pt had Right TKA 01/20/2018 complicated by postop Afib with RVR. He was seen by Cardiology, started on Cardizem and Xarelto. He had outpatient Echo showing EF 55-60% with normal wall motion and grade 1 dd. He was last seen by Boyce Medici, PA-C 02/03/2018 and at that time he was in sinus rhythm. He had self discontinued both Cardizem and Xarelto. He was recommended 6 month f/u.  Cardiac clearance per telephone encounter 07/28/2018 by Manson Passey, PA-C: "Given past medical history and time since last visit, based on ACC/AHA guidelines, [name] would be at acceptable risk for the planned procedure without further cardiovascular testing. 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 Denies chest pain, dyspnea, palpitations, orthopnea, PND, syncope or melena. Easily getting > 4 Mets of activity per DASI. Prior hx of post op afib after knee surgery. He will be at risk of arrhythmias with any surgery."  Anticipate he can proceed as scheduled barring acute status change.  VS: BP 135/69   Pulse 64   Temp 36.4 C (Oral)   Resp 16   Ht 5\' 10"  (1.778 m)   Wt 104.5 kg   SpO2 97%   BMI 33.06 kg/m   PROVIDERS: Corwin Levins, MD is PCP  Lance Muss, MD is Cardiologist, last seen by Boyce Medici, PA-C 02/03/2018.  LABS: Labs reviewed: Acceptable for surgery. (all labs ordered are listed, but only abnormal results are displayed)  Labs Reviewed  SURGICAL PCR SCREEN  BASIC METABOLIC PANEL  CBC      IMAGES: N/A   EKG: 02/03/2018: Normal sinus rhythm  CV: TTE 01/29/2018: Study Conclusions  - Left ventricle: The cavity size was normal. There was mild   concentric hypertrophy. Systolic function was normal. The   estimated ejection fraction was in the range of 55% to 60%. Wall   motion was normal; there were no regional wall motion   abnormalities. Doppler parameters are consistent with abnormal   left ventricular relaxation (grade 1 diastolic dysfunction).   There was no evidence of elevated ventricular filling pressure by   Doppler parameters. - Aortic valve: Trileaflet; mildly thickened, mildly calcified   leaflets. Transvalvular velocity was within the normal range.   There was no stenosis. There was mild regurgitation. - Mitral valve: There was trivial regurgitation. - Left atrium: The atrium was moderately dilated. - Right ventricle: The cavity size was normal. Wall thickness was   normal. Systolic function was normal. - Right atrium: The atrium was normal in size. - Tricuspid valve: There was mild regurgitation. - Pulmonary arteries: Systolic pressure was within the normal   range. - Inferior vena cava: The vessel was normal in size. - Pericardium, extracardiac: There was no pericardial effusion.  Past Medical History:  Diagnosis Date  . ALLERGIC RHINITIS 01/08/2010  . Allergy    dust mites, pet dander  . ANEMIA-NOS 01/08/2010  . ANXIETY 01/08/2010  . Atrial fibrillation with RVR (HCC) 01/19/2018  . CERVICAL RADICULOPATHY, RIGHT 01/08/2010  .  CHEST PAIN 01/08/2010  . DIVERTICULOSIS, COLON 01/08/2010  . GERD (gastroesophageal reflux disease)    Zantac OTC  prn  . GOUT 01/08/2010   in past/   . HYPERLIPIDEMIA 01/08/2010  . Lumbar disc disease 08/05/2011  . Osteoarthritis of right knee    end stage  . 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 Shortness of breath    with exertion    Past Surgical History:  Procedure Laterality Date  . ANTERIOR CERVICAL  DECOMP/DISCECTOMY FUSION  07/23/2012   Procedure: ANTERIOR CERVICAL DECOMPRESSION/DISCECTOMY FUSION 2 LEVELS;  Surgeon: Clydene FakeJames R Hirsch, MD;  Location: [facility];  Service: Neurosurgery;  Laterality: N/A;  Cervical three-four, four-five, redo Cervical six-seven anterior cervical decompression, discectomy fusion with allograft and plating  . BACK SURGERY  03/2004, 02/2005  . CERVICAL SPINE SURGERY  05/2010  . clavicle facture  nov. 2009   100% displacement  . COLONOSCOPY    . HERNIA REPAIR     inguinal hernia/right side/ 67 years old  . JOINT REPLACEMENT     Left knee 2006  . KNEE ARTHROSCOPY     total of 4  . left knee arthroscopy  september 2005  . lumbar disc surgury  02/2002  . lumbar disc surgury  june 2004  . NASAL RECONSTRUCTION     x 3  . s/p ENT surgury     x 3  . s/p left shoulder surgury  feb. 2009  . s/p left TKR  april 2006  . TOTAL KNEE ARTHROPLASTY Right 01/16/2018   Procedure: RIGHT TOTAL KNEE ARTHROPLASTY;  Surgeon: Beverely LowNorris, Steve, MD;  [facility];  Service: Orthopedics;  Laterality: Right;    MEDICATIONS: . atorvastatin (LIPITOR) 10 MG tablet  . Calcium Polycarbophil (FIBER-CAPS PO)  . fexofenadine (ALLEGRA) 180 MG tablet  . fluticasone (FLONASE) 50 MCG/ACT nasal spray  . methocarbamol (ROBAXIN) 500 MG tablet  . ranitidine (ZANTAC) 150 MG tablet   No current facility-administered medications for this encounter.      Zannie CoveJames Lillie Bollig, PA-C [facility]/Anesthesiology Phone [phone] 07/28/2018 4:48 PM

## 2018-07-23 ENCOUNTER — Encounter: Payer: Self-pay | Admitting: *Deleted

## 2018-07-23 ENCOUNTER — Telehealth: Payer: Self-pay | Admitting: *Deleted

## 2018-07-23 NOTE — Telephone Encounter (Addendum)
Received surgical clearance re: pt from [facility]. A lot of information is incomplete.  Will put in Epic and wait for callback from Glendale Chard, Surgical Coordinator. Left message for her to call back?      [facility] Pre-operative Risk Assessment    Request for surgical clearance:  1. What type of surgery is being performed?  LEFT REVERSE SHOULDER ARTHROPLASTY   2. When is this surgery scheduled?  07/30/18   3. What type of clearance is required (medical clearance vs. Pharmacy clearance to hold med vs. Both)?  MEDICAL  4. Are there any medications that need to be held prior to surgery and how long?  N/A  5. Practice name and name of physician performing surgery? [facility] DR. [facility]   6. What is your office phone number [phone]    7.   What is your office fax number [phone]  8.   Anesthesia type (None, local, MAC, general) ? GENERAL   Jeanann Lewandowsky 07/23/2018, 11:25 AM  _________________________________________________________________   (provider comments below)

## 2018-07-23 NOTE — Telephone Encounter (Signed)
Patient of Dr. Eldridge DaceVaranasi, last seen 01/2018 by Boyce MediciBrittany Simmons, PA-C.  I left the patient a message asking him to contact us for cardiac clearance.  Theodore DemarkRhonda Barrett, PA-C 07/23/2018 5:16 PM Beeper [phone]

## 2018-07-28 NOTE — Telephone Encounter (Signed)
   Primary Cardiologist: Lance Muss, MD  Chart reviewed as part of pre-operative protocol coverage. Given past medical history and time since last visit, based on ACC/AHA guidelines, Todd Barrett. would be at acceptable risk for the planned procedure without further cardiovascular testing. 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 Denies chest pain, dyspnea, palpitations, orthopnea, PND, syncope or melena. Easily getting > 4 Mets of activity per DASI. Prior hx of post op afib after knee surgery. He will be at risk of arrhythmias with any surgery.   I will route this recommendation to the requesting party via Epic fax function and remove from pre-op pool.  Please call with questions.  [place], [place] 07/28/2018, 1:57 PM

## 2018-07-29 MED ORDER — TRANEXAMIC ACID 1000 MG/10ML IV SOLN
1000.0000 mg | INTRAVENOUS | Status: AC
  Administered 2018-07-30: 1000 mg via INTRAVENOUS
  Filled 2018-07-29: qty 1000

## 2018-07-30 ENCOUNTER — Encounter (HOSPITAL_COMMUNITY)
Admission: RE | Disposition: A | Discharge status: Home / Self Care | Payer: Self-pay | Source: Home / Self Care | Attending: Orthopedic Surgery

## 2018-07-30 ENCOUNTER — Inpatient Hospital Stay (HOSPITAL_COMMUNITY)
Admission: RE | Admit: 2018-07-30 | Discharge: 2018-07-31 | DRG: 483 | Disposition: A | Discharge status: Home / Self Care | Payer: Medicare Other | Attending: Orthopedic Surgery | Admitting: Orthopedic Surgery

## 2018-07-30 ENCOUNTER — Inpatient Hospital Stay (HOSPITAL_COMMUNITY): Payer: Medicare Other | Admitting: Certified Registered"

## 2018-07-30 ENCOUNTER — Inpatient Hospital Stay (HOSPITAL_COMMUNITY): Payer: Medicare Other | Admitting: Physician Assistant

## 2018-07-30 ENCOUNTER — Encounter (HOSPITAL_COMMUNITY): Payer: Self-pay | Admitting: *Deleted

## 2018-07-30 ENCOUNTER — Other Ambulatory Visit: Payer: Self-pay

## 2018-07-30 DIAGNOSIS — M75102 Unspecified rotator cuff tear or rupture of left shoulder, not specified as traumatic: Principal | ICD-10-CM | POA: Diagnosis present

## 2018-07-30 DIAGNOSIS — G8929 Other chronic pain: Secondary | ICD-10-CM | POA: Diagnosis not present

## 2018-07-30 DIAGNOSIS — M12812 Other specific arthropathies, not elsewhere classified, left shoulder: Secondary | ICD-10-CM | POA: Diagnosis not present

## 2018-07-30 DIAGNOSIS — Z79899 Other long term (current) drug therapy: Secondary | ICD-10-CM

## 2018-07-30 DIAGNOSIS — Z23 Encounter for immunization: Secondary | ICD-10-CM | POA: Diagnosis not present

## 2018-07-30 DIAGNOSIS — G8918 Other acute postprocedural pain: Secondary | ICD-10-CM | POA: Diagnosis not present

## 2018-07-30 DIAGNOSIS — Z981 Arthrodesis status: Secondary | ICD-10-CM | POA: Diagnosis not present

## 2018-07-30 DIAGNOSIS — Z96619 Presence of unspecified artificial shoulder joint: Secondary | ICD-10-CM

## 2018-07-30 DIAGNOSIS — M75122 Complete rotator cuff tear or rupture of left shoulder, not specified as traumatic: Secondary | ICD-10-CM | POA: Diagnosis not present

## 2018-07-30 HISTORY — PX: REVERSE SHOULDER ARTHROPLASTY: SHX5054

## 2018-07-30 SURGERY — ARTHROPLASTY, SHOULDER, TOTAL, REVERSE
Anesthesia: General | Site: Shoulder | Laterality: Left

## 2018-07-30 MED ORDER — EPHEDRINE SULFATE-NACL 50-0.9 MG/10ML-% IV SOSY
PREFILLED_SYRINGE | INTRAVENOUS | Status: DC | PRN
  Administered 2018-07-30 (×2): 5 mg via INTRAVENOUS

## 2018-07-30 MED ORDER — METHOCARBAMOL 500 MG PO TABS: 500.0000 mg | ORAL_TABLET | Freq: Four times a day (QID) | ORAL | Status: DC | PRN

## 2018-07-30 MED ORDER — OXYCODONE HCL 5 MG PO TABS
5.0000 mg | ORAL_TABLET | ORAL | Status: DC | PRN
  Administered 2018-07-31: 10 mg via ORAL
  Filled 2018-07-30 (×2): qty 2

## 2018-07-30 MED ORDER — KETOROLAC TROMETHAMINE 15 MG/ML IJ SOLN
7.5000 mg | Freq: Four times a day (QID) | INTRAMUSCULAR | Status: AC
  Administered 2018-07-30 – 2018-07-31 (×5): 7.5 mg via INTRAVENOUS
  Filled 2018-07-30 (×2): qty 1

## 2018-07-30 MED ORDER — ROCURONIUM BROMIDE 50 MG/5ML IV SOSY
PREFILLED_SYRINGE | INTRAVENOUS | Status: AC
  Filled 2018-07-30: qty 5

## 2018-07-30 MED ORDER — ONDANSETRON HCL 4 MG/2ML IJ SOLN: 4.0000 mg | Freq: Four times a day (QID) | INTRAMUSCULAR | Status: DC | PRN

## 2018-07-30 MED ORDER — LACTATED RINGERS IV SOLN
INTRAVENOUS | Status: DC
  Administered 2018-07-30 (×2): via INTRAVENOUS

## 2018-07-30 MED ORDER — MAGNESIUM CITRATE PO SOLN: 1.0000 | Freq: Once | ORAL | Status: DC | PRN

## 2018-07-30 MED ORDER — SODIUM CHLORIDE 0.9 % IV SOLN
INTRAVENOUS | Status: DC | PRN
  Administered 2018-07-30: 20 ug/min via INTRAVENOUS

## 2018-07-30 MED ORDER — DEXAMETHASONE SODIUM PHOSPHATE 10 MG/ML IJ SOLN
INTRAMUSCULAR | Status: DC | PRN
  Administered 2018-07-30: 10 mg via INTRAVENOUS

## 2018-07-30 MED ORDER — DOCUSATE SODIUM 100 MG PO CAPS
100.0000 mg | ORAL_CAPSULE | Freq: Two times a day (BID) | ORAL | Status: DC
  Administered 2018-07-30 – 2018-07-31 (×2): 100 mg via ORAL
  Filled 2018-07-30 (×2): qty 1

## 2018-07-30 MED ORDER — MIDAZOLAM HCL 2 MG/2ML IJ SOLN
INTRAMUSCULAR | Status: AC
  Administered 2018-07-30: 2 mg
  Filled 2018-07-30: qty 2

## 2018-07-30 MED ORDER — OXYCODONE HCL 5 MG PO TABS
10.0000 mg | ORAL_TABLET | ORAL | Status: DC | PRN
  Administered 2018-07-30: 10 mg via ORAL

## 2018-07-30 MED ORDER — ONDANSETRON HCL 4 MG/2ML IJ SOLN: 4.0000 mg | Freq: Once | INTRAMUSCULAR | Status: DC | PRN

## 2018-07-30 MED ORDER — BUPIVACAINE HCL (PF) 0.5 % IJ SOLN
INTRAMUSCULAR | Status: DC | PRN
  Administered 2018-07-30: 15 mL

## 2018-07-30 MED ORDER — MENTHOL 3 MG MT LOZG: 1.0000 | LOZENGE | OROMUCOSAL | Status: DC | PRN

## 2018-07-30 MED ORDER — BISACODYL 5 MG PO TBEC: 5.0000 mg | DELAYED_RELEASE_TABLET | Freq: Every day | ORAL | Status: DC | PRN

## 2018-07-30 MED ORDER — PHENOL 1.4 % MT LIQD: 1.0000 | OROMUCOSAL | Status: DC | PRN

## 2018-07-30 MED ORDER — LIDOCAINE 2% (20 MG/ML) 5 ML SYRINGE
INTRAMUSCULAR | Status: DC | PRN
  Administered 2018-07-30: 60 mg via INTRAVENOUS

## 2018-07-30 MED ORDER — METOCLOPRAMIDE HCL 5 MG PO TABS: 5.0000 mg | ORAL_TABLET | Freq: Three times a day (TID) | ORAL | Status: DC | PRN

## 2018-07-30 MED ORDER — BUPIVACAINE LIPOSOME 1.3 % IJ SUSP
INTRAMUSCULAR | Status: DC | PRN
  Administered 2018-07-30: 10 mL

## 2018-07-30 MED ORDER — METHOCARBAMOL 1000 MG/10ML IJ SOLN
500.0000 mg | Freq: Four times a day (QID) | INTRAVENOUS | Status: DC | PRN
  Filled 2018-07-30: qty 5

## 2018-07-30 MED ORDER — SUGAMMADEX SODIUM 200 MG/2ML IV SOLN
INTRAVENOUS | Status: DC | PRN
  Administered 2018-07-30: 209 mg via INTRAVENOUS

## 2018-07-30 MED ORDER — ACETAMINOPHEN 325 MG PO TABS: 325.0000 mg | ORAL_TABLET | Freq: Four times a day (QID) | ORAL | Status: DC | PRN

## 2018-07-30 MED ORDER — DIPHENHYDRAMINE HCL 12.5 MG/5ML PO ELIX: 12.5000 mg | ORAL_SOLUTION | ORAL | Status: DC | PRN

## 2018-07-30 MED ORDER — ROCURONIUM BROMIDE 100 MG/10ML IV SOLN
INTRAVENOUS | Status: DC | PRN
  Administered 2018-07-30: 50 mg via INTRAVENOUS

## 2018-07-30 MED ORDER — OXYCODONE HCL 5 MG/5ML PO SOLN: 5.0000 mg | Freq: Once | ORAL | Status: DC | PRN

## 2018-07-30 MED ORDER — CHLORHEXIDINE GLUCONATE 4 % EX LIQD: 60.0000 mL | Freq: Once | CUTANEOUS | Status: DC

## 2018-07-30 MED ORDER — PANTOPRAZOLE SODIUM 40 MG PO TBEC
40.0000 mg | DELAYED_RELEASE_TABLET | Freq: Every day | ORAL | Status: DC
  Administered 2018-07-31: 40 mg via ORAL
  Filled 2018-07-30: qty 1

## 2018-07-30 MED ORDER — POLYETHYLENE GLYCOL 3350 17 G PO PACK: 17.0000 g | PACK | Freq: Every day | ORAL | Status: DC | PRN

## 2018-07-30 MED ORDER — DEXAMETHASONE SODIUM PHOSPHATE 10 MG/ML IJ SOLN
INTRAMUSCULAR | Status: AC
  Filled 2018-07-30: qty 1

## 2018-07-30 MED ORDER — ALUM & MAG HYDROXIDE-SIMETH 200-200-20 MG/5ML PO SUSP: 30.0000 mL | ORAL | Status: DC | PRN

## 2018-07-30 MED ORDER — MIDAZOLAM HCL 2 MG/2ML IJ SOLN
INTRAMUSCULAR | Status: AC
  Filled 2018-07-30: qty 2

## 2018-07-30 MED ORDER — INFLUENZA VAC SPLIT HIGH-DOSE 0.5 ML IM SUSY
0.5000 mL | PREFILLED_SYRINGE | INTRAMUSCULAR | Status: AC
  Administered 2018-07-31: 0.5 mL via INTRAMUSCULAR
  Filled 2018-07-30: qty 0.5

## 2018-07-30 MED ORDER — OXYCODONE HCL 5 MG PO TABS: 5.0000 mg | ORAL_TABLET | Freq: Once | ORAL | Status: DC | PRN

## 2018-07-30 MED ORDER — DEXTROSE 5 % IV SOLN: 3.0000 g | INTRAVENOUS | Status: DC

## 2018-07-30 MED ORDER — FENTANYL CITRATE (PF) 100 MCG/2ML IJ SOLN
INTRAMUSCULAR | Status: DC | PRN
  Administered 2018-07-30: 50 ug via INTRAVENOUS
  Administered 2018-07-30: 100 ug via INTRAVENOUS

## 2018-07-30 MED ORDER — CEFAZOLIN SODIUM-DEXTROSE 2-4 GM/100ML-% IV SOLN
2.0000 g | INTRAVENOUS | Status: AC
  Administered 2018-07-30: 2 g via INTRAVENOUS
  Filled 2018-07-30: qty 100

## 2018-07-30 MED ORDER — ONDANSETRON HCL 4 MG/2ML IJ SOLN
INTRAMUSCULAR | Status: DC | PRN
  Administered 2018-07-30: 4 mg via INTRAVENOUS

## 2018-07-30 MED ORDER — METOCLOPRAMIDE HCL 5 MG/ML IJ SOLN: 5.0000 mg | Freq: Three times a day (TID) | INTRAMUSCULAR | Status: DC | PRN

## 2018-07-30 MED ORDER — PROPOFOL 10 MG/ML IV BOLUS
INTRAVENOUS | Status: AC
  Filled 2018-07-30: qty 20

## 2018-07-30 MED ORDER — 0.9 % SODIUM CHLORIDE (POUR BTL) OPTIME
TOPICAL | Status: DC | PRN
  Administered 2018-07-30: 1000 mL

## 2018-07-30 MED ORDER — FENTANYL CITRATE (PF) 250 MCG/5ML IJ SOLN
INTRAMUSCULAR | Status: AC
  Filled 2018-07-30: qty 5

## 2018-07-30 MED ORDER — LIDOCAINE 2% (20 MG/ML) 5 ML SYRINGE
INTRAMUSCULAR | Status: AC
  Filled 2018-07-30: qty 5

## 2018-07-30 MED ORDER — FENTANYL CITRATE (PF) 100 MCG/2ML IJ SOLN: 25.0000 ug | INTRAMUSCULAR | Status: DC | PRN

## 2018-07-30 MED ORDER — PROPOFOL 10 MG/ML IV BOLUS
INTRAVENOUS | Status: DC | PRN
  Administered 2018-07-30: 200 mg via INTRAVENOUS

## 2018-07-30 MED ORDER — KETOROLAC TROMETHAMINE 15 MG/ML IJ SOLN
INTRAMUSCULAR | Status: AC
  Administered 2018-07-30: 7.5 mg via INTRAVENOUS
  Filled 2018-07-30: qty 1

## 2018-07-30 MED ORDER — LACTATED RINGERS IV SOLN
INTRAVENOUS | Status: DC
  Administered 2018-07-30: 15:00:00 via INTRAVENOUS

## 2018-07-30 MED ORDER — ONDANSETRON HCL 4 MG PO TABS: 4.0000 mg | ORAL_TABLET | Freq: Four times a day (QID) | ORAL | Status: DC | PRN

## 2018-07-30 MED ORDER — ONDANSETRON HCL 4 MG/2ML IJ SOLN
INTRAMUSCULAR | Status: AC
  Filled 2018-07-30: qty 2

## 2018-07-30 MED ORDER — FENTANYL CITRATE (PF) 100 MCG/2ML IJ SOLN
INTRAMUSCULAR | Status: AC
  Administered 2018-07-30: 50 ug
  Filled 2018-07-30: qty 2

## 2018-07-30 MED ORDER — HYDROMORPHONE HCL 1 MG/ML IJ SOLN: 0.5000 mg | INTRAMUSCULAR | Status: DC | PRN

## 2018-07-30 SURGICAL SUPPLY — 56 items
BASEPLATE GLENOID SHLDR SM (Shoulder) ×3 IMPLANT
BLADE SAW SGTL 83.5X18.5 (BLADE) ×3 IMPLANT
COVER SURGICAL LIGHT HANDLE (MISCELLANEOUS) ×3 IMPLANT
CUP SUT UNIV REVERS 39 NEU (Shoulder) ×3 IMPLANT
DERMABOND ADHESIVE PROPEN (GAUZE/BANDAGES/DRESSINGS) ×2
DERMABOND ADVANCED (GAUZE/BANDAGES/DRESSINGS) ×2
DERMABOND ADVANCED .7 DNX12 (GAUZE/BANDAGES/DRESSINGS) ×1 IMPLANT
DERMABOND ADVANCED .7 DNX6 (GAUZE/BANDAGES/DRESSINGS) ×1 IMPLANT
DRAPE ORTHO SPLIT 77X108 STRL (DRAPES) ×4
DRAPE SURG 17X11 SM STRL (DRAPES) ×3 IMPLANT
DRAPE SURG ORHT 6 SPLT 77X108 (DRAPES) ×2 IMPLANT
DRAPE U-SHAPE 47X51 STRL (DRAPES) ×3 IMPLANT
DRSG AQUACEL AG ADV 3.5X10 (GAUZE/BANDAGES/DRESSINGS) ×3 IMPLANT
DURAPREP 26ML APPLICATOR (WOUND CARE) ×3 IMPLANT
ELECT BLADE 4.0 EZ CLEAN MEGAD (MISCELLANEOUS) ×3
ELECT CAUTERY BLADE 6.4 (BLADE) ×3 IMPLANT
ELECT REM PT RETURN 9FT ADLT (ELECTROSURGICAL) ×3
ELECTRODE BLDE 4.0 EZ CLN MEGD (MISCELLANEOUS) ×1 IMPLANT
ELECTRODE REM PT RTRN 9FT ADLT (ELECTROSURGICAL) ×1 IMPLANT
FACESHIELD WRAPAROUND (MASK) ×12 IMPLANT
GLENOSPHERE LAT 39+4 SHOULDER (Shoulder) ×3 IMPLANT
GLOVE BIO SURGEON STRL SZ7.5 (GLOVE) ×3 IMPLANT
GLOVE BIO SURGEON STRL SZ8 (GLOVE) ×3 IMPLANT
GLOVE EUDERMIC 7 POWDERFREE (GLOVE) ×3 IMPLANT
GLOVE SS BIOGEL STRL SZ 7.5 (GLOVE) ×1 IMPLANT
GLOVE SUPERSENSE BIOGEL SZ 7.5 (GLOVE) ×2
GLOVE SURG SS PI 6.0 STRL IVOR (GLOVE) ×3 IMPLANT
GOWN STRL REUS W/ TWL LRG LVL3 (GOWN DISPOSABLE) ×1 IMPLANT
GOWN STRL REUS W/ TWL XL LVL3 (GOWN DISPOSABLE) ×2 IMPLANT
GOWN STRL REUS W/TWL LRG LVL3 (GOWN DISPOSABLE) ×2
GOWN STRL REUS W/TWL XL LVL3 (GOWN DISPOSABLE) ×4
INSERT HUMERAL 39/+6 (Insert) ×3 IMPLANT
KIT BASIN OR (CUSTOM PROCEDURE TRAY) ×3 IMPLANT
KIT TURNOVER KIT B (KITS) ×3 IMPLANT
MANIFOLD NEPTUNE II (INSTRUMENTS) ×3 IMPLANT
NEEDLE TAPERED W/ NITINOL LOOP (MISCELLANEOUS) ×3 IMPLANT
NS IRRIG 1000ML POUR BTL (IV SOLUTION) ×3 IMPLANT
PACK SHOULDER (CUSTOM PROCEDURE TRAY) ×3 IMPLANT
PAD ARMBOARD 7.5X6 YLW CONV (MISCELLANEOUS) ×9 IMPLANT
RESTRAINT HEAD UNIVERSAL NS (MISCELLANEOUS) ×3 IMPLANT
SCREW CENTRAL NONLOCK 25MM (Screw) ×3 IMPLANT
SCREW LOCK PERIPHERAL 30MM (Shoulder) ×3 IMPLANT
SCREW LOCK PERIPHERAL 36MM (Screw) ×3 IMPLANT
SET PIN UNIVERSAL REVERSE (SET/KITS/TRAYS/PACK) ×3 IMPLANT
SLING ARM FOAM STRAP LRG (SOFTGOODS) ×6 IMPLANT
SPONGE LAP 18X18 X RAY DECT (DISPOSABLE) ×3 IMPLANT
SPONGE LAP 4X18 RFD (DISPOSABLE) ×3 IMPLANT
STEM HUMERAL UNI REVERSE SZ10 (Stem) ×3 IMPLANT
SUT MNCRL AB 3-0 PS2 18 (SUTURE) ×3 IMPLANT
SUT MON AB 2-0 CT1 27 (SUTURE) ×3 IMPLANT
SUT VIC AB 1 CT1 27 (SUTURE) ×2
SUT VIC AB 1 CT1 27XBRD ANBCTR (SUTURE) ×1 IMPLANT
SUTURE TAPE 1.3 40 TPR END (SUTURE) ×2 IMPLANT
SUTURETAPE 1.3 40 TPR END (SUTURE) ×6
TOWEL OR 17X26 10 PK STRL BLUE (TOWEL DISPOSABLE) ×3 IMPLANT
WATER STERILE IRR 1000ML POUR (IV SOLUTION) ×3 IMPLANT

## 2018-07-30 NOTE — Progress Notes (Signed)
Report given to erika rn as caregiver 

## 2018-07-30 NOTE — Discharge Instructions (Signed)
° °Kevin M. Supple, M.D., F.A.A.O.S. °Orthopaedic Surgery °Specializing in Arthroscopic and Reconstructive °Surgery of the Shoulder and Knee °[phone] °[address] - [place], [place] [address] - Fax [phone] ° ° °POST-OP TOTAL SHOULDER REPLACEMENT INSTRUCTIONS ° °1. Call the office at [phone] to schedule your first post-op appointment 10-14 days from the date of your surgery. ° °2. The bandage over your incision is waterproof. You may begin showering with this dressing on. You may leave this dressing on until first follow up appointment within 2 weeks. We prefer you leave this dressing in place until follow up however after 5-7 days if you are having itching or skin irritation and would like to remove it you may do so. Go slow and tug at the borders gently to break the bond the dressing has with the skin. At this point if there is no drainage it is okay to go without a bandage or you may cover it with a light guaze and tape. You can also expect significant bruising around your shoulder that will drift down your arm and into your chest wall. This is very normal and should resolve over several days. ° ° 3. Wear your sling/immobilizer at all times except to perform the exercises below or to occasionally let your arm dangle by your side to stretch your elbow. You also need to sleep in your sling immobilizer until instructed otherwise. ° °4. Range of motion to your elbow, wrist, and hand are encouraged 3-5 times daily. Exercise to your hand and fingers helps to reduce swelling you may experience. ° °5. Utilize ice to the shoulder 3-5 times minimum a day and additionally if you are experiencing pain. ° °6. Prescriptions for a pain medication and a muscle relaxant are provided for you. It is recommended that if you are experiencing pain that you pain medication alone is not controlling, add the muscle relaxant along with the pain medication which can give additional pain relief. The first 1-2 days  is generally the most severe of your pain and then should gradually decrease. As your pain lessens it is recommended that you decrease your use of the pain medications to an "as needed basis'" only and to always comply with the recommended dosages of the pain medications. ° °7. Pain medications can produce constipation along with their use. If you experience this, the use of an over the counter stool softener or laxative daily is recommended.  ° °8. For additional questions or concerns, please do not hesitate to call the office. If after hours there is an answering service to forward your concerns to the physician on call. ° °9.Pain control following an exparel block ° °To help control your post-operative pain you received a nerve block  performed with Exparel which is a long acting anesthetic (numbing agent) which can provide pain relief and sensations of numbness (and relief of pain) in the operative shoulder and arm for up to 3 days. Sometimes it provides mixed relief, meaning you may still have numbness in certain areas of the arm but can still be able to move  parts of that arm, hand, and fingers. We recommend that your prescribed pain medications  be used as needed. We do not feel it is necessary to "pre medicate" and "stay ahead" of pain.  Taking narcotic pain medications when you are not having any pain can lead to unnecessary and potentially dangerous side effects.  ° °POST-OP EXERCISES ° °Pendulum Exercises ° °Perform pendulum exercises while standing and bending at   the waist. Support your uninvolved arm on a table or chair and allow your operated arm to hang freely. Make sure to do these exercises passively - not using you shoulder muscles. ° °Repeat 20 times. Do 3 sessions per day. ° ° ° ° °

## 2018-07-30 NOTE — H&P (Signed)
Todd Barrett.    Chief Complaint: left shoulder rotator cuff tear arthropathy HPI: The patient is a 67 y.o. male with chronic left shoulder pain and increasing functional limitations secondary to end stage left shoulder rotator cuff tear arthropathy.  Past Medical History:  Diagnosis Date  . ALLERGIC RHINITIS 01/08/2010  . Allergy    dust mites, pet dander  . ANEMIA-NOS 01/08/2010  . ANXIETY 01/08/2010  . Atrial fibrillation with RVR (HCC) 01/19/2018  . CERVICAL RADICULOPATHY, RIGHT 01/08/2010  . CHEST PAIN 01/08/2010  . DIVERTICULOSIS, COLON 01/08/2010  . GERD (gastroesophageal reflux disease)    Zantac OTC  prn  . GOUT 01/08/2010   in past/   . HYPERLIPIDEMIA 01/08/2010  . Lumbar disc disease 08/05/2011  . Osteoarthritis of right knee    end stage  . 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 Shortness of breath    with exertion    Past Surgical History:  Procedure Laterality Date  . ANTERIOR CERVICAL DECOMP/DISCECTOMY FUSION  07/23/2012   Procedure: ANTERIOR CERVICAL DECOMPRESSION/DISCECTOMY FUSION 2 LEVELS;  Surgeon: Clydene Fake, MD;  Location: [facility];  Service: Neurosurgery;  Laterality: N/A;  Cervical three-four, four-five, redo Cervical six-seven anterior cervical decompression, discectomy fusion with allograft and plating  . BACK SURGERY  03/2004, 02/2005  . CERVICAL SPINE SURGERY  05/2010  . clavicle facture  nov. 2009   100% displacement  . COLONOSCOPY    . HERNIA REPAIR     inguinal hernia/right side/ 67 years old  . JOINT REPLACEMENT     Left knee 2006  . KNEE ARTHROSCOPY     total of 4  . left knee arthroscopy  september 2005  . lumbar disc surgury  02/2002  . lumbar disc surgury  june 2004  . NASAL RECONSTRUCTION     x 3  . s/p ENT surgury     x 3  . s/p left shoulder surgury  feb. 2009  . s/p left TKR  april 2006  . TOTAL KNEE ARTHROPLASTY Right 01/16/2018   Procedure: RIGHT TOTAL KNEE ARTHROPLASTY;  Surgeon: Beverely Low, MD;  Location: [facility]  OR;  Service: Orthopedics;  Laterality: Right;    Family History  Problem Relation Age of Onset  . Bradycardia Father 60       Pacemaker  . Lymphoma Mother   . Alzheimer's disease Mother   . Pancreatic cancer Brother   . Colon cancer Neg Hx     Social History:  reports that he has never smoked. He has never used smokeless tobacco. He reports that he drinks about 12.0 standard drinks of alcohol per week. He reports that he does not use drugs.   Medications Prior to Admission  Medication Sig Dispense Refill  . atorvastatin (LIPITOR) 10 MG tablet TAKE 1 TABLET BY MOUTH EVERY DAY 90 tablet 3  . Calcium Polycarbophil (FIBER-CAPS PO) Take 2 capsules by mouth daily.     . fexofenadine (ALLEGRA) 180 MG tablet Take 180 mg by mouth daily.     . fluticasone (FLONASE) 50 MCG/ACT nasal spray Place 2 sprays into the nose daily as needed for allergies. For allergies.     . methocarbamol (ROBAXIN) 500 MG tablet Take 1 tablet (500 mg total) by mouth 3 (three) times daily as needed. 60 tablet 1  . ranitidine (ZANTAC) 150 MG tablet Take 150 mg by mouth daily as needed for heartburn.       Physical Exam: left shoulder with painful and restricted motion as noted at  recent office visits  Vitals  Temp:  [98.1 F (36.7 C)] 98.1 F (36.7 C) (09/05 0813) Pulse Rate:  [66] 66 (09/05 0813) Resp:  [18] 18 (09/05 0813) BP: (133)/(80) 133/80 (09/05 0813) SpO2:  [99 %] 99 % (09/05 0813) Weight:  [104.5 kg] 104.5 kg (09/05 0813)  Assessment/Plan  Impression: left shoulder rotator cuff tear arthropathy  Plan of Action: Procedure(s): LEFT REVERSE SHOULDER ARTHROPLASTY  Ziyan Schoon M Rhys Anchondo 07/30/2018, 9:27 AM Contact # [phone]

## 2018-07-30 NOTE — Anesthesia Procedure Notes (Signed)
Anesthesia Regional Block: Interscalene brachial plexus block   Pre-Anesthetic Checklist: ,, timeout performed, Correct Patient, Correct Site, Correct Laterality, Correct Procedure, Correct Position, site marked, Risks and benefits discussed,  Surgical consent,  Pre-op evaluation,  At surgeon's request and post-op pain management  Laterality: Left  Prep: Maximum Sterile Barrier Precautions used, chloraprep       Needles:  Injection technique: Single-shot  Needle Type: Echogenic Stimulator Needle     Needle Length: 9cm  Needle Gauge: 21     Additional Needles:   Procedures:,,,, ultrasound used (permanent image in chart),,,,  Narrative:  Start time: 07/30/2018 9:36 AM End time: 07/30/2018 9:44 AM Injection made incrementally with aspirations every 5 mL.  Performed by: Personally  Anesthesiologist: Lucretia Kern, MD  Additional Notes: Monitors applied. No increased pain on injection. No increased resistance to injection. Injection made in 5cc increments. Good needle visualization. Patient tolerated procedure well.

## 2018-07-30 NOTE — Op Note (Signed)
07/30/2018  11:47 AM  PATIENT:   Todd Barrett.  67 y.o. male  PRE-OPERATIVE DIAGNOSIS:  left shoulder rotator cuff tear arthropathy  POST-OPERATIVE DIAGNOSIS: Same  PROCEDURE: Left reverse shoulder arthroplasty utilizing a press-fit size 10 Arthrex stem with a neutral metaphysis, +6 polyethylene insert, 39+4 glenosphere, small baseplate  SURGEON:  Darianna Amy, Vania Rea M.D.  ASSISTANTS: Ralene Bathe PA-C  ANESTHESIA:   General endotracheal as well as an Exparel interscalene block  EBL: 150 cc  SPECIMEN: None  Drains: None   PATIENT DISPOSITION:  PACU - hemodynamically stable.    PLAN OF CARE: Admit for overnight observation  Brief history:  [name] is been followed for chronic and progressively increasing left shoulder pain related to an end stage left shoulder rotator cuff tear arthropathy.  Due to his increasing pain and functional mentation she is brought to the operating this time for planned left reverse shoulder arthroplasty  Preoperatively and counseled him regarding treatment options as well as the potential risks versus benefits thereof.  Possible surgical complications were reviewed including the potential for bleeding, infection, neurovascular injury, persistent pain, anesthetic complication, failure of the implant, and possible need for additional surgery.  He understands and accepts and agrees with the planned procedure.  Procedure detail:  After undergoing routine preop evaluation patient received prophylactic antibiotics and received an interscalene block placed in the holding area by the anesthesia department.  Placed supine on the operative table underwent smooth induction of a general endotracheal anesthesia.  Placed in the beachchair position and appropriately padded protected.  Left shoulder girdle region was sterilely prepped and draped in standard fashion.  Timeout was called.  An anterior deltopectoral approach left shoulder was made through a 10  cm incision.  Dissection carried sharply through skin and subcu with electrocautery used for hemostasis.  Deltopectoral interval was then developed from proximal to distal with the vein taken laterally.  The upper centimeter the pectoralis major was tenotomized to enhance exposure conjoined tendon mobilized and retracted medially long head biceps tendon was then tenodesed and unroofed and it appears that we had a previous biceps tenodesis more proximally.  Subscapularis was then divided from the lesser tuberosity and a peel technique and intact with a pair of suture tape sutures.  Capsular attachments from the anterior and inferior margins of the humeral neck within divided allowing deliver the humeral head to the wound.  Extra medullary guide was then used to outline the proposed humeral head resection was at approximate 20 degrees retroversion and this was completed with the oscillating saw.  Metal cap placed over the cut surface the proximal humerus we then exposed the glenoid with standard retractors.  A circumferential labral resection was then completed gaining complete visualization the periphery of the glenoid.  A guidepin was then introduced into the center of the glenoid with a slight inferior tilt approximate 10 degrees and then we reamed the glenoid to a stable subchondral bony bed.  A glenoid baseplate was then impacted after the glenoid preparation was completed.  The glenoid was fixated with the central lag screw followed by the inferior and superior locking screws all of which obtained excellent fit and fixation.  A 39+4 glenosphere was then placed over the baseplate after peripheral reaming was completed and this was impacted with good stability.  We then returned our attention to the proximal humerus where the canal was hand reamed up to size 7 and then broached up to size 10.  The metaphysis was then reamed  with a neutral reaming guide.  A trial reduction at this point then showed good soft tissue  balance and excellent stability.  The broach was then removed we did remove several retained suture anchors from the metaphyseal region.  Our final implant size 10 stem with a 39 metaphysis was assembled on the back table this was then impacted with excellent fit and fixation.  We then performed trial reductions and identify the +6 poly-insert as given [place] the best soft tissue balance with excellent stability.  The final +6 polyethylene insert was then impacted.  Final reduction was then performed after the joint was copiously irrigated.  This time we are very pleased with the shoulder motion good stability and good soft tissue balance.  At this point the subscapularis was then repaired back to the lesser tuberosity region utilizing the eyelets on the collar of our humeral stem.  Once this was completed we could easily allow 45 degrees of external rotation without excessive tension on the subscapularis.  This point the deltopectoral interval was then reapproximated with a series of figure-of-eight and 1 Vicryl sutures.  2-0 Vicryl used with subcu layer intracuticular through Monocryl for the skin followed by Dermabond and Aquasol dressing left arm placed in sling patient was awakened extubated and taken to the recovery room in stable condition.  French Ana Shuford PA-C was used as an Geophysicist/field seismologist throughout this case essential for help with positioning the patient, position extremity, tissue ablation, suture management, wound closure, and intraoperative decision-making.  Senaida Lange MD  Contact # [phone]

## 2018-07-30 NOTE — Anesthesia Preprocedure Evaluation (Addendum)
Anesthesia Evaluation  Patient identified by MRN, date of birth, ID band Patient awake    Reviewed: Allergy & Precautions, NPO status , Patient's Chart, lab work & pertinent test results  History of Anesthesia Complications Negative for: history of anesthetic complications  Airway Mallampati: II  TM Distance: >3 FB Neck ROM: Limited   Comment: Hx cervical spine fusion, limited extension Dental no notable dental hx.    Pulmonary neg pulmonary ROS,    Pulmonary exam normal breath sounds clear to auscultation       Cardiovascular Exercise Tolerance: Good Normal cardiovascular exam+ dysrhythmias Atrial Fibrillation  Rhythm:Regular Rate:Normal     Neuro/Psych Anxiety    GI/Hepatic Neg liver ROS, GERD  ,  Endo/Other  negative endocrine ROS  Renal/GU negative Renal ROS  negative genitourinary   Musculoskeletal  (+) Arthritis ,   Abdominal   Peds  Hematology negative hematology ROS (+)   Anesthesia Other Findings   Reproductive/Obstetrics                           Anesthesia Physical Anesthesia Plan  ASA: III  Anesthesia Plan: General   Post-op Pain Management: GA combined w/ Regional for post-op pain   Induction: Intravenous  PONV Risk Score and Plan: 2 and Ondansetron, Midazolam and Treatment may vary due to age or medical condition  Airway Management Planned: Oral ETT  Additional Equipment:   Intra-op Plan:   Post-operative Plan: Extubation in OR  Informed Consent: I have reviewed the patients History and Physical, chart, labs and discussed the procedure including the risks, benefits and alternatives for the proposed anesthesia with the patient or authorized representative who has indicated his/her understanding and acceptance.     Plan Discussed with: CRNA, Anesthesiologist and Surgeon  Anesthesia Plan Comments:         Anesthesia Quick Evaluation

## 2018-07-30 NOTE — Transfer of Care (Signed)
Immediate Anesthesia Transfer of Care Note  Patient: Todd Barrett.  Procedure(s) Performed: LEFT REVERSE SHOULDER ARTHROPLASTY (Left Shoulder)  Patient Location: PACU  Anesthesia Type:General  Level of Consciousness: awake, alert  and oriented  Airway & Oxygen Therapy: Patient Spontanous Breathing  Post-op Assessment: Report given to RN and Post -op Vital signs reviewed and stable  Post vital signs: Reviewed and stable  Last Vitals:  Vitals Value Taken Time  BP 124/73 07/30/2018 11:55 AM  Temp    Pulse 78 07/30/2018 11:59 AM  Resp 25 07/30/2018 11:59 AM  SpO2 96 % 07/30/2018 11:59 AM  Vitals shown include unvalidated device data.  Last Pain:  Vitals:   07/30/18 1153  TempSrc:   PainSc: (P) 0-No pain         Complications: No apparent anesthesia complications

## 2018-07-30 NOTE — Anesthesia Procedure Notes (Signed)
Procedure Name: Intubation Date/Time: 07/30/2018 10:04 AM Performed by: Gwyndolyn Saxon, CRNA Pre-anesthesia Checklist: Patient identified, Emergency Drugs available, Suction available and Patient being monitored Patient Re-evaluated:Patient Re-evaluated prior to induction Oxygen Delivery Method: Circle System Utilized Preoxygenation: Pre-oxygenation with 100% oxygen Induction Type: IV induction Ventilation: Mask ventilation without difficulty and Oral airway inserted - appropriate to patient size Laryngoscope Size: Mac and 4 Grade View: Grade I Tube type: Oral Tube size: 7.5 mm Number of attempts: 1 Airway Equipment and Method: Stylet and Oral airway Placement Confirmation: ETT inserted through vocal cords under direct vision,  positive ETCO2 and breath sounds checked- equal and bilateral Secured at: 23 cm Tube secured with: Tape Dental Injury: Teeth and Oropharynx as per pre-operative assessment  Comments: Intubated by [facility]

## 2018-07-31 ENCOUNTER — Encounter (HOSPITAL_COMMUNITY): Payer: Self-pay | Admitting: Orthopedic Surgery

## 2018-07-31 MED ORDER — KETOROLAC TROMETHAMINE 15 MG/ML IJ SOLN
INTRAMUSCULAR | Status: AC
  Administered 2018-07-31: 7.5 mg via INTRAVENOUS
  Filled 2018-07-31: qty 1

## 2018-07-31 MED ORDER — METHOCARBAMOL 500 MG PO TABS: 500.0000 mg | ORAL_TABLET | Freq: Three times a day (TID) | ORAL | 1 refills | Status: DC | PRN

## 2018-07-31 MED ORDER — OXYCODONE-ACETAMINOPHEN 5-325 MG PO TABS: 1.0000 | ORAL_TABLET | ORAL | 0 refills | Status: DC | PRN

## 2018-07-31 MED ORDER — ONDANSETRON HCL 4 MG PO TABS: 4.0000 mg | ORAL_TABLET | Freq: Three times a day (TID) | ORAL | 0 refills | Status: DC | PRN

## 2018-07-31 NOTE — Progress Notes (Signed)
Discharge instructions completed with pt.  Pt verbalized understanding of the information.  Pt denies chest pain, shortness of breath, dizziness, lightheadedness, and n/v.  Pt's IV discharged.  Pt discharged home.  

## 2018-07-31 NOTE — Progress Notes (Deleted)
Todd Barrett.  MRN: [id number] DOB/Age: 1951-09-03 67 y.o. [facility] Procedure: Procedure(s) (LRB): LEFT REVERSE SHOULDER ARTHROPLASTY (Left)     Subjective: Block is still working, nursing medicated her last night not because of pain, just because she could feel her fingers a little.   Vital Signs Temp:  [97.7 F (36.5 C)-98.7 F (37.1 C)] 98.7 F (37.1 C) (09/05 2038) Pulse Rate:  [56-83] 83 (09/05 2038) Resp:  [12-20] 18 (09/05 2038) BP: (117-133)/(68-88) 128/86 (09/05 2038) SpO2:  [93 %-100 %] 93 % (09/05 2038) Weight:  [104.5 kg] 104.5 kg (09/05 0813)  Lab Results No results for input(s): WBC, HGB, HCT, PLT in the last 72 hours. BMET No results for input(s): NA, K, CL, CO2, GLUCOSE, BUN, CREATININE, CALCIUM in the last 72 hours. INR  Date Value Ref Range Status  07/23/2012 1.03 0.00 - 1.49 Final     Exam Moving fingers but block still providing relief proximally        Plan Educated patient and family on block and expectations DC home after OT  [employer] PA-C  07/31/2018, 8:12 AM Contact # [phone]

## 2018-07-31 NOTE — Anesthesia Postprocedure Evaluation (Signed)
Anesthesia Post Note  Patient: Hieu Rieken.  Procedure(s) Performed: LEFT REVERSE SHOULDER ARTHROPLASTY (Left Shoulder)     Patient location during evaluation: PACU Anesthesia Type: General Level of consciousness: awake and alert Pain management: pain level controlled Vital Signs Assessment: post-procedure vital signs reviewed and stable Respiratory status: spontaneous breathing, nonlabored ventilation, respiratory function stable and patient connected to nasal cannula oxygen Cardiovascular status: blood pressure returned to baseline and stable Postop Assessment: no apparent nausea or vomiting Anesthetic complications: no    Last Vitals:  Vitals:   07/30/18 1434 07/30/18 2038  BP: 129/88 128/86  Pulse: 78 83  Resp:  18  Temp: 36.7 C 37.1 C  SpO2: 98% 93%    Last Pain:  Vitals:   07/31/18 0917  TempSrc:   PainSc: 0-No pain                 Dwayne Begay S

## 2018-07-31 NOTE — Evaluation (Addendum)
Occupational Therapy Evaluation and Discharge  Patient Details Name: Todd Barrett. MRN: [id number] DOB: 1951/04/13 Today's Date: 07/31/2018    History of Present Illness Pt is a 67 y/o male s/p L reverse shoulder arthroplasty after chronic pain and increasing functional limitations.  PMH significant for but not limited to: OA, a-fib, ACDF, B TKA, L shoulder sx (2009).    Clinical Impression   PTA patient independent and working. Patient currently requires min assist for UB ADLs, min assist for LB ADLs, min assist for toileting, and supervision for toilet transfers.  Educated on precautions, safety, exercises, positioning, sling management and wear schedule, pain management with ice, ADL compensatory techniques, DME and recommendations.  Patient will have support of spouse for first couple days 24/7, fading to intermittently as needed; but will have support as needed for ADLs per patient.  Based on performance today, no further OT needs are required.  No further questions or concerns.  Thank you for this referral! OT signing off.     Follow Up Recommendations  No OT follow up;Supervision/Assistance - 24 hour    Equipment Recommendations  None recommended by OT    Recommendations for Other Services       Precautions / Restrictions Precautions Precautions: Shoulder Type of Shoulder Precautions: Conservative Protocol: no shoulder ROM except during ALDs (external rotation 20, abdcution 45, flexion 60 (passive to active)), AROM elbow/wrist/hand as tolerated, OK for pendulums and lap slides  Shoulder Interventions: Shoulder sling/immobilizer;At all times;Off for dressing/bathing/exercises Precaution Booklet Issued: Yes (comment) Precaution Comments: reviewed precautions thoroughly with patient Required Braces or Orthoses: Sling Restrictions Weight Bearing Restrictions: Yes LUE Weight Bearing: Non weight bearing      Mobility Bed Mobility Overal bed mobility: Needs Assistance Bed  Mobility: Supine to Sit     Supine to sit: Supervision;HOB elevated     General bed mobility comments: supervision for safety, good adherance to NWB L UE   Transfers Overall transfer level: Needs assistance Equipment used: None Transfers: Sit to/from Stand Sit to Stand: Supervision         General transfer comment: supervision for safety    Balance Overall balance assessment: No apparent balance deficits (not formally assessed)                                         ADL either performed or assessed with clinical judgement   ADL Overall ADL's : Needs assistance/impaired Eating/Feeding: Set up;Sitting   Grooming: Set up;Sitting   Upper Body Bathing: Minimal assistance;Sitting;Cueing for compensatory techniques;Cueing for safety   Lower Body Bathing: Supervison/ safety;Sit to/from stand;Cueing for compensatory techniques   Upper Body Dressing : Minimal assistance;Sitting;Cueing for compensatory techniques;Cueing for UE precautions   Lower Body Dressing: Minimal assistance;Cueing for safety;Cueing for compensatory techniques;Sit to/from stand Lower Body Dressing Details (indicate cue type and reason): min assist to manage clothing over L hip Toilet Transfer: Supervision/safety;Ambulation(simulated in room)   Toileting- Clothing Manipulation and Hygiene: Minimal assistance;Sit to/from stand;Cueing for compensatory techniques Toileting - Clothing Manipulation Details (indicate cue type and reason): min assist to manage clothing on L hip  Tub/ Shower Transfer: Supervision/safety;Ambulation   Functional mobility during ADLs: Supervision/safety General ADL Comments: Patient highly motivated, reports will have assistance from spouse for ADLs at dc.      Vision Baseline Vision/History: Wears glasses Wears Glasses: Reading only Patient Visual Report: No change from baseline Vision Assessment?: No apparent visual  deficits     Perception     Praxis       Pertinent Vitals/Pain Pain Assessment: Faces Faces Pain Scale: Hurts little more Pain Location: L shoulder  Pain Descriptors / Indicators: Operative site guarding;Discomfort Pain Intervention(s): Limited activity within patient's tolerance     Hand Dominance     Extremity/Trunk Assessment Upper Extremity Assessment Upper Extremity Assessment: LUE deficits/detail LUE Deficits / Details: limited due to precautions s/p shoulder surgery; nerve block active  LUE Sensation: (nerve block active) LUE Coordination: decreased fine motor;decreased gross motor   Lower Extremity Assessment Lower Extremity Assessment: Overall WFL for tasks assessed   Cervical / Trunk Assessment Cervical / Trunk Assessment: Normal   Communication Communication Communication: No difficulties   Cognition Arousal/Alertness: Awake/alert Behavior During Therapy: WFL for tasks assessed/performed Overall Cognitive Status: Within Functional Limits for tasks assessed                                     General Comments       Exercises Exercises: Shoulder;Other exercises Shoulder Exercises Pendulum Exercise: Left;5 reps;Standing(all planes ) Elbow Flexion: AAROM;Left;10 reps;Seated Elbow Extension: AROM;Left;10 reps;Seated Wrist Flexion: AROM;Left;10 reps;Seated Wrist Extension: AROM;Left;10 reps;Seated Digit Composite Flexion: AROM;Left;10 reps;Seated Composite Extension: AROM;Left;10 reps;Seated Other Exercises Other Exercises: supination/pronation, L UE, AAROM 10 reps, seated   Shoulder Instructions Shoulder Instructions Donning/doffing shirt without moving shoulder: Minimal assistance Method for sponge bathing under operated UE: Minimal assistance Donning/doffing sling/immobilizer: Minimal assistance Correct positioning of sling/immobilizer: Minimal assistance Pendulum exercises (written home exercise program): Supervision/safety ROM for elbow, wrist and digits of operated UE:  Minimal assistance Sling wearing schedule (on at all times/off for ADL's): Supervision/safety Proper positioning of operated UE when showering: Minimal assistance Positioning of UE while sleeping: Supervision/safety    Home Living Family/patient expects to be discharged to:: Private residence Living Arrangements: Spouse/significant other Available Help at Discharge: Family;Available PRN/intermittently Type of Home: House Home Access: Stairs to enter [employer] of Steps: 2 Entrance Stairs-Rails: None Home Layout: Two level;Able to live on main level with bedroom/bathroom     Bathroom Shower/Tub: Producer, television/film/video: Handicapped height     Home Equipment: Cane - single point;Crutches          Prior Functioning/Environment Level of Independence: Independent        Comments: independent, driving and working (limited at times due to pain)        OT Problem List: Decreased strength;Decreased range of motion;Decreased activity tolerance;Decreased safety awareness;Decreased knowledge of precautions;Pain;Impaired UE functional use;Decreased coordination      OT Treatment/Interventions:      OT Goals(Current goals can be found in the care plan section) Acute Rehab OT Goals Patient Stated Goal: home today OT Goal Formulation: With patient  OT Frequency:     Barriers to D/C:            Co-evaluation              AM-PAC PT "6 Clicks" Daily Activity     Outcome Measure Help from another person eating meals?: A Little Help from another person taking care of personal grooming?: A Little Help from another person toileting, which includes using toliet, bedpan, or urinal?: A Little Help from another person bathing (including washing, rinsing, drying)?: A Little Help from another person to put on and taking off regular upper body clothing?: A Little Help from another person to put on and  taking off regular lower body clothing?: A Little 6 Click  Score: 18   End of Session Equipment Utilized During Treatment: Other (comment)(sling) Nurse Communication: Mobility status  Activity Tolerance: Patient tolerated treatment well Patient left: with call bell/phone within reach(seated EOB)  OT Visit Diagnosis: Pain Pain - Right/Left: Left Pain - part of body: Shoulder                Time: 1610-9604 OT Time Calculation (min): 24 min Charges:  OT General Charges $OT Visit: 1 Visit OT Evaluation $OT Eval Low Complexity: 1 Low OT Treatments $Self Care/Home Management : 8-22 mins  Chancy Milroy, OT Acute Rehabilitation Services Pager [phone] Office [phone]   Chancy Milroy 07/31/2018, 9:07 AM

## 2018-07-31 NOTE — Progress Notes (Signed)
Mehtaab Gertie Exon.  MRN: [id number] DOB/Age: [birth year] 68 y.o. [facility] Procedure: Procedure(s) (LRB): LEFT REVERSE SHOULDER ARTHROPLASTY (Left)     Subjective: Doing great this am, no pain. Feels fingers but block still in effect  Vital Signs Temp:  [97.7 F (36.5 C)-98.7 F (37.1 C)] 98.7 F (37.1 C) (09/05 2038) Pulse Rate:  [56-83] 83 (09/05 2038) Resp:  [12-20] 18 (09/05 2038) BP: (117-131)/(68-88) 128/86 (09/05 2038) SpO2:  [93 %-100 %] 93 % (09/05 2038)  Lab Results No results for input(s): WBC, HGB, HCT, PLT in the last 72 hours. BMET No results for input(s): NA, K, CL, CO2, GLUCOSE, BUN, CREATININE, CALCIUM in the last 72 hours. INR  Date Value Ref Range Status  07/23/2012 1.03 0.00 - 1.49 Final     Exam Dressing dry Moving fingers        Plan DC after OT  Ralene Bathe PA-C  07/31/2018, 8:33 AM Contact # [phone]

## 2018-08-03 NOTE — Discharge Summary (Signed)
PATIENT ID:      Todd Barrett.  MRN:     [id number] DOB/AGE:    [birth year] / 67 y.o.     DISCHARGE SUMMARY  ADMISSION DATE:    07/30/2018 DISCHARGE DATE:  07/31/2018  ADMISSION DIAGNOSIS: left shoulder rotator cuff tear arthropathy Past Medical History:  Diagnosis Date  . ALLERGIC RHINITIS 01/08/2010  . Allergy    dust mites, pet dander  . ANEMIA-NOS 01/08/2010  . ANXIETY 01/08/2010  . Atrial fibrillation with RVR (HCC) 01/19/2018  . CERVICAL RADICULOPATHY, RIGHT 01/08/2010  . CHEST PAIN 01/08/2010  . DIVERTICULOSIS, COLON 01/08/2010  . GERD (gastroesophageal reflux disease)    Zantac OTC  prn  . GOUT 01/08/2010   in past/   . HYPERLIPIDEMIA 01/08/2010  . Lumbar disc disease 08/05/2011  . Osteoarthritis of right knee    end stage  . 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 Shortness of breath    with exertion    DISCHARGE DIAGNOSIS:   Active Problems:   S/p reverse total shoulder arthroplasty   PROCEDURE: Procedure(s): LEFT REVERSE SHOULDER ARTHROPLASTY on 07/30/2018  CONSULTS:    HISTORY:  See H&P in chart.  HOSPITAL COURSE:  Todd Barrett. is a 67 y.o. admitted on 07/30/2018 with a diagnosis of left shoulder rotator cuff tear arthropathy.  They were brought to the operating room on 07/30/2018 and underwent Procedure(s): LEFT REVERSE SHOULDER ARTHROPLASTY.    They were given perioperative antibiotics:  Anti-infectives (From admission, onward)   Start     Dose/Rate Route Frequency Ordered Stop   07/30/18 0815  ceFAZolin (ANCEF) IVPB 2g/100 mL premix     2 g 200 mL/hr over 30 Minutes Intravenous On call to O.R. 07/30/18 0807 07/30/18 1005   07/30/18 0815  ceFAZolin (ANCEF) 3 g in dextrose 5 % 50 mL IVPB  Status:  Discontinued     3 g 100 mL/hr over 30 Minutes Intravenous On call to O.R. 07/30/18 4098 07/30/18 1191    .  Patient underwent the above named procedure and tolerated it well. The following day they were hemodynamically stable and pain was  controlled on oral analgesics. They were neurovascularly intact to the operative extremity. OT was ordered and worked with patient per protocol. They were medically and orthopaedically stable for discharge on 07/31/2018.   DIAGNOSTIC STUDIES:  RECENT RADIOGRAPHIC STUDIES :  No results found.  RECENT VITAL SIGNS:  No data found.Marland Kitchen  RECENT EKG RESULTS:    Orders placed or performed in visit on 02/03/18  . EKG 12-Lead    DISCHARGE INSTRUCTIONS:    DISCHARGE MEDICATIONS:   Allergies as of 07/31/2018      Reactions   Dust Mite Extract Other (See Comments)   Nasal Congestion   Other Other (See Comments)   Pet Dander- Runny nose      Medication List    TAKE these medications   atorvastatin 10 MG tablet Commonly known as:  LIPITOR TAKE 1 TABLET BY MOUTH EVERY DAY   fexofenadine 180 MG tablet Commonly known as:  ALLEGRA Take 180 mg by mouth daily.   FIBER-CAPS PO Take 2 capsules by mouth daily.   fluticasone 50 MCG/ACT nasal spray Commonly known as:  FLONASE Place 2 sprays into the nose daily as needed for allergies. For allergies.   methocarbamol 500 MG tablet Commonly known as:  ROBAXIN Take 1 tablet (500 mg total) by mouth 3 (three) times daily as needed. What changed:  Another medication with the same name was  added. Make sure you understand how and when to take each.   methocarbamol 500 MG tablet Commonly known as:  ROBAXIN Take 1 tablet (500 mg total) by mouth every 8 (eight) hours as needed for muscle spasms. What changed:  You were already taking a medication with the same name, and this prescription was added. Make sure you understand how and when to take each.   ondansetron 4 MG tablet Commonly known as:  ZOFRAN Take 1 tablet (4 mg total) by mouth every 8 (eight) hours as needed for nausea or vomiting.   oxyCODONE-acetaminophen 5-325 MG tablet Commonly known as:  PERCOCET/ROXICET Take 1 tablet by mouth every 4 (four) hours as needed (max 6 q).   ranitidine  150 MG tablet Commonly known as:  ZANTAC Take 150 mg by mouth daily as needed for heartburn.       FOLLOW UP VISIT:   Follow-up Information    Francena Hanly, MD.   Specialty:  Orthopedic Surgery Why:  call to be seen in 10-14 days Contact information: [address] [place] [address] [phone]           DISCHARGE NI:OEVO   DISCHARGE CONDITION:  Todd Barrett for Dr. Francena Hanly 08/03/2018, 5:11 PM

## 2018-08-12 DIAGNOSIS — M75122 Complete rotator cuff tear or rupture of left shoulder, not specified as traumatic: Secondary | ICD-10-CM | POA: Diagnosis not present

## 2018-08-12 DIAGNOSIS — Z471 Aftercare following joint replacement surgery: Secondary | ICD-10-CM | POA: Diagnosis not present

## 2018-08-17 DIAGNOSIS — M25512 Pain in left shoulder: Secondary | ICD-10-CM | POA: Diagnosis not present

## 2018-08-25 DIAGNOSIS — M25512 Pain in left shoulder: Secondary | ICD-10-CM | POA: Diagnosis not present

## 2018-09-01 DIAGNOSIS — M25512 Pain in left shoulder: Secondary | ICD-10-CM | POA: Diagnosis not present

## 2018-09-08 DIAGNOSIS — M25512 Pain in left shoulder: Secondary | ICD-10-CM | POA: Diagnosis not present

## 2018-09-16 DIAGNOSIS — M25512 Pain in left shoulder: Secondary | ICD-10-CM | POA: Diagnosis not present

## 2018-09-30 DIAGNOSIS — M25512 Pain in left shoulder: Secondary | ICD-10-CM | POA: Diagnosis not present

## 2018-10-14 DIAGNOSIS — M25512 Pain in left shoulder: Secondary | ICD-10-CM | POA: Diagnosis not present

## 2018-10-21 DIAGNOSIS — M25512 Pain in left shoulder: Secondary | ICD-10-CM | POA: Diagnosis not present

## 2018-12-16 ENCOUNTER — Other Ambulatory Visit: Payer: Self-pay | Admitting: Internal Medicine

## 2018-12-17 ENCOUNTER — Ambulatory Visit (INDEPENDENT_AMBULATORY_CARE_PROVIDER_SITE_OTHER): Payer: Medicare Other | Admitting: Internal Medicine

## 2018-12-17 ENCOUNTER — Encounter: Payer: Self-pay | Admitting: Internal Medicine

## 2018-12-17 ENCOUNTER — Other Ambulatory Visit (INDEPENDENT_AMBULATORY_CARE_PROVIDER_SITE_OTHER): Payer: Medicare Other

## 2018-12-17 VITALS — BP 142/94 | HR 71 | Temp 98.0°F | Ht 70.0 in | Wt 231.0 lb

## 2018-12-17 DIAGNOSIS — R739 Hyperglycemia, unspecified: Secondary | ICD-10-CM

## 2018-12-17 DIAGNOSIS — E785 Hyperlipidemia, unspecified: Secondary | ICD-10-CM

## 2018-12-17 DIAGNOSIS — Z23 Encounter for immunization: Secondary | ICD-10-CM

## 2018-12-17 DIAGNOSIS — N320 Bladder-neck obstruction: Secondary | ICD-10-CM | POA: Diagnosis not present

## 2018-12-17 DIAGNOSIS — D649 Anemia, unspecified: Secondary | ICD-10-CM | POA: Diagnosis not present

## 2018-12-17 LAB — URINALYSIS, ROUTINE W REFLEX MICROSCOPIC
Bilirubin Urine: NEGATIVE
Ketones, ur: NEGATIVE
Leukocytes, UA: NEGATIVE
Nitrite: NEGATIVE
Specific Gravity, Urine: 1.025 (ref 1.000–1.030)
Total Protein, Urine: NEGATIVE
Urine Glucose: NEGATIVE
Urobilinogen, UA: 0.2 (ref 0.0–1.0)
pH: 5.5 (ref 5.0–8.0)

## 2018-12-17 LAB — CBC WITH DIFFERENTIAL/PLATELET
Basophils Absolute: 0.1 10*3/uL (ref 0.0–0.1)
Basophils Relative: 1.2 % (ref 0.0–3.0)
Eosinophils Absolute: 0.1 10*3/uL (ref 0.0–0.7)
Eosinophils Relative: 1.7 % (ref 0.0–5.0)
HCT: 44.1 % (ref 39.0–52.0)
Hemoglobin: 15.4 g/dL (ref 13.0–17.0)
LYMPHS PCT: 27.8 % (ref 12.0–46.0)
Lymphs Abs: 1.6 10*3/uL (ref 0.7–4.0)
MCHC: 35.1 g/dL (ref 30.0–36.0)
MCV: 86.9 fl (ref 78.0–100.0)
Monocytes Absolute: 0.5 10*3/uL (ref 0.1–1.0)
Monocytes Relative: 9.1 % (ref 3.0–12.0)
Neutro Abs: 3.6 10*3/uL (ref 1.4–7.7)
Neutrophils Relative %: 60.2 % (ref 43.0–77.0)
Platelets: 204 10*3/uL (ref 150.0–400.0)
RBC: 5.07 Mil/uL (ref 4.22–5.81)
RDW: 13.3 % (ref 11.5–15.5)
WBC: 5.9 10*3/uL (ref 4.0–10.5)

## 2018-12-17 LAB — LIPID PANEL
Cholesterol: 142 mg/dL (ref 0–200)
HDL: 59.6 mg/dL (ref >39.00)
LDL Cholesterol: 72 mg/dL (ref 0–99)
NonHDL: 82.5
Total CHOL/HDL Ratio: 2
Triglycerides: 51 mg/dL (ref 0.0–149.0)
VLDL: 10.2 mg/dL (ref 0.0–40.0)

## 2018-12-17 LAB — HEMOGLOBIN A1C: Hgb A1c MFr Bld: 5.6 % (ref 4.6–6.5)

## 2018-12-17 LAB — HEPATIC FUNCTION PANEL
ALT: 37 U/L (ref 0–53)
AST: 34 U/L (ref 0–37)
Albumin: 4.7 g/dL (ref 3.5–5.2)
Alkaline Phosphatase: 65 U/L (ref 39–117)
Bilirubin, Direct: 0.2 mg/dL (ref 0.0–0.3)
TOTAL PROTEIN: 6.9 g/dL (ref 6.0–8.3)
Total Bilirubin: 0.8 mg/dL (ref 0.2–1.2)

## 2018-12-17 LAB — BASIC METABOLIC PANEL
BUN: 25 mg/dL — ABNORMAL HIGH (ref 6–23)
CHLORIDE: 106 meq/L (ref 96–112)
CO2: 25 mEq/L (ref 19–32)
Calcium: 9.6 mg/dL (ref 8.4–10.5)
Creatinine, Ser: 1.02 mg/dL (ref 0.40–1.50)
GFR: 72.6 mL/min (ref >60.00)
Glucose, Bld: 105 mg/dL — ABNORMAL HIGH (ref 70–99)
Potassium: 3.8 mEq/L (ref 3.5–5.1)
Sodium: 140 mEq/L (ref 135–145)

## 2018-12-17 LAB — TSH: TSH: 2.02 u[IU]/mL (ref 0.35–4.50)

## 2018-12-17 LAB — PSA: PSA: 0.63 ng/mL (ref 0.10–4.00)

## 2018-12-17 MED ORDER — ZOSTER VAC RECOMB ADJUVANTED 50 MCG/0.5ML IM SUSR: 0.5000 mL | Freq: Once | INTRAMUSCULAR | 1 refills | Status: AC

## 2018-12-17 NOTE — Assessment & Plan Note (Signed)
stable overall by history and exam, recent data reviewed with pt, and pt to continue medical treatment as before,  to f/u any worsening symptoms or concerns  

## 2018-12-17 NOTE — Progress Notes (Signed)
Subjective:    Patient ID: Todd Barrett., male    DOB: 26-Apr-1951, 68 y.o.   MRN: [id number]  HPI  Here for yearly f/u;  Overall doing ok;  Pt denies Chest pain, worsening SOB, DOE, wheezing, orthopnea, PND, worsening LE edema, palpitations, dizziness or syncope.  Pt denies neurological change such as new headache, facial or extremity weakness.  Pt denies polydipsia, polyuria, or low sugar symptoms. Pt states overall good compliance with treatment and medications, good tolerability, and has been trying to follow appropriate diet.  Pt denies worsening depressive symptoms, suicidal ideation or panic. No fever, night sweats, wt loss, loss of appetite, or other constitutional symptoms.  Pt states good ability with ADL's, has low fall risk, home safety reviewed and adequate, no other significant changes in hearing or vision, and  occasionally active with exercise. S/p  knee replacement (right in feb 2019 Dr Ranell Patrick then, left shoudler replaced in sept 2019 (dr Rennis Chris).   BP Readings from Last 3 Encounters:  12/17/18 (!) 142/94  07/30/18 128/86  07/21/18 135/69  Wt improving per pt peak wt has been 235 at home recently., has been up to 250 in the past.  Wt Readings from Last 3 Encounters:  12/17/18 231 lb (104.8 kg)  07/30/18 230 lb 6.1 oz (104.5 kg)  07/21/18 230 lb 6.1 oz (104.5 kg)   Past Medical History:  Diagnosis Date  . ALLERGIC RHINITIS 01/08/2010  . Allergy    dust mites, pet dander  . ANEMIA-NOS 01/08/2010  . ANXIETY 01/08/2010  . Atrial fibrillation with RVR (HCC) 01/19/2018  . CERVICAL RADICULOPATHY, RIGHT 01/08/2010  . CHEST PAIN 01/08/2010  . DIVERTICULOSIS, COLON 01/08/2010  . GERD (gastroesophageal reflux disease)    Zantac OTC  prn  . GOUT 01/08/2010   in past/   . HYPERLIPIDEMIA 01/08/2010  . Lumbar disc disease 08/05/2011  . Osteoarthritis of right knee    end stage  . 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 Shortness of breath    with exertion   Past  Surgical History:  Procedure Laterality Date  . ANTERIOR CERVICAL DECOMP/DISCECTOMY FUSION  07/23/2012   Procedure: ANTERIOR CERVICAL DECOMPRESSION/DISCECTOMY FUSION 2 LEVELS;  Surgeon: Clydene Fake, MD;  Location: [facility];  Service: Neurosurgery;  Laterality: N/A;  Cervical three-four, four-five, redo Cervical six-seven anterior cervical decompression, discectomy fusion with allograft and plating  . BACK SURGERY  03/2004, 02/2005  . CERVICAL SPINE SURGERY  05/2010  . clavicle facture  nov. 2009   100% displacement  . COLONOSCOPY    . HERNIA REPAIR     inguinal hernia/right side/ 68 years old  . JOINT REPLACEMENT     Left knee 2006  . KNEE ARTHROSCOPY     total of 4  . left knee arthroscopy  september 2005  . lumbar disc surgury  02/2002  . lumbar disc surgury  june 2004  . NASAL RECONSTRUCTION     x 3  . REVERSE SHOULDER ARTHROPLASTY Left 07/30/2018   Procedure: LEFT REVERSE SHOULDER ARTHROPLASTY;  Surgeon: Francena Hanly, MD;  Location: [facility] OR;  Service: Orthopedics;  Laterality: Left;   . s/p ENT surgury     x 3  . s/p left shoulder surgury  feb. 2009  . s/p left TKR  april 2006  . TOTAL KNEE ARTHROPLASTY Right 01/16/2018   Procedure: RIGHT TOTAL KNEE ARTHROPLASTY;  Surgeon: Beverely Low, MD;  Location: [facility] OR;  Service: Orthopedics;  Laterality: Right;    reports that he  has never smoked. He has never used smokeless tobacco. He reports current alcohol use of about 12.0 standard drinks of alcohol per week. He reports that he does not use drugs. family history includes Alzheimer's disease in his mother; Bradycardia (age of onset: 13) in his father; Lymphoma in his mother; Pancreatic cancer in his brother. Allergies  Allergen Reactions  . Dust Mite Extract Other (See Comments)    Nasal Congestion  . Other Other (See Comments)    Pet Dander- Runny nose   Current Outpatient Medications on File Prior to Visit  Medication Sig Dispense Refill  . atorvastatin (LIPITOR) 10 MG  tablet TAKE 1 TABLET BY MOUTH EVERY DAY 90 tablet 0  . fexofenadine (ALLEGRA) 180 MG tablet Take 180 mg by mouth daily.     . fluticasone (FLONASE) 50 MCG/ACT nasal spray Place 2 sprays into the nose daily as needed for allergies. For allergies.     . methocarbamol (ROBAXIN) 500 MG tablet Take 1 tablet (500 mg total) by mouth every 8 (eight) hours as needed for muscle spasms. 30 tablet 1   No current facility-administered medications on file prior to visit.    Review of Systems  Constitutional: Negative for other unusual diaphoresis or sweats HENT: Negative for ear discharge or swelling Eyes: Negative for other worsening visual disturbances Respiratory: Negative for stridor or other swelling  Gastrointestinal: Negative for worsening distension or other blood Genitourinary: Negative for retention or other urinary change Musculoskeletal: Negative for other MSK pain or swelling Skin: Negative for color change or other new lesions Neurological: Negative for worsening tremors and other numbness  Psychiatric/Behavioral: Negative for worsening agitation or other fatigue ALl other system neg per pt    Objective:   Physical Exam BP (!) 142/94   Pulse 71   Temp 98 F (36.7 C) (Oral)   Ht 5\' 10"  (1.778 m)   Wt 231 lb (104.8 kg)   SpO2 95%   BMI 33.15 kg/m  VS noted,  Constitutional: Pt appears in NAD HENT: Head: NCAT.  Right Ear: External ear normal.  Left Ear: External ear normal.  Eyes: . Pupils are equal, round, and reactive to light. Conjunctivae and EOM are normal Nose: without d/c or deformity Neck: Neck supple. Gross normal ROM Cardiovascular: Normal rate and regular rhythm.   Pulmonary/Chest: Effort normal and breath sounds without rales or wheezing.  Abd:  Soft, NT, ND, + BS, no organomegaly Neurological: Pt is alert. At baseline orientation, motor grossly intact Skin: Skin is warm. No rashes, other new lesions, no LE edema Psychiatric: Pt behavior is normal without  agitation  No other exam findings Lab Results  Component Value Date   WBC 5.9 12/17/2018   HGB 15.4 12/17/2018   HCT 44.1 12/17/2018   PLT 204.0 12/17/2018   GLUCOSE 105 (H) 12/17/2018   CHOL 142 12/17/2018   TRIG 51.0 12/17/2018   HDL 59.60 12/17/2018   LDLDIRECT 142.0 09/29/2012   LDLCALC 72 12/17/2018   ALT 37 12/17/2018   AST 34 12/17/2018   NA 140 12/17/2018   K 3.8 12/17/2018   CL 106 12/17/2018   CREATININE 1.02 12/17/2018   BUN 25 (H) 12/17/2018   CO2 25 12/17/2018   TSH 2.02 12/17/2018   PSA 0.63 12/17/2018   INR 1.03 07/23/2012   HGBA1C 5.6 12/17/2018   MICROALBUR 1.0 11/27/2016       Assessment & Plan:

## 2018-12-17 NOTE — Patient Instructions (Signed)
You had the Tdap tetanus shot today  Your shingles shot prescription was sent to the pharmacy  Please continue all other medications as before, and refills have been done if requested.  Please have the pharmacy call with any other refills you may need.  Please continue your efforts at being more active, low cholesterol diet, and weight control.  You are otherwise up to date with prevention measures today.  Please keep your appointments with your specialists as you may have planned  Please go to the LAB in the Basement (turn left off the elevator) for the tests to be done today  You will be contacted by phone if any changes need to be made immediately.  Otherwise, you will receive a letter about your results with an explanation, but please check with MyChart first.  Please remember to sign up for MyChart if you have not done so, as this will be important to you in the future with finding out test results, communicating by private email, and scheduling acute appointments online when needed.  Please return in 1 year for your yearly visit, or sooner if needed

## 2019-02-01 DIAGNOSIS — H2513 Age-related nuclear cataract, bilateral: Secondary | ICD-10-CM | POA: Diagnosis not present

## 2019-03-09 ENCOUNTER — Other Ambulatory Visit: Payer: Self-pay | Admitting: Internal Medicine

## 2019-06-08 IMAGING — DX DG KNEE 1-2V PORT*R*
1 series · 2 of 2 positions shown · non-contrast
Comparison: None.

CLINICAL DATA: Status post total knee replacement

EXAM:
PORTABLE RIGHT KNEE - 1-2 VIEW

[Series 1: knee · 0.14mm/px · 2 of 2 slices shown]
[im 1/2]
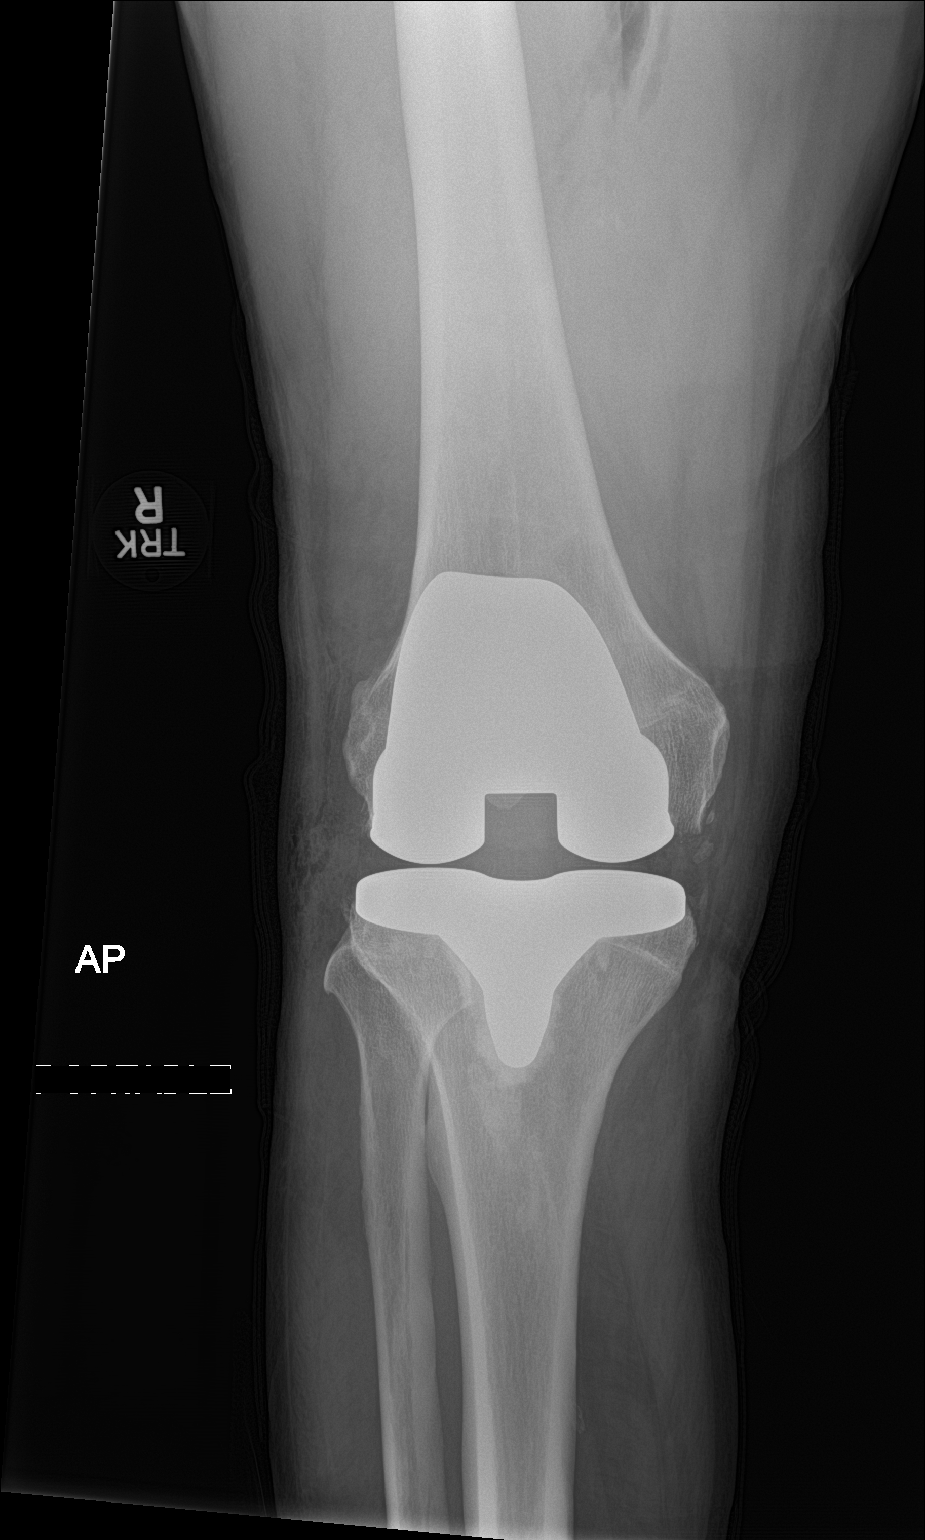
[im 2/2]
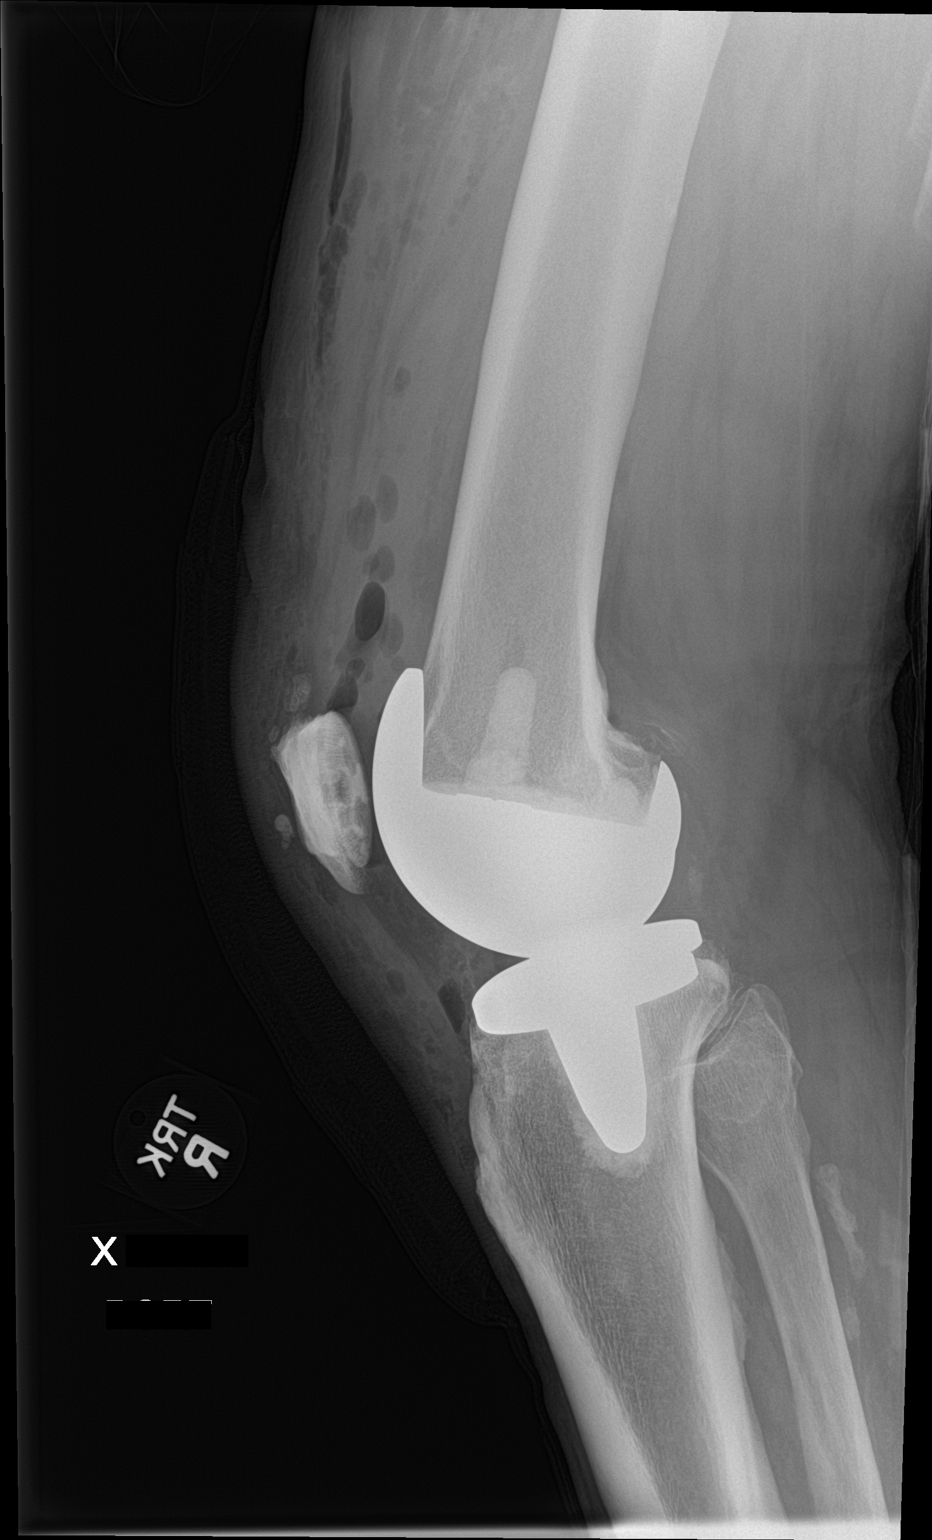

[2 of 2 positions shown; findings below may reference images not displayed]

FINDINGS: Frontal and lateral views were obtained. The patient is status post
total knee arthroplasty with prosthetic components well-seated. No
acute fracture or dislocation. There are several small
calcifications surrounding the patella, likely residua of old
trauma. There are foci of soft tissue air in the joint, an expected
postoperative finding.
IMPRESSION: Prosthetic components well-seated. No acute fracture or dislocation.
Several small calcifications in the peripatellar region, likely due
to old trauma.

## 2019-08-17 DIAGNOSIS — Z96612 Presence of left artificial shoulder joint: Secondary | ICD-10-CM | POA: Diagnosis not present

## 2019-08-17 DIAGNOSIS — Z471 Aftercare following joint replacement surgery: Secondary | ICD-10-CM | POA: Diagnosis not present

## 2019-08-17 DIAGNOSIS — M25511 Pain in right shoulder: Secondary | ICD-10-CM | POA: Diagnosis not present

## 2019-08-20 DIAGNOSIS — M19011 Primary osteoarthritis, right shoulder: Secondary | ICD-10-CM | POA: Insufficient documentation

## 2019-09-02 DIAGNOSIS — M25511 Pain in right shoulder: Secondary | ICD-10-CM | POA: Diagnosis not present

## 2019-09-07 ENCOUNTER — Encounter (HOSPITAL_COMMUNITY): Payer: Self-pay

## 2019-09-07 NOTE — Patient Instructions (Signed)
DUE TO COVID-19 ONLY ONE VISITOR IS ALLOWED TO COME WITH YOU AND STAY IN THE WAITING ROOM ONLY DURING PRE OP AND PROCEDURE DAY OF SURGERY. THE 1 VISITOR MAY VISIT WITH YOU AFTER SURGERY IN YOUR PRIVATE ROOM DURING VISITING HOURS ONLY!  YOU NEED TO HAVE A COVID 19 TEST ON_______ @_______ , THIS TEST MUST BE DONE BEFORE SURGERY, COME  [address], [place] , [id number].  [facility]) ONCE YOUR COVID TEST IS COMPLETED, PLEASE BEGIN THE QUARANTINE INSTRUCTIONS AS OUTLINED IN YOUR HANDOUT.                Todd Gertie ExonK Dornbush Jr.  09/07/2019   Your procedure is scheduled on: 09-16-19   [facility]   Report to admitting at       0730 AM     Call this number if you have problems the morning of surgery [phone]    Remember: NO SOLID FOOD AFTER MIDNIGHT THE NIGHT PRIOR TO SURGERY. NOTHING BY MOUTH EXCEPT CLEAR LIQUIDS UNTIL     0700 am . PLEASE FINISH ENSURE DRINK PER SURGEON ORDER  WHICH NEEDS TO BE COMPLETED AT       0700 am then nothing by mouth.    CLEAR LIQUID DIET   Foods Allowed                                                                     Foods Excluded  Coffee and tea, regular and decaf                             liquids that you cannot  Plain Jell-O any favor except red or purple                                           see through such as: Fruit ices (not with fruit pulp)                                     milk, soups, orange juice  Iced Popsicles                                    All solid food Carbonated beverages, regular and diet                                    Cranberry, grape and apple juices Sports drinks like Gatorade Lightly seasoned clear broth or consume(fat free) Sugar, honey syrup   _____________________________________________________________________    BRUSH YOUR TEETH MORNING OF SURGERY AND RINSE YOUR MOUTH OUT, NO CHEWING GUM CANDY OR MINTS.     Take these medicines the morning of  surgery with A SIP OF WATER: allegra  You may not have any metal on your body including hair pins and              piercings  Do not wear jewelry,lotions, powders or perfumes, deodorant              Men may shave face and neck.   Do not bring valuables to the hospital. [place] IS NOT             RESPONSIBLE   FOR VALUABLES.  Contacts, dentures or bridgework may not be worn into surgery.                 Please read over the following fact sheets you were given: _____________________________________________________________________          [facility] - Preparing for Surgery Before surgery, you can play an important role.  Because skin is not sterile, your skin needs to be as free of germs as possible.  You can reduce the number of germs on your skin by washing with CHG (chlorahexidine gluconate) soap before surgery.  CHG is an antiseptic cleaner which kills germs and bonds with the skin to continue killing germs even after washing. Please DO NOT use if you have an allergy to CHG or antibacterial soaps.  If your skin becomes reddened/irritated stop using the CHG and inform your nurse when you arrive at Short Stay. Do not shave (including legs and underarms) for at least 48 hours prior to the first CHG shower.  You may shave your face/neck. Please follow these instructions carefully:  1.  Shower with CHG Soap the night before surgery and the  morning of Surgery.  2.  If you choose to wash your hair, wash your hair first as usual with your  normal  shampoo.  3.  After you shampoo, rinse your hair and body thoroughly to remove the  shampoo.                           4.  Use CHG as you would any other liquid soap.  You can apply chg directly  to the skin and wash                       Gently with a scrungie or clean washcloth.  5.  Apply the CHG Soap to your body ONLY FROM THE NECK DOWN.   Do not use on face/ open                           Wound or open  sores. Avoid contact with eyes, ears mouth and genitals (private parts).                       Wash face,  Genitals (private parts) with your normal soap.             6.  Wash thoroughly, paying special attention to the area where your surgery  will be performed.  7.  Thoroughly rinse your body with warm water from the neck down.  8.  DO NOT shower/wash with your normal soap after using and rinsing off  the CHG Soap.                9.  Pat yourself dry with a clean towel.            10.  Wear clean pajamas.  11.  Place clean sheets on your bed the night of your first shower and do not  sleep with pets. Day of Surgery : Do not apply any lotions/deodorants the morning of surgery.  Please wear clean clothes to the hospital/surgery center.  FAILURE TO FOLLOW THESE INSTRUCTIONS MAY RESULT IN THE CANCELLATION OF YOUR SURGERY PATIENT SIGNATURE_________________________________  NURSE SIGNATURE__________________________________  ________________________________________________________________________   Todd Barrett  An incentive spirometer is a tool that can help keep your lungs clear and active. This tool measures how well you are filling your lungs with each breath. Taking long deep breaths may help reverse or decrease the chance of developing breathing (pulmonary) problems (especially infection) following:  A long period of time when you are unable to move or be active. BEFORE THE PROCEDURE   If the spirometer includes an indicator to show your best effort, your nurse or respiratory therapist will set it to a desired goal.  If possible, sit up straight or lean slightly forward. Try not to slouch.  Hold the incentive spirometer in an upright position. INSTRUCTIONS FOR USE  1. Sit on the edge of your bed if possible, or sit up as far as you can in bed or on a chair. 2. Hold the incentive spirometer in an upright position. 3. Breathe out normally. 4. Place the mouthpiece  in your mouth and seal your lips tightly around it. 5. Breathe in slowly and as deeply as possible, raising the piston or the ball toward the top of the column. 6. Hold your breath for 3-5 seconds or for as long as possible. Allow the piston or ball to fall to the bottom of the column. 7. Remove the mouthpiece from your mouth and breathe out normally. 8. Rest for a few seconds and repeat Steps 1 through 7 at least 10 times every 1-2 hours when you are awake. Take your time and take a few normal breaths between deep breaths. 9. The spirometer may include an indicator to show your best effort. Use the indicator as a goal to work toward during each repetition. 10. After each set of 10 deep breaths, practice coughing to be sure your lungs are clear. If you have an incision (the cut made at the time of surgery), support your incision when coughing by placing a pillow or rolled up towels firmly against it. Once you are able to get out of bed, walk around indoors and cough well. You may stop using the incentive spirometer when instructed by your caregiver.  RISKS AND COMPLICATIONS  Take your time so you do not get dizzy or light-headed.  If you are in pain, you may need to take or ask for pain medication before doing incentive spirometry. It is harder to take a deep breath if you are having pain. AFTER USE  Rest and breathe slowly and easily.  It can be helpful to keep track of a log of your progress. Your caregiver can provide you with a simple table to help with this. If you are using the spirometer at home, follow these instructions: [place] IF:   You are having difficultly using the spirometer.  You have trouble using the spirometer as often as instructed.  Your pain medication is not giving enough relief while using the spirometer.  You develop fever of 100.5 F (38.1 C) or higher. SEEK IMMEDIATE MEDICAL CARE IF:   You cough up bloody sputum that had not been present  before.  You develop fever of 102 F (38.9 C)  or greater.  You develop worsening pain at or near the incision site. MAKE SURE YOU:   Understand these instructions.  Will watch your condition.  Will get help right away if you are not doing well or get worse. Document Released: 03/24/2007 Document Revised: 02/03/2012 Document Reviewed: 05/25/2007 [facility] Patient Information 2014 [place], [place].   ________________________________________________________________________

## 2019-09-08 ENCOUNTER — Encounter (HOSPITAL_COMMUNITY): Payer: Self-pay

## 2019-09-08 ENCOUNTER — Other Ambulatory Visit: Payer: Self-pay

## 2019-09-08 ENCOUNTER — Encounter (HOSPITAL_COMMUNITY)
Admission: RE | Admit: 2019-09-08 | Discharge: 2019-09-08 | Disposition: A | Discharge status: Home / Self Care | Payer: Medicare Other | Source: Ambulatory Visit | Attending: Orthopedic Surgery | Admitting: Orthopedic Surgery

## 2019-09-08 DIAGNOSIS — Z01812 Encounter for preprocedural laboratory examination: Secondary | ICD-10-CM | POA: Diagnosis not present

## 2019-09-08 DIAGNOSIS — Z01810 Encounter for preprocedural cardiovascular examination: Secondary | ICD-10-CM | POA: Diagnosis not present

## 2019-09-08 HISTORY — DX: Cardiac arrhythmia, unspecified: I49.9

## 2019-09-08 LAB — BASIC METABOLIC PANEL
Anion gap: 10 (ref 5–15)
BUN: 23 mg/dL (ref 8–23)
CO2: 23 mmol/L (ref 22–32)
Calcium: 9 mg/dL (ref 8.9–10.3)
Chloride: 106 mmol/L (ref 98–111)
Creatinine, Ser: 0.93 mg/dL (ref 0.61–1.24)
GFR calc Af Amer: >60 mL/min (ref >60)
GFR calc non Af Amer: >60 mL/min (ref >60)
Glucose, Bld: 93 mg/dL (ref 70–99)
Potassium: 4.3 mmol/L (ref 3.5–5.1)
Sodium: 139 mmol/L (ref 135–145)

## 2019-09-08 LAB — CBC
HCT: 43.1 % (ref 39.0–52.0)
Hemoglobin: 14.4 g/dL (ref 13.0–17.0)
MCH: 30.2 pg (ref 26.0–34.0)
MCHC: 33.4 g/dL (ref 30.0–36.0)
MCV: 90.4 fL (ref 80.0–100.0)
Platelets: 184 10*3/uL (ref 150–400)
RBC: 4.77 MIL/uL (ref 4.22–5.81)
RDW: 12.6 % (ref 11.5–15.5)
WBC: 4.8 10*3/uL (ref 4.0–10.5)
nRBC: 0 % (ref 0.0–0.2)

## 2019-09-08 LAB — SURGICAL PCR SCREEN
MRSA, PCR: NEGATIVE
Staphylococcus aureus: NEGATIVE

## 2019-09-08 NOTE — Progress Notes (Signed)
PCP Cathlean Cower Cardiologist -   Chest x-ray -  EKG - final EKG 09-08-19 epic Stress Test -  ECHO -  Cardiac Cath -   Sleep Study -  CPAP -   Fasting Blood Sugar -  Checks Blood Sugar _____ times a day  Blood Thinner Instructions: Aspirin Instructions: Last Dose:  Anesthesia review:   Patient denies shortness of breath, fever, cough and chest pain at PAT appointment   NONE   Patient verbalized understanding of instructions that were given to them at the PAT appointment. Patient was also instructed that they will need to review over the PAT instructions again at home before surgery.

## 2019-09-13 ENCOUNTER — Other Ambulatory Visit (HOSPITAL_COMMUNITY)
Admission: RE | Admit: 2019-09-13 | Discharge: 2019-09-13 | Disposition: A | Discharge status: Home / Self Care | Payer: Medicare Other | Source: Ambulatory Visit | Attending: Orthopedic Surgery | Admitting: Orthopedic Surgery

## 2019-09-14 LAB — NOVEL CORONAVIRUS, NAA (HOSP ORDER, SEND-OUT TO REF LAB; TAT 18-24 HRS): SARS-CoV-2, NAA: NOT DETECTED

## 2019-09-15 ENCOUNTER — Encounter (HOSPITAL_COMMUNITY): Payer: Self-pay | Admitting: Anesthesiology

## 2019-09-15 NOTE — Anesthesia Preprocedure Evaluation (Addendum)
Anesthesia Evaluation  Patient identified by MRN, date of birth, ID band Patient awake    Reviewed: Allergy & Precautions, NPO status , Patient's Chart, lab work & pertinent test results  History of Anesthesia Complications Negative for: history of anesthetic complications  Airway Mallampati: II      Comment: Hx cervical spine fusion, limited extension Dental no notable dental hx. (+) Teeth Intact   Pulmonary neg pulmonary ROS,    Pulmonary exam normal breath sounds clear to auscultation       Cardiovascular Exercise Tolerance: Good Normal cardiovascular exam+ dysrhythmias Atrial Fibrillation  Rhythm:Regular Rate:Normal     Neuro/Psych Anxiety    GI/Hepatic Neg liver ROS, GERD  ,  Endo/Other  negative endocrine ROS  Renal/GU negative Renal ROS  negative genitourinary   Musculoskeletal  (+) Arthritis , Osteoarthritis,    Abdominal (+) + obese,   Peds  Hematology   Anesthesia Other Findings   Reproductive/Obstetrics                            Anesthesia Physical  Anesthesia Plan  ASA: III  Anesthesia Plan: General   Post-op Pain Management:  Regional for Post-op pain   Induction: Intravenous  PONV Risk Score and Plan: 2 and Ondansetron, Midazolam, Treatment may vary due to age or medical condition and Dexamethasone  Airway Management Planned: Oral ETT  Additional Equipment: None  Intra-op Plan:   Post-operative Plan: Extubation in OR  Informed Consent: I have reviewed the patients History and Physical, chart, labs and discussed the procedure including the risks, benefits and alternatives for the proposed anesthesia with the patient or authorized representative who has indicated his/her understanding and acceptance.     Dental advisory given  Plan Discussed with: CRNA  Anesthesia Plan Comments:        Anesthesia Quick Evaluation

## 2019-09-16 ENCOUNTER — Encounter (HOSPITAL_COMMUNITY): Payer: Self-pay

## 2019-09-16 ENCOUNTER — Other Ambulatory Visit: Payer: Self-pay

## 2019-09-16 ENCOUNTER — Inpatient Hospital Stay (HOSPITAL_COMMUNITY): Payer: Medicare Other | Admitting: Anesthesiology

## 2019-09-16 ENCOUNTER — Encounter (HOSPITAL_COMMUNITY)
Admission: RE | Disposition: A | Discharge status: Home / Self Care | Payer: Self-pay | Source: Home / Self Care | Attending: Orthopedic Surgery

## 2019-09-16 ENCOUNTER — Inpatient Hospital Stay (HOSPITAL_COMMUNITY): Payer: Medicare Other | Admitting: Physician Assistant

## 2019-09-16 ENCOUNTER — Inpatient Hospital Stay (HOSPITAL_COMMUNITY)
Admission: RE | Admit: 2019-09-16 | Discharge: 2019-09-17 | DRG: 483 | Disposition: A | Discharge status: Home / Self Care | Payer: Medicare Other | Attending: Orthopedic Surgery | Admitting: Orthopedic Surgery

## 2019-09-16 DIAGNOSIS — Z79899 Other long term (current) drug therapy: Secondary | ICD-10-CM | POA: Diagnosis not present

## 2019-09-16 DIAGNOSIS — Z820 Family history of epilepsy and other diseases of the nervous system: Secondary | ICD-10-CM | POA: Diagnosis not present

## 2019-09-16 DIAGNOSIS — Z96611 Presence of right artificial shoulder joint: Secondary | ICD-10-CM

## 2019-09-16 DIAGNOSIS — Z807 Family history of other malignant neoplasms of lymphoid, hematopoietic and related tissues: Secondary | ICD-10-CM | POA: Diagnosis not present

## 2019-09-16 DIAGNOSIS — Z9889 Other specified postprocedural states: Secondary | ICD-10-CM | POA: Insufficient documentation

## 2019-09-16 DIAGNOSIS — E785 Hyperlipidemia, unspecified: Secondary | ICD-10-CM | POA: Diagnosis present

## 2019-09-16 DIAGNOSIS — M75101 Unspecified rotator cuff tear or rupture of right shoulder, not specified as traumatic: Principal | ICD-10-CM | POA: Diagnosis present

## 2019-09-16 DIAGNOSIS — Z20828 Contact with and (suspected) exposure to other viral communicable diseases: Secondary | ICD-10-CM | POA: Diagnosis present

## 2019-09-16 DIAGNOSIS — Z7951 Long term (current) use of inhaled steroids: Secondary | ICD-10-CM | POA: Diagnosis not present

## 2019-09-16 DIAGNOSIS — M19011 Primary osteoarthritis, right shoulder: Secondary | ICD-10-CM | POA: Diagnosis present

## 2019-09-16 DIAGNOSIS — M109 Gout, unspecified: Secondary | ICD-10-CM | POA: Diagnosis present

## 2019-09-16 DIAGNOSIS — G8918 Other acute postprocedural pain: Secondary | ICD-10-CM | POA: Diagnosis not present

## 2019-09-16 DIAGNOSIS — I4891 Unspecified atrial fibrillation: Secondary | ICD-10-CM | POA: Diagnosis not present

## 2019-09-16 HISTORY — PX: TOTAL SHOULDER ARTHROPLASTY: SHX126

## 2019-09-16 SURGERY — ARTHROPLASTY, SHOULDER, TOTAL
Anesthesia: General | Site: Shoulder | Laterality: Right

## 2019-09-16 MED ORDER — ONDANSETRON HCL 4 MG PO TABS: 4.0000 mg | ORAL_TABLET | Freq: Four times a day (QID) | ORAL | Status: DC | PRN

## 2019-09-16 MED ORDER — PHENOL 1.4 % MT LIQD: 1.0000 | OROMUCOSAL | Status: DC | PRN

## 2019-09-16 MED ORDER — PROPOFOL 10 MG/ML IV BOLUS
INTRAVENOUS | Status: AC
  Filled 2019-09-16: qty 20

## 2019-09-16 MED ORDER — FENTANYL CITRATE (PF) 250 MCG/5ML IJ SOLN
INTRAMUSCULAR | Status: DC | PRN
  Administered 2019-09-16: 50 ug via INTRAVENOUS
  Administered 2019-09-16: 100 ug via INTRAVENOUS

## 2019-09-16 MED ORDER — HYDROMORPHONE HCL 1 MG/ML IJ SOLN: 0.2500 mg | INTRAMUSCULAR | Status: DC | PRN

## 2019-09-16 MED ORDER — MIDAZOLAM HCL 2 MG/2ML IJ SOLN
INTRAMUSCULAR | Status: AC
  Filled 2019-09-16: qty 2

## 2019-09-16 MED ORDER — ONDANSETRON HCL 4 MG/2ML IJ SOLN
INTRAMUSCULAR | Status: DC | PRN
  Administered 2019-09-16: 4 mg via INTRAVENOUS

## 2019-09-16 MED ORDER — ONDANSETRON HCL 4 MG/2ML IJ SOLN: 4.0000 mg | Freq: Four times a day (QID) | INTRAMUSCULAR | Status: DC | PRN

## 2019-09-16 MED ORDER — LACTATED RINGERS IV SOLN
INTRAVENOUS | Status: DC
  Administered 2019-09-16 (×2): via INTRAVENOUS

## 2019-09-16 MED ORDER — ACETAMINOPHEN 325 MG PO TABS: 325.0000 mg | ORAL_TABLET | Freq: Four times a day (QID) | ORAL | Status: DC | PRN

## 2019-09-16 MED ORDER — METOCLOPRAMIDE HCL 5 MG PO TABS: 5.0000 mg | ORAL_TABLET | Freq: Three times a day (TID) | ORAL | Status: DC | PRN

## 2019-09-16 MED ORDER — PROMETHAZINE HCL 25 MG/ML IJ SOLN: 6.2500 mg | INTRAMUSCULAR | Status: DC | PRN

## 2019-09-16 MED ORDER — MIDAZOLAM HCL 2 MG/2ML IJ SOLN
INTRAMUSCULAR | Status: DC | PRN
  Administered 2019-09-16: 2 mg via INTRAVENOUS

## 2019-09-16 MED ORDER — BUPIVACAINE LIPOSOME 1.3 % IJ SUSP
INTRAMUSCULAR | Status: DC | PRN
  Administered 2019-09-16 (×5): 2 mL via PERINEURAL

## 2019-09-16 MED ORDER — STERILE WATER FOR IRRIGATION IR SOLN
Status: DC | PRN
  Administered 2019-09-16: 2000 mL

## 2019-09-16 MED ORDER — METHOCARBAMOL 500 MG IVPB - SIMPLE MED
500.0000 mg | Freq: Four times a day (QID) | INTRAVENOUS | Status: DC | PRN
  Filled 2019-09-16: qty 50

## 2019-09-16 MED ORDER — CHLORHEXIDINE GLUCONATE 4 % EX LIQD: 60.0000 mL | Freq: Once | CUTANEOUS | Status: DC

## 2019-09-16 MED ORDER — TRANEXAMIC ACID-NACL 1000-0.7 MG/100ML-% IV SOLN
1000.0000 mg | INTRAVENOUS | Status: AC
  Administered 2019-09-16: 1000 mg via INTRAVENOUS
  Filled 2019-09-16: qty 100

## 2019-09-16 MED ORDER — FENTANYL CITRATE (PF) 250 MCG/5ML IJ SOLN
INTRAMUSCULAR | Status: AC
  Filled 2019-09-16: qty 5

## 2019-09-16 MED ORDER — EPHEDRINE SULFATE 50 MG/ML IJ SOLN
INTRAMUSCULAR | Status: DC | PRN
  Administered 2019-09-16: 10 mg via INTRAVENOUS

## 2019-09-16 MED ORDER — PANTOPRAZOLE SODIUM 40 MG PO TBEC
40.0000 mg | DELAYED_RELEASE_TABLET | Freq: Every day | ORAL | Status: DC
  Administered 2019-09-16 – 2019-09-17 (×2): 40 mg via ORAL
  Filled 2019-09-16 (×2): qty 1

## 2019-09-16 MED ORDER — LACTATED RINGERS IV SOLN
INTRAVENOUS | Status: DC
  Administered 2019-09-16 (×2): via INTRAVENOUS

## 2019-09-16 MED ORDER — MEPERIDINE HCL 50 MG/ML IJ SOLN: 6.2500 mg | INTRAMUSCULAR | Status: DC | PRN

## 2019-09-16 MED ORDER — TEMAZEPAM 15 MG PO CAPS: 15.0000 mg | ORAL_CAPSULE | Freq: Every evening | ORAL | Status: DC | PRN

## 2019-09-16 MED ORDER — CEFAZOLIN SODIUM-DEXTROSE 2-4 GM/100ML-% IV SOLN
2.0000 g | INTRAVENOUS | Status: AC
  Administered 2019-09-16: 08:00:00 2 g via INTRAVENOUS
  Filled 2019-09-16: qty 100

## 2019-09-16 MED ORDER — DIPHENHYDRAMINE HCL 12.5 MG/5ML PO ELIX: 12.5000 mg | ORAL_SOLUTION | ORAL | Status: DC | PRN

## 2019-09-16 MED ORDER — KETOROLAC TROMETHAMINE 30 MG/ML IJ SOLN: 30.0000 mg | Freq: Once | INTRAMUSCULAR | Status: DC | PRN

## 2019-09-16 MED ORDER — MENTHOL 3 MG MT LOZG: 1.0000 | LOZENGE | OROMUCOSAL | Status: DC | PRN

## 2019-09-16 MED ORDER — OXYCODONE HCL 5 MG PO TABS
5.0000 mg | ORAL_TABLET | ORAL | Status: DC | PRN
  Administered 2019-09-16 – 2019-09-17 (×2): 5 mg via ORAL
  Filled 2019-09-16 (×2): qty 1

## 2019-09-16 MED ORDER — METHOCARBAMOL 500 MG PO TABS
500.0000 mg | ORAL_TABLET | Freq: Four times a day (QID) | ORAL | Status: DC | PRN
  Administered 2019-09-16: 18:00:00 500 mg via ORAL
  Filled 2019-09-16: qty 1

## 2019-09-16 MED ORDER — BUPIVACAINE-EPINEPHRINE (PF) 0.5% -1:200000 IJ SOLN
INTRAMUSCULAR | Status: DC | PRN
  Administered 2019-09-16 (×5): 3 mL via PERINEURAL

## 2019-09-16 MED ORDER — DOCUSATE SODIUM 100 MG PO CAPS
100.0000 mg | ORAL_CAPSULE | Freq: Two times a day (BID) | ORAL | Status: DC
  Administered 2019-09-16 – 2019-09-17 (×2): 100 mg via ORAL
  Filled 2019-09-16 (×3): qty 1

## 2019-09-16 MED ORDER — ALUM & MAG HYDROXIDE-SIMETH 200-200-20 MG/5ML PO SUSP: 30.0000 mL | ORAL | Status: DC | PRN

## 2019-09-16 MED ORDER — HYDROMORPHONE HCL 1 MG/ML IJ SOLN: 0.5000 mg | INTRAMUSCULAR | Status: DC | PRN

## 2019-09-16 MED ORDER — OXYCODONE HCL 5 MG PO TABS: 10.0000 mg | ORAL_TABLET | ORAL | Status: DC | PRN

## 2019-09-16 MED ORDER — METOCLOPRAMIDE HCL 5 MG/ML IJ SOLN: 5.0000 mg | Freq: Three times a day (TID) | INTRAMUSCULAR | Status: DC | PRN

## 2019-09-16 MED ORDER — BISACODYL 5 MG PO TBEC: 5.0000 mg | DELAYED_RELEASE_TABLET | Freq: Every day | ORAL | Status: DC | PRN

## 2019-09-16 MED ORDER — LIDOCAINE 2% (20 MG/ML) 5 ML SYRINGE
INTRAMUSCULAR | Status: DC | PRN
  Administered 2019-09-16: 100 mg via INTRAVENOUS

## 2019-09-16 MED ORDER — ROCURONIUM BROMIDE 10 MG/ML (PF) SYRINGE
PREFILLED_SYRINGE | INTRAVENOUS | Status: DC | PRN
  Administered 2019-09-16: 10 mg via INTRAVENOUS
  Administered 2019-09-16: 20 mg via INTRAVENOUS
  Administered 2019-09-16: 10 mg via INTRAVENOUS
  Administered 2019-09-16: 40 mg via INTRAVENOUS

## 2019-09-16 MED ORDER — SUGAMMADEX SODIUM 500 MG/5ML IV SOLN
INTRAVENOUS | Status: DC | PRN
  Administered 2019-09-16: 500 mg via INTRAVENOUS

## 2019-09-16 MED ORDER — POLYETHYLENE GLYCOL 3350 17 G PO PACK: 17.0000 g | PACK | Freq: Every day | ORAL | Status: DC | PRN

## 2019-09-16 MED ORDER — 0.9 % SODIUM CHLORIDE (POUR BTL) OPTIME
TOPICAL | Status: DC | PRN
  Administered 2019-09-16: 1000 mL

## 2019-09-16 MED ORDER — KETOROLAC TROMETHAMINE 15 MG/ML IJ SOLN
15.0000 mg | Freq: Four times a day (QID) | INTRAMUSCULAR | Status: AC
  Administered 2019-09-16 – 2019-09-17 (×4): 15 mg via INTRAVENOUS
  Filled 2019-09-16 (×4): qty 1

## 2019-09-16 MED ORDER — PROPOFOL 10 MG/ML IV BOLUS
INTRAVENOUS | Status: DC | PRN
  Administered 2019-09-16: 200 mg via INTRAVENOUS

## 2019-09-16 MED ORDER — DEXAMETHASONE SODIUM PHOSPHATE 10 MG/ML IJ SOLN
INTRAMUSCULAR | Status: DC | PRN
  Administered 2019-09-16: 10 mg via INTRAVENOUS

## 2019-09-16 MED ORDER — SUCCINYLCHOLINE CHLORIDE 200 MG/10ML IV SOSY
PREFILLED_SYRINGE | INTRAVENOUS | Status: DC | PRN
  Administered 2019-09-16: 100 mg via INTRAVENOUS

## 2019-09-16 MED ORDER — MAGNESIUM CITRATE PO SOLN: 1.0000 | Freq: Once | ORAL | Status: DC | PRN

## 2019-09-16 SURGICAL SUPPLY — 66 items
BAG ZIPLOCK 12X15 (MISCELLANEOUS) ×3 IMPLANT
BIT DRILL 2.0X128 (BIT) ×2 IMPLANT
BIT DRILL 2.0X128MM (BIT) ×1
BLADE SAW SGTL 83.5X18.5 (BLADE) ×3 IMPLANT
COOLER ICEMAN CLASSIC (MISCELLANEOUS) ×3 IMPLANT
COVER BACK TABLE 60X90IN (DRAPES) ×3 IMPLANT
COVER SURGICAL LIGHT HANDLE (MISCELLANEOUS) ×3 IMPLANT
COVER WAND RF STERILE (DRAPES) IMPLANT
CUP SUT UNIV REVERS 39 NEU (Shoulder) ×3 IMPLANT
DERMABOND ADVANCED (GAUZE/BANDAGES/DRESSINGS) ×2
DERMABOND ADVANCED .7 DNX12 (GAUZE/BANDAGES/DRESSINGS) ×1 IMPLANT
DRAPE ORTHO SPLIT 77X108 STRL (DRAPES) ×4
DRAPE SHEET LG 3/4 BI-LAMINATE (DRAPES) ×3 IMPLANT
DRAPE SURG 17X11 SM STRL (DRAPES) ×3 IMPLANT
DRAPE SURG ORHT 6 SPLT 77X108 (DRAPES) ×2 IMPLANT
DRAPE U-SHAPE 47X51 STRL (DRAPES) ×3 IMPLANT
DRSG AQUACEL AG ADV 3.5X10 (GAUZE/BANDAGES/DRESSINGS) ×3 IMPLANT
DURAPREP 26ML APPLICATOR (WOUND CARE) ×6 IMPLANT
ELECT BLADE TIP CTD 4 INCH (ELECTRODE) ×3 IMPLANT
ELECT REM PT RETURN 15FT ADLT (MISCELLANEOUS) ×3 IMPLANT
FACESHIELD WRAPAROUND (MASK) ×15 IMPLANT
GLENOID UNI REV MOD 24 +2 LAT (Joint) ×3 IMPLANT
GLENOSPHERE 39+4 LAT/24 UNI RV (Joint) ×3 IMPLANT
GLOVE BIO SURGEON STRL SZ7.5 (GLOVE) ×3 IMPLANT
GLOVE BIO SURGEON STRL SZ8 (GLOVE) ×3 IMPLANT
GLOVE SS BIOGEL STRL SZ 7 (GLOVE) ×1 IMPLANT
GLOVE SS BIOGEL STRL SZ 7.5 (GLOVE) ×1 IMPLANT
GLOVE SUPERSENSE BIOGEL SZ 7 (GLOVE) ×2
GLOVE SUPERSENSE BIOGEL SZ 7.5 (GLOVE) ×2
GOWN STRL REUS W/TWL LRG LVL3 (GOWN DISPOSABLE) ×9 IMPLANT
INSERT HUMERAL 39/+6 (Insert) ×3 IMPLANT
KIT BASIN OR (CUSTOM PROCEDURE TRAY) ×3 IMPLANT
KIT TURNOVER KIT A (KITS) IMPLANT
MANIFOLD NEPTUNE II (INSTRUMENTS) ×3 IMPLANT
MARKER SKIN DUAL TIP RULER LAB (MISCELLANEOUS) ×3 IMPLANT
NEEDLE TAPERED W/ NITINOL LOOP (MISCELLANEOUS) ×3 IMPLANT
NS IRRIG 1000ML POUR BTL (IV SOLUTION) ×3 IMPLANT
PACK SHOULDER (CUSTOM PROCEDURE TRAY) ×3 IMPLANT
PAD COLD SHLDR WRAP-ON (PAD) ×3 IMPLANT
PIN SET MODULAR GLENOID SYSTEM (PIN) ×3 IMPLANT
PROTECTOR NERVE ULNAR (MISCELLANEOUS) ×3 IMPLANT
RESTRAINT HEAD UNIVERSAL NS (MISCELLANEOUS) ×3 IMPLANT
SCREW CENTRAL MOD 30MM (Screw) ×3 IMPLANT
SCREW PERI LOCK 5.5X24 (Screw) ×3 IMPLANT
SCREW PERI LOCK 5.5X36 (Screw) ×3 IMPLANT
SCREW PERIPHERAL 5.5X20 LOCK (Screw) ×3 IMPLANT
SCREW PERIPHERAL 5.5X28 LOCK (Screw) ×3 IMPLANT
SLING ARM FOAM STRAP LRG (SOFTGOODS) ×3 IMPLANT
SLING ARM FOAM STRAP MED (SOFTGOODS) IMPLANT
SMARTMIX MINI TOWER (MISCELLANEOUS)
SPONGE LAP 18X18 RF (DISPOSABLE) IMPLANT
STEM HUMERAL UNI REVERSE SZ10 (Stem) ×3 IMPLANT
SUCTION FRAZIER HANDLE 12FR (TUBING) ×2
SUCTION TUBE FRAZIER 12FR DISP (TUBING) ×1 IMPLANT
SUT FIBERWIRE #2 38 T-5 BLUE (SUTURE)
SUT MNCRL AB 3-0 PS2 18 (SUTURE) ×3 IMPLANT
SUT MON AB 2-0 CT1 36 (SUTURE) ×3 IMPLANT
SUT VIC AB 1 CT1 36 (SUTURE) ×9 IMPLANT
SUTURE FIBERWR #2 38 T-5 BLUE (SUTURE) IMPLANT
SUTURE TAPE 1.3 40 TPR END (SUTURE) ×4 IMPLANT
SUTURETAPE 1.3 40 TPR END (SUTURE) ×12
TOWEL OR 17X26 10 PK STRL BLUE (TOWEL DISPOSABLE) ×3 IMPLANT
TOWEL OR NON WOVEN STRL DISP B (DISPOSABLE) ×3 IMPLANT
TOWER SMARTMIX MINI (MISCELLANEOUS) IMPLANT
WATER STERILE IRR 1000ML POUR (IV SOLUTION) ×6 IMPLANT
YANKAUER SUCT BULB TIP 10FT TU (MISCELLANEOUS) ×3 IMPLANT

## 2019-09-16 NOTE — Anesthesia Procedure Notes (Signed)
Procedure Name: Intubation Date/Time: 09/16/2019 7:44 AM Performed by: Pilar Grammes, CRNA Pre-anesthesia Checklist: Patient identified, Emergency Drugs available, Suction available, Patient being monitored and Timeout performed Patient Re-evaluated:Patient Re-evaluated prior to induction Oxygen Delivery Method: Circle system utilized Preoxygenation: Pre-oxygenation with 100% oxygen Induction Type: IV induction Ventilation: Mask ventilation without difficulty Laryngoscope Size: 3, Glidescope and Mac Grade View: Grade I Tube type: Oral Tube size: 7.5 mm Number of attempts: 1 Airway Equipment and Method: Stylet Placement Confirmation: positive ETCO2,  ETT inserted through vocal cords under direct vision,  CO2 detector and breath sounds checked- equal and bilateral Secured at: 22 cm Tube secured with: Tape Dental Injury: Teeth and Oropharynx as per pre-operative assessment

## 2019-09-16 NOTE — Discharge Instructions (Signed)
° °Kevin M. Supple, M.D., F.A.A.O.S. °Orthopaedic Surgery °Specializing in Arthroscopic and Reconstructive °Surgery of the Shoulder °[phone] °[address] - [place], [place] [address] - Fax [phone] ° ° °POST-OP TOTAL SHOULDER REPLACEMENT INSTRUCTIONS ° °1. Call the office at [phone] to schedule your first post-op appointment 10-14 days from the date of your surgery. ° °2. The bandage over your incision is waterproof. You may begin showering with this dressing on. You may leave this dressing on until first follow up appointment within 2 weeks. We prefer you leave this dressing in place until follow up however after 5-7 days if you are having itching or skin irritation and would like to remove it you may do so. Go slow and tug at the borders gently to break the bond the dressing has with the skin. At this point if there is no drainage it is okay to go without a bandage or you may cover it with a light guaze and tape. You can also expect significant bruising around your shoulder that will drift down your arm and into your chest wall. This is very normal and should resolve over several days. ° ° 3. Wear your sling/immobilizer at all times except to perform the exercises below or to occasionally let your arm dangle by your side to stretch your elbow. You also need to sleep in your sling immobilizer until instructed otherwise. It is ok to remove your sling if you are sitting in a controlled environment and allow your arm to rest in a position of comfort by your side or on your lap with pillows to give your neck and skin a break from the sling. You may remove it to allow arm to dangle by side to shower. If you are up walking around and when you go to sleep at night you need to wear it. ° °4. Range of motion to your elbow, wrist, and hand are encouraged 3-5 times daily. Exercise to your hand and fingers helps to reduce swelling you may experience. ° °5. Utilize ice to the shoulder 3-5 times  minimum a day and additionally if you are experiencing pain. ° °6. Prescriptions for a pain medication and a muscle relaxant are provided for you. It is recommended that if you are experiencing pain that you pain medication alone is not controlling, add the muscle relaxant along with the pain medication which can give additional pain relief. The first 1-2 days is generally the most severe of your pain and then should gradually decrease. As your pain lessens it is recommended that you decrease your use of the pain medications to an "as needed basis'" only and to always comply with the recommended dosages of the pain medications. ° °7. Pain medications can produce constipation along with their use. If you experience this, the use of an over the counter stool softener or laxative daily is recommended.  ° °8. For additional questions or concerns, please do not hesitate to call the office. If after hours there is an answering service to forward your concerns to the physician on call. ° °9.Pain control following an exparel block ° °To help control your post-operative pain you received a nerve block  performed with Exparel which is a long acting anesthetic (numbing agent) which can provide pain relief and sensations of numbness (and relief of pain) in the operative shoulder and arm for up to 3 days. Sometimes it provides mixed relief, meaning you may still have numbness in certain areas of the arm but can still   be able to move  parts of that arm, hand, and fingers. We recommend that your prescribed pain medications  be used as needed. We do not feel it is necessary to "pre medicate" and "stay ahead" of pain.  Taking narcotic pain medications when you are not having any pain can lead to unnecessary and potentially dangerous side effects.   ° °10. Use the ice machine as much as possible in the first 5-7 days from surgery, then you can wean its use to as needed. The ice typically needs to be replaced every 6 hours, instead of  ice you can actually freeze water bottles to put in the cooler and then fill water around them to avoid having to purchase ice. You can have spare water bottles freezing to allow you to rotate them once they have melted. Try to have a thin shirt or light cloth or towel under the ice wrap to protect your skin.  ° °11.  We recommend that you avoid any dental work or cleaning in the first 3 months following your joint replacement. This is to help minimize the possibility of infection from the bacteria in your mouth that enters your bloodstream during dental work. We also recommend that you take an antibiotic prior to your dental work for the first year after your shoulder replacement to further help reduce that risk. Please simply contact our office for antibiotics to be sent to your pharmacy prior to dental work. ° °POST-OP EXERCISES ° °Pendulum Exercises ° °Perform pendulum exercises while standing and bending at the waist. Support your uninvolved arm on a table or chair and allow your operated arm to hang freely. Make sure to do these exercises passively - not using you shoulder muscles. These exercises can be performed once your nerve block effects have worn off. ° °Repeat 20 times. Do 3 sessions per day. ° ° ° ° °

## 2019-09-16 NOTE — Addendum Note (Signed)
Addendum  created 09/16/19 1032 by Pilar Grammes, CRNA   Intraprocedure Meds edited

## 2019-09-16 NOTE — Anesthesia Procedure Notes (Signed)
Anesthesia Regional Block: Interscalene brachial plexus block   Pre-Anesthetic Checklist: ,, timeout performed, Correct Patient, Correct Site, Correct Laterality, Correct Procedure, Correct Position, site marked, Risks and benefits discussed,  Surgical consent,  Pre-op evaluation,  At surgeon's request and post-op pain management  Laterality: Right and Upper  Prep: chloraprep       Needles:  Injection technique: Single-shot  Needle Type: Echogenic Stimulator Needle     Needle Length: 10cm  Needle Gauge: 21   Needle insertion depth: 1 cm   Additional Needles:   Procedures:,,,, ultrasound used (permanent image in chart),,,,  Narrative:  Start time: 09/16/2019 6:55 AM End time: 09/16/2019 7:05 AM Injection made incrementally with aspirations every 5 mL.  Performed by: Personally  Anesthesiologist: Lyn Hollingshead, MD

## 2019-09-16 NOTE — Transfer of Care (Signed)
Immediate Anesthesia Transfer of Care Note  Patient: Todd Barrett.  Procedure(s) Performed: Right Reverse shoulder arthroplasty (Right Shoulder)  Patient Location: PACU  Anesthesia Type:General  Level of Consciousness: awake, oriented, drowsy and patient cooperative  Airway & Oxygen Therapy: Patient Spontanous Breathing and Patient connected to nasal cannula oxygen  Post-op Assessment: Report given to RN and Post -op Vital signs reviewed and stable  Post vital signs: stable  Last Vitals:  Vitals Value Taken Time  BP 133/84 09/16/19 0932  Temp    Pulse 64 09/16/19 0935  Resp 20 09/16/19 0935  SpO2 100 % 09/16/19 0935  Vitals shown include unvalidated device data.  Last Pain:  Vitals:   09/16/19 0615  TempSrc:   PainSc: 0-No pain         Complications: No apparent anesthesia complications

## 2019-09-16 NOTE — Op Note (Addendum)
09/16/2019  9:22 AM  PATIENT:   Todd Barrett.  68 y.o. 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  ASSISTANTS: Jenetta Loges, PA-C  ANESTHESIA:   General endotracheal and interscalene block with Exparel around  EBL: 200 cc  SPECIMEN: None  Drains: None   PATIENT DISPOSITION:  PACU - hemodynamically stable.    PLAN OF CARE: Admit for overnight observation  Brief history:  Todd Barrett is a 68 year old gentleman well-known to our practice after previous treatment for left shoulder end stage rotator cuff tear arthropathy for which she is done very well with a reverse shoulder arthroplasty.  He is now being seen for end stage right shoulder rotator cuff tear arthropathy and is brought to the operating room today for planned right shoulder reverse arthroplasty  Preoperatively I did counseled Todd Barrett regarding treatment options as well as the potential risks versus benefits thereof.  Possible surgical complications were reviewed including bleeding, infection, neurovascular injury, persistence of pain, loss of motion, anesthetic complication, failure the implant, and possible need for additional surgery.  He understands, and accepts, and agrees with our planned procedure.  Procedure in detail:  After undergoing routine preop evaluation patient received prophylactic antibiotics and interscalene block with Exparel was established in the holding area by the anesthesia department.  Subsequently placed supine on the operating table and underwent the smooth induction of a general endotracheal anesthesia.  Placed into the beachchair position and appropriately padded and  protected.  The right shoulder girdle region was sterilely prepped and draped in standard fashion.  Timeout was called.  An anterior deltopectoral approach to the right shoulder was made through 10 cm incision.  Skin flaps elevated dissection carried deeply the deltopectoral interval was then developed from proximal to distal with the vein taken laterally.  The long head biceps tendon was then tenodesed at the upper border of the pec major and the proximal segment was then unroofed and excised.  The subscapularis was then divided from the lesser tuberosity and the free margin tagged with a pair of suture tape sutures.  Capsular attachments were then divided from the anterior and infra margins of the humeral neck and humeral head was then delivered through the wound.  Extra medullary guide was then used to outline our proposed humeral head resection which was then performed with an oscillating saw matching the native retroversion of approximate 25 degrees.  Metal cap placed over the cut proximal humeral surface and at this point the glenoid was then exposed with appropriate retractors and a circumferential labral resection was completed gaining visualization of the entirety of the glenoid.  A guidepin was then directed into the center of the glenoid with an approximately 10 degree inferior tilt and the glenoid was then prepared with a central followed by peripheral reamers to a stable subchondral bony bed.  Terminal preparation was completed with the central drill and then tapped and the final baseplate was then inserted with a 30 mm lag screw with excellent seating and then the peripheral locking screws were all then placed with excellent fit and fixation.  A 39/+4 glenosphere was then impacted onto the baseplate and the central locking screw was then applied with excellent fixation.  At this point we returned our  attention to the proximal humerus where the canal was prepared hand reaming and then broaching up to  size 10 and then metaphysis was then prepared a trial implant placed and a trial reduction showed excellent fit and fixation with good soft tissue balance.  Our trial was then removed the final implant was then assembled on the back table and the implant was then impacted into the humeral canal with excellent fit and fixation.  Trial reductions were then completed and ultimately felt that the +6 polyethylene insert gave [place] the best soft tissue balance and good stability and good motion.  The trial polywas removed and removed the final poly with an impacted final reduction was then performed and very pleased with the overall soft tissue balance and stability.  The wound was copiously irrigated.  Hemostasis was obtained.  We did notice that there was a violation of the cephalic vein so this was then ligated with #1 Vicryl.  The subscapularis was then reapproximated to the humeral metaphysis using the eyelets on the collar of our implant.  Upon completion the arm easily achieved 45 degrees of external rotation.  The deltopectoral interval was then reapproximated with a series of figure-of-eight #1 Vicryl sutures.  2-0 Vicryl used for the subcu layer and intracuticular 3 Monocryl for the skin followed by Dermabond and an Aquacel dressing.  The right arm was then placed into a sling and the patient was awakened, extubated, and taken to the recovery room in stable condition.  Ralene Bathe, PA-C was used as an Geophysicist/field seismologist throughout this case essential for help with positioning the patient, positioning extremity, tissue manipulation, implantation of the prosthesis, wound closure, and intraoperative decision-making.  Vania Rea Bryley Chrisman MD   Contact # [phone]

## 2019-09-16 NOTE — Anesthesia Postprocedure Evaluation (Signed)
Anesthesia Post Note  Patient: Todd Barrett.  Procedure(s) Performed: Right Reverse shoulder arthroplasty (Right Shoulder)     Patient location during evaluation: PACU Anesthesia Type: General Level of consciousness: awake Pain management: pain level controlled Vital Signs Assessment: post-procedure vital signs reviewed and stable Respiratory status: spontaneous breathing Cardiovascular status: stable Postop Assessment: no apparent nausea or vomiting Anesthetic complications: no    Last Vitals:  Vitals:   09/16/19 1000 09/16/19 1015  BP: 130/75 125/70  Pulse: (!) 57 (!) 59  Resp: 14 15  Temp:    SpO2: 95% 97%    Last Pain:  Vitals:   09/16/19 1000  TempSrc:   PainSc: 0-No pain   Pain Goal:                   Huston Foley

## 2019-09-16 NOTE — H&P (Signed)
Todd Barrett.    Chief Complaint: right shoulder osteoarthritis HPI: The patient is a 68 y.o. male with chronic and progressively increasing right shoulder pain related to end stage rotator cuff tear arthropathy  Past Medical History:  Diagnosis Date  . ALLERGIC RHINITIS 01/08/2010  . Allergy    dust mites, pet dander  . ANEMIA-NOS 01/08/2010  . Atrial fibrillation with RVR (HCC) 01/19/2018  . CERVICAL RADICULOPATHY, RIGHT 01/08/2010  . CHEST PAIN 01/08/2010  . DIVERTICULOSIS, COLON 01/08/2010  . Dysrhythmia    post op   during PT a fib RVR  . GERD (gastroesophageal reflux disease)    Zantac OTC  prn  . GOUT 01/08/2010   in past/ when has surgery  . HYPERLIPIDEMIA 01/08/2010  . Lumbar disc disease 08/05/2011  . Osteoarthritis of right knee    end stage  . OSTEOARTHRITIS, KNEES, 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    Past Surgical History:  Procedure Laterality Date  . ANTERIOR CERVICAL DECOMP/DISCECTOMY FUSION  07/23/2012   Procedure: ANTERIOR CERVICAL DECOMPRESSION/DISCECTOMY FUSION 2 LEVELS;  Surgeon: Clydene Fake, MD;  Location: [facility];  Service: Neurosurgery;  Laterality: N/A;  Cervical three-four, four-five, redo Cervical six-seven anterior cervical decompression, discectomy fusion with allograft and plating  . BACK SURGERY  03/2004, 02/2005  . CERVICAL SPINE SURGERY  05/2010  . clavicle facture  nov. 2009   100% displacement  . COLONOSCOPY    . HERNIA REPAIR     inguinal hernia/right side/ 68 years old  . JOINT REPLACEMENT     Left knee 2006  . KNEE ARTHROSCOPY     total of 5  . left knee arthroscopy  september 2005  . lumbar disc surgury  02/2002  . lumbar disc surgury  june 2004  . NASAL RECONSTRUCTION     x 3  . REVERSE SHOULDER ARTHROPLASTY Left 07/30/2018   Procedure: LEFT REVERSE SHOULDER ARTHROPLASTY;  Surgeon: Francena Hanly, MD;  Location: [facility] OR;  Service: Orthopedics;  Laterality: Left;   . s/p ENT surgury     x 3  . s/p left shoulder surgury  feb. 2009   . s/p left TKR  april 2006  . TOTAL KNEE ARTHROPLASTY Right 01/16/2018   Procedure: RIGHT TOTAL KNEE ARTHROPLASTY;  Surgeon: Beverely Low, MD;  Location: [facility] OR;  Service: Orthopedics;  Laterality: Right;    Family History  Problem Relation Age of Onset  . Bradycardia Father 60       Pacemaker  . Lymphoma Mother   . Alzheimer's disease Mother   . Pancreatic cancer Brother   . Colon cancer Neg Hx     Social History:  reports that he has never smoked. He has never used smokeless tobacco. He reports current alcohol use of about 12.0 standard drinks of alcohol per week. He reports that he does not use drugs.   Medications Prior to Admission  Medication Sig Dispense Refill  . atorvastatin (LIPITOR) 10 MG tablet TAKE 1 TABLET BY MOUTH EVERY DAY (Patient taking differently: Take 10 mg by mouth daily at 6 PM. ) 90 tablet 3  . fexofenadine (ALLEGRA) 180 MG tablet Take 180 mg by mouth daily.     . fluticasone (FLONASE) 50 MCG/ACT nasal spray Place 2 sprays into the nose daily as needed for allergies. For allergies.     . naproxen sodium (ALEVE) 220 MG tablet Take 600-800 mg by mouth See admin instructions. Take 800 mg and 600 mg in the evening    . Omega-3 Fatty Acids (  FISH OIL) 1200 MG CPDR Take 3,600 mg by mouth daily.    . methocarbamol (ROBAXIN) 500 MG tablet Take 1 tablet (500 mg total) by mouth every 8 (eight) hours as needed for muscle spasms. (Patient not taking: Reported on 09/03/2019) 30 tablet 1     Physical Exam: Right shoulder demonstrates painful and guarded motion with global weakness as noted at his recent office visits.  Preoperative MRI scan confirms chronic and retracted tear of the rotator cuff.  Plain films confirm severe arthrosis with complete loss of joint space.  Vitals  Temp:  [97.9 F (36.6 C)] 97.9 F (36.6 C) (10/22 0609) Pulse Rate:  [57] 57 (10/22 0609) Resp:  [14] 14 (10/22 0609) BP: (128)/(81) 128/81 (10/22 0609) SpO2:  [98 %] 98 % (10/22  0609)  Assessment/Plan  Impression: right shoulder osteoarthritis  Plan of Action: Procedure(s): Right total shoulder arthroplasty vs Reverse shoulder arthroplasty  Dortha Neighbors M Genevieve Arbaugh 09/16/2019, 6:48 AM Contact # [phone]

## 2019-09-17 ENCOUNTER — Encounter (HOSPITAL_COMMUNITY): Payer: Self-pay | Admitting: Orthopedic Surgery

## 2019-09-17 ENCOUNTER — Telehealth: Payer: Self-pay

## 2019-09-17 MED ORDER — OXYCODONE-ACETAMINOPHEN 5-325 MG PO TABS: 1.0000 | ORAL_TABLET | ORAL | 0 refills | Status: DC | PRN

## 2019-09-17 MED ORDER — NAPROXEN 500 MG PO TABS: 500.0000 mg | ORAL_TABLET | Freq: Two times a day (BID) | ORAL | 1 refills | Status: DC

## 2019-09-17 MED ORDER — METHOCARBAMOL 500 MG PO TABS: 500.0000 mg | ORAL_TABLET | Freq: Three times a day (TID) | ORAL | 1 refills | Status: DC | PRN

## 2019-09-17 MED ORDER — ONDANSETRON HCL 4 MG PO TABS: 4.0000 mg | ORAL_TABLET | Freq: Three times a day (TID) | ORAL | 0 refills | Status: DC | PRN

## 2019-09-17 NOTE — Progress Notes (Signed)
RN reviewed discharge instructions with patient and family. All questions answered.   Paperwork and prescriptions given.    RN rolled patient down with all belongings to family car.

## 2019-09-17 NOTE — Telephone Encounter (Signed)
Pt is on TCM list. Admitted for reverse total shoulder arthroplasty, right.  Pt to follow up with ortho.

## 2019-09-17 NOTE — Evaluation (Signed)
Occupational Therapy Evaluation Patient Details Name: Todd Barrett. MRN: [id number] DOB: 08-Mar-1951 Today's Date: 09/17/2019    History of Present Illness 68 y o man s/p R RTSA, h/o L RTSA   Clinical Impression   All education was completed this session. Pt has h/o same sx last year but reported that use of RUE during ADL was new. He will follow up with Dr Onnie Graham for further rehab    Follow Up Recommendations  Follow surgeon's recommendation for DC plan and follow-up therapies    Equipment Recommendations  None recommended by OT    Recommendations for Other Services       Precautions / Restrictions Precautions Precautions: Shoulder Type of Shoulder Precautions: may come out of sling in controlled environment, may use arm during adls within the following parameters:  P/AA/AROM 20 ER, 45 ABD and 60 FF.  May perform lap slides, pendulums and elbow to finger ROM Restrictions Weight Bearing Restrictions: Yes RUE Weight Bearing: Non weight bearing      Mobility Bed Mobility               General bed mobility comments: supervision  Transfers                 General transfer comment: supervision    Balance                                           ADL either performed or assessed with clinical judgement   ADL Overall ADL's : Needs assistance/impaired Eating/Feeding: Independent   Grooming: Set up   Upper Body Bathing: Minimal assistance   Lower Body Bathing: Minimal assistance   Upper Body Dressing : Minimal assistance   Lower Body Dressing: Moderate assistance   Toilet Transfer: Supervision/safety             General ADL Comments: ambulated in hall.  Pt steady. Reviewed protocol, performed adl and elbow to finger ROM. Pt  verbalizes understanding of pendlums and lap slides     Vision         Perception     Praxis      Pertinent Vitals/Pain Pain Assessment: Faces Faces Pain Scale: Hurts little more Pain  Location: RUE Pain Descriptors / Indicators: Sore Pain Intervention(s): Limited activity within patient's tolerance;Monitored during session;Premedicated before session;Repositioned     Hand Dominance     Extremity/Trunk Assessment Upper Extremity Assessment Upper Extremity Assessment: RUE deficits/detail(able to use during adls within parameters)           Communication Communication Communication: No difficulties   Cognition Arousal/Alertness: Awake/alert Behavior During Therapy: WFL for tasks assessed/performed Overall Cognitive Status: Within Functional Limits for tasks assessed                                     General Comments       Exercises     Shoulder Instructions      Home Living Family/patient expects to be discharged to:: Private residence Living Arrangements: Spouse/significant other                                      Prior Functioning/Environment Level of Independence: Independent  OT Problem List:        OT Treatment/Interventions:      OT Goals(Current goals can be found in the care plan section) Acute Rehab OT Goals Patient Stated Goal: good recovery OT Goal Formulation: All assessment and education complete, DC therapy  OT Frequency:     Barriers to D/C:            Co-evaluation              AM-PAC OT "6 Clicks" Daily Activity     Outcome Measure Help from another person eating meals?: None Help from another person taking care of personal grooming?: A Little Help from another person toileting, which includes using toliet, bedpan, or urinal?: A Little Help from another person bathing (including washing, rinsing, drying)?: A Little Help from another person to put on and taking off regular upper body clothing?: A Little Help from another person to put on and taking off regular lower body clothing?: A Lot 6 Click Score: 18   End of Session    Activity Tolerance: Patient  tolerated treatment well Patient left: in chair;with call bell/phone within reach  OT Visit Diagnosis: Muscle weakness (generalized) (M62.81);Pain Pain - Right/Left: Right Pain - part of body: Shoulder                Time: 2947-6546 OT Time Calculation (min): 24 min Charges:  OT General Charges $OT Visit: 1 Visit OT Evaluation $OT Eval Low Complexity: 1 Low OT Treatments $Self Care/Home Management : 8-22 mins  Marica Otter, OTR/L Acute Rehabilitation Services [phone] [facility] pager [phone] office 09/17/2019  Sharhonda Atwood 09/17/2019, 10:08 AM

## 2019-09-17 NOTE — Discharge Summary (Signed)
PATIENT ID:      Todd Barrett.  MRN:     [id number] DOB/AGE:    [birth year] / 68 y.o.     DISCHARGE SUMMARY  ADMISSION DATE:    09/16/2019 DISCHARGE DATE:    ADMISSION DIAGNOSIS: right shoulder osteoarthritis Past Medical History:  Diagnosis Date  . ALLERGIC RHINITIS 01/08/2010  . Allergy    dust mites, pet dander  . ANEMIA-NOS 01/08/2010  . Atrial fibrillation with RVR (HCC) 01/19/2018  . CERVICAL RADICULOPATHY, RIGHT 01/08/2010  . CHEST PAIN 01/08/2010  . DIVERTICULOSIS, COLON 01/08/2010  . Dysrhythmia    post op   during PT a fib RVR  . GERD (gastroesophageal reflux disease)    Zantac OTC  prn  . GOUT 01/08/2010   in past/ when has surgery  . HYPERLIPIDEMIA 01/08/2010  . Lumbar disc disease 08/05/2011  . Osteoarthritis of right knee    end stage  . OSTEOARTHRITIS, KNEES, 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    DISCHARGE DIAGNOSIS:   Active Problems:   S/P reverse total shoulder arthroplasty, right   PROCEDURE: Procedure(s): Right Reverse shoulder arthroplasty on 09/16/2019  CONSULTS:    HISTORY:  See H&P in chart.  HOSPITAL COURSE:  Brendt Dible. is a 68 y.o. admitted on 09/16/2019 with a diagnosis of right shoulder osteoarthritis.  They were brought to the operating room on 09/16/2019 and underwent Procedure(s): Right Reverse shoulder arthroplasty.    They were given perioperative antibiotics:  Anti-infectives (From admission, onward)   Start     Dose/Rate Route Frequency Ordered Stop   09/16/19 0600  ceFAZolin (ANCEF) IVPB 2g/100 mL premix     2 g 200 mL/hr over 30 Minutes Intravenous On call to O.R. 09/16/19 0543 09/16/19 0750    .  Patient underwent the above named procedure and tolerated it well. The following day they were hemodynamically stable and pain was controlled on oral analgesics. They were neurovascularly intact to the operative extremity. OT was ordered and worked with patient per protocol. They were medically and orthopaedically stable for discharge  on .    DIAGNOSTIC STUDIES:  RECENT RADIOGRAPHIC STUDIES :  No results found.  RECENT VITAL SIGNS:   Patient Vitals for the past 24 hrs:  BP Temp Temp src Pulse Resp SpO2 Height Weight  09/17/19 0433 127/78 97.7 F (36.5 C) Oral 61 18 100 % - -  09/17/19 0120 132/86 97.7 F (36.5 C) Oral 64 16 100 % - -  09/16/19 2029 119/81 97.7 F (36.5 C) Oral 78 18 98 % - -  09/16/19 1754 136/85 98.2 F (36.8 C) Oral 71 16 99 % - -  09/16/19 1505 135/84 97.6 F (36.4 C) Oral 80 - 100 % - -  09/16/19 1331 125/80 97.9 F (36.6 C) Oral 63 16 100 % - -  09/16/19 1130 - - - - - - 5\' 10"  (1.778 m) 101.7 kg  09/16/19 1044 125/71 (!) 97.4 F (36.3 C) - (!) 52 13 - - -  09/16/19 1030 125/71 (!) 97.5 F (36.4 C) - (!) 54 - 98 % - -  09/16/19 1015 125/70 - - (!) 59 15 97 % - -  09/16/19 1000 130/75 - - (!) 57 14 95 % - -  09/16/19 0945 131/82 - - 64 16 99 % - -  09/16/19 0932 133/84 (!) 97.5 F (36.4 C) - 65 17 98 % - -  .  RECENT EKG RESULTS:    Orders placed or performed during the hospital encounter  of 09/08/19  . EKG 12 lead  . EKG 12 lead  . EKG 12-Lead  . EKG 12-Lead  . EKG 12-Lead  . EKG 12-Lead    DISCHARGE INSTRUCTIONS:    DISCHARGE MEDICATIONS:   Allergies as of 09/17/2019      Reactions   Dust Mite Extract Other (See Comments)   Nasal Congestion   Other Other (See Comments)   Pet Dander- Runny nose      Medication List    STOP taking these medications   naproxen sodium 220 MG tablet Commonly known as: ALEVE     TAKE these medications   atorvastatin 10 MG tablet Commonly known as: LIPITOR TAKE 1 TABLET BY MOUTH EVERY DAY What changed: when to take this   fexofenadine 180 MG tablet Commonly known as: ALLEGRA Take 180 mg by mouth daily.   Fish Oil 1200 MG Cpdr Take 3,600 mg by mouth daily.   fluticasone 50 MCG/ACT nasal spray Commonly known as: FLONASE Place 2 sprays into the nose daily as needed for allergies. For allergies.   methocarbamol 500 MG  tablet Commonly known as: Robaxin Take 1 tablet (500 mg total) by mouth every 8 (eight) hours as needed for muscle spasms.   naproxen 500 MG tablet Commonly known as: Naprosyn Take 1 tablet (500 mg total) by mouth 2 (two) times daily with a meal.   ondansetron 4 MG tablet Commonly known as: Zofran Take 1 tablet (4 mg total) by mouth every 8 (eight) hours as needed for nausea or vomiting.   oxyCODONE-acetaminophen 5-325 MG tablet Commonly known as: Percocet Take 1 tablet by mouth every 4 (four) hours as needed (max 6 q).       FOLLOW UP VISIT:   Follow-up Information    Justice Britain, MD.   Specialty: Orthopedic Surgery Why: call to be seen in 10-14 days Contact information: [address]  [place] [address] [phone]           DISCHARGE TO: Home   DISCHARGE CONDITION:  Thereasa Parkin Kioni Stahl for Dr. Justice Britain 09/17/2019, 8:36 AM

## 2019-09-29 DIAGNOSIS — Z471 Aftercare following joint replacement surgery: Secondary | ICD-10-CM | POA: Diagnosis not present

## 2019-09-29 DIAGNOSIS — Z96611 Presence of right artificial shoulder joint: Secondary | ICD-10-CM | POA: Diagnosis not present

## 2019-10-11 DIAGNOSIS — M25511 Pain in right shoulder: Secondary | ICD-10-CM | POA: Diagnosis not present

## 2019-10-19 DIAGNOSIS — M25511 Pain in right shoulder: Secondary | ICD-10-CM | POA: Diagnosis not present

## 2019-10-26 DIAGNOSIS — M25511 Pain in right shoulder: Secondary | ICD-10-CM | POA: Diagnosis not present

## 2019-10-29 DIAGNOSIS — M25511 Pain in right shoulder: Secondary | ICD-10-CM | POA: Diagnosis not present

## 2019-11-02 DIAGNOSIS — M25511 Pain in right shoulder: Secondary | ICD-10-CM | POA: Diagnosis not present

## 2019-11-05 DIAGNOSIS — M25511 Pain in right shoulder: Secondary | ICD-10-CM | POA: Diagnosis not present

## 2019-12-02 DIAGNOSIS — M25511 Pain in right shoulder: Secondary | ICD-10-CM | POA: Diagnosis not present

## 2019-12-07 DIAGNOSIS — M25511 Pain in right shoulder: Secondary | ICD-10-CM | POA: Diagnosis not present

## 2019-12-21 ENCOUNTER — Other Ambulatory Visit: Payer: Self-pay | Admitting: Internal Medicine

## 2019-12-21 ENCOUNTER — Encounter: Payer: Self-pay | Admitting: Internal Medicine

## 2019-12-21 ENCOUNTER — Other Ambulatory Visit: Payer: Self-pay

## 2019-12-21 ENCOUNTER — Ambulatory Visit (INDEPENDENT_AMBULATORY_CARE_PROVIDER_SITE_OTHER): Payer: Medicare Other | Admitting: Internal Medicine

## 2019-12-21 VITALS — BP 140/89 | HR 68 | Temp 98.2°F | Ht 70.0 in | Wt 229.1 lb

## 2019-12-21 DIAGNOSIS — M1A9XX0 Chronic gout, unspecified, without tophus (tophi): Secondary | ICD-10-CM

## 2019-12-21 DIAGNOSIS — R739 Hyperglycemia, unspecified: Secondary | ICD-10-CM

## 2019-12-21 DIAGNOSIS — Z23 Encounter for immunization: Secondary | ICD-10-CM | POA: Diagnosis not present

## 2019-12-21 DIAGNOSIS — E538 Deficiency of other specified B group vitamins: Secondary | ICD-10-CM

## 2019-12-21 DIAGNOSIS — D649 Anemia, unspecified: Secondary | ICD-10-CM | POA: Diagnosis not present

## 2019-12-21 DIAGNOSIS — E559 Vitamin D deficiency, unspecified: Secondary | ICD-10-CM

## 2019-12-21 DIAGNOSIS — N320 Bladder-neck obstruction: Secondary | ICD-10-CM | POA: Diagnosis not present

## 2019-12-21 DIAGNOSIS — E785 Hyperlipidemia, unspecified: Secondary | ICD-10-CM

## 2019-12-21 DIAGNOSIS — E611 Iron deficiency: Secondary | ICD-10-CM | POA: Diagnosis not present

## 2019-12-21 LAB — URINALYSIS, ROUTINE W REFLEX MICROSCOPIC
Bilirubin Urine: NEGATIVE
Ketones, ur: NEGATIVE
Leukocytes,Ua: NEGATIVE
Nitrite: NEGATIVE
RBC / HPF: NONE SEEN (ref 0–?)
Specific Gravity, Urine: >=1.03 — AB (ref 1.000–1.030)
Total Protein, Urine: NEGATIVE
Urine Glucose: NEGATIVE
Urobilinogen, UA: 0.2 (ref 0.0–1.0)
WBC, UA: NONE SEEN (ref 0–?)
pH: 5.5 (ref 5.0–8.0)

## 2019-12-21 LAB — CBC WITH DIFFERENTIAL/PLATELET
Basophils Absolute: 0 10*3/uL (ref 0.0–0.1)
Basophils Relative: 0.6 % (ref 0.0–3.0)
Eosinophils Absolute: 0.1 10*3/uL (ref 0.0–0.7)
Eosinophils Relative: 2.8 % (ref 0.0–5.0)
HCT: 44.7 % (ref 39.0–52.0)
Hemoglobin: 15 g/dL (ref 13.0–17.0)
Lymphocytes Relative: 26.8 % (ref 12.0–46.0)
Lymphs Abs: 1.4 10*3/uL (ref 0.7–4.0)
MCHC: 33.6 g/dL (ref 30.0–36.0)
MCV: 89.6 fl (ref 78.0–100.0)
Monocytes Absolute: 0.5 10*3/uL (ref 0.1–1.0)
Monocytes Relative: 10.6 % (ref 3.0–12.0)
Neutro Abs: 3 10*3/uL (ref 1.4–7.7)
Neutrophils Relative %: 59.2 % (ref 43.0–77.0)
Platelets: 166 10*3/uL (ref 150.0–400.0)
RBC: 4.98 Mil/uL (ref 4.22–5.81)
RDW: 13.7 % (ref 11.5–15.5)
WBC: 5.1 10*3/uL (ref 4.0–10.5)

## 2019-12-21 LAB — URIC ACID: Uric Acid, Serum: 6.9 mg/dL (ref 4.0–7.8)

## 2019-12-21 LAB — BASIC METABOLIC PANEL
BUN: 19 mg/dL (ref 6–23)
CO2: 27 mEq/L (ref 19–32)
Calcium: 9.5 mg/dL (ref 8.4–10.5)
Chloride: 107 mEq/L (ref 96–112)
Creatinine, Ser: 0.91 mg/dL (ref 0.40–1.50)
GFR: 82.58 mL/min (ref >60.00)
Glucose, Bld: 96 mg/dL (ref 70–99)
Potassium: 4.8 mEq/L (ref 3.5–5.1)
Sodium: 141 mEq/L (ref 135–145)

## 2019-12-21 LAB — TSH: TSH: 2.44 u[IU]/mL (ref 0.35–4.50)

## 2019-12-21 LAB — VITAMIN B12: Vitamin B-12: 360 pg/mL (ref 211–911)

## 2019-12-21 LAB — IBC PANEL
Iron: 105 ug/dL (ref 42–165)
Saturation Ratios: 30.1 % (ref 20.0–50.0)
Transferrin: 249 mg/dL (ref 212.0–360.0)

## 2019-12-21 LAB — HEPATIC FUNCTION PANEL
ALT: 40 U/L (ref 0–53)
AST: 32 U/L (ref 0–37)
Albumin: 4.3 g/dL (ref 3.5–5.2)
Alkaline Phosphatase: 63 U/L (ref 39–117)
Bilirubin, Direct: 0.1 mg/dL (ref 0.0–0.3)
Total Bilirubin: 0.6 mg/dL (ref 0.2–1.2)
Total Protein: 6.4 g/dL (ref 6.0–8.3)

## 2019-12-21 LAB — HEMOGLOBIN A1C: Hgb A1c MFr Bld: 5.6 % (ref 4.6–6.5)

## 2019-12-21 LAB — LIPID PANEL
Cholesterol: 142 mg/dL (ref 0–200)
HDL: 65.9 mg/dL (ref >39.00)
LDL Cholesterol: 62 mg/dL (ref 0–99)
NonHDL: 75.87
Total CHOL/HDL Ratio: 2
Triglycerides: 70 mg/dL (ref 0.0–149.0)
VLDL: 14 mg/dL (ref 0.0–40.0)

## 2019-12-21 LAB — VITAMIN D 25 HYDROXY (VIT D DEFICIENCY, FRACTURES): VITD: 17.62 ng/mL — ABNORMAL LOW (ref 30.00–100.00)

## 2019-12-21 LAB — PSA: PSA: 0.56 ng/mL (ref 0.10–4.00)

## 2019-12-21 MED ORDER — VITAMIN D (ERGOCALCIFEROL) 1.25 MG (50000 UNIT) PO CAPS: 50000.0000 [IU] | ORAL_CAPSULE | ORAL | 0 refills | Status: DC

## 2019-12-21 NOTE — Progress Notes (Signed)
Subjective:    Patient ID: Todd Barrett., male    DOB: 07/03/1951, 69 y.o.   MRN: [id number]  HPI Here for yearly f/u;  Overall doing ok;  Pt denies Chest pain, worsening SOB, DOE, wheezing, orthopnea, PND, worsening LE edema, palpitations, dizziness or syncope.  Pt denies neurological change such as new headache, facial or extremity weakness.  Pt denies polydipsia, polyuria, or low sugar symptoms. Pt states overall good compliance with treatment and medications, good tolerability, and has been trying to follow appropriate diet.  Pt denies worsening depressive symptoms, suicidal ideation or panic. No fever, night sweats, wt loss, loss of appetite, or other constitutional symptoms.  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  Has ongoing knee, neck and lower back pain no real chane BP Readings from Last 3 Encounters:  12/21/19 140/89  09/17/19 127/78  09/08/19 (!) 148/78   Wt Readings from Last 3 Encounters:  12/21/19 229 lb 2 oz (103.9 kg)  09/16/19 224 lb 4 oz (101.7 kg)  09/08/19 224 lb 4 oz (101.7 kg)   Past Medical History:  Diagnosis Date  . ALLERGIC RHINITIS 01/08/2010  . Allergy    dust mites, pet dander  . ANEMIA-NOS 01/08/2010  . Atrial fibrillation with RVR ([place] [facility]) 01/19/2018  . CERVICAL RADICULOPATHY, RIGHT 01/08/2010  . CHEST PAIN 01/08/2010  . DIVERTICULOSIS, COLON 01/08/2010  . Dysrhythmia    post op   during PT a fib RVR  . GERD (gastroesophageal reflux disease)    Zantac OTC  prn  . GOUT 01/08/2010   in past/ when has surgery  . HYPERLIPIDEMIA 01/08/2010  . Lumbar disc disease 08/05/2011  . Osteoarthritis of right knee    end stage  . OSTEOARTHRITIS, KNEES, 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   Past Surgical History:  Procedure Laterality Date  . ANTERIOR CERVICAL DECOMP/DISCECTOMY FUSION   07/23/2012   Procedure: ANTERIOR CERVICAL DECOMPRESSION/DISCECTOMY FUSION 2 LEVELS;  Surgeon: Otilio Connors, MD;  Location: [facility];  Service: Neurosurgery;  Laterality: N/A;  Cervical three-four, four-five, redo Cervical six-seven anterior cervical decompression, discectomy fusion with allograft and plating  . BACK SURGERY  03/2004, 02/2005  . CERVICAL SPINE SURGERY  05/2010  . clavicle facture  nov. 2009   100% displacement  . COLONOSCOPY    . HERNIA REPAIR     inguinal hernia/right side/ 69 years old  . JOINT REPLACEMENT     Left knee 2006  . KNEE ARTHROSCOPY     total of 5  . left knee arthroscopy  september 2005  . lumbar disc surgury  02/2002  . lumbar disc surgury  june 2004  . NASAL RECONSTRUCTION     x 3  . REVERSE SHOULDER ARTHROPLASTY Left 07/30/2018   Procedure: LEFT REVERSE SHOULDER ARTHROPLASTY;  Surgeon: Justice Britain, MD;  Location: [facility];  Service: Orthopedics;  Laterality: Left;  156min  . s/p ENT surgury     x 3  . s/p left shoulder surgury  feb. 2009  . s/p left TKR  april 2006  . TOTAL KNEE ARTHROPLASTY Right 01/16/2018   Procedure: RIGHT TOTAL KNEE ARTHROPLASTY;  Surgeon: Netta Cedars, MD;  Location: [facility];  Service: Orthopedics;  Laterality: Right;  . TOTAL SHOULDER ARTHROPLASTY Right 09/16/2019   Procedure: Right Reverse shoulder arthroplasty;  Surgeon: Justice Britain, MD;  Location: [facility];  Service: Orthopedics;  Laterality: Right;    reports that he has never smoked. He has never used smokeless tobacco. He reports current alcohol use of about 12.0 standard drinks of alcohol per week. He reports that he does not use drugs. family history includes Alzheimer's disease in his mother; Bradycardia (age of onset: 76) in his father; Lymphoma in his mother; Pancreatic cancer in his brother. Allergies  Allergen Reactions  . Dust Mite Extract Other (See Comments)    Nasal Congestion  . Other Other (See Comments)    Pet Dander- Runny nose   Current Outpatient  Medications on File Prior to Visit  Medication Sig Dispense Refill  . atorvastatin (LIPITOR) 10 MG tablet TAKE 1 TABLET BY MOUTH EVERY DAY (Patient taking differently: Take 10 mg by mouth daily at 6 PM. ) 90 tablet 3  . fexofenadine (ALLEGRA) 180 MG tablet Take 180 mg by mouth daily.     . fluticasone (FLONASE) 50 MCG/ACT nasal spray Place 2 sprays into the nose daily as needed for allergies. For allergies.     . naproxen (NAPROSYN) 500 MG tablet Take 1 tablet (500 mg total) by mouth 2 (two) times daily with a meal. 60 tablet 1  . Omega-3 Fatty Acids (FISH OIL) 1200 MG CPDR Take 3,600 mg by mouth daily.    . methocarbamol (ROBAXIN) 500 MG tablet Take 1 tablet (500 mg total) by mouth every 8 (eight) hours as needed for muscle spasms. (Patient not taking: Reported on 12/21/2019) 30 tablet 1  . ondansetron (ZOFRAN) 4 MG tablet Take 1 tablet (4 mg total) by mouth every 8 (eight) hours as needed for nausea or vomiting. (Patient not taking: Reported on 12/21/2019) 10 tablet 0  . oxyCODONE-acetaminophen (PERCOCET) 5-325 MG tablet Take 1 tablet by mouth every 4 (four) hours as needed (max 6 q). (Patient not taking: Reported on 12/21/2019) 30 tablet 0   No current facility-administered medications on file prior to visit.   Review of Systems All otherwise neg per pt     Objective:   Physical Exam BP 140/89 (BP Location: Left Arm, Patient Position: Sitting, Cuff Size: Normal)   Pulse 68   Temp 98.2 F (36.8 C) (Oral)   Ht 5\' 10"  (1.778 m)   Wt 229 lb 2 oz (103.9 kg)   SpO2 98%   BMI 32.88 kg/m  VS noted,  Constitutional: Pt appears in NAD HENT: Head: NCAT.  Right Ear: External ear normal.  Left Ear: External ear normal.  Eyes: . Pupils are equal, round, and reactive to light. Conjunctivae and EOM are normal Nose: without d/c or deformity Neck: Neck supple. Gross normal ROM Cardiovascular: Normal rate and regular rhythm.   Pulmonary/Chest: Effort normal and breath sounds without rales or  wheezing.  Abd:  Soft, NT, ND, + BS, no organomegaly Neurological: Pt is alert. At baseline orientation, motor grossly intact Skin: Skin is warm. No rashes, other new lesions, no LE edema Psychiatric: Pt behavior is normal without agitation  All otherwise neg per pt  Lab Results  Component Value Date   WBC 4.8 09/08/2019   HGB 14.4 09/08/2019   HCT 43.1 09/08/2019   PLT 184 09/08/2019   GLUCOSE 93 09/08/2019   CHOL 142 12/17/2018   TRIG 51.0 12/17/2018   HDL 59.60 12/17/2018   LDLDIRECT 142.0 09/29/2012   LDLCALC 72 12/17/2018   ALT 37 12/17/2018   AST 34 12/17/2018   NA 139 09/08/2019   K 4.3 09/08/2019   CL 106 09/08/2019  CREATININE 0.93 09/08/2019   BUN 23 09/08/2019   CO2 23 09/08/2019   TSH 2.02 12/17/2018   PSA 0.63 12/17/2018   INR 1.03 07/23/2012   HGBA1C 5.6 12/17/2018   MICROALBUR 1.0 11/27/2016       Assessment & Plan:

## 2019-12-21 NOTE — Assessment & Plan Note (Signed)
stable overall by history and exam, recent data reviewed with pt, and pt to continue medical treatment as before,  to f/u any worsening symptoms or concerns, for f/u lab 

## 2019-12-21 NOTE — Assessment & Plan Note (Addendum)
stable overall by history and exam, recent data reviewed with pt, and pt to continue medical treatment as before,  to f/u any worsening symptoms or concerns, for f/u lab  I spent 32 minutes preparing to see the patient by review of recent labs, imaging and procedures, obtaining and reviewing separately obtained history, communicating with the patient and family or caregiver, ordering medications, tests or procedures, and documenting clinical information in the EHR including the differential Dx, treatment, and any further evaluation and other management of HLD, hyperglycemia, gout, anemia

## 2019-12-21 NOTE — Patient Instructions (Addendum)
You had the flu shot today  Please continue all other medications as before, and refills have been done if requested.  Please have the pharmacy call with any other refills you may need.  Please continue your efforts at being more active, low cholesterol diet, and weight control.  You are otherwise up to date with prevention measures today.  Please keep your appointments with your specialists as you may have planned  Please go to the LAB at the blood drawing area for the tests to be done  You will be contacted by phone if any changes need to be made immediately.  Otherwise, you will receive a letter about your results with an explanation, but please check with MyChart first.  Please remember to sign up for MyChart if you have not done so, as this will be important to you in the future with finding out test results, communicating by private email, and scheduling acute appointments online when needed.  Please make an Appointment to return for your 1 year visit, or sooner if needed 

## 2020-03-05 NOTE — Progress Notes (Signed)
Cardiology Office Note   Date:  03/06/2020   ID:  Earney Mallet., DOB 03/04/1951, MRN [id number]  PCP:  Biagio Borg, MD    No chief complaint on file.  AFib  Wt Readings from Last 3 Encounters:  03/06/20 231 lb (104.8 kg)  12/21/19 229 lb 2 oz (103.9 kg)  09/16/19 224 lb 4 oz (101.7 kg)       History of Present Illness: Todd Kosh. is a 69 y.o. male with a history of atrial fibrillation which was diagnosed in 2019, at the time of total knee replacement.  He was noted to have a heart rate in the 120s while working with physical therapy.  He was treated with Cardizem and then placed on Xarelto.  Cardioversion was planned but he went back into normal sinus rhythm prior to hospital discharge. Echo done on 01/29/18 which showed normal LVEF, 55-60%, G1DD and mild AI. The LA was moderately dilated.  In 2019, he stopped his diltiazem and Xarelto.  He was last seen in our office in March 2019 and was doing well at that time.  Since the last visit, he has had both shoulders operated upon.  Knee is doing well.  Denies : Chest pain. Dizziness. Leg edema. Nitroglycerin use. Orthopnea. Palpitations. Paroxysmal nocturnal dyspnea. Shortness of breath. Syncope.   He remains very active- doing landscaping projects; farming.   Past Medical History:  Diagnosis Date  . ALLERGIC RHINITIS 01/08/2010  . Allergy    dust mites, pet dander  . ANEMIA-NOS 01/08/2010  . Atrial fibrillation with RVR ([facility]) 01/19/2018  . CERVICAL RADICULOPATHY, RIGHT 01/08/2010  . CHEST PAIN 01/08/2010  . DIVERTICULOSIS, COLON 01/08/2010  . Dysrhythmia    post op   during PT a fib RVR  . GERD (gastroesophageal reflux disease)    Zantac OTC  prn  . GOUT 01/08/2010   in past/ when has surgery  . HYPERLIPIDEMIA 01/08/2010  . Lumbar disc disease 08/05/2011  . Osteoarthritis of right knee    end stage  . OSTEOARTHRITIS, KNEES, 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    Past Surgical History:  Procedure Laterality  Date  . ANTERIOR CERVICAL DECOMP/DISCECTOMY FUSION  07/23/2012   Procedure: ANTERIOR CERVICAL DECOMPRESSION/DISCECTOMY FUSION 2 LEVELS;  Surgeon: Otilio Connors, MD;  Location: [facility];  Service: Neurosurgery;  Laterality: N/A;  Cervical three-four, four-five, redo Cervical six-seven anterior cervical decompression, discectomy fusion with allograft and plating  . BACK SURGERY  03/2004, 02/2005  . CERVICAL SPINE SURGERY  05/2010  . clavicle facture  nov. 2009   100% displacement  . COLONOSCOPY    . HERNIA REPAIR     inguinal hernia/right side/ 69 years old  . JOINT REPLACEMENT     Left knee 2006  . KNEE ARTHROSCOPY     total of 5  . left knee arthroscopy  september 2005  . lumbar disc surgury  02/2002  . lumbar disc surgury  june 2004  . NASAL RECONSTRUCTION     x 3  . REVERSE SHOULDER ARTHROPLASTY Left 07/30/2018   Procedure: LEFT REVERSE SHOULDER ARTHROPLASTY;  Surgeon: Justice Britain, MD;  Location: [facility];  Service: Orthopedics;  Laterality: Left;  160mn  . s/p ENT surgury     x 3  . s/p left shoulder surgury  feb. 2009  . s/p left TKR  april 2006  . TOTAL KNEE ARTHROPLASTY Right 01/16/2018   Procedure: RIGHT TOTAL KNEE ARTHROPLASTY;  Surgeon: NNetta Cedars MD;  Location: [facility]  Service: Orthopedics;  Laterality: Right;  . TOTAL SHOULDER ARTHROPLASTY Right 09/16/2019   Procedure: Right Reverse shoulder arthroplasty;  Surgeon: Justice Britain, MD;  Location: [facility];  Service: Orthopedics;  Laterality: Right;     Current Outpatient Medications  Medication Sig Dispense Refill  . atorvastatin (LIPITOR) 10 MG tablet TAKE 1 TABLET BY MOUTH EVERY DAY (Patient taking differently: Take 10 mg by mouth daily at 6 PM. ) 90 tablet 3  . fexofenadine (ALLEGRA) 180 MG tablet Take 180 mg by mouth daily.     . fluticasone (FLONASE) 50 MCG/ACT nasal spray Place 2 sprays into the nose daily as needed for allergies. For allergies.     . naproxen (NAPROSYN) 500 MG tablet Take 1 tablet (500 mg total) by  mouth 2 (two) times daily with a meal. 60 tablet 1  . Omega-3 Fatty Acids (FISH OIL) 1200 MG CPDR Take 3,600 mg by mouth daily.    . Vitamin D, Ergocalciferol, (DRISDOL) 1.25 MG (50000 UNIT) CAPS capsule Take 1 capsule (50,000 Units total) by mouth every 7 (seven) days. 12 capsule 0   No current facility-administered medications for this visit.    Allergies:   Dust mite extract and Other    Social History:  The patient  reports that he has never smoked. He has never used smokeless tobacco. He reports current alcohol use of about 12.0 standard drinks of alcohol per week. He reports that he does not use drugs.   Family History:  The patient's family history includes Alzheimer's disease in his mother; Bradycardia (age of onset: 50) in his father; Lymphoma in his mother; Pancreatic cancer in his brother.    ROS:  Please see the history of present illness.   Otherwise, review of systems are positive for occasional shoulder pains.   All other systems are reviewed and negative.    PHYSICAL EXAM: VS:  BP (!) 144/86   Pulse 63   Ht '5\' 10"'  (1.778 m)   Wt 231 lb (104.8 kg)   SpO2 97%   BMI 33.15 kg/m  , BMI Body mass index is 33.15 kg/m. GEN: Well nourished, well developed, in no acute distress  HEENT: normal  Neck: no JVD, carotid bruits, or masses Cardiac: RRR; no murmurs, rubs, or gallops,no edema  Respiratory:  clear to auscultation bilaterally, normal work of breathing GI: soft, nontender, nondistended, + BS MS: no deformity or atrophy  Skin: warm and dry, no rash Neuro:  Strength and sensation are intact Psych: euthymic mood, full affect   EKG:   The ekg ordered  demonstrates NSR   Recent Labs: 12/21/2019: ALT 40; BUN 19; Creatinine, Ser 0.91; Hemoglobin 15.0; Platelets 166.0; Potassium 4.8; Sodium 141; TSH 2.44   Lipid Panel    Component Value Date/Time   CHOL 142 12/21/2019 0930   TRIG 70.0 12/21/2019 0930   TRIG 93 10/23/2006 0848   HDL 65.90 12/21/2019 0930    CHOLHDL 2 12/21/2019 0930   VLDL 14.0 12/21/2019 0930   LDLCALC 62 12/21/2019 0930   LDLDIRECT 142.0 09/29/2012 8250     Other studies Reviewed: Additional studies/ records that were reviewed today with results demonstrating: PMD labs reviewed.   ASSESSMENT AND PLAN:  1. Paroxysmal atrial fibrillation: This occurred in the setting of total knee replacement in 2019.  No sx of AFib.  Off of anticoagulation. 2. Elevated BP readings. Working on weight loss.  Readings have been more consistently in the 539J to 673 systolic range.  Year start lisinopril 10 mg daily.  He will need a be met in 1 week.  We will also have him follow-up with the Pharm.D. hypertension clinic in a few weeks.  If blood pressure is well controlled, he can follow-up with me in 1 year.  He will also need regular follow-up with his primary care doctor.  He will get a blood pressure monitor so that he can check at home as well. 3. Hyperlipidemia: LDL 62.  Continue atorvastatin.   Current medicines are reviewed at length with the patient today.  The patient concerns regarding his medicines were addressed.  The following changes have been made:  No change  Labs/ tests ordered today include:  No orders of the defined types were placed in this encounter.   Recommend 150 minutes/week of aerobic exercise Low fat, low carb, high fiber diet recommended  Disposition:   FU in HTN clinic as above   Signed, Larae Grooms, MD  03/06/2020 9:14 AM    [facility] [place], [place], [place]  [address] Phone: [phone]; Fax: [phone]

## 2020-03-06 ENCOUNTER — Encounter: Payer: Self-pay | Admitting: Interventional Cardiology

## 2020-03-06 ENCOUNTER — Ambulatory Visit (INDEPENDENT_AMBULATORY_CARE_PROVIDER_SITE_OTHER): Payer: Medicare Other | Admitting: Interventional Cardiology

## 2020-03-06 ENCOUNTER — Other Ambulatory Visit: Payer: Self-pay

## 2020-03-06 VITALS — BP 144/86 | HR 63 | Ht 70.0 in | Wt 231.0 lb

## 2020-03-06 DIAGNOSIS — I480 Paroxysmal atrial fibrillation: Secondary | ICD-10-CM | POA: Diagnosis not present

## 2020-03-06 DIAGNOSIS — I10 Essential (primary) hypertension: Secondary | ICD-10-CM | POA: Diagnosis not present

## 2020-03-06 DIAGNOSIS — E782 Mixed hyperlipidemia: Secondary | ICD-10-CM

## 2020-03-06 MED ORDER — LISINOPRIL 10 MG PO TABS: 10.0000 mg | ORAL_TABLET | Freq: Every day | ORAL | 3 refills | Status: DC

## 2020-03-06 NOTE — Patient Instructions (Signed)
Medication Instructions:  Your physician has recommended you make the following change in your medication:   START: lisinopril 10 mg once a day  *If you need a refill on your cardiac medications before your next appointment, please call your pharmacy*   Lab Work: Your physician recommends that you return for lab work (BMET) on 03/13/20  If you have labs (blood work) drawn today and your tests are completely normal, you will receive your results only by: Marland Kitchen MyChart Message (if you have MyChart) OR . A paper copy in the mail If you have any lab test that is abnormal or we need to change your treatment, we will call you to review the results.   Testing/Procedures: None ordered   Follow-Up: Follow up in the Hypertension Clinic on 03/28/20 at 9:30 AM  At [facility], you and your health needs are our priority.  As part of our continuing mission to provide you with exceptional heart care, we have created designated Provider Care Teams.  These Care Teams include your primary Cardiologist (physician) and Advanced Practice Providers (APPs -  Physician Assistants and Nurse Practitioners) who all work together to provide you with the care you need, when you need it.  We recommend signing up for the patient portal called "MyChart".  Sign up information is provided on this After Visit Summary.  MyChart is used to connect with patients for Virtual Visits (Telemedicine).  Patients are able to view lab/test results, encounter notes, upcoming appointments, etc.  Non-urgent messages can be sent to your provider as well.   To learn more about what you can do with MyChart, go to [email or web address].    Your next appointment:   12 month(s)  The format for your next appointment:   In Person  Provider:   You may see Lance Muss, MD or one of the following Advanced Practice Providers on your designated Care Team:    Ronie Spies, PA-C  Jacolyn Reedy, PA-C    Other Instructions

## 2020-03-13 ENCOUNTER — Other Ambulatory Visit: Payer: Self-pay

## 2020-03-13 ENCOUNTER — Other Ambulatory Visit: Payer: Medicare Other | Admitting: *Deleted

## 2020-03-13 DIAGNOSIS — I480 Paroxysmal atrial fibrillation: Secondary | ICD-10-CM

## 2020-03-13 DIAGNOSIS — I10 Essential (primary) hypertension: Secondary | ICD-10-CM | POA: Diagnosis not present

## 2020-03-14 LAB — BASIC METABOLIC PANEL
BUN/Creatinine Ratio: 27 — ABNORMAL HIGH (ref 10–24)
BUN: 27 mg/dL (ref 8–27)
CO2: 21 mmol/L (ref 20–29)
Calcium: 9.2 mg/dL (ref 8.6–10.2)
Chloride: 105 mmol/L (ref 96–106)
Creatinine, Ser: 1.01 mg/dL (ref 0.76–1.27)
GFR calc Af Amer: 87 mL/min/{1.73_m2} (ref >59)
GFR calc non Af Amer: 76 mL/min/{1.73_m2} (ref >59)
Glucose: 77 mg/dL (ref 65–99)
Potassium: 4.6 mmol/L (ref 3.5–5.2)
Sodium: 139 mmol/L (ref 134–144)

## 2020-03-16 ENCOUNTER — Other Ambulatory Visit: Payer: Self-pay | Admitting: Internal Medicine

## 2020-03-16 NOTE — Telephone Encounter (Signed)
Please refill as per office routine med refill policy (all routine meds refilled for 3 mo or monthly per pt preference up to one year from last visit, then month to month grace period for 3 mo, then further med refills will have to be denied)  

## 2020-03-28 ENCOUNTER — Ambulatory Visit (INDEPENDENT_AMBULATORY_CARE_PROVIDER_SITE_OTHER): Payer: Medicare Other | Admitting: Pharmacist

## 2020-03-28 ENCOUNTER — Other Ambulatory Visit: Payer: Self-pay

## 2020-03-28 DIAGNOSIS — I10 Essential (primary) hypertension: Secondary | ICD-10-CM | POA: Diagnosis not present

## 2020-03-28 NOTE — Patient Instructions (Addendum)
It was nice meeting you today.  Your blood pressure goal is <130/80.  Continue your lisinopril 10 mg once daily.  Continue limiting salt in your diet and physical exercise.    We will message Dr. Eldridge Dace regarding your heart rate.  Please call with any questions.  [facility] [address] [place], [place] [address] Phone: [phone]; Fax: [phone] 03/28/2020 9:55 AM

## 2020-03-28 NOTE — Progress Notes (Signed)
Patient ID: Todd Barrett.                 DOB: 1951/02/19                      MRN: [id number]     HPI: Todd Yett. is a 69 y.o. male referred by Dr. Irish Lack to HTN clinic. PMH is significant for A Fib, anemia, gout, and total knee replacement.  Hospitalized in 2019 and was discovered to be in A Fib.  Converted back to sinus rhythm without cardioversion and placed on diltiazem and Xarelto.  Echo on 01/29/18 showed LVEF 55-60%.  Discontinued diltiazem and Xarelto in 08/19.  Has had numerous orthopedic surgeries.  Seen by Dr. Irish Lack on 03/06/20.  BP was 144/86 and started on lisinopril 10 mg daily.  Referred to HTN clinic.  BMP performed on 03/13/20, renal function WNL.  Patient presents today for first visit to HTN clinic.  Last BP readings: 118/76, 108/66, 96/66, 120/75, 107/63, 129/79.  Home monitor picked up irregular heartbeat in late April.  Reports a lot of stress and activity at home and this usually corresponds to his heartbeat irregularities.  Uses Omron BP cuff, measures on right bicep.  Takes lisinopril around 6 AM.  Measures BP when he wakes up, at 10 AM and 2 PM. Does not add salt to food but reports he does drink sugar free gatorade if he is working in the yard.  No longer drinks caffeine or red bull. Reports his main issue is stress at work and with family life.  Notices his monitor picks up irregular heartbeats when stressed or when he is very physically active.  Takes naproxen 500 mg twice daily.  Current HTN meds:  Lisinopril 10 mg daily Previously tried: diltiazem CD 166m daily BP goal: <130/80  Family History: Father: bradycardia  Social History: Non smoker, has 12 alcoholic drinks per week  Exercise: Active doing landscaping projects, mowing lawn, and farming.  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  Home BP readings: 118/76, 108/66, 96/66, 120/75, 107/63, 129/79.     Wt Readings  from Last 3 Encounters:  03/06/20 231 lb (104.8 kg)  12/21/19 229 lb 2 oz (103.9 kg)  09/16/19 224 lb 4 oz (101.7 kg)   BP Readings from Last 3 Encounters:  03/06/20 (!) 144/86  12/21/19 140/89  09/17/19 127/78   Pulse Readings from Last 3 Encounters:  03/06/20 63  12/21/19 68  09/17/19 61    Renal function: CrCl cannot be calculated (Unknown ideal weight.).  Scr 1.01 on 03/13/20.  eGFR 76  Past Medical History:  Diagnosis Date  . ALLERGIC RHINITIS 01/08/2010  . Allergy    dust mites, pet dander  . ANEMIA-NOS 01/08/2010  . Atrial fibrillation with RVR ([facility] 01/19/2018  . CERVICAL RADICULOPATHY, RIGHT 01/08/2010  . CHEST PAIN 01/08/2010  . DIVERTICULOSIS, COLON 01/08/2010  . Dysrhythmia    post op   during PT a fib RVR  . GERD (gastroesophageal reflux disease)    Zantac OTC  prn  . GOUT 01/08/2010   in past/ when has surgery  . HYPERLIPIDEMIA 01/08/2010  . Lumbar disc disease 08/05/2011  . Osteoarthritis of right knee    end stage  . OSTEOARTHRITIS, KNEES, 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    Current Outpatient Medications on File Prior to Visit  Medication Sig Dispense Refill  . atorvastatin (LIPITOR) 10 MG tablet TAKE 1  TABLET BY MOUTH EVERY DAY 90 tablet 2  . fexofenadine (ALLEGRA) 180 MG tablet Take 180 mg by mouth daily.     . fluticasone (FLONASE) 50 MCG/ACT nasal spray Place 2 sprays into the nose daily as needed for allergies. For allergies.     Marland Kitchen lisinopril (ZESTRIL) 10 MG tablet Take 1 tablet (10 mg total) by mouth daily. 90 tablet 3  . naproxen (NAPROSYN) 500 MG tablet Take 1 tablet (500 mg total) by mouth 2 (two) times daily with a meal. 60 tablet 1  . Omega-3 Fatty Acids (FISH OIL) 1200 MG CPDR Take 3,600 mg by mouth daily.    . Vitamin D, Ergocalciferol, (DRISDOL) 1.25 MG (50000 UNIT) CAPS capsule Take 1 capsule (50,000 Units total) by mouth every 7 (seven) days. 12 capsule 0   No current facility-administered medications on file prior to visit.    Allergies  Allergen  Reactions  . Dust Mite Extract Other (See Comments)    Nasal Congestion  . Other Other (See Comments)    Pet Dander- Runny nose     Assessment/Plan:  1. Hypertension - BP today 116/68 which is at goal of < 130/80.  Recommend patient continue lisinopril 10 mg once daily as well as limiting salt intake and continuing to be physically active.  Counseled patient to be conscious of how much gatorade he drinks due to salt content.  Recommended patient speak with family members and coworkers about stress on the job as it may have negative effects on his BP.  Counseled patient on possible BP additive effect of lisinopril and naproxen.  Will contact Dr. Irish Lack regarding increased heart rate and possible arrhythmias when patient is overexerting himself and/or stressed.  Karren Cobble, PharmD, BCACP, [place] [address] [place], [place] [address] Phone: [phone]; Fax: [phone] 03/28/2020 10:14 AM

## 2020-03-31 NOTE — Progress Notes (Signed)
Todd Barrett,  WOuld you consider an ILR for this patient who had AFib at the time of surgery in 2019, but is now having irregular heart beats by his BP monitor? Last ECG in 08/2019 showed NSR.  Thanks.  JV

## 2020-04-06 ENCOUNTER — Encounter: Payer: Self-pay | Admitting: *Deleted

## 2020-04-06 ENCOUNTER — Telehealth: Payer: Self-pay

## 2020-04-06 DIAGNOSIS — I480 Paroxysmal atrial fibrillation: Secondary | ICD-10-CM

## 2020-04-06 NOTE — Telephone Encounter (Signed)
Left message for patient to call back  

## 2020-04-06 NOTE — Telephone Encounter (Signed)
-----   Message from Corky Crafts, MD sent at 04/06/2020  9:23 AM EDT ----- If he is having sx at least once a week, plan for 30 day monitor.  ----- Message ----- From: Hillis Range, MD Sent: 04/04/2020  11:14 PM EDT To: Corky Crafts, MD, Lattie Haw, RN, #  If symptoms are frequent, I think that you could start with a 30 day monitor. If infrequent, LINQ would be a good idea.   ----- Message ----- From: Corky Crafts, MD Sent: 03/31/2020   6:41 PM EDT To: Hillis Range, MD, Lattie Haw, RN, #    ----- Message ----- From: Cheree Ditto, [facility] Sent: 03/28/2020   1:06 PM EDT To: Corky Crafts, MD  Dr. Eldridge Dace, I saw patient today for HTN.  BP controlled on lisinopril 10 mg daily.  Patient with history of A Fib, not currently anticoagulated and no longer on diltiazem.  Patient's BP monitor is picking up arrhythmias, especially when he is under stress or has done physical activity.  Should he follow up with you?

## 2020-04-06 NOTE — Telephone Encounter (Signed)
Called and spoke to the patient. He states that last week it was happening 4 times a day and this week he has not had any. Patient is interested in having the LINQ placed but does not want to wait until next availability in June with Dr. Rayann Heman. Patient requesting to wear the 30 day monitor . Made patient aware that the monitor will be mailed to him. He verbalized understanding and thanked me for the call.  [employer] Cardiac Event Monitor Instructions Your physician has requested you wear your cardiac event monitor for _____ days, (1-30). [employer] may call or text to confirm a shipping address. The monitor will be sent to a land address via UPS. [employer] will not ship a monitor to a PO BOX. It typically takes 3-5 days to receive your monitor after it has been enrolled. [employer] will assist with USPS tracking if your package is delayed. The telephone number for [employer] is [phone]. Once you have received your monitor, please review the enclosed instructions. Instruction tutorials can also be viewed under help and settings on the enclosed cell phone. Your monitor has already been registered assigning a specific monitor serial # to you.  Applying the monitor Remove cell phone from case and turn it on. The cell phone works as Dealer and needs to be within [employer] of you at all times. The cell phone will need to be charged on a daily basis. We recommend you plug the cell phone into the enclosed charger at your bedside table every night.  Monitor batteries: You will receive two monitor batteries labelled #1 and #2. These are your recorders. Plug battery #2 onto the second connection on the enclosed charger. Keep one battery on the charger at all times. This will keep the monitor battery deactivated. It will also keep it fully charged for when you need to switch your monitor batteries. A small light will be blinking on the battery emblem when it is charging. The light on  the battery emblem will remain on when the battery is fully charged.  Open package of a Monitor strip. Insert battery #1 into black hood on strip and gently squeeze monitor battery onto connection as indicated in instruction booklet. Set aside while preparing skin.  Choose location for your strip, vertical or horizontal, as indicated in the instruction booklet. Shave to remove all hair from location. There cannot be any lotions, oils, powders, or colognes on skin where monitor is to be applied. Wipe skin clean with enclosed Saline wipe. Dry skin completely.  Peel paper labeled #1 off the back of the Monitor strip exposing the adhesive. Place the monitor on the chest in the vertical or horizontal position shown in the instruction booklet. One arrow on the monitor strip must be pointing upward. Carefully remove paper labeled #2, attaching remainder of strip to your skin. Try not to create any folds or wrinkles in the strip as you apply it.  Firmly press and release the circle in the center of the monitor battery. You will hear a small beep. This is turning the monitor battery on. The heart emblem on the monitor battery will light up every 5 seconds if the monitor battery in turned on and connected to the patient securely. Do not push and hold the circle down as this turns the monitor battery off. The cell phone will locate the monitor battery. A screen will appear on the cell phone checking the connection of your monitor strip. This may read poor connection initially but change  to good connection within the next minute. Once your monitor accepts the connection you will hear a series of 3 beeps followed by a climbing crescendo of beeps. A screen will appear on the cell phone showing the two monitor strip placement options. Touch the picture that demonstrates where you applied the monitor strip.  Your monitor strip and battery are waterproof. You are able to shower, bathe, or swim with the  monitor on. They just ask you do not submerge deeper than 3 feet underwater. We recommend removing the monitor if you are swimming in a lake, river, or ocean.  Your monitor battery will need to be switched to a fully charged monitor battery approximately once a week. The cell phone will alert you of an action which needs to be made.  On the cell phone, tap for details to reveal connection status, monitor battery status, and cell phone battery status. The green dots indicates your monitor is in good status. A red dot indicates there is something that needs your attention.  To record a symptom, click the circle on the monitor battery. In 30-60 seconds a list of symptoms will appear on the cell phone. Select your symptom and tap save. Your monitor will record a sustained or significant arrhythmia regardless of you clicking the button. Some patients do not feel the heart rhythm irregularities. [employer] will notify us of any serious or critical events.  Refer to instruction booklet for instructions on switching batteries, changing strips, the Do not disturb or Pause features, or any additional questions.  Call [employer] at [phone], to confirm your monitor is transmitting and record your baseline. They will answer any questions you may have regarding the monitor instructions at that time.  Returning the monitor to [employer] Place all equipment back into blue box. Peel off strip of paper to expose adhesive and close box securely. There is a prepaid UPS shipping label on this box. Drop in a UPS drop box, or at a UPS facility like [employer]. You may also contact [employer] to arrange UPS to pick up monitor package at your home.

## 2020-04-06 NOTE — Telephone Encounter (Signed)
Patient returning Brittany's call.  

## 2020-04-06 NOTE — Progress Notes (Signed)
Patient ID: Todd Barrett., male   DOB: 03-09-1951, 69 y.o.   MRN: [id number] Patient enrolled for [employer] to ship a 30 day cardiac event monitor to his home.

## 2020-04-12 ENCOUNTER — Encounter (INDEPENDENT_AMBULATORY_CARE_PROVIDER_SITE_OTHER): Payer: Medicare Other

## 2020-04-12 DIAGNOSIS — I480 Paroxysmal atrial fibrillation: Secondary | ICD-10-CM | POA: Diagnosis not present

## 2020-11-30 ENCOUNTER — Other Ambulatory Visit: Payer: Self-pay | Admitting: Internal Medicine

## 2020-11-30 NOTE — Telephone Encounter (Signed)
Please refill as per office routine med refill policy (all routine meds refilled for 3 mo or monthly per pt preference up to one year from last visit, then month to month grace period for 3 mo, then further med refills will have to be denied)  

## 2020-12-19 ENCOUNTER — Other Ambulatory Visit: Payer: Self-pay

## 2020-12-20 ENCOUNTER — Ambulatory Visit (INDEPENDENT_AMBULATORY_CARE_PROVIDER_SITE_OTHER): Payer: Medicare Other | Admitting: Internal Medicine

## 2020-12-20 ENCOUNTER — Encounter: Payer: Self-pay | Admitting: Internal Medicine

## 2020-12-20 ENCOUNTER — Other Ambulatory Visit: Payer: Self-pay

## 2020-12-20 VITALS — BP 130/78 | HR 60 | Temp 98.6°F | Ht 70.0 in | Wt 227.0 lb

## 2020-12-20 DIAGNOSIS — E559 Vitamin D deficiency, unspecified: Secondary | ICD-10-CM | POA: Diagnosis not present

## 2020-12-20 DIAGNOSIS — I480 Paroxysmal atrial fibrillation: Secondary | ICD-10-CM | POA: Diagnosis not present

## 2020-12-20 DIAGNOSIS — E785 Hyperlipidemia, unspecified: Secondary | ICD-10-CM

## 2020-12-20 DIAGNOSIS — N320 Bladder-neck obstruction: Secondary | ICD-10-CM | POA: Diagnosis not present

## 2020-12-20 DIAGNOSIS — R739 Hyperglycemia, unspecified: Secondary | ICD-10-CM

## 2020-12-20 DIAGNOSIS — I10 Essential (primary) hypertension: Secondary | ICD-10-CM | POA: Diagnosis not present

## 2020-12-20 DIAGNOSIS — F411 Generalized anxiety disorder: Secondary | ICD-10-CM | POA: Diagnosis not present

## 2020-12-20 DIAGNOSIS — M1A9XX0 Chronic gout, unspecified, without tophus (tophi): Secondary | ICD-10-CM

## 2020-12-20 LAB — CBC WITH DIFFERENTIAL/PLATELET
Basophils Absolute: 0 10*3/uL (ref 0.0–0.1)
Basophils Relative: 0.6 % (ref 0.0–3.0)
Eosinophils Absolute: 0.1 10*3/uL (ref 0.0–0.7)
Eosinophils Relative: 2.7 % (ref 0.0–5.0)
HCT: 43 % (ref 39.0–52.0)
Hemoglobin: 14.9 g/dL (ref 13.0–17.0)
Lymphocytes Relative: 27.3 % (ref 12.0–46.0)
Lymphs Abs: 1.4 10*3/uL (ref 0.7–4.0)
MCHC: 34.5 g/dL (ref 30.0–36.0)
MCV: 89.1 fl (ref 78.0–100.0)
Monocytes Absolute: 0.4 10*3/uL (ref 0.1–1.0)
Monocytes Relative: 8.6 % (ref 3.0–12.0)
Neutro Abs: 3 10*3/uL (ref 1.4–7.7)
Neutrophils Relative %: 60.8 % (ref 43.0–77.0)
Platelets: 184 10*3/uL (ref 150.0–400.0)
RBC: 4.83 Mil/uL (ref 4.22–5.81)
RDW: 13.5 % (ref 11.5–15.5)
WBC: 5 10*3/uL (ref 4.0–10.5)

## 2020-12-20 LAB — BASIC METABOLIC PANEL
BUN: 22 mg/dL (ref 6–23)
CO2: 25 mEq/L (ref 19–32)
Calcium: 9.6 mg/dL (ref 8.4–10.5)
Chloride: 107 mEq/L (ref 96–112)
Creatinine, Ser: 1.07 mg/dL (ref 0.40–1.50)
GFR: 70.5 mL/min (ref >60.00)
Glucose, Bld: 93 mg/dL (ref 70–99)
Potassium: 4.6 mEq/L (ref 3.5–5.1)
Sodium: 139 mEq/L (ref 135–145)

## 2020-12-20 LAB — HEPATIC FUNCTION PANEL
ALT: 27 U/L (ref 0–53)
AST: 26 U/L (ref 0–37)
Albumin: 4.4 g/dL (ref 3.5–5.2)
Alkaline Phosphatase: 53 U/L (ref 39–117)
Bilirubin, Direct: 0.1 mg/dL (ref 0.0–0.3)
Total Bilirubin: 0.5 mg/dL (ref 0.2–1.2)
Total Protein: 6.5 g/dL (ref 6.0–8.3)

## 2020-12-20 LAB — LIPID PANEL
Cholesterol: 145 mg/dL (ref 0–200)
HDL: 59.8 mg/dL (ref >39.00)
LDL Cholesterol: 77 mg/dL (ref 0–99)
NonHDL: 84.98
Total CHOL/HDL Ratio: 2
Triglycerides: 42 mg/dL (ref 0.0–149.0)
VLDL: 8.4 mg/dL (ref 0.0–40.0)

## 2020-12-20 LAB — URINALYSIS, ROUTINE W REFLEX MICROSCOPIC
Bilirubin Urine: NEGATIVE
Hgb urine dipstick: NEGATIVE
Ketones, ur: NEGATIVE
Leukocytes,Ua: NEGATIVE
Nitrite: NEGATIVE
RBC / HPF: NONE SEEN (ref 0–?)
Specific Gravity, Urine: 1.02 (ref 1.000–1.030)
Total Protein, Urine: NEGATIVE
Urine Glucose: NEGATIVE
Urobilinogen, UA: 0.2 (ref 0.0–1.0)
pH: 5.5 (ref 5.0–8.0)

## 2020-12-20 LAB — PSA: PSA: 0.58 ng/mL (ref 0.10–4.00)

## 2020-12-20 LAB — HEMOGLOBIN A1C: Hgb A1c MFr Bld: 5.6 % (ref 4.6–6.5)

## 2020-12-20 LAB — URIC ACID: Uric Acid, Serum: 8 mg/dL — ABNORMAL HIGH (ref 4.0–7.8)

## 2020-12-20 LAB — VITAMIN D 25 HYDROXY (VIT D DEFICIENCY, FRACTURES): VITD: 48.29 ng/mL (ref 30.00–100.00)

## 2020-12-20 LAB — TSH: TSH: 2.75 u[IU]/mL (ref 0.35–4.50)

## 2020-12-20 MED ORDER — ATORVASTATIN CALCIUM 10 MG PO TABS: 10.0000 mg | ORAL_TABLET | Freq: Every day | ORAL | 3 refills | Status: DC

## 2020-12-20 MED ORDER — LISINOPRIL 10 MG PO TABS: 10.0000 mg | ORAL_TABLET | Freq: Every day | ORAL | 3 refills | Status: DC

## 2020-12-20 NOTE — Patient Instructions (Addendum)
You had the flu shot today  Please continue all other medications as before, and refills have been done if requested.  Please have the pharmacy call with any other refills you may need.  Please continue your efforts at being more active, low cholesterol diet, and weight control.  You are otherwise up to date with prevention measures today.  Please keep your appointments with your specialists as you may have planned  Please go to the LAB at the blood drawing area for the tests to be done  You will be contacted by phone if any changes need to be made immediately.  Otherwise, you will receive a letter about your results with an explanation, but please check with MyChart first.  Please remember to sign up for MyChart if you have not done so, as this will be important to you in the future with finding out test results, communicating by private email, and scheduling acute appointments online when needed.  Please make an Appointment to return for your 1 year visit, or sooner if needed 

## 2020-12-20 NOTE — Progress Notes (Signed)
Established Patient Office Visit  Subjective:  Patient ID: Todd Mulvehill., male    DOB: 1951/06/14  Age: 70 y.o. MRN: [id number]       Chief Complaint: follow up HTN, HLD and hyperglycemia, gout, vit d deficiency, anxiety       HPI:  Todd Pfenning. is a 70 y.o. 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  Wife with covid illness recently, pt with no symptoms.  BP had been elevated, has seen Dr Forest Gleason for afib in past, seen for 3 yr check up April 2021; asked to get BP machine for home, then monitor for 30 day but no recurring afib, thought related overall more to stress and anxiety.  BP today is improved.   Has lost several lbs with better diet.  Pt denies chest pain, increased sob or doe, wheezing, orthopnea, PND, increased LE swelling, palpitations, dizziness or syncope.   Pt denies polydipsia, polyuria, Pt denies new neurological symptoms such as new headache, or facial or extremity weakness or numbness  No recent gout joint symptoms of pain or swelling  Denies worsening depressive symptoms, suicidal ideation, or panic; has ongoing anxiety.       Wt Readings from Last 3 Encounters:  12/21/20 227 lb (103 kg)  12/20/20 227 lb (103 kg)  03/06/20 231 lb (104.8 kg)   BP Readings from Last 3 Encounters:  12/21/20 110/75  12/20/20 130/78  03/28/20 116/68         Past Medical History:  Diagnosis Date  . ALLERGIC RHINITIS 01/08/2010  . Allergy    dust mites, pet dander  . ANEMIA-NOS 01/08/2010  . Atrial fibrillation with RVR (HCC) 01/19/2018  . CERVICAL RADICULOPATHY, RIGHT 01/08/2010  . CHEST PAIN 01/08/2010  . DIVERTICULOSIS, COLON 01/08/2010  . Dysrhythmia    post op   during PT a fib RVR  .  GERD (gastroesophageal reflux disease)    Zantac OTC  prn  . GOUT 01/08/2010   in past/ when has surgery  . HYPERLIPIDEMIA 01/08/2010  . Lumbar disc disease 08/05/2011  . Osteoarthritis of right knee    end stage  . OSTEOARTHRITIS, KNEES, 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   Past Surgical History:  Procedure Laterality Date  . ANTERIOR CERVICAL DECOMP/DISCECTOMY FUSION  07/23/2012   Procedure: ANTERIOR CERVICAL DECOMPRESSION/DISCECTOMY FUSION 2 LEVELS;  Surgeon: Clydene Fake, MD;  Location: [facility];  Service: Neurosurgery;  Laterality: N/A;  Cervical three-four, four-five, redo Cervical six-seven anterior cervical decompression, discectomy fusion with allograft and plating  . BACK SURGERY  03/2004, 02/2005  . CERVICAL SPINE SURGERY  05/2010  . clavicle facture  nov. 2009   100% displacement  . COLONOSCOPY    . HERNIA REPAIR     inguinal hernia/right side/ 70 years old  . JOINT REPLACEMENT     Left knee 2006  . KNEE ARTHROSCOPY     total of 5  . left knee arthroscopy  september 2005  . lumbar disc surgury  02/2002  . lumbar disc surgury  june 2004  . NASAL RECONSTRUCTION     x 3  . REVERSE SHOULDER ARTHROPLASTY Left 07/30/2018   Procedure: LEFT REVERSE SHOULDER ARTHROPLASTY;  Surgeon: Francena Hanly, MD;  Location:  [facility] OR;  Service: Orthopedics;  Laterality: Left;   . s/p ENT surgury     x 3  . s/p left shoulder surgury  feb. 2009  . s/p left TKR  april 2006  . TOTAL KNEE ARTHROPLASTY Right 01/16/2018   Procedure: RIGHT TOTAL KNEE ARTHROPLASTY;  Surgeon: Beverely Low, MD;  Location: [facility] OR;  Service: Orthopedics;  Laterality: Right;  . TOTAL SHOULDER ARTHROPLASTY Right 09/16/2019   Procedure: Right Reverse shoulder arthroplasty;  Surgeon: Francena Hanly, MD;  Location: [facility] ORS;  Service: Orthopedics;  Laterality: Right;    reports that he has never smoked. He has never used smokeless tobacco. He reports current alcohol use of about 12.0 standard drinks of alcohol per week. He reports that he  does not use drugs. family history includes Alzheimer's disease in his mother; Bradycardia (age of onset: 55) in his father; Lymphoma in his mother; Pancreatic cancer in his brother. Allergies  Allergen Reactions  . Dust Mite Extract Other (See Comments)    Nasal Congestion  . Other Other (See Comments)    Pet Dander- Runny nose   Current Outpatient Medications on File Prior to Visit  Medication Sig Dispense Refill  . fexofenadine (ALLEGRA) 180 MG tablet Take 180 mg by mouth daily.    . fluticasone (FLONASE) 50 MCG/ACT nasal spray Place 2 sprays into the nose daily as needed for allergies. For allergies.    . naproxen (NAPROSYN) 500 MG tablet Take 1 tablet (500 mg total) by mouth 2 (two) times daily with a meal. 60 tablet 1  . Omega-3 Fatty Acids (FISH OIL) 1200 MG CPDR Take 3,600 mg by mouth daily.     No current facility-administered medications on file prior to visit.        ROS:  All others reviewed and negative.  Objective        PE:  BP 130/78   Pulse 60   Temp 98.6 F (37 C) (Temporal)   Ht 5\' 10"  (1.778 m)   Wt 227 lb (103 kg)   SpO2 98%   BMI 32.57 kg/m                 Constitutional: Pt appears in NAD               HENT: Head: NCAT.                Right Ear: External ear normal.                 Left Ear: External ear normal.                Eyes: . Pupils are equal, round, and reactive to light. Conjunctivae and EOM are normal               Nose: without d/c or deformity               Neck: Neck supple. Gross normal ROM               Cardiovascular: Normal rate and regular rhythm.                 Pulmonary/Chest: Effort normal and breath sounds without rales or wheezing.                Abd:  Soft, NT, ND, + BS, no organomegaly               Neurological: Pt is alert. At baseline orientation, motor grossly intact  Skin: Skin is warm. No rashes, no other new lesions, LE edema - none               Psychiatric: Pt behavior is normal without agitation    Assessment/Plan:  Todd Masso. is a 70 y.o. White or Caucasian [1] male with  has a past medical history of ALLERGIC RHINITIS (01/08/2010), Allergy, ANEMIA-NOS (01/08/2010), Atrial fibrillation with RVR (HCC) (01/19/2018), CERVICAL RADICULOPATHY, RIGHT (01/08/2010), CHEST PAIN (01/08/2010), DIVERTICULOSIS, COLON (01/08/2010), Dysrhythmia, GERD (gastroesophageal reflux disease), GOUT (01/08/2010), HYPERLIPIDEMIA (01/08/2010), Lumbar disc disease (08/05/2011), Osteoarthritis of right knee, and OSTEOARTHRITIS, KNEES, BILATERAL (01/08/2010).   Micro: none  Cardiac tracings I have personally interpreted today:  none  Pertinent Radiological findings (summarize): none   Lab Results  Component Value Date   WBC 7.5 12/21/2020   HGB 15.8 12/21/2020   HCT 45.6 12/21/2020   PLT 172 12/21/2020   GLUCOSE 154 (H) 12/21/2020   CHOL 145 12/20/2020   TRIG 42.0 12/20/2020   HDL 59.80 12/20/2020   LDLDIRECT 142.0 09/29/2012   LDLCALC 77 12/20/2020   ALT 50 (H) 12/21/2020   AST 74 (H) 12/21/2020   NA 138 12/21/2020   K 4.4 12/21/2020   CL 106 12/21/2020   CREATININE 1.15 12/21/2020   BUN 22 12/21/2020   CO2 22 12/21/2020   TSH 1.577 12/21/2020   PSA 0.58 12/20/2020   INR 1.03 07/23/2012   HGBA1C 5.6 12/20/2020   MICROALBUR 1.0 11/27/2016      Assessment & Plan:   Problem List Items Addressed This Visit      Medium   Vitamin D deficiency    Last vitamin D Lab Results  Component Value Date   VD25OH 48.29 12/20/2020   Stable, cont oral replacement      Relevant Orders   VITAMIN D 25 Hydroxy (Vit-D Deficiency, Fractures) (Completed)   Hypertension    BP Readings from Last 3 Encounters:  12/21/20 110/75  12/20/20 130/78  03/28/20 116/68   Stable, pt to continue medical treatment  - lisinopril   Current Outpatient Medications (Cardiovascular):  .  atorvastatin (LIPITOR) 10 MG tablet, Take 1 tablet (10 mg total) by mouth daily. Marland Kitchen  lisinopril (ZESTRIL) 10 MG tablet, Take 1  tablet (10 mg total) by mouth daily.  Current Outpatient Medications (Respiratory):  .  fexofenadine (ALLEGRA) 180 MG tablet, Take 180 mg by mouth daily. .  fluticasone (FLONASE) 50 MCG/ACT nasal spray, Place 2 sprays into the nose daily as needed for allergies. For allergies.  Current Outpatient Medications (Analgesics):  .  naproxen (NAPROSYN) 500 MG tablet, Take 1 tablet (500 mg total) by mouth 2 (two) times daily with a meal.  Current Outpatient Medications (Hematological):  .  apixaban (ELIQUIS) 5 MG TABS tablet, Take 1 tablet (5 mg total) by mouth 2 (two) times daily.  Current Outpatient Medications (Other):  Marland Kitchen  Omega-3 Fatty Acids (FISH OIL) 1200 MG CPDR, Take 3,600 mg by mouth daily.       Relevant Medications   atorvastatin (LIPITOR) 10 MG tablet   lisinopril (ZESTRIL) 10 MG tablet   Hyperlipemia    Lab Results  Component Value Date   LDLCALC 77 12/20/2020   Stable, pt to continue current statin lipitor 10   Current Outpatient Medications (Cardiovascular):  .  atorvastatin (LIPITOR) 10 MG tablet, Take 1 tablet (10 mg total) by mouth daily. Marland Kitchen  lisinopril (ZESTRIL) 10 MG tablet, Take 1 tablet (10 mg total) by mouth daily.  Current Outpatient Medications (Respiratory):  .  fexofenadine (ALLEGRA) 180 MG tablet, Take 180 mg by mouth daily. .  fluticasone (FLONASE) 50 MCG/ACT nasal spray, Place 2 sprays into the nose daily as needed for allergies. For allergies.  Current Outpatient Medications (Analgesics):  .  naproxen (NAPROSYN) 500 MG tablet, Take 1 tablet (500 mg total) by mouth 2 (two) times daily with a meal.  Current Outpatient Medications (Hematological):  .  apixaban (ELIQUIS) 5 MG TABS tablet, Take 1 tablet (5 mg total) by mouth 2 (two) times daily.  Current Outpatient Medications (Other):  Marland Kitchen  Omega-3 Fatty Acids (FISH OIL) 1200 MG CPDR, Take 3,600 mg by mouth daily.       Relevant Medications   atorvastatin (LIPITOR) 10 MG tablet   lisinopril (ZESTRIL)  10 MG tablet   Other Relevant Orders   Lipid panel (Completed)   Hepatic function panel (Completed)   CBC with Differential/Platelet (Completed)   TSH (Completed)   Urinalysis, Routine w reflex microscopic (Completed)   Basic metabolic panel (Completed)   Hyperglycemia - Primary    Lab Results  Component Value Date   HGBA1C 5.6 12/20/2020   Stable, pt to continue current medical treatment - diet   Current Outpatient Medications (Cardiovascular):  .  atorvastatin (LIPITOR) 10 MG tablet, Take 1 tablet (10 mg total) by mouth daily. Marland Kitchen  lisinopril (ZESTRIL) 10 MG tablet, Take 1 tablet (10 mg total) by mouth daily.  Current Outpatient Medications (Respiratory):  .  fexofenadine (ALLEGRA) 180 MG tablet, Take 180 mg by mouth daily. .  fluticasone (FLONASE) 50 MCG/ACT nasal spray, Place 2 sprays into the nose daily as needed for allergies. For allergies.  Current Outpatient Medications (Analgesics):  .  naproxen (NAPROSYN) 500 MG tablet, Take 1 tablet (500 mg total) by mouth 2 (two) times daily with a meal.  Current Outpatient Medications (Hematological):  .  apixaban (ELIQUIS) 5 MG TABS tablet, Take 1 tablet (5 mg total) by mouth 2 (two) times daily.  Current Outpatient Medications (Other):  Marland Kitchen  Omega-3 Fatty Acids (FISH OIL) 1200 MG CPDR, Take 3,600 mg by mouth daily.      Relevant Orders   Hemoglobin A1c (Completed)   GOUT    Stable,  to f/u any worsening symptoms or concerns      Relevant Orders   Uric acid (Completed)   Anxiety state    Overall stable, mild, does not require specific tx for now        Other Visit Diagnoses    Bladder neck obstruction       Relevant Orders   PSA (Completed)   PAF (paroxysmal atrial fibrillation) (HCC)       Relevant Medications   atorvastatin (LIPITOR) 10 MG tablet   lisinopril (ZESTRIL) 10 MG tablet      Meds ordered this encounter  Medications  . atorvastatin (LIPITOR) 10 MG tablet    Sig: Take 1 tablet (10 mg total) by mouth  daily.    Dispense:  90 tablet    Refill:  3  . lisinopril (ZESTRIL) 10 MG tablet    Sig: Take 1 tablet (10 mg total) by mouth daily.    Dispense:  90 tablet    Refill:  3    Follow-up: Return in about 1 year (around 12/20/2021).   Oliver Barre, MD 12/23/2020 10:27 PM [facility]

## 2020-12-21 ENCOUNTER — Emergency Department (HOSPITAL_COMMUNITY)
Admission: EM | Admit: 2020-12-21 | Discharge: 2020-12-21 | Disposition: A | Discharge status: Home / Self Care | Payer: Medicare Other | Attending: Emergency Medicine | Admitting: Emergency Medicine

## 2020-12-21 ENCOUNTER — Other Ambulatory Visit: Payer: Self-pay

## 2020-12-21 ENCOUNTER — Encounter (HOSPITAL_COMMUNITY): Payer: Self-pay | Admitting: Emergency Medicine

## 2020-12-21 ENCOUNTER — Emergency Department (HOSPITAL_COMMUNITY): Payer: Medicare Other

## 2020-12-21 DIAGNOSIS — R5381 Other malaise: Secondary | ICD-10-CM | POA: Diagnosis not present

## 2020-12-21 DIAGNOSIS — I10 Essential (primary) hypertension: Secondary | ICD-10-CM | POA: Diagnosis not present

## 2020-12-21 DIAGNOSIS — I4891 Unspecified atrial fibrillation: Secondary | ICD-10-CM | POA: Diagnosis not present

## 2020-12-21 DIAGNOSIS — R1013 Epigastric pain: Secondary | ICD-10-CM | POA: Diagnosis not present

## 2020-12-21 DIAGNOSIS — Z96651 Presence of right artificial knee joint: Secondary | ICD-10-CM | POA: Insufficient documentation

## 2020-12-21 DIAGNOSIS — Z7901 Long term (current) use of anticoagulants: Secondary | ICD-10-CM | POA: Insufficient documentation

## 2020-12-21 DIAGNOSIS — Z79899 Other long term (current) drug therapy: Secondary | ICD-10-CM | POA: Diagnosis not present

## 2020-12-21 DIAGNOSIS — K219 Gastro-esophageal reflux disease without esophagitis: Secondary | ICD-10-CM | POA: Insufficient documentation

## 2020-12-21 LAB — CBC
HCT: 45.6 % (ref 39.0–52.0)
Hemoglobin: 15.8 g/dL (ref 13.0–17.0)
MCH: 30.4 pg (ref 26.0–34.0)
MCHC: 34.6 g/dL (ref 30.0–36.0)
MCV: 87.7 fL (ref 80.0–100.0)
Platelets: 172 10*3/uL (ref 150–400)
RBC: 5.2 MIL/uL (ref 4.22–5.81)
RDW: 12.6 % (ref 11.5–15.5)
WBC: 7.5 10*3/uL (ref 4.0–10.5)
nRBC: 0 % (ref 0.0–0.2)

## 2020-12-21 LAB — COMPREHENSIVE METABOLIC PANEL
ALT: 50 U/L — ABNORMAL HIGH (ref 0–44)
AST: 74 U/L — ABNORMAL HIGH (ref 15–41)
Albumin: 3.8 g/dL (ref 3.5–5.0)
Alkaline Phosphatase: 56 U/L (ref 38–126)
Anion gap: 10 (ref 5–15)
BUN: 22 mg/dL (ref 8–23)
CO2: 22 mmol/L (ref 22–32)
Calcium: 9.5 mg/dL (ref 8.9–10.3)
Chloride: 106 mmol/L (ref 98–111)
Creatinine, Ser: 1.15 mg/dL (ref 0.61–1.24)
GFR, Estimated: >60 mL/min (ref >60)
Glucose, Bld: 154 mg/dL — ABNORMAL HIGH (ref 70–99)
Potassium: 4.4 mmol/L (ref 3.5–5.1)
Sodium: 138 mmol/L (ref 135–145)
Total Bilirubin: 1.3 mg/dL — ABNORMAL HIGH (ref 0.3–1.2)
Total Protein: 6.4 g/dL — ABNORMAL LOW (ref 6.5–8.1)

## 2020-12-21 LAB — URINALYSIS, ROUTINE W REFLEX MICROSCOPIC
Bilirubin Urine: NEGATIVE
Glucose, UA: NEGATIVE mg/dL
Hgb urine dipstick: NEGATIVE
Ketones, ur: 5 mg/dL — AB
Leukocytes,Ua: NEGATIVE
Nitrite: NEGATIVE
Protein, ur: NEGATIVE mg/dL
Specific Gravity, Urine: 1.026 (ref 1.005–1.030)
pH: 5 (ref 5.0–8.0)

## 2020-12-21 LAB — TROPONIN I (HIGH SENSITIVITY)
Troponin I (High Sensitivity): 8 ng/L (ref <18)
Troponin I (High Sensitivity): 7 ng/L (ref <18)

## 2020-12-21 LAB — LIPASE, BLOOD: Lipase: 34 U/L (ref 11–51)

## 2020-12-21 LAB — TSH: TSH: 1.577 u[IU]/mL (ref 0.350–4.500)

## 2020-12-21 MED ORDER — APIXABAN 5 MG PO TABS: 5.0000 mg | ORAL_TABLET | Freq: Two times a day (BID) | ORAL | 0 refills | Status: DC

## 2020-12-21 NOTE — ED Notes (Signed)
Patient verbalizes understanding of discharge instructions. Opportunity for questioning and answers were provided. Armband removed by staff, pt discharged from ED and ambulated to lobby to return home.   

## 2020-12-21 NOTE — Discharge Instructions (Signed)
Follow-up with either Dr. Eldridge Dace or the A. fib clinic.  Watch for a fast heart rate.  You also may want to take care with the NSAIDs such as naproxen now but will be on a blood thinner.

## 2020-12-21 NOTE — ED Provider Notes (Signed)
[facility] EMERGENCY DEPARTMENT Provider Note   CSN: [id number] Arrival date & time: 12/21/20  0945     History Chief Complaint  Patient presents with  . Abdominal Pain    Todd Quam. is a 70 y.o. male.  HPI Patient presents with epigastric abdominal pain.  Began acutely.  States began after having a verbal this morning which is not unusual for him.  States it is basically his coffee.  Pain was severe in the epigastric area.  Now pain is completely resolved.  There has been no radiation.  Not had pain like this before.  No chest pain.  No lightheadedness or dizziness.  No trouble breathing.  No nausea vomiting diarrhea.  However appears to be in atrial fibrillation.  Had a history of A. fib years ago when he had been postsurgical for knee surgery.  States that he has not had episodes since then and wore a monitor that did not show it.  No numbness or weakness.  Not had issues with food before this.  Does take a fair amount of NSAIDs due to chronic Ortho pain.    Past Medical History:  Diagnosis Date  . ALLERGIC RHINITIS 01/08/2010  . Allergy    dust mites, pet dander  . ANEMIA-NOS 01/08/2010  . Atrial fibrillation with RVR (HCC) 01/19/2018  . CERVICAL RADICULOPATHY, RIGHT 01/08/2010  . CHEST PAIN 01/08/2010  . DIVERTICULOSIS, COLON 01/08/2010  . Dysrhythmia    post op   during PT a fib RVR  . GERD (gastroesophageal reflux disease)    Zantac OTC  prn  . GOUT 01/08/2010   in past/ when has surgery  . HYPERLIPIDEMIA 01/08/2010  . Lumbar disc disease 08/05/2011  . Osteoarthritis of right knee    end stage  . OSTEOARTHRITIS, KNEES, 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    Patient Active Problem List   Diagnosis Date Noted  . Vitamin D deficiency 12/20/2020  . Hypertension 03/28/2020  . 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 S/p reverse total shoulder arthroplasty 07/30/2018  . Atrial fibrillation with RVR (HCC) 01/19/2018  . 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 Osteoarthritis of right knee 12/15/2017  . Hyperglycemia 12/04/2016  . Lumbar disc disease 08/05/2011  . Cervical disc disease 08/05/2011  . Preventative health care 08/02/2011  . Hyperlipemia 01/08/2010  . GOUT 01/08/2010  . Anemia 01/08/2010  . Anxiety state 01/08/2010  . Allergic rhinitis 01/08/2010  . DIVERTICULOSIS, COLON 01/08/2010  . 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 CERVICAL RADICULOPATHY, RIGHT 01/08/2010  . CHEST PAIN 01/08/2010    Past Surgical History:  Procedure Laterality Date  . ANTERIOR CERVICAL DECOMP/DISCECTOMY FUSION  07/23/2012   Procedure: ANTERIOR CERVICAL DECOMPRESSION/DISCECTOMY FUSION 2 LEVELS;  Surgeon: Clydene Fake, MD;  Location: [facility];  Service: Neurosurgery;  Laterality: N/A;  Cervical three-four, four-five, redo Cervical six-seven anterior cervical decompression, discectomy fusion with allograft and plating  . BACK SURGERY  03/2004, 02/2005  . CERVICAL SPINE SURGERY  05/2010  . clavicle facture  nov. 2009   100% displacement  . COLONOSCOPY    . HERNIA REPAIR     inguinal hernia/right side/ 70 years old  . JOINT REPLACEMENT     Left knee 2006  . KNEE ARTHROSCOPY     total of 5  . left knee arthroscopy  september 2005  . lumbar disc surgury  02/2002  . lumbar disc surgury  june 2004  . NASAL RECONSTRUCTION     x  3  . REVERSE SHOULDER ARTHROPLASTY Left 07/30/2018   Procedure: LEFT REVERSE SHOULDER ARTHROPLASTY;  Surgeon: Francena Hanly, MD;  Location: [facility] OR;  Service: Orthopedics;  Laterality: Left;   . s/p ENT surgury     x 3  . s/p left shoulder surgury  feb. 2009  . s/p left TKR  april 2006  . TOTAL KNEE ARTHROPLASTY Right 01/16/2018   Procedure: RIGHT TOTAL KNEE ARTHROPLASTY;  Surgeon: Beverely Low, MD;  Location: [facility];  Service: Orthopedics;  Laterality: Right;  . TOTAL SHOULDER ARTHROPLASTY Right 09/16/2019   Procedure: Right Reverse shoulder arthroplasty;  Surgeon: Francena Hanly, MD;  Location: [facility] ORS;  Service: Orthopedics;  Laterality: Right;       Family History  Problem Relation Age of Onset  . Bradycardia Father 60       Pacemaker  . Lymphoma Mother   . Alzheimer's disease Mother   . Pancreatic cancer Brother   . Colon cancer Neg Hx     Social History   Tobacco Use  . Smoking status: Never Smoker  . Smokeless tobacco: Never Used  Vaping Use  . Vaping Use: Never used  Substance Use Topics  . Alcohol use: Yes    Alcohol/week: 12.0 standard drinks    Types: 12 Cans of beer per week    Comment: 3-4 days a week  . Drug use: No    Home Medications Prior to Admission medications   Medication Sig Start Date End Date Taking? Authorizing Provider  apixaban (ELIQUIS) 5 MG TABS tablet Take 1 tablet (5 mg total) by mouth 2 (two) times daily. 12/21/20 01/20/21 Yes Benjiman Core, MD  atorvastatin (LIPITOR) 10 MG tablet Take 1 tablet (10 mg total) by mouth daily. 12/20/20   Corwin Levins, MD  fexofenadine (ALLEGRA) 180 MG tablet Take 180 mg by mouth daily.    [provider]  fluticasone (FLONASE) 50 MCG/ACT nasal spray Place 2 sprays into the nose daily as needed for allergies. For allergies.    [provider]  lisinopril (ZESTRIL) 10 MG tablet Take 1 tablet (10 mg total) by mouth daily. 12/20/20   Corwin Levins, MD  naproxen (NAPROSYN) 500 MG tablet Take 1 tablet (500 mg total) by mouth 2 (two) times daily with a meal. 09/17/19   Shuford, French Ana, PA-C  Omega-3 Fatty Acids (FISH OIL) 1200 MG CPDR Take 3,600 mg by mouth daily.    [provider]    Allergies    Dust mite extract and Other  Review of Systems   Review of Systems  Constitutional: Negative for appetite change.  HENT: Negative for congestion.   Respiratory: Negative for cough and shortness of breath.   Cardiovascular: Negative for chest pain and palpitations.  Gastrointestinal: Positive for abdominal pain.  Genitourinary: Negative for flank pain.   Musculoskeletal: Negative for back pain.  Skin: Negative for rash.  Neurological: Negative for weakness.  Psychiatric/Behavioral: Negative for confusion.    Physical Exam Updated Vital Signs BP 110/75   Pulse (!) 16   Temp 97.9 F (36.6 C) (Oral)   Resp 16   Ht 5\' 10"  (1.778 m)   Wt 103 kg   SpO2 97%   BMI 32.57 kg/m   Physical Exam Vitals and nursing note reviewed.  HENT:     Head: Atraumatic.  Eyes:     Extraocular Movements: Extraocular movements intact.  Cardiovascular:     Rate and Rhythm: Normal rate. Rhythm irregular.  Pulmonary:     Breath  sounds: No wheezing or rhonchi.  Abdominal:     General: There is no distension.     Palpations: Abdomen is soft. There is no pulsatile mass.     Tenderness: There is no abdominal tenderness.     Hernia: No hernia is present.  Skin:    General: Skin is warm.     Capillary Refill: Capillary refill takes less than 2 seconds.  Neurological:     Mental Status: He is alert and oriented to person, place, and time.  Psychiatric:        Mood and Affect: Mood normal.     ED Results / Procedures / Treatments   Labs (all labs ordered are listed, but only abnormal results are displayed) Labs Reviewed  COMPREHENSIVE METABOLIC PANEL - Abnormal; Notable for the following components:      Result Value   Glucose, Bld 154 (*)    Total Protein 6.4 (*)    AST 74 (*)    ALT 50 (*)    Total Bilirubin 1.3 (*)    All other components within normal limits  LIPASE, BLOOD  CBC  URINALYSIS, ROUTINE W REFLEX MICROSCOPIC  TSH  TROPONIN I (HIGH SENSITIVITY)  TROPONIN I (HIGH SENSITIVITY)    EKG EKG Interpretation  Date/Time:  Thursday December 21 2020 10:01:17 EST Ventricular Rate:  91 PR Interval:    QRS Duration: 88 QT Interval:  362 QTC Calculation: 445 R Axis:   15 Text Interpretation: Atrial fibrillation Nonspecific T wave abnormality Abnormal ECG Confirmed by Benjiman Core [phone]) on 12/21/2020 10:20:29  AM   Radiology DG Chest 2 View  Result Date: 12/21/2020 CLINICAL DATA:  Epigastric pain EXAM: CHEST - 2 VIEW COMPARISON:  05/31/2010 FINDINGS: Cardiac shadow is within normal limits. Aortic calcifications are seen. The lungs are well aerated bilaterally. No infiltrate or effusion is seen. Degenerative changes of the thoracic spine noted. Bilateral shoulder replacements are seen. IMPRESSION: No acute abnormality noted. Electronically Signed   By: Alcide Clever M.D.   On: 12/21/2020 11:09    Procedures Procedures   Medications Ordered in ED Medications - No data to display  ED Course  I have reviewed the triage vital signs and the nursing notes.  Pertinent labs & imaging results that were available during my care of the patient were reviewed by me and considered in my medical decision making (see chart for details).    MDM Rules/Calculators/A&P                          Patient presents with epigastric abdominal pain.  Resolved.  Nontender.  Lab work reassuring.  No further pain.  Is not have episodes like this before.  Less worried if something like mesenteric ischemia or bowel obstruction with pain completely resolved.  Doubt cardiac cause.  Biliary issue considered but also is resolved and has not had other issues.  Lab work reassuring for this.  Well not get imaging at this time.  However was found to be in atrial fibrillation.  Rate controlled.  History of A. fib in the past but only after knee surgery.  CHA2DS2-VASc score of 2.  Will treat with anticoagulation.  Will start Eliquis.  Outpatient follow-up with either A. fib clinic or his cardiologist.  However not a cardioversion candidate since unsure of time of onset.  Patient cannot tell me when it started or does not feel different now. Final Clinical Impression(s) / ED Diagnoses Final diagnoses:  Atrial fibrillation, unspecified  type [facility])  Epigastric pain    Rx / DC Orders ED Discharge Orders         Ordered    apixaban  (ELIQUIS) 5 MG TABS tablet  2 times daily        12/21/20 1249           Benjiman Core, MD 12/21/20 1256

## 2020-12-21 NOTE — ED Triage Notes (Signed)
Patient BIB [facility] from home. Complaint of sudden onset epigastric pain. Patient pain 10/10 on scene. Received 324 aspirin via EMS. Pain 0/10 upon arrival to department. VSS. NAD.

## 2020-12-23 ENCOUNTER — Encounter: Payer: Self-pay | Admitting: Internal Medicine

## 2020-12-23 NOTE — Assessment & Plan Note (Signed)
Last vitamin D Lab Results  Component Value Date   VD25OH 48.29 12/20/2020   Stable, cont oral replacement

## 2020-12-23 NOTE — Assessment & Plan Note (Signed)
BP Readings from Last 3 Encounters:  12/21/20 110/75  12/20/20 130/78  03/28/20 116/68   Stable, pt to continue medical treatment  - lisinopril   Current Outpatient Medications (Cardiovascular):  .  atorvastatin (LIPITOR) 10 MG tablet, Take 1 tablet (10 mg total) by mouth daily. Marland Kitchen  lisinopril (ZESTRIL) 10 MG tablet, Take 1 tablet (10 mg total) by mouth daily.  Current Outpatient Medications (Respiratory):  .  fexofenadine (ALLEGRA) 180 MG tablet, Take 180 mg by mouth daily. .  fluticasone (FLONASE) 50 MCG/ACT nasal spray, Place 2 sprays into the nose daily as needed for allergies. For allergies.  Current Outpatient Medications (Analgesics):  .  naproxen (NAPROSYN) 500 MG tablet, Take 1 tablet (500 mg total) by mouth 2 (two) times daily with a meal.  Current Outpatient Medications (Hematological):  .  apixaban (ELIQUIS) 5 MG TABS tablet, Take 1 tablet (5 mg total) by mouth 2 (two) times daily.  Current Outpatient Medications (Other):  Marland Kitchen  Omega-3 Fatty Acids (FISH OIL) 1200 MG CPDR, Take 3,600 mg by mouth daily.

## 2020-12-23 NOTE — Assessment & Plan Note (Signed)
Stable,  to f/u any worsening symptoms or concerns 

## 2020-12-23 NOTE — Assessment & Plan Note (Signed)
Lab Results  Component Value Date   LDLCALC 77 12/20/2020   Stable, pt to continue current statin lipitor 10   Current Outpatient Medications (Cardiovascular):  .  atorvastatin (LIPITOR) 10 MG tablet, Take 1 tablet (10 mg total) by mouth daily. Marland Kitchen  lisinopril (ZESTRIL) 10 MG tablet, Take 1 tablet (10 mg total) by mouth daily.  Current Outpatient Medications (Respiratory):  .  fexofenadine (ALLEGRA) 180 MG tablet, Take 180 mg by mouth daily. .  fluticasone (FLONASE) 50 MCG/ACT nasal spray, Place 2 sprays into the nose daily as needed for allergies. For allergies.  Current Outpatient Medications (Analgesics):  .  naproxen (NAPROSYN) 500 MG tablet, Take 1 tablet (500 mg total) by mouth 2 (two) times daily with a meal.  Current Outpatient Medications (Hematological):  .  apixaban (ELIQUIS) 5 MG TABS tablet, Take 1 tablet (5 mg total) by mouth 2 (two) times daily.  Current Outpatient Medications (Other):  Marland Kitchen  Omega-3 Fatty Acids (FISH OIL) 1200 MG CPDR, Take 3,600 mg by mouth daily.

## 2020-12-23 NOTE — Assessment & Plan Note (Signed)
Overall stable, mild, does not require specific tx for now

## 2020-12-23 NOTE — Assessment & Plan Note (Signed)
Lab Results  Component Value Date   HGBA1C 5.6 12/20/2020   Stable, pt to continue current medical treatment - diet   Current Outpatient Medications (Cardiovascular):  .  atorvastatin (LIPITOR) 10 MG tablet, Take 1 tablet (10 mg total) by mouth daily. Marland Kitchen  lisinopril (ZESTRIL) 10 MG tablet, Take 1 tablet (10 mg total) by mouth daily.  Current Outpatient Medications (Respiratory):  .  fexofenadine (ALLEGRA) 180 MG tablet, Take 180 mg by mouth daily. .  fluticasone (FLONASE) 50 MCG/ACT nasal spray, Place 2 sprays into the nose daily as needed for allergies. For allergies.  Current Outpatient Medications (Analgesics):  .  naproxen (NAPROSYN) 500 MG tablet, Take 1 tablet (500 mg total) by mouth 2 (two) times daily with a meal.  Current Outpatient Medications (Hematological):  .  apixaban (ELIQUIS) 5 MG TABS tablet, Take 1 tablet (5 mg total) by mouth 2 (two) times daily.  Current Outpatient Medications (Other):  Marland Kitchen  Omega-3 Fatty Acids (FISH OIL) 1200 MG CPDR, Take 3,600 mg by mouth daily.

## 2020-12-25 ENCOUNTER — Other Ambulatory Visit: Payer: Self-pay

## 2020-12-25 ENCOUNTER — Ambulatory Visit (INDEPENDENT_AMBULATORY_CARE_PROVIDER_SITE_OTHER): Payer: Medicare Other

## 2020-12-25 VITALS — Ht 70.0 in | Wt 227.0 lb

## 2020-12-25 DIAGNOSIS — Z0000 Encounter for general adult medical examination without abnormal findings: Secondary | ICD-10-CM | POA: Diagnosis not present

## 2020-12-25 NOTE — Patient Instructions (Addendum)
[name] , Thank you for taking time to come for your Medicare Wellness Visit. I appreciate your ongoing commitment to your health goals. Please review the following plan we discussed and let me know if I can assist you in the future.   Screening recommendations/referrals: Colonoscopy: 03/04/2017; due every 10 years (2028) Recommended yearly ophthalmology/optometry visit for glaucoma screening and checkup Recommended yearly dental visit for hygiene and checkup  Vaccinations: Influenza vaccine: 12/20/2020 Pneumococcal vaccine: 12/15/2015, 12/04/2016 (up to date) Tdap vaccine: 12/17/2018; due every 10 years (2030) Shingles vaccine: never done   Covid-19: 01/17/2020, 02/14/2020, 08/07/2020  Advanced directives: Please bring a copy of your health care power of attorney and living will to the office at your convenience.  Conditions/risks identified: Yes; Reviewed health maintenance screenings with patient today and relevant education, vaccines, and/or referrals were provided. Please continue to do your personal lifestyle choices by: daily care of teeth and gums, regular physical activity (goal should be 5 days a week for 30 minutes), eat a healthy diet, avoid tobacco and drug use, limiting any alcohol intake, taking a low-dose aspirin (if not allergic or have been advised by your provider otherwise) and taking vitamins and minerals as recommended by your provider. Continue doing brain stimulating activities (puzzles, reading, adult coloring books, staying active) to keep memory sharp. Continue to eat heart healthy diet (full of fruits, vegetables, whole grains, lean protein, water--limit salt, fat, and sugar intake) and increase physical activity as tolerated.  Next appointment: Please schedule your next Medicare Wellness Visit with your Nurse Health Advisor in 1 year by calling [phone]. Preventive Care 71 Years and Older, Male Preventive care refers to lifestyle choices and visits with your health  care provider that can promote health and wellness. What does preventive care include?  A yearly physical exam. This is also called an annual well check.  Dental exams once or twice a year.  Routine eye exams. Ask your health care provider how often you should have your eyes checked.  Personal lifestyle choices, including:  Daily care of your teeth and gums.  Regular physical activity.  Eating a healthy diet.  Avoiding tobacco and drug use.  Limiting alcohol use.  Practicing safe sex.  Taking low doses of aspirin every day.  Taking vitamin and mineral supplements as recommended by your health care provider. What happens during an annual well check? The services and screenings done by your health care provider during your annual well check will depend on your age, overall health, lifestyle risk factors, and family history of disease. Counseling  Your health care provider may ask you questions about your:  Alcohol use.  Tobacco use.  Drug use.  Emotional well-being.  Home and relationship well-being.  Sexual activity.  Eating habits.  History of falls.  Memory and ability to understand (cognition).  Work and work Astronomer. Screening  You may have the following tests or measurements:  Height, weight, and BMI.  Blood pressure.  Lipid and cholesterol levels. These may be checked every 5 years, or more frequently if you are over 66 years old.  Skin check.  Lung cancer screening. You may have this screening every year starting at age 65 if you have a 30-pack-year history of smoking and currently smoke or have quit within the past 15 years.  Fecal occult blood test (FOBT) of the stool. You may have this test every year starting at age 71.  Flexible sigmoidoscopy or colonoscopy. You may have a sigmoidoscopy every 5 years or a colonoscopy  every 10 years starting at age 57.  Prostate cancer screening. Recommendations will vary depending on your family  history and other risks.  Hepatitis C blood test.  Hepatitis B blood test.  Sexually transmitted disease (STD) testing.  Diabetes screening. This is done by checking your blood sugar (glucose) after you have not eaten for a while (fasting). You may have this done every 1-3 years.  Abdominal aortic aneurysm (AAA) screening. You may need this if you are a current or former smoker.  Osteoporosis. You may be screened starting at age 60 if you are at high risk. Talk with your health care provider about your test results, treatment options, and if necessary, the need for more tests. Vaccines  Your health care provider may recommend certain vaccines, such as:  Influenza vaccine. This is recommended every year.  Tetanus, diphtheria, and acellular pertussis (Tdap, Td) vaccine. You may need a Td booster every 10 years.  Zoster vaccine. You may need this after age 70.  Pneumococcal 13-valent conjugate (PCV13) vaccine. One dose is recommended after age 75.  Pneumococcal polysaccharide (PPSV23) vaccine. One dose is recommended after age 59. Talk to your health care provider about which screenings and vaccines you need and how often you need them. This information is not intended to replace advice given to you by your health care provider. Make sure you discuss any questions you have with your health care provider. Document Released: 12/08/2015 Document Revised: 07/31/2016 Document Reviewed: 09/12/2015 Elsevier Interactive Patient Education  2017 [place] Prevention in the Home Falls can cause injuries. They can happen to people of all ages. There are many things you can do to make your home safe and to help prevent falls. What can I do on the outside of my home?  Regularly fix the edges of walkways and driveways and fix any cracks.  Remove anything that might make you trip as you walk through a door, such as a raised step or threshold.  Trim any bushes or trees on the path to  your home.  Use bright outdoor lighting.  Clear any walking paths of anything that might make someone trip, such as rocks or tools.  Regularly check to see if handrails are loose or broken. Make sure that both sides of any steps have handrails.  Any raised decks and porches should have guardrails on the edges.  Have any leaves, snow, or ice cleared regularly.  Use sand or salt on walking paths during winter.  Clean up any spills in your garage right away. This includes oil or grease spills. What can I do in the bathroom?  Use night lights.  Install grab bars by the toilet and in the tub and shower. Do not use towel bars as grab bars.  Use non-skid mats or decals in the tub or shower.  If you need to sit down in the shower, use a plastic, non-slip stool.  Keep the floor dry. Clean up any water that spills on the floor as soon as it happens.  Remove soap buildup in the tub or shower regularly.  Attach bath mats securely with double-sided non-slip rug tape.  Do not have throw rugs and other things on the floor that can make you trip. What can I do in the bedroom?  Use night lights.  Make sure that you have a light by your bed that is easy to reach.  Do not use any sheets or blankets that are too big for your bed. They should  not hang down onto the floor.  Have a firm chair that has side arms. You can use this for support while you get dressed.  Do not have throw rugs and other things on the floor that can make you trip. What can I do in the kitchen?  Clean up any spills right away.  Avoid walking on wet floors.  Keep items that you use a lot in easy-to-reach places.  If you need to reach something above you, use a strong step stool that has a grab bar.  Keep electrical cords out of the way.  Do not use floor polish or wax that makes floors slippery. If you must use wax, use non-skid floor wax.  Do not have throw rugs and other things on the floor that can make  you trip. What can I do with my stairs?  Do not leave any items on the stairs.  Make sure that there are handrails on both sides of the stairs and use them. Fix handrails that are broken or loose. Make sure that handrails are as long as the stairways.  Check any carpeting to make sure that it is firmly attached to the stairs. Fix any carpet that is loose or worn.  Avoid having throw rugs at the top or bottom of the stairs. If you do have throw rugs, attach them to the floor with carpet tape.  Make sure that you have a light switch at the top of the stairs and the bottom of the stairs. If you do not have them, ask someone to add them for you. What else can I do to help prevent falls?  Wear shoes that:  Do not have high heels.  Have rubber bottoms.  Are comfortable and fit you well.  Are closed at the toe. Do not wear sandals.  If you use a stepladder:  Make sure that it is fully opened. Do not climb a closed stepladder.  Make sure that both sides of the stepladder are locked into place.  Ask someone to hold it for you, if possible.  Clearly mark and make sure that you can see:  Any grab bars or handrails.  First and last steps.  Where the edge of each step is.  Use tools that help you move around (mobility aids) if they are needed. These include:  Canes.  Walkers.  Scooters.  Crutches.  Turn on the lights when you go into a dark area. Replace any light bulbs as soon as they burn out.  Set up your furniture so you have a clear path. Avoid moving your furniture around.  If any of your floors are uneven, fix them.  If there are any pets around you, be aware of where they are.  Review your medicines with your doctor. Some medicines can make you feel dizzy. This can increase your chance of falling. Ask your doctor what other things that you can do to help prevent falls. This information is not intended to replace advice given to you by your health care provider.  Make sure you discuss any questions you have with your health care provider. Document Released: 09/07/2009 Document Revised: 04/18/2016 Document Reviewed: 12/16/2014 Elsevier Interactive Patient Education  2017 [employer].

## 2020-12-25 NOTE — Progress Notes (Signed)
Subjective:   Todd Barrett. is a 70 y.o. male who presents for an Initial Medicare Annual Wellness Visit.  Review of Systems    No ROS. Medicare Wellness Visit. Additional risk factors are reflected in social history. Cardiac Risk Factors include: advanced age (>21men, >39 women);dyslipidemia;family history of premature cardiovascular disease;hypertension;male gender     Objective:    Today's Vitals   12/25/20 1521  Weight: 227 lb (103 kg)  Height: 5\' 10"  (1.778 m)  PainSc: 0-No pain   Body mass index is 32.57 kg/m.  Advanced Directives 12/25/2020 12/21/2020 09/16/2019 09/08/2019 07/30/2018 07/21/2018 01/08/2018  Does Patient Have a Medical Advance Directive? No No Yes Yes Yes Yes Yes  Type of Advance Directive - 01/10/2018;Living will Healthcare Power of [place];Living will Healthcare Power of [employer];Living will Healthcare Power of [place];Living will Living will  Does patient want to make changes to medical advance directive? - - No - Patient declined No - Patient declined No - Patient declined - No - Patient declined  Copy of Healthcare Power of Attorney in Chart? - - No - copy requested No - copy requested No - copy requested No - copy requested -  Would patient like information on creating a medical advance directive? No - Patient declined No - Patient declined - - - - -    Current Medications (verified) Outpatient Encounter Medications as of 12/25/2020  Medication Sig  . apixaban (ELIQUIS) 5 MG TABS tablet Take 1 tablet (5 mg total) by mouth 2 (two) times daily.  12/27/2020 atorvastatin (LIPITOR) 10 MG tablet Take 1 tablet (10 mg total) by mouth daily.  . fexofenadine (ALLEGRA) 180 MG tablet Take 180 mg by mouth daily.  . fluticasone (FLONASE) 50 MCG/ACT nasal spray Place 2 sprays into the nose daily as needed for allergies. For allergies.  Marland Kitchen lisinopril (ZESTRIL) 10 MG tablet Take 1 tablet (10 mg total) by mouth daily.  . naproxen (NAPROSYN) 500 MG  tablet Take 1 tablet (500 mg total) by mouth 2 (two) times daily with a meal.  . Omega-3 Fatty Acids (FISH OIL) 1200 MG CPDR Take 3,600 mg by mouth daily.   No facility-administered encounter medications on file as of 12/25/2020.    Allergies (verified) Dust mite extract and Other   History: Past Medical History:  Diagnosis Date  . ALLERGIC RHINITIS 01/08/2010  . Allergy    dust mites, pet dander  . ANEMIA-NOS 01/08/2010  . Atrial fibrillation with RVR (HCC) 01/19/2018  . CERVICAL RADICULOPATHY, RIGHT 01/08/2010  . CHEST PAIN 01/08/2010  . DIVERTICULOSIS, COLON 01/08/2010  . Dysrhythmia    post op   during PT a fib RVR  . GERD (gastroesophageal reflux disease)    Zantac OTC  prn  . GOUT 01/08/2010   in past/ when has surgery  . HYPERLIPIDEMIA 01/08/2010  . Lumbar disc disease 08/05/2011  . Osteoarthritis of right knee    end stage  . OSTEOARTHRITIS, KNEES, 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   Past Surgical History:  Procedure Laterality Date  . ANTERIOR CERVICAL DECOMP/DISCECTOMY FUSION  07/23/2012   Procedure: ANTERIOR CERVICAL DECOMPRESSION/DISCECTOMY FUSION 2 LEVELS;  Surgeon: 07/25/2012, MD;  Location: [facility];  Service: Neurosurgery;  Laterality: N/A;  Cervical three-four, four-five, redo Cervical six-seven anterior cervical decompression, discectomy fusion with allograft and plating  . BACK SURGERY  03/2004, 02/2005  . CERVICAL SPINE SURGERY  05/2010  . clavicle facture  nov. 2009   100% displacement  . COLONOSCOPY    .  HERNIA REPAIR     inguinal hernia/right side/ 70 years old  . JOINT REPLACEMENT     Left knee 2006  . KNEE ARTHROSCOPY     total of 5  . left knee arthroscopy  september 2005  . lumbar disc surgury  02/2002  . lumbar disc surgury  june 2004  . NASAL RECONSTRUCTION     x 3  . REVERSE SHOULDER ARTHROPLASTY Left 07/30/2018   Procedure: LEFT REVERSE SHOULDER ARTHROPLASTY;  Surgeon: Francena HanlySupple, Kevin, MD;  Location: [facility] OR;  Service: Orthopedics;  Laterality: Left;  120min   . s/p ENT surgury     x 3  . s/p left shoulder surgury  feb. 2009  . s/p left TKR  april 2006  . TOTAL KNEE ARTHROPLASTY Right 01/16/2018   Procedure: RIGHT TOTAL KNEE ARTHROPLASTY;  Surgeon: Beverely LowNorris, Steve, MD;  [facility];  Service: Orthopedics;  Laterality: Right;  . TOTAL SHOULDER ARTHROPLASTY Right 09/16/2019   Procedure: Right Reverse shoulder arthroplasty;  Surgeon: Francena HanlySupple, Kevin, MD;  Location: [facility];  Service: Orthopedics;  Laterality: Right;   Family History  Problem Relation Age of Onset  . Bradycardia Father 60       Pacemaker  . Lymphoma Mother   . Alzheimer's disease Mother   . Pancreatic cancer Brother   . Colon cancer Neg Hx    Social History   Socioeconomic History  . Marital status: Married    Spouse name: Not on file  . Number of children: Not on file  . Years of education: Not on file  . Highest education level: Not on file  Occupational History  . 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 Smoking status: Never Smoker  . Smokeless tobacco: Never Used  Vaping Use  . Vaping Use: Never used  Substance and Sexual Activity  . Alcohol use: Yes    Alcohol/week: 12.0 standard drinks    Types: 12 Cans of beer per week    Comment: 3-4 days a week  . Drug use: No  . Sexual activity: Yes  Other Topics Concern  . Not on file  Social History Narrative  . Not on file   Social Determinants of Health   Financial Resource Strain: Low Risk   . Difficulty of Paying Living Expenses: Not hard at all  Food Insecurity: No Food Insecurity  . Worried About Programme researcher, broadcasting/film/videounning Out of Food in the Last Year: Never true  . Ran Out of Food in the Last Year: Never true  Transportation Needs: No Transportation Needs  . Lack of Transportation (Medical): No  . Lack of Transportation (Non-Medical): No  Physical Activity: Sufficiently Active  . Days of Exercise per Week: 5 days  . Minutes of Exercise per Session: 30 min  Stress: No Stress Concern  Present  . Feeling of Stress : Not at all  Social Connections: Socially Integrated  . Frequency of Communication with Friends and Family: More than three times a week  . Frequency of Social Gatherings with Friends and Family: More than three times a week  . Attends Religious Services: 1 to 4 times per year  . Active Member of Clubs or Organizations: Yes  . Attends BankerClub or Organization Meetings: 1 to 4 times per year  . Marital Status: Married    Tobacco Counseling Counseling given: Not Answered   Clinical Intake:  Pre-visit preparation completed: Yes  Pain : No/denies pain Pain Score: 0-No pain  BMI - recorded: 32.57 Nutritional Status: BMI > 30  Obese Nutritional Risks: None Diabetes: No  How often do you need to have someone help you when you read instructions, pamphlets, or other written materials from your doctor or pharmacy?: 1 - Never What is the last grade level you completed in school?: Patient has 2 Master Degrees  Diabetic? no  Interpreter Needed?: No  Information entered by :: Susie Cassette, LPN.   Activities of Daily Living In your present state of health, do you have any difficulty performing the following activities: 12/25/2020 12/20/2020  Hearing? N N  Vision? N N  Difficulty concentrating or making decisions? N N  Walking or climbing stairs? Y Y  Dressing or bathing? N N  Doing errands, shopping? N N  Preparing Food and eating ? N -  Using the Toilet? N -  In the past six months, have you accidently leaked urine? N -  Do you have problems with loss of bowel control? N -  Managing your Medications? N -  Managing your Finances? N -  Housekeeping or managing your Housekeeping? N -  Some recent data might be hidden    Patient Care Team: Corwin Levins, MD as PCP - General Corky Crafts, MD as PCP - Cardiology (Cardiology) Sinda Du, MD as Consulting Physician (Ophthalmology)  Indicate any recent Medical Services you may have  received from other than [facility] providers in the past year (date may be approximate).     Assessment:   This is a routine wellness examination for Cloyde.  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 Client understands the importance of follow-up with providers by attending scheduled visits     My goal for 2022 is to not have any more surgeries.      Depression Screen PHQ 2/9 Scores 12/25/2020 12/20/2020 12/21/2019 12/21/2019 12/17/2018 12/16/2017 12/04/2016  PHQ - 2 Score 0 0 0 0 1 0 0  PHQ- 9 Score - - - - - 0 -    Fall Risk Fall Risk  12/25/2020 12/20/2020 12/20/2020 12/21/2019 12/21/2019  Falls in the past year? 1 1 1  0 1  Number falls in past yr: 1 1 1  - 1  Injury with Fall? 0 0 0 - 1  Comment - - - - -  Risk Factor Category  - - - - -  Comment - - - - -  Risk for fall due to : Impaired balance/gait;History of fall(s);Orthopedic patient - - - History of fall(s)  Follow up Falls evaluation completed - - - -    FALL RISK PREVENTION PERTAINING TO THE HOME:  Any stairs in or around the home? Yes  If so, are there any without handrails? No  Home free of loose throw rugs in walkways, pet beds, electrical cords, etc? Yes  Adequate lighting in your home to reduce risk of falls? Yes   ASSISTIVE DEVICES UTILIZED TO PREVENT FALLS:  Life alert? No  Use of a cane, walker or w/c? No  Grab bars in the bathroom? No  Shower chair or bench in shower? Yes  Elevated toilet seat or a handicapped toilet? Yes   TIMED UP AND GO:  Was the test performed? No .  Length of time to ambulate 10 feet: 0 sec.   Gait steady and fast  without use of assistive device  Cognitive Function: Normal cognitive status assessed by direct observation by this Nurse Health Advisor. No abnormalities found.         Immunizations Immunization History  Administered Date(s) Administered  . Fluad Quad(high Dose 65+) 12/21/2019  . Influenza Split 10/05/2012  . Influenza Whole 08/05/2011  . Influenza, High Dose Seasonal PF 12/16/2017, 07/31/2018  . Influenza,inj,Quad PF,6+ Mos 11/24/2013, 11/29/2014, 12/01/2015, 12/04/2016  . Influenza,inj,quad, With Preservative 11/26/2018  . Influenza-Unspecified 12/20/2020  . PFIZER Comirnaty(Gray Top)Covid-19 Tri-Sucrose Vaccine 01/17/2020, 02/14/2020, 08/07/2020  . Pneumococcal Conjugate-13 12/15/2015  . Pneumococcal Polysaccharide-23 12/04/2016  . Td 12/27/2007  . Tdap 12/17/2018    TDAP status: Up to date  Flu Vaccine status: Up to date  Pneumococcal vaccine status: Up to date  Covid-19 vaccine status: Completed vaccines  Qualifies for Shingles Vaccine? Yes   Zostavax completed No   Shingrix Completed?: No.    Education has been provided regarding the importance of this vaccine. Patient has been advised to call insurance company to determine out of pocket expense if they have not yet received this vaccine. Advised may also receive vaccine at local pharmacy or Health Dept. Verbalized acceptance and understanding.  Screening Tests Health Maintenance  Topic Date Due  . OPHTHALMOLOGY EXAM  02/06/2021  . HEMOGLOBIN A1C  06/19/2021  . FOOT EXAM  12/20/2021  . COLONOSCOPY (Pts 45-16yrs Insurance coverage will need to be confirmed)  03/05/2027  . TETANUS/TDAP  12/17/2028  . INFLUENZA VACCINE  Completed  . COVID-19 Vaccine  Completed  . Hepatitis C Screening  Completed  . PNA vac Low Risk Adult  Completed    Health Maintenance  There are no preventive care reminders to display for this patient.  Colorectal cancer screening: Type of screening: Colonoscopy. Completed 03/04/2017.  Repeat every 10 years  Lung Cancer Screening: (Low Dose CT Chest recommended if Age 30-80 years, 30 pack-year currently smoking OR have quit w/in 15years.) does not qualify.   Lung Cancer Screening Referral: no  Additional Screening:  Hepatitis C Screening: does qualify; Completed yes  Vision Screening: Recommended annual ophthalmology exams for early detection of glaucoma and other disorders of the eye. Is the patient up to date with their annual eye exam?  Yes  Who is the provider or what is the name of the office in which the patient attends annual eye exams? Sinda Du, MD If pt is not established with a provider, would they like to be referred to a provider to establish care? No .   Dental Screening: Recommended annual dental exams for proper oral hygiene  Community Resource Referral / Chronic Care Management: CRR required this visit?  No   CCM required this visit?  No      Plan:     I have personally reviewed and noted the following in the patient's chart:   . Medical and social history . Use of alcohol, tobacco or illicit drugs  . Current medications and supplements . Functional ability and status . Nutritional status . Physical activity . Advanced directives . List of other physicians . Hospitalizations, surgeries, and ER visits in previous 12 months . Vitals . Screenings to include cognitive, depression, and falls . Referrals and appointments  In addition, I have reviewed and discussed with patient certain preventive protocols, quality metrics, and best practice recommendations. A written personalized care plan for preventive  services as well as general preventive health recommendations were provided to patient.     Mickeal Needy, LPN   4/33/2951   Nurse Notes:  Patient declined vital signs.

## 2020-12-26 DIAGNOSIS — Z1159 Encounter for screening for other viral diseases: Secondary | ICD-10-CM | POA: Diagnosis not present

## 2021-01-06 ENCOUNTER — Other Ambulatory Visit: Payer: Self-pay

## 2021-01-06 ENCOUNTER — Inpatient Hospital Stay (HOSPITAL_COMMUNITY)
Admission: EM | Admit: 2021-01-06 | Discharge: 2021-01-12 | DRG: 417 | Disposition: A | Discharge status: Home / Self Care | Payer: Medicare Other | Attending: Internal Medicine | Admitting: Internal Medicine

## 2021-01-06 ENCOUNTER — Emergency Department (HOSPITAL_COMMUNITY): Payer: Medicare Other

## 2021-01-06 DIAGNOSIS — K8051 Calculus of bile duct without cholangitis or cholecystitis with obstruction: Secondary | ICD-10-CM | POA: Diagnosis not present

## 2021-01-06 DIAGNOSIS — R0789 Other chest pain: Secondary | ICD-10-CM | POA: Diagnosis not present

## 2021-01-06 DIAGNOSIS — I4891 Unspecified atrial fibrillation: Secondary | ICD-10-CM

## 2021-01-06 DIAGNOSIS — Z820 Family history of epilepsy and other diseases of the nervous system: Secondary | ICD-10-CM | POA: Diagnosis not present

## 2021-01-06 DIAGNOSIS — E559 Vitamin D deficiency, unspecified: Secondary | ICD-10-CM | POA: Diagnosis not present

## 2021-01-06 DIAGNOSIS — F419 Anxiety disorder, unspecified: Secondary | ICD-10-CM | POA: Diagnosis present

## 2021-01-06 DIAGNOSIS — K8050 Calculus of bile duct without cholangitis or cholecystitis without obstruction: Secondary | ICD-10-CM | POA: Diagnosis not present

## 2021-01-06 DIAGNOSIS — D649 Anemia, unspecified: Secondary | ICD-10-CM | POA: Diagnosis present

## 2021-01-06 DIAGNOSIS — I10 Essential (primary) hypertension: Secondary | ICD-10-CM | POA: Diagnosis present

## 2021-01-06 DIAGNOSIS — E785 Hyperlipidemia, unspecified: Secondary | ICD-10-CM | POA: Diagnosis present

## 2021-01-06 DIAGNOSIS — K8689 Other specified diseases of pancreas: Secondary | ICD-10-CM | POA: Diagnosis not present

## 2021-01-06 DIAGNOSIS — R7989 Other specified abnormal findings of blood chemistry: Secondary | ICD-10-CM | POA: Diagnosis present

## 2021-01-06 DIAGNOSIS — Z7901 Long term (current) use of anticoagulants: Secondary | ICD-10-CM

## 2021-01-06 DIAGNOSIS — I712 Thoracic aortic aneurysm, without rupture: Secondary | ICD-10-CM | POA: Diagnosis not present

## 2021-01-06 DIAGNOSIS — K8065 Calculus of gallbladder and bile duct with chronic cholecystitis with obstruction: Principal | ICD-10-CM | POA: Diagnosis present

## 2021-01-06 DIAGNOSIS — K851 Biliary acute pancreatitis without necrosis or infection: Secondary | ICD-10-CM | POA: Diagnosis not present

## 2021-01-06 DIAGNOSIS — K5730 Diverticulosis of large intestine without perforation or abscess without bleeding: Secondary | ICD-10-CM | POA: Diagnosis not present

## 2021-01-06 DIAGNOSIS — F1010 Alcohol abuse, uncomplicated: Secondary | ICD-10-CM | POA: Diagnosis present

## 2021-01-06 DIAGNOSIS — I248 Other forms of acute ischemic heart disease: Secondary | ICD-10-CM | POA: Diagnosis not present

## 2021-01-06 DIAGNOSIS — E782 Mixed hyperlipidemia: Secondary | ICD-10-CM | POA: Diagnosis not present

## 2021-01-06 DIAGNOSIS — G4733 Obstructive sleep apnea (adult) (pediatric): Secondary | ICD-10-CM | POA: Diagnosis present

## 2021-01-06 DIAGNOSIS — Z807 Family history of other malignant neoplasms of lymphoid, hematopoietic and related tissues: Secondary | ICD-10-CM | POA: Diagnosis not present

## 2021-01-06 DIAGNOSIS — I700 Atherosclerosis of aorta: Secondary | ICD-10-CM | POA: Diagnosis present

## 2021-01-06 DIAGNOSIS — Z96612 Presence of left artificial shoulder joint: Secondary | ICD-10-CM | POA: Diagnosis present

## 2021-01-06 DIAGNOSIS — Z800 Family history of malignant neoplasm of digestive organs: Secondary | ICD-10-CM | POA: Diagnosis not present

## 2021-01-06 DIAGNOSIS — R1013 Epigastric pain: Secondary | ICD-10-CM | POA: Diagnosis not present

## 2021-01-06 DIAGNOSIS — K831 Obstruction of bile duct: Secondary | ICD-10-CM | POA: Diagnosis not present

## 2021-01-06 DIAGNOSIS — Z20822 Contact with and (suspected) exposure to covid-19: Secondary | ICD-10-CM | POA: Diagnosis present

## 2021-01-06 DIAGNOSIS — K219 Gastro-esophageal reflux disease without esophagitis: Secondary | ICD-10-CM | POA: Diagnosis present

## 2021-01-06 DIAGNOSIS — K838 Other specified diseases of biliary tract: Secondary | ICD-10-CM | POA: Diagnosis not present

## 2021-01-06 DIAGNOSIS — R079 Chest pain, unspecified: Secondary | ICD-10-CM | POA: Diagnosis not present

## 2021-01-06 DIAGNOSIS — Z96651 Presence of right artificial knee joint: Secondary | ICD-10-CM | POA: Diagnosis present

## 2021-01-06 DIAGNOSIS — M170 Bilateral primary osteoarthritis of knee: Secondary | ICD-10-CM | POA: Diagnosis present

## 2021-01-06 DIAGNOSIS — I200 Unstable angina: Secondary | ICD-10-CM | POA: Diagnosis present

## 2021-01-06 DIAGNOSIS — I4819 Other persistent atrial fibrillation: Secondary | ICD-10-CM | POA: Diagnosis not present

## 2021-01-06 DIAGNOSIS — M109 Gout, unspecified: Secondary | ICD-10-CM | POA: Diagnosis present

## 2021-01-06 DIAGNOSIS — K8020 Calculus of gallbladder without cholecystitis without obstruction: Secondary | ICD-10-CM | POA: Diagnosis not present

## 2021-01-06 DIAGNOSIS — R109 Unspecified abdominal pain: Secondary | ICD-10-CM | POA: Diagnosis not present

## 2021-01-06 DIAGNOSIS — K8010 Calculus of gallbladder with chronic cholecystitis without obstruction: Secondary | ICD-10-CM | POA: Diagnosis not present

## 2021-01-06 DIAGNOSIS — I480 Paroxysmal atrial fibrillation: Secondary | ICD-10-CM | POA: Diagnosis not present

## 2021-01-06 DIAGNOSIS — R0602 Shortness of breath: Secondary | ICD-10-CM | POA: Diagnosis not present

## 2021-01-06 LAB — RESP PANEL BY RT-PCR (FLU A&B, COVID) ARPGX2
Influenza A by PCR: NEGATIVE
Influenza B by PCR: NEGATIVE
SARS Coronavirus 2 by RT PCR: NEGATIVE

## 2021-01-06 LAB — BASIC METABOLIC PANEL
Anion gap: 13 (ref 5–15)
BUN: 23 mg/dL (ref 8–23)
CO2: 22 mmol/L (ref 22–32)
Calcium: 9.1 mg/dL (ref 8.9–10.3)
Chloride: 104 mmol/L (ref 98–111)
Creatinine, Ser: 1.1 mg/dL (ref 0.61–1.24)
GFR, Estimated: >60 mL/min (ref >60)
Glucose, Bld: 164 mg/dL — ABNORMAL HIGH (ref 70–99)
Potassium: 3.9 mmol/L (ref 3.5–5.1)
Sodium: 139 mmol/L (ref 135–145)

## 2021-01-06 LAB — CBC
HCT: 42.3 % (ref 39.0–52.0)
Hemoglobin: 14.1 g/dL (ref 13.0–17.0)
MCH: 30.1 pg (ref 26.0–34.0)
MCHC: 33.3 g/dL (ref 30.0–36.0)
MCV: 90.2 fL (ref 80.0–100.0)
Platelets: 164 10*3/uL (ref 150–400)
RBC: 4.69 MIL/uL (ref 4.22–5.81)
RDW: 12.5 % (ref 11.5–15.5)
WBC: 8.9 10*3/uL (ref 4.0–10.5)
nRBC: 0 % (ref 0.0–0.2)

## 2021-01-06 LAB — TSH: TSH: 2.319 u[IU]/mL (ref 0.350–4.500)

## 2021-01-06 LAB — D-DIMER, QUANTITATIVE: D-Dimer, Quant: 0.83 ug/mL-FEU — ABNORMAL HIGH (ref 0.00–0.50)

## 2021-01-06 LAB — TROPONIN I (HIGH SENSITIVITY)
Troponin I (High Sensitivity): 15 ng/L (ref <18)
Troponin I (High Sensitivity): 13 ng/L (ref <18)

## 2021-01-06 LAB — MAGNESIUM: Magnesium: 2.2 mg/dL (ref 1.7–2.4)

## 2021-01-06 MED ORDER — ASPIRIN EC 81 MG PO TBEC
81.0000 mg | DELAYED_RELEASE_TABLET | Freq: Every day | ORAL | Status: DC
  Administered 2021-01-07 – 2021-01-12 (×5): 81 mg via ORAL
  Filled 2021-01-06 (×5): qty 1

## 2021-01-06 MED ORDER — LISINOPRIL 10 MG PO TABS
10.0000 mg | ORAL_TABLET | Freq: Every day | ORAL | Status: DC
  Administered 2021-01-07 – 2021-01-12 (×5): 10 mg via ORAL
  Filled 2021-01-06 (×5): qty 1

## 2021-01-06 MED ORDER — ONDANSETRON HCL 4 MG/2ML IJ SOLN
4.0000 mg | Freq: Four times a day (QID) | INTRAMUSCULAR | Status: DC | PRN
  Administered 2021-01-07: 4 mg via INTRAVENOUS
  Filled 2021-01-06: qty 2

## 2021-01-06 MED ORDER — NITROGLYCERIN 0.4 MG SL SUBL
0.4000 mg | SUBLINGUAL_TABLET | SUBLINGUAL | Status: DC | PRN
  Administered 2021-01-07: 10:00:00 0.4 mg via SUBLINGUAL
  Filled 2021-01-06: qty 1

## 2021-01-06 MED ORDER — HEPARIN (PORCINE) 25000 UT/250ML-% IV SOLN
1750.0000 [IU]/h | INTRAVENOUS | Status: DC
  Administered 2021-01-06: 21:00:00 1150 [IU]/h via INTRAVENOUS
  Administered 2021-01-07: 15:00:00 1300 [IU]/h via INTRAVENOUS
  Administered 2021-01-08: 1500 [IU]/h via INTRAVENOUS
  Administered 2021-01-08: 1750 [IU]/h via INTRAVENOUS
  Filled 2021-01-06 (×6): qty 250

## 2021-01-06 MED ORDER — ASPIRIN 81 MG PO CHEW
324.0000 mg | CHEWABLE_TABLET | Freq: Once | ORAL | Status: AC
  Administered 2021-01-06: 324 mg via ORAL
  Filled 2021-01-06: qty 4

## 2021-01-06 MED ORDER — ACETAMINOPHEN 325 MG PO TABS: 650.0000 mg | ORAL_TABLET | ORAL | Status: DC | PRN

## 2021-01-06 MED ORDER — IOHEXOL 350 MG/ML SOLN
65.0000 mL | Freq: Once | INTRAVENOUS | Status: AC | PRN
  Administered 2021-01-06: 65 mL via INTRAVENOUS

## 2021-01-06 MED ORDER — ATORVASTATIN CALCIUM 10 MG PO TABS
10.0000 mg | ORAL_TABLET | Freq: Every day | ORAL | Status: DC
  Administered 2021-01-07 – 2021-01-12 (×5): 10 mg via ORAL
  Filled 2021-01-06 (×5): qty 1

## 2021-01-06 NOTE — ED Provider Notes (Signed)
This patient was received a change of shift,  This patient is a 70 year old male, he presents to the hospital after having several weeks of symptoms including an epigastric discomfort which sometimes radiate up into his chest, it does not feel like his prior acid reflux and is often associated with feeling of shortness of breath especially when he exerts himself.  He notes that he recently took a vacation or trip this last week down to the [place] at which time he was feeling like he could not walk very far before he was getting sweaty and short of breath and having discomfort in his chest.  He has never had anything like this before.  He was recently placed on Eliquis for atrial fibrillation, he does not have any swelling in his legs, he is having some active chest discomfort which she says is a little bit intermittent throughout the day but definitely worse with exertion.  He has not had a stress test or heart catheterization in many years, his cardiologist is Dr. Eldridge Dace.  CT scan of the chest was negative,  EKG shows atrial fibrillation without signs of STEMI  Troponin will be added EKG repeated and cardiology consulted as I am concerned this is unstable angina  Discussed case with the cardiology fellow - Susy Frizzle - who agrees to admit to cardiology service assuming that he will need a provocative test.  Asking to hold eliquis - heparin based on last dose of eliquis, no asa.    Pt stable appearing, mild pain - repeat ekg without STEMI.   EKG Interpretation  Date/Time:  Saturday January 06 2021 17:35:51 EST Ventricular Rate:  74 PR Interval:    QRS Duration: 92 QT Interval:  379 QTC Calculation: 421 R Axis:   26 Text Interpretation: Atrial fibrillation rate controlled, compared wtih prior, no sig changes, mild ST abnormalities precordial leads Confirmed by Eber Hong ([id number]) on 01/06/2021 6:03:18 PM      .Critical Care Performed by: Eber Hong, MD Authorized by: Eber Hong, MD   Critical care provider statement:    Critical care time (minutes):  35   Critical care time was exclusive of:  Separately billable procedures and treating other patients and teaching time   Critical care was necessary to treat or prevent imminent or life-threatening deterioration of the following conditions:  Cardiac failure   Critical care was time spent personally by me on the following activities:  Blood draw for specimens, development of treatment plan with patient or surrogate, discussions with consultants, evaluation of patient's response to treatment, examination of patient, obtaining history from patient or surrogate, ordering and performing treatments and interventions, ordering and review of laboratory studies, ordering and review of radiographic studies, pulse oximetry, re-evaluation of patient's condition and review of old charts    Final diagnoses:  Atrial fibrillation, unspecified type (HCC)  Unstable angina (HCC)      Eber Hong, MD 01/06/21 [phone]

## 2021-01-06 NOTE — ED Notes (Signed)
Patient transported to CT 

## 2021-01-06 NOTE — Progress Notes (Signed)
ANTICOAGULATION CONSULT NOTE - Initial Consult  Pharmacy Consult for heparin Indication: chest pain/ACS  Allergies  Allergen Reactions  . Dust Mite Extract Other (See Comments)    Nasal Congestion  . Drug Ingredient [Bee Pollen] Other (See Comments)    Congestion  . Other Other (See Comments)    Pet Dander- Runny nose    Patient Measurements: Height: 5\' 10"  (177.8 cm) Weight: 102.1 kg (225 lb) IBW/kg (Calculated) : 73 Heparin Dosing Weight: 94.5 kg  Vital Signs: Temp: 98 F (36.7 C) (02/12 1247) Temp Source: Oral (02/12 1247) BP: 127/81 (02/12 1845) Pulse Rate: 78 (02/12 1845)  Labs: Recent Labs    01/06/21 1302 01/06/21 1737  HGB 14.1  --   HCT 42.3  --   PLT 164  --   CREATININE 1.10  --   TROPONINIHS  --  15    Estimated Creatinine Clearance: 74.8 mL/min (by C-G formula based on SCr of 1.1 mg/dL).   Medical History: Past Medical History:  Diagnosis Date  . ALLERGIC RHINITIS 01/08/2010  . Allergy    dust mites, pet dander  . ANEMIA-NOS 01/08/2010  . Atrial fibrillation with RVR (HCC) 01/19/2018  . CERVICAL RADICULOPATHY, RIGHT 01/08/2010  . CHEST PAIN 01/08/2010  . DIVERTICULOSIS, COLON 01/08/2010  . Dysrhythmia    post op   during PT a fib RVR  . GERD (gastroesophageal reflux disease)    Zantac OTC  prn  . GOUT 01/08/2010   in past/ when has surgery  . HYPERLIPIDEMIA 01/08/2010  . Lumbar disc disease 08/05/2011  . Osteoarthritis of right knee    end stage  . OSTEOARTHRITIS, KNEES, 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    Medications:  See MAR  Assessment: 69 yom with several weeks of epigastric discomfort; has been getting diaphoretic/SOB with minimal walking. On apixaban for hx Afib (LD 2/12@0700 ).  Hgb 14.1, plt 164. Trop 15. D-dimer 0.83. No s/sx of bleeding.   Given recent DOAC, will monitor both aPTT/HL until they correlate.  Goal of Therapy:  Heparin level 0.3-0.7 units/ml aPTT 66-102 seconds Monitor platelets by anticoagulation protocol: Yes   Plan:   Will start heparin infusion at 1150 units/hr Get 8 hr HL/aPTT Monitor daily CBC, for s/sx of bleeding, and HL/aPTT  01/10/2010, PharmD, BCCCP Clinical Pharmacist  Phone: [phone] 01/06/2021 8:01 PM  Please check AMION for all [facility] phone numbers After 10:00 PM, call Main Pharmacy [phone]

## 2021-01-06 NOTE — ED Triage Notes (Signed)
Pt from home for eval of central chest pain onset 1100 today while taking care of his dogs. Pt given 324 ASA and 1 nitro with total relief of pain. Recent workups for possible afib and is on Eliquis. Endorses slight shortness of breath. Just returned from vacation in [place]. [place] yesterday.

## 2021-01-06 NOTE — H&P (Addendum)
Cardiology Admission History and Physical:   Patient ID: Todd Barrett. MRN: [id number]; DOB: 1951-02-23   Admission date: 01/06/2021  Primary Care Provider: Corwin Levins, MD [facility] Cardiologist: Lance Muss, MD  [facility] Electrophysiologist:  None   Chief Complaint: chest pain   Patient Profile:   Todd Barrett is a 70M with pAF, HLD, HTN, and OA who presents with unstable angina.   History of Present Illness:   Todd Barrett just returned from vacation in [place]. Todd Barrett with his Barrett and had several episodes of chest discomfort and associated shortness of breath that were concerning to help.  He and his Barrett flew down to [place] last Friday and he had no issues for the Friday until Tuesday.  He went to bed Wednesday night and had no symptoms however woke from sleep early Thursday morning around 1 AM with moderate substernal chest pain which she attributed initially to indigestion for which meals on Wednesday.  He stayed up for around 2-2.5 hours and took some Zantac with eventual resolution of his chest discomfort.  The pain was dull and constant with no radiation and only 3-4/10 in severity.  Thursday he generally felt back to his normal self however Thursday night had a similar experience where he awoke from sleep from 1-1.5 hours with exactly the same symptoms as the prior morning.  This time he took both Zantac and Mylanta with little immediate improvement but eventually his pain did resolve.  He and his Barrett flew back to [place] for connecting flight to [facility] Friday night.  While rushing through the airport to catch a connecting flight he noticed that he had significant diaphoresis and is short was soaking wet just walking across the airport.  He did not have chest pain during this but did experience shortness of breath which he felt was out of portion to his activity level.    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  Unfortunately this is led to a great deal of stress for Todd Barrett as he is the main person in charge of the facility and management of their business.  He has had multiple orthopedic surgeries which she attributes to overdoing it with physical exertion related business.  When he returned from his vacation he said Saturday morning he felt what he perceived as a great deal of anxiety related to being back and having to deal with the day to day problems related to managing a large scale business for so many years.  He feels like he is always worried that everyone relies on for fixing of a problem related to the business.  He does feel that he has been somewhat more sedentary over the past year although some of this he attributes to the cyclical nature of their company.  Initially he did not think much at this point he compares his functional abilities this year compared to last year he feels like he is significantly more short winded with day-to-day activities.  He initially attributed this just to his age however he says he was very active up until a year ago.  Now for him to walk 100 yards he will have harder time breathing.  He denies any orthopnea, PND, lower extremity edema, or weight gain.  On Saturday morning after he took his dogs for the  ride in the car he returned to the family farm and his Barrett came to have a several issues she wanted his help with at which point he started to develop gradual onset substernal chest pain which was initially Sital but progressively worsened over the following hour.  Initially thought that he was just having an anxiety attack related to being back at work but his pain progressed over the following 15 minutes from a 3/10 severity to 10/10 which he had never experienced before.  The pain was fairly intense and was center of his chest which was  different from prior episodes where it was under his rib cage and more epigastric.  The pain was continuous, intense, grip like and nonradiating.  He stayed at a 10/10 severity until EMS arrival where he received aspirin and sublingual nitroglycerin which seemed to provide significant symptom relief.  His pain was subsiding prior to EMS transport and on arrival to the ED was significantly reduced.  Of note he was diagnosed with pAF in 2019 following total knee replacement at which time he was started on Cardizem and Xarelto in 2019.  Echo at that time showed a normal EF he was scheduled for outpatient cardioversion however spontaneously cardioverted the interim.  He was later taken off of both Xarelto and Cardizem when he had no recurrence of AF it was presumed to be only in the postop period.  He later had an outpatient Holter monitor which did not reveal recurrence of atrial fibrillation. Two weeks ago (12/21/20) he was evaluated at [facility] ED for history of pain that was new in onset with no prior similar episodes.  He was found to be in recurrent atrial fibrillation and given C2V (2) was started on Eliquis with cardiology follow-up and discharged.  Troponins were checked at that time normal (8->7).  His recent episodes this past Thursday were similar in character but less severe than his episode 2 weeks prior.  Past Medical History:  Diagnosis Date  . ALLERGIC RHINITIS 01/08/2010  . Allergy    dust mites, pet dander  . ANEMIA-NOS 01/08/2010  . Atrial fibrillation with RVR (HCC) 01/19/2018  . CERVICAL RADICULOPATHY, RIGHT 01/08/2010  . CHEST PAIN 01/08/2010  . DIVERTICULOSIS, COLON 01/08/2010  . Dysrhythmia    post op   during PT a fib RVR  . GERD (gastroesophageal reflux disease)    Zantac OTC  prn  . GOUT 01/08/2010   in past/ when has surgery  . HYPERLIPIDEMIA 01/08/2010  . Lumbar disc disease 08/05/2011  . Osteoarthritis of right knee    end stage  . OSTEOARTHRITIS, KNEES, BILATERAL  01/08/2010   Past Surgical History:  Procedure Laterality Date  . ANTERIOR CERVICAL DECOMP/DISCECTOMY FUSION  07/23/2012   Procedure: ANTERIOR CERVICAL DECOMPRESSION/DISCECTOMY FUSION 2 LEVELS;  Surgeon: Clydene Fake, MD;  Location: [facility];  Service: Neurosurgery;  Laterality: N/A;  Cervical three-four, four-five, redo Cervical six-seven anterior cervical decompression, discectomy fusion with allograft and plating  . BACK SURGERY  03/2004, 02/2005  . CERVICAL SPINE SURGERY  05/2010  . clavicle facture  nov. 2009   100% displacement  . COLONOSCOPY    . HERNIA REPAIR     inguinal hernia/right side/ 70 years old  . JOINT REPLACEMENT     Left knee 2006  . KNEE ARTHROSCOPY     total of 5  . left knee arthroscopy  september 2005  . lumbar disc surgury  02/2002  . lumbar disc surgury  june 2004  .  NASAL RECONSTRUCTION     x 3  . REVERSE SHOULDER ARTHROPLASTY Left 07/30/2018   Procedure: LEFT REVERSE SHOULDER ARTHROPLASTY;  Surgeon: Francena HanlySupple, Kevin, MD;  Location: [facility] OR;  Service: Orthopedics;  Laterality: Left;  120min  . s/p ENT surgury     x 3  . s/p left shoulder surgury  feb. 2009  . s/p left TKR  april 2006  . TOTAL KNEE ARTHROPLASTY Right 01/16/2018   Procedure: RIGHT TOTAL KNEE ARTHROPLASTY;  Surgeon: Beverely LowNorris, Steve, MD;  [facility];  Service: Orthopedics;  Laterality: Right;  . TOTAL SHOULDER ARTHROPLASTY Right 09/16/2019   Procedure: Right Reverse shoulder arthroplasty;  Surgeon: Francena HanlySupple, Kevin, MD;  Location: [facility] ORS;  Service: Orthopedics;  Laterality: Right;    Medications Prior to Admission: Prior to Admission medications   Medication Sig Start Date End Date Taking? Authorizing Provider  amoxicillin (AMOXIL) 500 MG capsule Take 500 mg by mouth once as needed (pre-dental appointment/surgery). 12/14/20  Yes [provider]  apixaban (ELIQUIS) 5 MG TABS tablet Take 1 tablet (5 mg total) by mouth 2 (two) times daily. 12/21/20 01/20/21 Yes Benjiman CorePickering, Nathan, MD   atorvastatin (LIPITOR) 10 MG tablet Take 1 tablet (10 mg total) by mouth daily. 12/20/20  Yes Corwin LevinsJohn, [name], MD  Calcium Polycarbophil (FIBER-CAPS PO) Take 2 tablets by mouth daily.   Yes [provider]  fexofenadine (ALLEGRA) 180 MG tablet Take 180 mg by mouth in the morning and at bedtime.   Yes [provider]  fluticasone (FLONASE) 50 MCG/ACT nasal spray Place 2 sprays into the nose daily as needed for allergies. For allergies.   Yes [provider]  lisinopril (ZESTRIL) 10 MG tablet Take 1 tablet (10 mg total) by mouth daily. 12/20/20  Yes Corwin LevinsJohn, [name], MD  naproxen (NAPROSYN) 500 MG tablet Take 1 tablet (500 mg total) by mouth 2 (two) times daily with a meal. 09/17/19  Yes Shuford, French Anaracy, PA-C  Omega-3 Fatty Acids (FISH OIL) 1200 MG CPDR Take 3,600 mg by mouth daily.   Yes [provider]  penicillin v potassium (VEETID) 500 MG tablet Take 500 mg by mouth once as needed (pre-dental appointment/surgery). 12/18/20  Yes [provider]  VITAMIN D PO Take 2 tablets by mouth daily.   Yes [provider]    Allergies:    Allergies  Allergen Reactions  . Dust Mite Extract Other (See Comments)    Nasal Congestion  . Drug Ingredient [Bee Pollen] Other (See Comments)    Congestion  . Other Other (See Comments)    Pet Dander- Runny nose   Social History:   Social History   Socioeconomic History  . Marital status: Married    Spouse name: Not on file  . Number of children: Not on file  . Years of education: Not on file  . Highest education level: Not on file  Occupational History  . 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 Smoking status: Never Smoker  . Smokeless tobacco: Never Used  Vaping Use  . Vaping Use: Never used  Substance and Sexual Activity  . Alcohol use: Yes    Alcohol/week: 12.0 standard drinks    Types: 12 Cans of beer per week    Comment: 3-4 days a week  . Drug use: No  .  Sexual activity: Yes  Other Topics Concern  . Not on file  Social History Narrative  . Not on file   Social  Determinants of Health   Financial Resource Strain: Low Risk   . Difficulty of Paying Living Expenses: Not hard at all  Food Insecurity: No Food Insecurity  . Worried About Programme researcher, broadcasting/film/video in the Last Year: Never true  . Ran Out of Food in the Last Year: Never true  Transportation Needs: No Transportation Needs  . Lack of Transportation (Medical): No  . Lack of Transportation (Non-Medical): No  Physical Activity: Sufficiently Active  . Days of Exercise per Week: 5 days  . Minutes of Exercise per Session: 30 min  Stress: No Stress Concern Present  . Feeling of Stress : Not at all  Social Connections: Socially Integrated  . Frequency of Communication with Friends and Family: More than three times a week  . Frequency of Social Gatherings with Friends and Family: More than three times a week  . Attends Religious Services: 1 to 4 times per year  . Active Member of Clubs or Organizations: Yes  . Attends Banker Meetings: 1 to 4 times per year  . Marital Status: Married  Catering manager Violence: Not on file    Family History:   The patient's family history includes Alzheimer's disease in his mother; Bradycardia (age of onset: 66) in his father; Lymphoma in his mother; Pancreatic cancer in his brother. There is no history of Colon cancer.    ROS:   Review of Systems: [y] = yes,  = no       General: Weight gain ; Weight loss ; Anorexia ; Fatigue ; Fever ; Chills ; Weakness     Cardiac: Chest pain/pressure [y]; Resting SOB ; Exertional SOB [y]; Orthopnea ; Pedal Edema ; Palpitations ; Syncope ; Presyncope ; Paroxysmal nocturnal dyspnea     Pulmonary: Cough ; Wheezing ; Hemoptysis ; Sputum ; Snoring     GI: Vomiting ; Dysphagia ; Melena ; Hematochezia ; Heartburn ; Abdominal pain ;  Constipation ; Diarrhea ; BRBPR     GU: Hematuria ; Dysuria ; Nocturia   Vascular: Pain in legs with walking ; Pain in feet with lying flat ; Non-healing sores ; Stroke ; TIA ; Slurred speech ;    Neuro: Headaches ; Vertigo ; Seizures ; Paresthesias ;Blurred vision ; Diplopia ; Vision changes     Ortho/Skin: Arthritis ; Joint pain ; Muscle pain ; Joint swelling ; Back Pain ; Rash     Psych: Depression ; Anxiety     Heme: Bleeding problems ; Clotting disorders ; Anemia     Endocrine: Diabetes ; Thyroid dysfunction    Physical Exam/Data:   Vitals:   01/06/21 1545 01/06/21 1600 01/06/21 1721 01/06/21 1745  BP: 127/87 132/86 (!) 142/85 131/86  Pulse: 87 76 71 76  Resp: 16 18 (!) 24 16  Temp:      TempSrc:      SpO2: 97% 97% 97% 98%  Weight:      Height:       No intake or output data in the 24 hours ending 01/06/21 1759 Last 3 Weights 01/06/2021 12/25/2020 12/21/2020  Weight (lbs) 225 lb 227 lb 227 lb  Weight (kg) 102.059 kg  102.967 kg 102.967 kg     Body mass index is 32.28 kg/m.  General:  Well nourished, well developed, in no acute distress HEENT: normal Lymph: no adenopathy Neck: no JVD Endocrine:  No thryomegaly Vascular: No carotid bruits; FA pulses 2+ bilaterally without bruits  Cardiac:  normal S1, S2; RRR; no murmur  Lungs:  clear to auscultation bilaterally, no wheezing, rhonchi or rales  Abd: soft, nontender, no hepatomegaly  Ext: no LE edema Musculoskeletal:  No deformities, BUE and BLE strength normal and equal Skin: warm and dry  Neuro:  CNs 2-12 intact, no focal abnormalities noted Psych:  Normal affect   EKG:  The ECG that was done and showed rate controlled AF   Relevant CV Studies:  CTPE Result date: 01/06/21 1. No CT findings for pulmonary embolism. 2. 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  Recommend semi-annual imaging followup by CTA or MRA and referral to cardiothoracic surgery if not already obtained. 2010; 121: [id number]. 3. No acute pulmonary findings. 4. Aortic atherosclerosis.  TTE Result date: 01/29/18 - Left ventricle: The cavity size was normal. There was mild  concentric hypertrophy. Systolic function was normal. The  estimated ejection fraction was in the range of 55% to 60%. Wall  motion was normal; there were no regional wall motion  abnormalities. Doppler parameters are consistent with abnormal  left ventricular relaxation (grade 1 diastolic dysfunction).  There was no evidence of elevated ventricular filling pressure by  Doppler parameters.  - Aortic valve: Trileaflet; mildly thickened, mildly calcified  leaflets. Transvalvular velocity was within the normal range.  There was no stenosis. There was mild regurgitation.  - Mitral valve: There was trivial regurgitation.  - Left atrium: The atrium was moderately dilated.  - Right ventricle: The cavity size was normal. Wall thickness was  normal. Systolic function was normal.  - Right atrium: The atrium was normal in size.  - Tricuspid valve: There was mild regurgitation.  - Pulmonary arteries: Systolic pressure was within the normal  range.  - Inferior vena cava: The vessel was normal in size.  - Pericardium, extracardiac: There was no pericardial effusion.   Laboratory Data:  High Sensitivity Troponin:   Recent Labs  Lab 12/21/20 1003 12/21/20 1230  TROPONINIHS 8 7      Chemistry Recent Labs  Lab 01/06/21 1302  NA 139  K 3.9  CL 104  CO2 22  GLUCOSE 164*  BUN 23  CREATININE 1.10  CALCIUM 9.1  GFRNONAA >60  ANIONGAP 13    No results for input(s): PROT, ALBUMIN, AST, ALT, ALKPHOS, BILITOT in the last 168 hours. Hematology Recent Labs  Lab 01/06/21 1302  WBC 8.9  RBC 4.69  HGB 14.1  HCT 42.3  MCV 90.2  MCH 30.1  MCHC 33.3  RDW 12.5  PLT 164   BNPNo  results for input(s): BNP, PROBNP in the last 168 hours.  DDimer  Recent Labs  Lab 01/06/21 1302  DDIMER 0.83*    Radiology/Studies:  CT Angio Chest PE W and/or Wo Contrast  Result Date: 01/06/2021 CLINICAL DATA:  Chest pain and shortness of breath today. EXAM: CT ANGIOGRAPHY CHEST WITH CONTRAST TECHNIQUE: Multidetector CT imaging of the chest was performed using the standard protocol during bolus administration of intravenous contrast. Multiplanar CT image reconstructions and MIPs were obtained to evaluate the vascular anatomy. CONTRAST:  77mL OMNIPAQUE IOHEXOL 350 MG/ML SOLN COMPARISON:  None. FINDINGS: Cardiovascular: The heart is within normal limits in size.  No pericardial effusion. There is fusiform aneurysmal dilatation of the ascending thoracic aorta with maximum measurement of 4.4 cm. Scattered calcifications at the aortic arch. Scattered coronary artery calcifications. The pulmonary arterial tree is well opacified. No filling defects to suggest pulmonary embolism. Mediastinum/Nodes: No mediastinal or hilar mass or adenopathy. The esophagus is unremarkable. Lungs/Pleura: No acute pulmonary findings. No infiltrates or effusions. No pulmonary edema. No worrisome pulmonary lesions. Mild dependent atelectasis is noted. Upper Abdomen: No significant upper abdominal findings. Small scattered hepatic cysts are noted. Musculoskeletal: No chest wall mass, supraclavicular or axillary adenopathy. The bony thorax is intact.  No worrisome bone lesions. Review of the MIP images confirms the above findings. IMPRESSION: 1. No CT findings for pulmonary embolism. 2. 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 Recommend semi-annual imaging followup by CTA or MRA and referral to cardiothoracic surgery if not already obtained. 2010; 121: [id number]. 3. No acute pulmonary findings. 4. Aortic atherosclerosis. Aortic Atherosclerosis (ICD10-I70.0). Electronically  Signed   By: Rudie Meyer M.D.   On: 01/06/2021 16:34   DG Chest Port 1 View  Result Date: 01/06/2021 CLINICAL DATA:  Shortness of breath EXAM: PORTABLE CHEST 1 VIEW COMPARISON:  12/21/2020 FINDINGS: The heart size and mediastinal contours are within normal limits. Both lungs are clear. Status post bilateral reverse shoulder arthroplasty. IMPRESSION: No acute abnormality of the lungs in AP portable projection. Electronically Signed   By: Lauralyn Primes M.D.   On: 01/06/2021 14:04     TIMI Risk Score for Unstable Angina or Non-ST Elevation MI:   The patient's TIMI risk score is 3, which indicates a 13% risk of all cause mortality, new or recurrent myocardial infarction or need for urgent revascularization in the next 14 days.{  Assessment and Plan:   Todd Barrett is a 73M with pAF, HLD, HTN, and OA who presents with unstable angina.  His symptoms are concerning for cardiac etiology given with associated chest pressure, shortness of breath, and diaphoresis with exertion.  His main risk factors are HLD and HTN.  He does not have significant family history of premature CAD or sudden cardiac death.  Stress seems to be a significant component of his more recent escalation of symptoms in severity.  Although he was recently found to have pAF there is no evidence of AF/RVR that would explain his symptoms during this presentation or 2 weeks ago.  I think would be reasonable to obtain a coronary CTA to evaluate for obstructive CAD and possible coronary angiography if CTA is consistent with obstructive CAD.  ECG and biomarkers are unremarkable.  Transitioned from his Eliquis to heparin in case he needs coronary angiography.  Otherwise tentatively plan to do additional imaging to determine whether he needs to stay over the weekend for additional evaluation.  Severity of Illness: The appropriate patient status for this patient is INPATIENT. Inpatient status is judged to be reasonable and necessary in order to provide  the required intensity of service to ensure the patient's safety. The patient's presenting symptoms, physical exam findings, and initial radiographic and laboratory data in the context of their chronic comorbidities is felt to place them at high risk for further clinical deterioration. Furthermore, it is not anticipated that the patient will be medically stable for discharge from the hospital within 2 midnights of admission. The following factors support the patient status of inpatient.   " The patient's presenting symptoms include unstable angina. " The worrisome physical exam findings include: chest pain  "  The initial radiographic and laboratory data are worrisome because of: n/a " The chronic co-morbidities include: HTN/HLD  * I certify that at the point of admission it is my clinical judgment that the patient will require inpatient hospital care spanning beyond 2 midnights from the point of admission due to high intensity of service, high risk for further deterioration and high frequency of surveillance required.*   For questions or updates, please contact [facility] Please consult [email or web address] for contact info under   Signed, Linton Rump, MD  01/06/2021 5:59 PM

## 2021-01-06 NOTE — ED Notes (Signed)
RN messaged pharmacy for due heparin infusion

## 2021-01-06 NOTE — ED Provider Notes (Signed)
[facility] EMERGENCY DEPARTMENT Provider Note   CSN: [id number] Arrival date & time: 01/06/21  1241     History Chief Complaint  Patient presents with  . Chest Pain    Todd Barrett. is a 70 y.o. male.  Pt presents to the ED today with sob and cp.  Pt said sx started today at 1100.  The pt has recently been diagnosed with afib and was put on Eliquis on 1/27.  Pt has also just returned from vacation yesterday.  The pt said he was sob yesterday before the flight.  No f/c.  Pt was given 324 mg asa and 1 nitro en route.  No more cp.        Past Medical History:  Diagnosis Date  . ALLERGIC RHINITIS 01/08/2010  . Allergy    dust mites, pet dander  . ANEMIA-NOS 01/08/2010  . Atrial fibrillation with RVR (HCC) 01/19/2018  . CERVICAL RADICULOPATHY, RIGHT 01/08/2010  . CHEST PAIN 01/08/2010  . DIVERTICULOSIS, COLON 01/08/2010  . Dysrhythmia    post op   during PT a fib RVR  . GERD (gastroesophageal reflux disease)    Zantac OTC  prn  . GOUT 01/08/2010   in past/ when has surgery  . HYPERLIPIDEMIA 01/08/2010  . Lumbar disc disease 08/05/2011  . Osteoarthritis of right knee    end stage  . OSTEOARTHRITIS, KNEES, 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    Patient Active Problem List   Diagnosis Date Noted  . Vitamin D deficiency 12/20/2020  . Hypertension 03/28/2020  . 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 S/p reverse total shoulder arthroplasty 07/30/2018  . Atrial fibrillation with RVR (HCC) 01/19/2018  . Status post total knee replacement, right 01/16/2018  . Osteoarthritis of right knee 12/15/2017  . Hyperglycemia 12/04/2016  . Lumbar disc disease 08/05/2011  . Cervical disc disease 08/05/2011  . Preventative health care 08/02/2011  . Hyperlipemia 01/08/2010  . GOUT 01/08/2010  . Anemia 01/08/2010  . Anxiety state 01/08/2010  . Allergic rhinitis 01/08/2010  . DIVERTICULOSIS, COLON 01/08/2010  . OSTEOARTHRITIS, KNEES, 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   . CERVICAL RADICULOPATHY, RIGHT 01/08/2010  . CHEST PAIN 01/08/2010    Past Surgical History:  Procedure Laterality Date  . ANTERIOR CERVICAL DECOMP/DISCECTOMY FUSION  07/23/2012   Procedure: ANTERIOR CERVICAL DECOMPRESSION/DISCECTOMY FUSION 2 LEVELS;  Surgeon: Clydene Fake, MD;  Location: [facility];  Service: Neurosurgery;  Laterality: N/A;  Cervical three-four, four-five, redo Cervical six-seven anterior cervical decompression, discectomy fusion with allograft and plating  . BACK SURGERY  03/2004, 02/2005  . CERVICAL SPINE SURGERY  05/2010  . clavicle facture  nov. 2009   100% displacement  . COLONOSCOPY    . HERNIA REPAIR     inguinal hernia/right side/ 70 years old  . JOINT REPLACEMENT     Left knee 2006  . KNEE ARTHROSCOPY     total of 5  . left knee arthroscopy  september 2005  . lumbar disc surgury  02/2002  . lumbar disc surgury  june 2004  . NASAL RECONSTRUCTION     x 3  . REVERSE SHOULDER ARTHROPLASTY Left 07/30/2018   Procedure: LEFT REVERSE SHOULDER ARTHROPLASTY;  Surgeon: Francena Hanly, MD;  Location: [facility] OR;  Service: Orthopedics;  Laterality: Left;   . s/p ENT surgury     x 3  . s/p left shoulder surgury  feb. 2009  . s/p left TKR  april 2006  . TOTAL KNEE ARTHROPLASTY Right 01/16/2018   Procedure:  RIGHT TOTAL KNEE ARTHROPLASTY;  Surgeon: Beverely Low, MD;  Location: [facility] OR;  Service: Orthopedics;  Laterality: Right;  . TOTAL SHOULDER ARTHROPLASTY Right 09/16/2019   Procedure: Right Reverse shoulder arthroplasty;  Surgeon: Francena Hanly, MD;  Location: [facility] ORS;  Service: Orthopedics;  Laterality: Right;       Family History  Problem Relation Age of Onset  . Bradycardia Father 60       Pacemaker  . Lymphoma Mother   . Alzheimer's disease Mother   . Pancreatic cancer Brother   . Colon cancer Neg Hx     Social History   Tobacco Use  . Smoking status: Never Smoker  . Smokeless tobacco: Never Used  Vaping Use  . Vaping Use: Never used  Substance Use  Topics  . Alcohol use: Yes    Alcohol/week: 12.0 standard drinks    Types: 12 Cans of beer per week    Comment: 3-4 days a week  . Drug use: No    Home Medications Prior to Admission medications   Medication Sig Start Date End Date Taking? Authorizing Provider  amoxicillin (AMOXIL) 500 MG capsule Take 500 mg by mouth once as needed (pre-dental appointment/surgery). 12/14/20  Yes [provider]  apixaban (ELIQUIS) 5 MG TABS tablet Take 1 tablet (5 mg total) by mouth 2 (two) times daily. 12/21/20 01/20/21 Yes Benjiman Core, MD  atorvastatin (LIPITOR) 10 MG tablet Take 1 tablet (10 mg total) by mouth daily. 12/20/20  Yes Corwin Levins, MD  Calcium Polycarbophil (FIBER-CAPS PO) Take 2 tablets by mouth daily.   Yes [provider]  fexofenadine (ALLEGRA) 180 MG tablet Take 180 mg by mouth in the morning and at bedtime.   Yes [provider]  fluticasone (FLONASE) 50 MCG/ACT nasal spray Place 2 sprays into the nose daily as needed for allergies. For allergies.   Yes [provider]  lisinopril (ZESTRIL) 10 MG tablet Take 1 tablet (10 mg total) by mouth daily. 12/20/20  Yes Corwin Levins, MD  naproxen (NAPROSYN) 500 MG tablet Take 1 tablet (500 mg total) by mouth 2 (two) times daily with a meal. 09/17/19  Yes Shuford, French Ana, PA-C  Omega-3 Fatty Acids (FISH OIL) 1200 MG CPDR Take 3,600 mg by mouth daily.   Yes [provider]  penicillin v potassium (VEETID) 500 MG tablet Take 500 mg by mouth once as needed (pre-dental appointment/surgery). 12/18/20  Yes [provider]  VITAMIN D PO Take 2 tablets by mouth daily.   Yes [provider]    Allergies    Dust mite extract, Drug ingredient [bee pollen], and Other  Review of Systems   Review of Systems  Respiratory: Positive for shortness of breath.   Cardiovascular: Positive for chest pain and palpitations.  All other systems reviewed and are negative.   Physical Exam Updated Vital  Signs BP 124/77   Pulse 80   Temp 98 F (36.7 C) (Oral)   Resp 12   Ht 5\' 10"  (1.778 m)   Wt 102.1 kg   SpO2 96%   BMI 32.28 kg/m   Physical Exam Vitals and nursing note reviewed.  Constitutional:      Appearance: He is well-developed.  HENT:     Head: Normocephalic and atraumatic.  Eyes:     Extraocular Movements: Extraocular movements intact.     Pupils: Pupils are equal, round, and reactive to light.  Cardiovascular:     Rate and Rhythm: Normal rate. Rhythm irregular.  Heart sounds: Normal heart sounds.  Pulmonary:     Effort: Pulmonary effort is normal.     Breath sounds: Normal breath sounds.  Abdominal:     General: Bowel sounds are normal.     Palpations: Abdomen is soft.  Musculoskeletal:        General: Normal range of motion.     Cervical back: Normal range of motion and neck supple.  Skin:    General: Skin is warm.     Capillary Refill: Capillary refill takes less than 2 seconds.  Neurological:     General: No focal deficit present.     Mental Status: He is alert and oriented to person, place, and time.  Psychiatric:        Mood and Affect: Mood normal.        Behavior: Behavior normal.     ED Results / Procedures / Treatments   Labs (all labs ordered are listed, but only abnormal results are displayed) Labs Reviewed  BASIC METABOLIC PANEL - Abnormal; Notable for the following components:      Result Value   Glucose, Bld 164 (*)    All other components within normal limits  D-DIMER, QUANTITATIVE (NOT AT [facility]) - Abnormal; Notable for the following components:   D-Dimer, Quant 0.83 (*)    All other components within normal limits  RESP PANEL BY RT-PCR (FLU A&B, COVID) ARPGX2  MAGNESIUM  CBC  TSH    EKG EKG Interpretation  Date/Time:  Saturday January 06 2021 12:48:34 EST Ventricular Rate:  78 PR Interval:    QRS Duration: 87 QT Interval:  373 QTC Calculation: 425 R Axis:   21 Text Interpretation: Atrial fibrillation Ventricular  premature complex No significant change since last tracing Confirmed by Jacalyn Lefevre [phone]) on 01/06/2021 1:08:12 PM   Radiology DG Chest Port 1 View  Result Date: 01/06/2021 CLINICAL DATA:  Shortness of breath EXAM: PORTABLE CHEST 1 VIEW COMPARISON:  12/21/2020 FINDINGS: The heart size and mediastinal contours are within normal limits. Both lungs are clear. Status post bilateral reverse shoulder arthroplasty. IMPRESSION: No acute abnormality of the lungs in AP portable projection. Electronically Signed   By: Lauralyn Primes M.D.   On: 01/06/2021 14:04    Procedures Procedures   Medications Ordered in ED Medications - No data to display  ED Course  I have reviewed the triage vital signs and the nursing notes.  Pertinent labs & imaging results that were available during my care of the patient were reviewed by me and considered in my medical decision making (see chart for details).    MDM Rules/Calculators/A&P                          Pt is in afib.  He's only been on Eliquis since 1/27.  Pt has not had an echo.  CT pe study pending.  Pt signed out to Dr. Hyacinth Meeker at shift change.  Covid negative.  Final Clinical Impression(s) / ED Diagnoses Final diagnoses:  Atrial fibrillation, unspecified type [facility])    Rx / DC Orders ED Discharge Orders    None       Jacalyn Lefevre, MD 01/06/21 1547

## 2021-01-07 ENCOUNTER — Inpatient Hospital Stay (HOSPITAL_COMMUNITY): Payer: Medicare Other

## 2021-01-07 ENCOUNTER — Encounter (HOSPITAL_COMMUNITY): Payer: Self-pay | Admitting: Student in an Organized Health Care Education/Training Program

## 2021-01-07 DIAGNOSIS — I480 Paroxysmal atrial fibrillation: Secondary | ICD-10-CM

## 2021-01-07 DIAGNOSIS — I712 Thoracic aortic aneurysm, without rupture: Secondary | ICD-10-CM | POA: Diagnosis not present

## 2021-01-07 DIAGNOSIS — I700 Atherosclerosis of aorta: Secondary | ICD-10-CM

## 2021-01-07 DIAGNOSIS — I248 Other forms of acute ischemic heart disease: Secondary | ICD-10-CM | POA: Diagnosis not present

## 2021-01-07 LAB — ECHOCARDIOGRAM COMPLETE
Area-P 1/2: 2.42 cm2
Height: 70 in
S' Lateral: 2.7 cm
Weight: 3547.2 oz

## 2021-01-07 LAB — CBC
HCT: 40 % (ref 39.0–52.0)
Hemoglobin: 14.2 g/dL (ref 13.0–17.0)
MCH: 31.5 pg (ref 26.0–34.0)
MCHC: 35.5 g/dL (ref 30.0–36.0)
MCV: 88.7 fL (ref 80.0–100.0)
Platelets: 143 10*3/uL — ABNORMAL LOW (ref 150–400)
RBC: 4.51 MIL/uL (ref 4.22–5.81)
RDW: 12.6 % (ref 11.5–15.5)
WBC: 4.1 10*3/uL (ref 4.0–10.5)
nRBC: 0 % (ref 0.0–0.2)

## 2021-01-07 LAB — BASIC METABOLIC PANEL
Anion gap: 9 (ref 5–15)
BUN: 16 mg/dL (ref 8–23)
CO2: 25 mmol/L (ref 22–32)
Calcium: 8.9 mg/dL (ref 8.9–10.3)
Chloride: 105 mmol/L (ref 98–111)
Creatinine, Ser: 1.02 mg/dL (ref 0.61–1.24)
GFR, Estimated: >60 mL/min (ref >60)
Glucose, Bld: 125 mg/dL — ABNORMAL HIGH (ref 70–99)
Potassium: 4.1 mmol/L (ref 3.5–5.1)
Sodium: 139 mmol/L (ref 135–145)

## 2021-01-07 LAB — APTT
aPTT: 49 seconds — ABNORMAL HIGH (ref 24–36)
aPTT: 47 seconds — ABNORMAL HIGH (ref 24–36)
aPTT: 51 seconds — ABNORMAL HIGH (ref 24–36)

## 2021-01-07 LAB — HEPARIN LEVEL (UNFRACTIONATED)
Heparin Unfractionated: 1.07 IU/mL — ABNORMAL HIGH (ref 0.30–0.70)
Heparin Unfractionated: 0.48 IU/mL (ref 0.30–0.70)

## 2021-01-07 LAB — HIV ANTIBODY (ROUTINE TESTING W REFLEX): HIV Screen 4th Generation wRfx: NONREACTIVE

## 2021-01-07 MED ORDER — HYDROCODONE-ACETAMINOPHEN 10-325 MG PO TABS
1.0000 | ORAL_TABLET | Freq: Four times a day (QID) | ORAL | Status: DC | PRN
  Administered 2021-01-08: 1 via ORAL
  Filled 2021-01-07: qty 1

## 2021-01-07 MED ORDER — SODIUM CHLORIDE 0.9 % IV SOLN
8.0000 mg | Freq: Three times a day (TID) | INTRAVENOUS | Status: DC | PRN
  Filled 2021-01-07: qty 4

## 2021-01-07 MED ORDER — MORPHINE SULFATE (PF) 4 MG/ML IV SOLN
4.0000 mg | INTRAVENOUS | Status: DC | PRN
  Administered 2021-01-07 – 2021-01-09 (×6): 4 mg via INTRAVENOUS
  Filled 2021-01-07 (×6): qty 1

## 2021-01-07 MED ORDER — IOHEXOL 9 MG/ML PO SOLN
ORAL | Status: AC
  Filled 2021-01-07: qty 1000

## 2021-01-07 MED ORDER — IOHEXOL 350 MG/ML SOLN
80.0000 mL | Freq: Once | INTRAVENOUS | Status: AC | PRN
  Administered 2021-01-07: 80 mL via INTRAVENOUS

## 2021-01-07 MED ORDER — IOHEXOL 9 MG/ML PO SOLN
500.0000 mL | ORAL | Status: AC
  Administered 2021-01-07 (×2): 500 mL via ORAL

## 2021-01-07 MED ORDER — NITROGLYCERIN 0.4 MG SL SUBL
SUBLINGUAL_TABLET | SUBLINGUAL | Status: AC
  Administered 2021-01-07: 0.8 mg
  Filled 2021-01-07: qty 2

## 2021-01-07 MED ORDER — METOPROLOL TARTRATE 25 MG PO TABS
25.0000 mg | ORAL_TABLET | Freq: Once | ORAL | Status: AC
  Administered 2021-01-07: 25 mg via ORAL
  Filled 2021-01-07: qty 1

## 2021-01-07 MED ORDER — SIMETHICONE 80 MG PO CHEW
80.0000 mg | CHEWABLE_TABLET | Freq: Four times a day (QID) | ORAL | Status: DC | PRN
  Administered 2021-01-07 – 2021-01-08 (×2): 80 mg via ORAL
  Filled 2021-01-07 (×3): qty 1

## 2021-01-07 NOTE — Progress Notes (Addendum)
Minutes after pt returned from coronary cta procedure pt c/o sharp pain across upper abdomen and lower rib cage w/o nausea., reports difficulty breathing, pt wife at bedside, SWOT rn at bedside assessed vital signs. Pain progressed to pt moaning thriving wincing deep breathing rr 20s, skin color normal w/d. Cardiology called and notified, this rn placed pt on zoll monitor and dinemap.

## 2021-01-07 NOTE — Progress Notes (Signed)
Transporter returned pt from ct. Transporter states pt requesting pain med

## 2021-01-07 NOTE — Progress Notes (Signed)
Pt taken to ct by transport tech and SWOT rn on 2 lither [place] and cardiac monitor. Pt denies cp.

## 2021-01-07 NOTE — Progress Notes (Signed)
Echocardiogram 2D Echocardiogram has been performed.  Todd Barrett 01/07/2021, 11:41 AM

## 2021-01-07 NOTE — Progress Notes (Signed)
ANTICOAGULATION CONSULT NOTE   Pharmacy Consult for heparin Indication: chest pain/ACS  Allergies  Allergen Reactions  . Dust Mite Extract Other (See Comments)    Nasal Congestion  . Drug Ingredient [Bee Pollen] Other (See Comments)    Congestion  . Other Other (See Comments)    Pet Dander- Runny nose    Patient Measurements: Height: 5\' 10"  (177.8 cm) Weight: 100.6 kg (221 lb 11.2 oz) IBW/kg (Calculated) : 73 Heparin Dosing Weight: 94.5 kg  Vital Signs: Temp: 97.7 F (36.5 C) (02/13 1159) Temp Source: Oral (02/13 1159) BP: 105/62 (02/13 1337) Pulse Rate: 71 (02/13 1337)  Labs: Recent Labs    01/06/21 1302 01/06/21 1737 01/06/21 2013 01/07/21 0457 01/07/21 1443  HGB 14.1  --   --  14.2  --   HCT 42.3  --   --  40.0  --   PLT 164  --   --  143*  --   APTT  --   --   --  49* 47*  HEPARINUNFRC  --   --   --  1.07* 0.48  CREATININE 1.10  --   --  1.02  --   TROPONINIHS  --  15 13  --   --     Estimated Creatinine Clearance: 80.1 mL/min (by C-G formula based on SCr of 1.02 mg/dL).   Medical History: Past Medical History:  Diagnosis Date  . ALLERGIC RHINITIS 01/08/2010  . Allergy    dust mites, pet dander  . ANEMIA-NOS 01/08/2010  . Atrial fibrillation with RVR (HCC) 01/19/2018  . CERVICAL RADICULOPATHY, RIGHT 01/08/2010  . CHEST PAIN 01/08/2010  . DIVERTICULOSIS, COLON 01/08/2010  . Dysrhythmia    post op   during PT a fib RVR  . GERD (gastroesophageal reflux disease)    Zantac OTC  prn  . GOUT 01/08/2010   in past/ when has surgery  . HYPERLIPIDEMIA 01/08/2010  . Lumbar disc disease 08/05/2011  . Osteoarthritis of right knee    end stage  . OSTEOARTHRITIS, KNEES, 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    Medications:  See MAR  Assessment: 69 yom with several weeks of epigastric discomfort; has been getting diaphoretic/SOB with minimal walking. On apixaban for hx Afib (LD 2/12@0700 ).  Hgb 14.2, plt 143. Trop 13. D-dimer 0.83. No s/sx of bleeding.   Given recent DOAC,  will monitor both aPTT/HL until they correlate.  2/13 PM update: APTT low, HL therapeutic   Goal of Therapy:  Heparin level 0.3-0.7 units/ml aPTT 66-102 seconds Monitor platelets by anticoagulation protocol: Yes   Plan:  Slight increase in heparin drip to 1400 units/hr F/u 8 hour HL Monitor for s/sx bleeding  3/13, PharmD PGY-1 Acute Care Pharmacy Resident 01/07/2021 3:22 PM

## 2021-01-07 NOTE — Progress Notes (Addendum)
Progress Note  Patient Name: Todd Barrett. Date of Encounter: 01/07/2021  [facility] Cardiologist: Lance Muss, MD   Subjective   Denies any chest pain or SOB overnight, last episode of chest pain was yesterday.   Inpatient Medications    Scheduled Meds: . aspirin EC  81 mg Oral Daily  . atorvastatin  10 mg Oral Daily  . lisinopril  10 mg Oral Daily   Continuous Infusions: . heparin 1,300 Units/hr (01/07/21 0712)   PRN Meds: acetaminophen, nitroGLYCERIN, ondansetron (ZOFRAN) IV   Vital Signs    Vitals:   01/06/21 2320 01/06/21 2348 01/07/21 0433 01/07/21 0820  BP:  120/86 118/85 114/82  Pulse:  70 72 75  Resp:  18 18 18   Temp: 98.3 F (36.8 C) (!) 97.5 F (36.4 C) 98.1 F (36.7 C) 98.3 F (36.8 C)  TempSrc: Oral Oral Oral Oral  SpO2:  100% 98% 97%  Weight:   100.6 kg   Height:   5\' 10"  (1.778 m)     Intake/Output Summary (Last 24 hours) at 01/07/2021 0859 Last data filed at 01/07/2021 0400 Gross per 24 hour  Intake 0 ml  Output --  Net 0 ml   Last 3 Weights 01/07/2021 01/06/2021 12/25/2020  Weight (lbs) 221 lb 11.2 oz 225 lb 227 lb  Weight (kg) 100.562 kg 102.059 kg 102.967 kg      Telemetry    Atrial fibrillation, occasional spike in HR, largely controlled - Personally Reviewed  ECG    Atrial fibrillation, no obvious ST-T wave changes - Personally Reviewed  Physical Exam   GEN: No acute distress.   Neck: No JVD Cardiac: irregularly irregular, no murmurs, rubs, or gallops.  Respiratory: Clear to auscultation bilaterally. GI: Soft, nontender, non-distended  MS: No edema; No deformity. Neuro:  Nonfocal  Psych: Normal affect   Labs    High Sensitivity Troponin:   Recent Labs  Lab 12/21/20 1003 12/21/20 1230 01/06/21 1737 01/06/21 2013  TROPONINIHS 8 7 15 13       Chemistry Recent Labs  Lab 01/06/21 1302 01/07/21 0457  NA 139 139  K 3.9 4.1  CL 104 105  CO2 22 25  GLUCOSE 164* 125*  BUN 23 16  CREATININE 1.10  1.02  CALCIUM 9.1 8.9  GFRNONAA >60 >60  ANIONGAP 13 9     Hematology Recent Labs  Lab 01/06/21 1302 01/07/21 0457  WBC 8.9 4.1  RBC 4.69 4.51  HGB 14.1 14.2  HCT 42.3 40.0  MCV 90.2 88.7  MCH 30.1 31.5  MCHC 33.3 35.5  RDW 12.5 12.6  PLT 164 143*    BNPNo results for input(s): BNP, PROBNP in the last 168 hours.   DDimer  Recent Labs  Lab 01/06/21 1302  DDIMER 0.83*     Radiology    CT Angio Chest PE W and/or Wo Contrast  Result Date: 01/06/2021 CLINICAL DATA:  Chest pain and shortness of breath today. EXAM: CT ANGIOGRAPHY CHEST WITH CONTRAST TECHNIQUE: Multidetector CT imaging of the chest was performed using the standard protocol during bolus administration of intravenous contrast. Multiplanar CT image reconstructions and MIPs were obtained to evaluate the vascular anatomy. CONTRAST:  73mL OMNIPAQUE IOHEXOL 350 MG/ML SOLN COMPARISON:  None. FINDINGS: Cardiovascular: The heart is within normal limits in size. No pericardial effusion. There is fusiform aneurysmal dilatation of the ascending thoracic aorta with maximum measurement of 4.4 cm. Scattered calcifications at the aortic arch. Scattered coronary artery calcifications. The pulmonary arterial tree is well opacified.  No filling defects to suggest pulmonary embolism. Mediastinum/Nodes: No mediastinal or hilar mass or adenopathy. The esophagus is unremarkable. Lungs/Pleura: No acute pulmonary findings. No infiltrates or effusions. No pulmonary edema. No worrisome pulmonary lesions. Mild dependent atelectasis is noted. Upper Abdomen: No significant upper abdominal findings. Small scattered hepatic cysts are noted. Musculoskeletal: No chest wall mass, supraclavicular or axillary adenopathy. The bony thorax is intact.  No worrisome bone lesions. Review of the MIP images confirms the above findings. IMPRESSION: 1. No CT findings for pulmonary embolism. 2. Fusiform aneurysmal dilatation of the ascending thoracic aorta with maximum  measurement of 4.4 cm. Recommend semi-annual imaging followup by CTA or MRA and referral to cardiothoracic surgery if not already obtained. 2010; 121: [id number]. 3. No acute pulmonary findings. 4. Aortic atherosclerosis. Aortic Atherosclerosis (ICD10-I70.0). Electronically Signed   By: Rudie Meyer M.D.   On: 01/06/2021 16:34   DG Chest Port 1 View  Result Date: 01/06/2021 CLINICAL DATA:  Shortness of breath EXAM: PORTABLE CHEST 1 VIEW COMPARISON:  12/21/2020 FINDINGS: The heart size and mediastinal contours are within normal limits. Both lungs are clear. Status post bilateral reverse shoulder arthroplasty. IMPRESSION: No acute abnormality of the lungs in AP portable projection. Electronically Signed   By: Lauralyn Primes M.D.   On: 01/06/2021 14:04    Cardiac Studies   Echo 01/29/2018 LV EF: 55% -  60%   -------------------------------------------------------------------  Indications:   I48.0 Paroxysmal Atrial Fibrillation.   -------------------------------------------------------------------  History:  PMH: Acquired from the patient and from the patient&'s  chart. PMH: Anxiety. Shortness of breath. Anemia. Chest pain.  Risk factors: Dyslipidemia.   -------------------------------------------------------------------  Study Conclusions   - Left ventricle: The cavity size was normal. There was mild  concentric hypertrophy. Systolic function was normal. The  estimated ejection fraction was in the range of 55% to 60%. Wall  motion was normal; there were no regional wall motion  abnormalities. Doppler parameters are consistent with abnormal  left ventricular relaxation (grade 1 diastolic dysfunction).  There was no evidence of elevated ventricular filling pressure by  Doppler parameters.  - Aortic valve: Trileaflet; mildly thickened, mildly calcified  leaflets. Transvalvular velocity was within the normal range.  There was no stenosis. There was mild regurgitation.   - Mitral valve: There was trivial regurgitation.  - Left atrium: The atrium was moderately dilated.  - Right ventricle: The cavity size was normal. Wall thickness was  normal. Systolic function was normal.  - Right atrium: The atrium was normal in size.  - Tricuspid valve: There was mild regurgitation.  - Pulmonary arteries: Systolic pressure was within the normal  range.  - Inferior vena cava: The vessel was normal in size.  - Pericardium, extracardiac: There was no pericardial effusion.     Patient Profile     70 y.o. male with PMH of PAF, HTN, HLD, and OA presented with unstable angina.   Assessment & Plan    1. 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 If coronary CT is negative, may consider TEE DCCV prior to discharge since patient has not completed 3 weeks of anticoagulation.   2. Persistent atrial fibrillation  - remote h/o PAF in 2019, off anticoagulation.  - had recurrence of afib on 12/21/2020, unclear  if related to chest pain, started on Eliquis, staying in afib since  3. Thoracic aortic aneurysm: seen incidentally on CTA of chest, 4.4 cm, will need to semi-annual imaging follow up, next CTA Aug 2022  4. HTN: need aggressive BP control given the thoracic aortic aneurysm  5. HLD  For questions or updates, please contact [facility] Please consult [email or web address] for contact info under        Signed, Azalee Course, PA  01/07/2021, 8:59 AM   Personally seen and examined. Agree with APP above with the following comments: Briefly 70 yo M with return of AF; unclear if CP Is associated with this. Patient notes feeling better back in SR, but notes chest pain and SOB around the time of conversion.  Notes daytime somnolence and apneic  events in his sleep.  Notes he came back from a trip and had increase alcohol use during that time. Exam notable for occasional irregular beat consistent with a PAC Personally reviewed relevant tests; has minimal CAC in his LAD, Aortic Atherosclerosis- continue statin  Would recommend CCTA given atypical symptoms, and echocardiogram.  Based on this will benefit from AAD to keep in SR; low threshold to add diltiazem 30 mg Q6h.  Will need outpatient eval for TAA - Echo Pending - CCTA - heparin conversion to eliquis if ischemic work up is negative (continue ASA but will DC if ischemic work up is negative) - consider AAD after testing - heparin for DVT prophylaxis - labs ordered -full code.  Discussed with patient and wife and nursing.  Reviewed alcohol indiscretions and AF and will need outpatient sleep study.  Christell Constant, MD

## 2021-01-07 NOTE — Progress Notes (Signed)
Arrived to bedside. Pt calmly reports "4/10 sharp pain across upper abdomen/lower diaphragm. Will administer morphine ivp

## 2021-01-07 NOTE — Progress Notes (Signed)
Cardiology md to bedside

## 2021-01-07 NOTE — Significant Event (Signed)
Rapid Response Event Note   Reason for Call :  Abdominal pain  Initial Focused Assessment:  Upon entering the patient's room I could hear him moaning in pain.  The patient was laying in bed writhing in pain.  The patient states that the pain started 30 minutes after eating a chef salad for lunch. He states that the pain is the second worst pain that he has experienced in his life.  He rated the pain being 10/10.  The patient denies nausea or vomiting. He states that the pain is deep and cramp-like.  He admits that he had a small bowel movement earlier this morning.     Interventions:  4mg  of morphine ordered by cardiology  Plan of Care:  The patient will get a CT abdomen at 1700.  CT tech hand delivered the contrast and gave directions on when to drink the contrast   Event Summary:   MD Notified: Cards MD bedside Call Time: 1414 Arrival Time: 1420 End Time:1515  , RN

## 2021-01-07 NOTE — Progress Notes (Signed)
ANTICOAGULATION CONSULT NOTE   Pharmacy Consult for heparin Indication: chest pain/ACS  Allergies  Allergen Reactions  . Dust Mite Extract Other (See Comments)    Nasal Congestion  . Drug Ingredient [Bee Pollen] Other (See Comments)    Congestion  . Other Other (See Comments)    Pet Dander- Runny nose    Patient Measurements: Height: 5\' 10"  (177.8 cm) Weight: 100.6 kg (221 lb 11.2 oz) IBW/kg (Calculated) : 73 Heparin Dosing Weight: 94.5 kg  Vital Signs: Temp: 98.1 F (36.7 C) (02/13 0433) Temp Source: Oral (02/13 0433) BP: 118/85 (02/13 0433) Pulse Rate: 72 (02/13 0433)  Labs: Recent Labs    01/06/21 1302 01/06/21 1737 01/06/21 2013 01/07/21 0457  HGB 14.1  --   --  14.2  HCT 42.3  --   --  40.0  PLT 164  --   --  143*  APTT  --   --   --  49*  HEPARINUNFRC  --   --   --  1.07*  CREATININE 1.10  --   --  1.02  TROPONINIHS  --  15 13  --     Estimated Creatinine Clearance: 80.1 mL/min (by C-G formula based on SCr of 1.02 mg/dL).   Medical History: Past Medical History:  Diagnosis Date  . ALLERGIC RHINITIS 01/08/2010  . Allergy    dust mites, pet dander  . ANEMIA-NOS 01/08/2010  . Atrial fibrillation with RVR (HCC) 01/19/2018  . CERVICAL RADICULOPATHY, RIGHT 01/08/2010  . CHEST PAIN 01/08/2010  . DIVERTICULOSIS, COLON 01/08/2010  . Dysrhythmia    post op   during PT a fib RVR  . GERD (gastroesophageal reflux disease)    Zantac OTC  prn  . GOUT 01/08/2010   in past/ when has surgery  . HYPERLIPIDEMIA 01/08/2010  . Lumbar disc disease 08/05/2011  . Osteoarthritis of right knee    end stage  . OSTEOARTHRITIS, KNEES, 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    Medications:  See MAR  Assessment: 69 yom with several weeks of epigastric discomfort; has been getting diaphoretic/SOB with minimal walking. On apixaban for hx Afib (LD 2/12@0700 ).  Hgb 14.1, plt 164. Trop 15. D-dimer 0.83. No s/sx of bleeding.   Given recent DOAC, will monitor both aPTT/HL until they  correlate.  2/13 AM update:  APTT low Hgb stable  Goal of Therapy:  Heparin level 0.3-0.7 units/ml aPTT 66-102 seconds Monitor platelets by anticoagulation protocol: Yes   Plan:  Inc heparin to 1300 units/hr 1400 heparin level and aPTT  3/13, PharmD, BCPS Clinical Pharmacist Phone: [phone]

## 2021-01-07 NOTE — Progress Notes (Signed)
Patient seen and examined earlier this evening for severe, sharp abdominal pain after CT coronary scan. Patient stated that pain began after eating his lunch. CTA coronaries with no obstructive disease. CT abdome/pelvis without acute pathology. Patient was given morphine, zofran and simethicone with improvement.  Laurance Flatten, MD

## 2021-01-08 ENCOUNTER — Inpatient Hospital Stay (HOSPITAL_COMMUNITY): Payer: Medicare Other

## 2021-01-08 ENCOUNTER — Ambulatory Visit (HOSPITAL_COMMUNITY): Payer: Medicare Other | Admitting: Nurse Practitioner

## 2021-01-08 DIAGNOSIS — E782 Mixed hyperlipidemia: Secondary | ICD-10-CM | POA: Diagnosis not present

## 2021-01-08 DIAGNOSIS — I200 Unstable angina: Secondary | ICD-10-CM | POA: Diagnosis not present

## 2021-01-08 DIAGNOSIS — I10 Essential (primary) hypertension: Secondary | ICD-10-CM

## 2021-01-08 DIAGNOSIS — R1013 Epigastric pain: Secondary | ICD-10-CM

## 2021-01-08 DIAGNOSIS — K8020 Calculus of gallbladder without cholecystitis without obstruction: Secondary | ICD-10-CM

## 2021-01-08 LAB — HEPATIC FUNCTION PANEL
ALT: 492 U/L — ABNORMAL HIGH (ref 0–44)
AST: 322 U/L — ABNORMAL HIGH (ref 15–41)
Albumin: 3.2 g/dL — ABNORMAL LOW (ref 3.5–5.0)
Alkaline Phosphatase: 155 U/L — ABNORMAL HIGH (ref 38–126)
Bilirubin, Direct: 3.3 mg/dL — ABNORMAL HIGH (ref 0.0–0.2)
Indirect Bilirubin: 2.3 mg/dL — ABNORMAL HIGH (ref 0.3–0.9)
Total Bilirubin: 5.6 mg/dL — ABNORMAL HIGH (ref 0.3–1.2)
Total Protein: 6 g/dL — ABNORMAL LOW (ref 6.5–8.1)

## 2021-01-08 LAB — LIPASE, BLOOD: Lipase: 988 U/L — ABNORMAL HIGH (ref 11–51)

## 2021-01-08 LAB — APTT
aPTT: 48 seconds — ABNORMAL HIGH (ref 24–36)
aPTT: 73 seconds — ABNORMAL HIGH (ref 24–36)

## 2021-01-08 LAB — HEPARIN LEVEL (UNFRACTIONATED)
Heparin Unfractionated: 0.45 IU/mL (ref 0.30–0.70)
Heparin Unfractionated: 0.34 IU/mL (ref 0.30–0.70)

## 2021-01-08 MED ORDER — MAGNESIUM HYDROXIDE 400 MG/5ML PO SUSP
30.0000 mL | Freq: Every day | ORAL | Status: AC
  Filled 2021-01-08: qty 30

## 2021-01-08 MED ORDER — MAGNESIUM HYDROXIDE 400 MG/5ML PO SUSP
30.0000 mL | Freq: Every day | ORAL | Status: DC | PRN
  Administered 2021-01-08: 30 mL via ORAL
  Filled 2021-01-08: qty 30

## 2021-01-08 MED ORDER — HEPARIN BOLUS VIA INFUSION
1000.0000 [IU] | Freq: Once | INTRAVENOUS | Status: AC
  Administered 2021-01-08: 1000 [IU] via INTRAVENOUS
  Filled 2021-01-08: qty 1000

## 2021-01-08 NOTE — Progress Notes (Signed)
Progress Note  Patient Name: Todd Barrett. Date of Encounter: 01/08/2021  [facility] Cardiologist: Lance Muss, MD   Subjective   No chest pain or shortness of breath this morning.  He does complain of some epigastric discomfort but not on palpation.  He has decreased bowel sounds and has not had a good bowel movement.  Inpatient Medications    Scheduled Meds: . aspirin EC  81 mg Oral Daily  . atorvastatin  10 mg Oral Daily  . lisinopril  10 mg Oral Daily   Continuous Infusions: . heparin 1,500 Units/hr (01/08/21 0653)  . ondansetron (ZOFRAN) IV     PRN Meds: acetaminophen, HYDROcodone-acetaminophen, morphine injection, nitroGLYCERIN, ondansetron (ZOFRAN) IV, simethicone   Vital Signs    Vitals:   01/07/21 2057 01/08/21 0106 01/08/21 0428 01/08/21 0819  BP: 131/75 123/76 131/76 127/83  Pulse: 67 66 65 64  Resp: 17 17 18 16   Temp: 98 F (36.7 C) 97.9 F (36.6 C) 98.2 F (36.8 C) 97.8 F (36.6 C)  TempSrc: Oral Oral Oral Oral  SpO2: 100% 100% 100% 100%  Weight:      Height:        Intake/Output Summary (Last 24 hours) at 01/08/2021 1000 Last data filed at 01/08/2021 0432 Gross per 24 hour  Intake 500 ml  Output 1250 ml  Net -750 ml   Last 3 Weights 01/07/2021 01/06/2021 12/25/2020  Weight (lbs) 221 lb 11.2 oz 225 lb 227 lb  Weight (kg) 100.562 kg 102.059 kg 102.967 kg      Telemetry    Sinus rhythm/sinus bradycardia- Personally Reviewed  ECG    Not performed today- Personally Reviewed  Physical Exam   GEN: No acute distress.   Neck: No JVD Cardiac: RRR, no murmurs, rubs, or gallops.  Respiratory: Clear to auscultation bilaterally. GI: Soft, nontender, non-distended, decreased bowel sounds MS: No edema; No deformity. Neuro:  Nonfocal  Psych: Normal affect   Labs    High Sensitivity Troponin:   Recent Labs  Lab 12/21/20 1003 12/21/20 1230 01/06/21 1737 01/06/21 2013  TROPONINIHS 8 7 15 13       Chemistry Recent Labs   Lab 01/06/21 1302 01/07/21 0457  NA 139 139  K 3.9 4.1  CL 104 105  CO2 22 25  GLUCOSE 164* 125*  BUN 23 16  CREATININE 1.10 1.02  CALCIUM 9.1 8.9  GFRNONAA >60 >60  ANIONGAP 13 9     Hematology Recent Labs  Lab 01/06/21 1302 01/07/21 0457  WBC 8.9 4.1  RBC 4.69 4.51  HGB 14.1 14.2  HCT 42.3 40.0  MCV 90.2 88.7  MCH 30.1 31.5  MCHC 33.3 35.5  RDW 12.5 12.6  PLT 164 143*    BNPNo results for input(s): BNP, PROBNP in the last 168 hours.   DDimer  Recent Labs  Lab 01/06/21 1302  DDIMER 0.83*     Radiology    CT ABDOMEN PELVIS WO CONTRAST  Result Date: 01/07/2021 CLINICAL DATA:  Abdominal pain, cardiac catheterization today EXAM: CT ABDOMEN AND PELVIS WITHOUT CONTRAST TECHNIQUE: Multidetector CT imaging of the abdomen and pelvis was performed following the standard protocol without IV contrast. Oral enteric contrast was administered. COMPARISON:  None. FINDINGS: Lower chest: No acute abnormality. Hepatobiliary: Fluid attenuation lesions of the left and right lobes of the liver, incompletely characterized on this noncontrast examination although likely cysts or hemangiomata. Small gallstones and sludge in the gallbladder. Gallbladder wall thickening, or biliary dilatation. Pancreas: Unremarkable. No pancreatic ductal dilatation or  surrounding inflammatory changes. Spleen: Normal in size without significant abnormality. Adrenals/Urinary Tract: Adrenal glands are unremarkable. Kidneys are normal, without renal calculi, solid lesion, or hydronephrosis. Excreted contrast in the urinary bladder. Stomach/Bowel: Stomach is within normal limits. Appendix appears normal. No evidence of bowel wall thickening, distention, or inflammatory changes. Sigmoid diverticulosis. Vascular/Lymphatic: Aortic atherosclerosis. No enlarged abdominal or pelvic lymph nodes. Reproductive: No mass or other significant abnormality. Other: Status post bilateral inguinal hernia repair. No abdominopelvic  ascites. Musculoskeletal: No acute or significant osseous findings. IMPRESSION: 1. No acute noncontrast CT findings of the abdomen or pelvis to explain abdominal pain. 2. Small gallstones and sludge in the gallbladder without evidence of acute cholecystitis. 3. Sigmoid diverticulosis without evidence of acute diverticulitis. Aortic Atherosclerosis (ICD10-I70.0). Electronically Signed   By: Lauralyn Primes M.D.   On: 01/07/2021 18:46   CT Angio Chest PE W and/or Wo Contrast  Result Date: 01/06/2021 CLINICAL DATA:  Chest pain and shortness of breath today. EXAM: CT ANGIOGRAPHY CHEST WITH CONTRAST TECHNIQUE: Multidetector CT imaging of the chest was performed using the standard protocol during bolus administration of intravenous contrast. Multiplanar CT image reconstructions and MIPs were obtained to evaluate the vascular anatomy. CONTRAST:  65mL OMNIPAQUE IOHEXOL 350 MG/ML SOLN COMPARISON:  None. FINDINGS: Cardiovascular: The heart is within normal limits in size. No pericardial effusion. There is fusiform aneurysmal dilatation of the ascending thoracic aorta with maximum measurement of 4.4 cm. Scattered calcifications at the aortic arch. Scattered coronary artery calcifications. The pulmonary arterial tree is well opacified. No filling defects to suggest pulmonary embolism. Mediastinum/Nodes: No mediastinal or hilar mass or adenopathy. The esophagus is unremarkable. Lungs/Pleura: No acute pulmonary findings. No infiltrates or effusions. No pulmonary edema. No worrisome pulmonary lesions. Mild dependent atelectasis is noted. Upper Abdomen: No significant upper abdominal findings. Small scattered hepatic cysts are noted. Musculoskeletal: No chest wall mass, supraclavicular or axillary adenopathy. The bony thorax is intact.  No worrisome bone lesions. Review of the MIP images confirms the above findings. IMPRESSION: 1. No CT findings for pulmonary embolism. 2. Fusiform aneurysmal dilatation of the ascending thoracic  aorta with maximum measurement of 4.4 cm. Recommend semi-annual imaging followup by CTA or MRA and referral to cardiothoracic surgery if not already obtained. 2010; 121: [id number]. 3. No acute pulmonary findings. 4. Aortic atherosclerosis. Aortic Atherosclerosis (ICD10-I70.0). Electronically Signed   By: Rudie Meyer M.D.   On: 01/06/2021 16:34   CT CORONARY MORPH W/CTA COR W/SCORE W/CA W/CM &/OR WO/CM  Addendum Date: 01/08/2021   ADDENDUM REPORT: 01/08/2021 08:07 EXAM: OVER-READ INTERPRETATION  CT CHEST The following report is an over-read performed by radiologist Dr. Royal [facility] on 01/08/2021. This over-read does not include interpretation of cardiac or coronary anatomy or pathology. The coronary calcium score and cardiac CTA interpretation by the cardiologist is attached. COMPARISON:  None. FINDINGS: Aortic atherosclerosis. Within the visualized portions of the thorax there are no suspicious appearing pulmonary nodules or masses, there is no acute consolidative airspace disease, no pleural effusions, no pneumothorax and no lymphadenopathy. Visualized portions of the upper abdomen are unremarkable. There are no aggressive appearing lytic or blastic lesions noted in the visualized portions of the skeleton. IMPRESSION: 1.  Aortic Atherosclerosis (ICD10-I70.0). Electronically Signed   By: Trudie Reed M.D.   On: 01/08/2021 08:07   Result Date: 01/08/2021 CLINICAL DATA:  70 Year-old White Male EXAM: Cardiac/Coronary  CTA TECHNIQUE: The patient was scanned on a [employer]. FINDINGS: A 100 kV prospective scan was triggered in the  descending thoracic aorta at 111 HU's. Axial non-contrast 3 mm slices were carried out through the heart. The data set was analyzed on a dedicated work station and scored using the Agatson method. Gantry rotation speed was 250 msecs and collimation was .6 mm. No beta blockade and 0.8 mg of sl NTG was given. The 3D data set was reconstructed in  5% intervals of the 67-82 % of the R-R cycle. Diastolic phases were analyzed on a dedicated work station using MPR, MIP and VRT modes. The patient received 80 cc of contrast. Aorta: Upper limit of normal: 38 mm. Aortic Atherosclerosis noted. No dissection. Aortic Valve:  Tri-leaflet.  Annular calcification noted. Coronary Arteries:  Normal coronary origin.  Right dominance. Coronary calcium score of 36. This was 29th percentile for age, sex, and race matched control. RCA is a large dominant artery that gives rise to PDA and PLA. There is no plaque. Left main is a large artery that gives rise to LAD and LCX arteries. There is no plaque. LAD is a large vessel that gives off a D1 vessel. There is a minimal, non-obstructive calcified plaque (1-24%) in the proximal LAD. LCX is a non-dominant artery that gives rise to two small OM branches. There is no plaque. Other findings: Normal pulmonary vein drainage into the left atrium. Normal left atrial appendage without a thrombus. Normal size of the pulmonary artery. Extra-cardiac findings: See attached radiology report for non-cardiac structures. IMPRESSION: 1. Coronary calcium score of 36. This was 29th percentile for age, sex, and race matched control. 2. Normal coronary origin with right dominance. 3. Aortic Atherosclerosis noted. 4. CAD-RADS 1. 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 Consider non-atherosclerotic causes of chest pain. Consider preventive therapy and risk factor modification. Electronically Signed: By: Riley Lam MD On: 01/07/2021 14:30   DG Chest Port 1 View  Result Date: 01/06/2021 CLINICAL DATA:  Shortness of breath EXAM: PORTABLE CHEST 1 VIEW COMPARISON:  12/21/2020 FINDINGS: The heart size and mediastinal contours are within normal limits. Both lungs are clear. Status post bilateral reverse shoulder arthroplasty. IMPRESSION: No acute abnormality of the lungs in AP portable projection. Electronically Signed   By: Lauralyn Primes M.D.   On: 01/06/2021 14:04   ECHOCARDIOGRAM COMPLETE  Result Date: 01/07/2021    ECHOCARDIOGRAM REPORT   Patient Name:   Todd Barrett Date of Exam: 01/07/2021 Medical Rec #:  [id number]          Height:       70.0 in Accession #:    [id number]         Weight:       221.7 lb Date of Birth:  12-12-1950          BSA:          2.181 m Patient Age:    69 years           BP:           118/85 mmHg Patient Gender: M                  HR:           58 bpm. Exam Location:  Inpatient Procedure: 2D Echo, Color Doppler and Cardiac Doppler Indications:    Acute Ischemic Heart Disease i24.9  History:        Patient has prior history of Echocardiogram examinations, most                 recent 01/29/2018.  Arrythmias:Atrial Fibrillation; Risk                 Factors:Hypertension and Dyslipidemia.  Sonographer:    Irving Burton Senior RDCS Referring Phys: [id number] MATTHEW A CARLISLE IMPRESSIONS  1. Left ventricular ejection fraction, by estimation, is 50 to 55%. The left ventricle has low normal function. The left ventricle has no regional wall motion abnormalities. Left ventricular diastolic parameters were normal.  2. Right ventricular systolic function is normal. The right ventricular size is normal.  3. The mitral valve is grossly normal. No evidence of mitral valve regurgitation.  4. The aortic valve is tricuspid. There is mild calcification of the aortic valve. There is mild thickening of the aortic valve. Aortic valve regurgitation is not visualized. No aortic stenosis is present.  5. Aortic dilatation noted. There is mild to moderate dilatation of the aortic root, measuring 44 mm. There is borderline dilatation of the ascending aorta, measuring 37 mm. There is mild dilatation of the transverse aorta, measuring 37 mm. Comparison(s): A prior study was performed on 01/29/2018. Prior images reviewed side by side. Slight decrease in LV function, aortic dilation noted. FINDINGS  Left Ventricle: Left ventricular ejection fraction,  by estimation, is 50 to 55%. The left ventricle has low normal function. The left ventricle has no regional wall motion abnormalities. The left ventricular internal cavity size was small. There is no left ventricular hypertrophy. Left ventricular diastolic parameters were normal. Right Ventricle: The right ventricular size is normal. No increase in right ventricular wall thickness. Right ventricular systolic function is normal. Left Atrium: Left atrial size was normal in size. Right Atrium: Right atrial size was normal in size. Normal Area- Volume is discordant with visual estimates. Pericardium: Trivial pericardial effusion is present. Mitral Valve: The mitral valve is grossly normal. No evidence of mitral valve regurgitation. Tricuspid Valve: The tricuspid valve is not well visualized. Tricuspid valve regurgitation is not demonstrated. Aortic Valve: The aortic valve is tricuspid. There is mild calcification of the aortic valve. There is mild thickening of the aortic valve. There is mild aortic valve annular calcification. Aortic valve regurgitation is not visualized. No aortic stenosis  is present. Pulmonic Valve: The pulmonic valve was not well visualized. Pulmonic valve regurgitation is not visualized. No evidence of pulmonic stenosis. Aorta: Aortic dilatation noted. There is mild to moderate dilatation of the aortic root, measuring 44 mm. There is borderline dilatation of the ascending aorta, measuring 37 mm. There is mild dilatation of the transverse aorta, measuring 37 mm. IAS/Shunts: The atrial septum is grossly normal.  LEFT VENTRICLE PLAX 2D LVIDd:         3.80 cm  Diastology LVIDs:         2.70 cm  LV e' medial:    8.92 cm/s LV PW:         1.30 cm  LV E/e' medial:  6.3 LV IVS:        1.60 cm  LV e' lateral:   11.00 cm/s LVOT diam:     2.40 cm  LV E/e' lateral: 5.1 LV SV:         81 LV SV Index:   37 LVOT Area:     4.52 cm  RIGHT VENTRICLE RV S prime:     10.70 cm/s TAPSE (M-mode): 2.4 cm LEFT ATRIUM            Index       RIGHT ATRIUM           Index LA diam:  3.60 cm 1.65 cm/m  RA Area:     38.70 cm LA Vol (A4C): 52.6 ml 24.12 ml/m RA Volume:   162.00 ml 74.29 ml/m  AORTIC VALVE LVOT Vmax:   87.50 cm/s LVOT Vmean:  65.900 cm/s LVOT VTI:    0.178 m  AORTA Ao Root diam: 4.40 cm Ao Asc diam:  3.70 cm MITRAL VALVE MV Area (PHT): 2.42 cm    SHUNTS MV Decel Time: 313 msec    Systemic VTI:  0.18 m MV E velocity: 56.60 cm/s  Systemic Diam: 2.40 cm MV A velocity: 25.30 cm/s MV E/A ratio:  2.24 Riley LamMahesh Chandrasekhar MD Electronically signed by Riley LamMahesh Chandrasekhar MD Signature Date/Time: 01/07/2021/12:53:44 PM    Final     Cardiac Studies   2D echocardiogram (01/07/2021)  IMPRESSIONS    1. Left ventricular ejection fraction, by estimation, is 50 to 55%. The  left ventricle has low normal function. The left ventricle has no regional  wall motion abnormalities. Left ventricular diastolic parameters were  normal.  2. Right ventricular systolic function is normal. The right ventricular  size is normal.  3. The mitral valve is grossly normal. No evidence of mitral valve  regurgitation.  4. The aortic valve is tricuspid. There is mild calcification of the  aortic valve. There is mild thickening of the aortic valve. Aortic valve  regurgitation is not visualized. No aortic stenosis is present.  5. Aortic dilatation noted. 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 There is borderline dilatation of the  ascending aorta, measuring 37 mm. There is mild dilatation of the  transverse aorta, measuring 37 mm.   Comparison(s): A prior study was performed on 01/29/2018. Prior images  reviewed side by side. Slight decrease in LV function, aortic dilation  noted.   Coronary CTA (01/07/2021)  IMPRESSION: 1. Coronary calcium score of 36. This was 29th percentile for age, sex, and race matched control.  2. Normal coronary origin with right dominance.  3. Aortic  Atherosclerosis noted.  4. CAD-RADS 1. 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 Consider non-atherosclerotic causes of chest pain. Consider preventive therapy and risk factor modification.  Patient Profile     70 y.o. male with PMH of PAF, HTN, HLD, and OA presented with unstable angina.  Assessment & Plan    1: Chest pain-patient had substernal chest pain and A. fib.  Converted to sinus rhythm.  His enzymes are negative.  His coronary CTA revealed a coronary calcium score 36 with no evidence of CAD.  He is currently pain-free.  His EKG showed no acute changes.  His 2D echo was essentially normal.  2: PAF-had episode of PAF several years ago and again on admission.  He does admit to [place]  He also has obstructive sleep apnea.  He converted to sinus rhythm.  He is on Eliquis oral anticoagulation as an outpatient but currently is on heparin pending determination of ongoing work-up. This patients CHA2DS2-VASc Score and unadjusted Ischemic Stroke Rate (% per year) is equal to 3.2 % stroke rate/year from a score of 3  Above score calculated as 1 point each if present [CHF, HTN, DM, Vascular=MI/PAD/Aortic Plaque, Age if 65-74, or Male] Above score calculated as 2 points each if present [Age > 75, or Stroke/TIA/TE]  3: Thoracic aortic aneurysm-measures 4.4 cm on CTA.  We will continue to follow this on annual basis  4: Essential hypertension-blood pressure well  controlled on lisinopril  5: Hyperlipidemia-on statin therapy.  Cholesterol denies hospitalization was 145 with an LDL of 77 and HDL of 60.  6: Abdominal pain-he has epigastric pain.  Has not had a good bowel movement.  His abdominal CT showed's sludge and small gallstones in his Bladder but no evidence of cholecystitis and no other significant findings.  We will get GI to evaluate prior to transitioning him back to Eliquis and discharging him home.   For questions  or updates, please contact [facility] Please consult [email or web address] for contact info under        Signed, Nanetta Batty, MD  01/08/2021, 10:00 AM

## 2021-01-08 NOTE — Progress Notes (Signed)
ANTICOAGULATION CONSULT NOTE  Pharmacy Consult for heparin Indication: Afib  Allergies  Allergen Reactions  . Dust Mite Extract Other (See Comments)    Nasal Congestion  . Drug Ingredient [Bee Pollen] Other (See Comments)    Congestion  . Other Other (See Comments)    Pet Dander- Runny nose    Patient Measurements: Height: 5\' 10"  (177.8 cm) Weight: 100.6 kg (221 lb 11.2 oz) IBW/kg (Calculated) : 73 Heparin Dosing Weight: 94.5 kg  Vital Signs: Temp: 98 F (36.7 C) (02/13 2057) Temp Source: Oral (02/13 2057) BP: 131/75 (02/13 2057) Pulse Rate: 67 (02/13 2057)  Labs: Recent Labs    01/06/21 1302 01/06/21 1737 01/06/21 2013 01/07/21 0457 01/07/21 1443 01/07/21 2311  HGB 14.1  --   --  14.2  --   --   HCT 42.3  --   --  40.0  --   --   PLT 164  --   --  143*  --   --   APTT  --   --   --  49* 47* 51*  HEPARINUNFRC  --   --   --  1.07* 0.48 0.45  CREATININE 1.10  --   --  1.02  --   --   TROPONINIHS  --  15 13  --   --   --     Estimated Creatinine Clearance: 80.1 mL/min (by C-G formula based on SCr of 1.02 mg/dL).   Assessment: 70 y.o. male with h/o Afib, Elqius on hold, for heparin.  APTT below goal  Goal of Therapy:  Heparin level 0.3-0.7 units/ml aPTT 66-102 seconds Monitor platelets by anticoagulation protocol: Yes   Plan:  Increase Heparin 1500 units/hr  65, PharmD, BCPS

## 2021-01-08 NOTE — Progress Notes (Signed)
ANTICOAGULATION CONSULT NOTE  Pharmacy Consult for heparin Indication: Afib  Allergies  Allergen Reactions  . Dust Mite Extract Other (See Comments)    Nasal Congestion  . Drug Ingredient [Bee Pollen] Other (See Comments)    Congestion  . Other Other (See Comments)    Pet Dander- Runny nose    Patient Measurements: Height: 5\' 10"  (177.8 cm) Weight: 100.6 kg (221 lb 11.2 oz) IBW/kg (Calculated) : 73 Heparin Dosing Weight: 94.5 kg  Vital Signs: Temp: 98.3 F (36.8 C) (02/14 1211) Temp Source: Oral (02/14 1211) BP: 128/72 (02/14 1211) Pulse Rate: 59 (02/14 1211)  Labs: Recent Labs    01/06/21 1302 01/06/21 1737 01/06/21 2013 01/07/21 0457 01/07/21 0457 01/07/21 1443 01/07/21 2311 01/08/21 1248  HGB 14.1  --   --  14.2  --   --   --   --   HCT 42.3  --   --  40.0  --   --   --   --   PLT 164  --   --  143*  --   --   --   --   APTT  --   --   --  49*   < > 47* 51* 48*  HEPARINUNFRC  --   --   --  1.07*   < > 0.48 0.45 0.34  CREATININE 1.10  --   --  1.02  --   --   --   --   TROPONINIHS  --  15 13  --   --   --   --   --    < > = values in this interval not displayed.    Estimated Creatinine Clearance: 80.1 mL/min (by C-G formula based on SCr of 1.02 mg/dL).   Assessment: 70 y.o. male with h/o Afib, Elqius on hold for possible procedures, pharmacy dosing heparin.  - APTT below goal after adjustment to 1500 units/hour.  Goal of Therapy:  Heparin level 0.3-0.7 units/ml aPTT 66-102 seconds Monitor platelets by anticoagulation protocol: Yes   Plan:  - Heparin 1000 unit IV bolus and increase infusion to 1750 units/hour - Heparin level in 6 hours  65, PharmD Candidate [employer]

## 2021-01-08 NOTE — Consult Note (Addendum)
[facility] Consult: 12:22 PM 01/08/2021  LOS: 2 days    Referring Provider: Dr Adora Fridge  Primary Care Physician:  Biagio Borg, MD Primary Gastroenterologist:  Dr. Henrene Pastor.       Reason for Consultation: Upper abdominal/epigastric pain.   HPI: Todd Barrett. is a 70 y.o. male.  PMH polyarticular osteoarthritis.  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  Gout.  Adenomatous colon polyp GERD. Additional to multiple joint/orthopedic surgeries he has undergone bilateral inguinal hernia repair. 02/2017 colonoscopy for surveillance of previous adenomatous polyps in 2004.  Prior colonoscopies 2004, 2008, 2013.  Multiple left and right-sided diverticulosis.  Nonbleeding internal hemorrhoids.  Otherwise normal study.  Patient initiated on Eliquis 1/27 for atrial fibrillation.  He was assessed and released from the ED.  Has not resulted in any unusual bleeding or bruising.  Lab work of 1/27 revealed T bili 1.3.  Alk phos 56.  AST/ALT 74/50.  Lipase 34.   From 2/4 -01/05/2021 he vacationed in [place] in the [place].  During his stay his beer intake and food intake increase.  Normally he drinks 2 or 3 beers 4 to 5 days a week.  During vacation he drank up to 12 beers in a 24-hour period, sometimes bloody Mary's.  Did not have any GI symptoms however.  Presented to the ED 2 days ago with midsternal pain while he was feeding his dogs.  Pain resolved after full dose aspirin and 1 sublingual ntg.    Yesterday, after eating a Caesar salad he developed acute pain across his upper abdomen.  Started in the left upper quadrant, radiated across to the left upper quadrant.  Constant in nature.  Pain not similar to his infrequent bouts of reflux associated  mid to upper esophageal mild burning for which he takes H2 blockers or antacid.  Yesterday's pain eventually subsided after dose of morphine.  Never entirely resolved.  Within the last few hours he has developed recurrent severe pain, 7-8/10 intensity.  Pain still across the upper abdomen but now radiating into the lower abdomen.  No association with nausea, vomiting, altered bowel habits.  For orthopedic issues, the patient takes naproxen twice daily on a chronic basis.  Cardiac enzymes were negative.  A. fib now converted to sinus rhythm.  No anemia, no leukocytosis or leukopenia.  Platelets 143, baseline 160s to 180s. 01/07/2021 CTAP w/o contrast.  No findings to explain abdominal pain.  Small stones and sludge in the gallbladder but no evidence of cholecystitis.  Simple sigmoid diverticulosis. 01/07/2019 two 2D echo.  EF 50 to 55%.  Normal diastolic parameters.  No clinically significant structural valvular disease/regurgitation.  Aortic root, ascending aorta, transverse aorta dilatation. Dr. Gwenlyn Found wanted GI evaluation prior to resuming pt's Eliquis and because the patient has ongoing abdominal pain.  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  Alcohol intake as noted above.   Family history pertinent for a brother who is  deceased and had pancreatic cancer.    Past Medical History:  Diagnosis Date  . ALLERGIC RHINITIS 01/08/2010  . Allergy    dust mites, pet dander  . ANEMIA-NOS 01/08/2010  . Atrial fibrillation with RVR ([place]) 01/19/2018  . CERVICAL RADICULOPATHY, RIGHT 01/08/2010  . CHEST PAIN 01/08/2010  . DIVERTICULOSIS, COLON 01/08/2010  . Dysrhythmia    post op   during PT a fib RVR  . GERD (gastroesophageal reflux disease)    Zantac OTC  prn  . GOUT 01/08/2010   in past/ when has surgery  . HYPERLIPIDEMIA 01/08/2010  . Lumbar disc disease 08/05/2011  . Osteoarthritis of right knee    end stage  . OSTEOARTHRITIS, KNEES, BILATERAL  01/08/2010    Past Surgical History:  Procedure Laterality Date  . ANTERIOR CERVICAL DECOMP/DISCECTOMY FUSION  07/23/2012   Procedure: ANTERIOR CERVICAL DECOMPRESSION/DISCECTOMY FUSION 2 LEVELS;  Surgeon: Otilio Connors, MD;  Location: [facility];  Service: Neurosurgery;  Laterality: N/A;  Cervical three-four, four-five, redo Cervical six-seven anterior cervical decompression, discectomy fusion with allograft and plating  . BACK SURGERY  03/2004, 02/2005  . CERVICAL SPINE SURGERY  05/2010  . clavicle facture  nov. 2009   100% displacement  . COLONOSCOPY    . HERNIA REPAIR     inguinal hernia/right side/ 70 years old  . JOINT REPLACEMENT     Left knee 2006  . KNEE ARTHROSCOPY     total of 5  . left knee arthroscopy  september 2005  . lumbar disc surgury  02/2002  . lumbar disc surgury  june 2004  . NASAL RECONSTRUCTION     x 3  . REVERSE SHOULDER ARTHROPLASTY Left 07/30/2018   Procedure: LEFT REVERSE SHOULDER ARTHROPLASTY;  Surgeon: Justice Britain, MD;  Location: [facility];  Service: Orthopedics;  Laterality: Left;  151mn  . s/p ENT surgury     x 3  . s/p left shoulder surgury  feb. 2009  . s/p left TKR  april 2006  . TOTAL KNEE ARTHROPLASTY Right 01/16/2018   Procedure: RIGHT TOTAL KNEE ARTHROPLASTY;  Surgeon: NNetta Cedars MD;  [place] [facility]  Service: Orthopedics;  Laterality: Right;  . TOTAL SHOULDER ARTHROPLASTY Right 09/16/2019   Procedure: Right Reverse shoulder arthroplasty;  Surgeon: SJustice Britain MD;  Location: [facility];  Service: Orthopedics;  Laterality: Right;    Prior to Admission medications   Medication Sig Start Date End Date Taking? Authorizing Provider  apixaban (ELIQUIS) 5 MG TABS tablet Take 1 tablet (5 mg total) by mouth 2 (two) times daily. 12/21/20 01/20/21 Yes PDavonna Belling MD  atorvastatin (LIPITOR) 10 MG tablet Take 1 tablet (10 mg total) by mouth daily. 12/20/20  Yes JBiagio Borg MD  Calcium Polycarbophil (FIBER-CAPS PO) Take 2 tablets by mouth daily.   Yes  [provider]  fexofenadine (ALLEGRA) 180 MG tablet Take 180 mg by mouth in the morning and at bedtime.   Yes [provider]  fluticasone (FLONASE) 50 MCG/ACT nasal spray Place 2 sprays into the nose daily as needed for allergies. For allergies.   Yes [provider]  lisinopril (ZESTRIL) 10 MG tablet Take 1 tablet (10 mg total) by mouth daily. 12/20/20  Yes JBiagio Borg MD  naproxen (NAPROSYN) 500 MG tablet Take 1 tablet (500 mg total) by mouth 2 (two) times daily with a meal. 09/17/19  Yes Shuford, TOlivia Mackie PA-C  Omega-3 Fatty Acids (FISH OIL) 1200 MG CPDR Take 3,600 mg by mouth daily.   Yes [provider]  VITAMIN D PO Take 2 tablets by mouth daily.   Yes [provider]  amoxicillin (AMOXIL) 500 MG capsule Take 500 mg by mouth once as needed (pre-dental appointment/surgery). 12/14/20   [provider]  penicillin v potassium (VEETID) 500 MG tablet Take 500 mg by mouth once as needed (pre-dental appointment/surgery). 12/18/20   [provider]    Scheduled Meds: . aspirin EC  81 mg Oral Daily  . atorvastatin  10 mg Oral Daily  . lisinopril  10 mg Oral Daily   Infusions: . heparin 1,500 Units/hr (01/08/21 0653)  . ondansetron (ZOFRAN) IV     PRN Meds: acetaminophen, HYDROcodone-acetaminophen, magnesium hydroxide, morphine injection, nitroGLYCERIN, ondansetron (ZOFRAN) IV, simethicone   Allergies as of 01/06/2021 - Review Complete 01/06/2021  Allergen Reaction Noted  . Dust mite extract Other (See Comments) 07/20/2012  . Drug ingredient [bee pollen] Other (See Comments) 01/06/2021  . Other Other (See Comments) 01/08/2018    Family History  Problem Relation Age of Onset  . Bradycardia Father 93       Pacemaker  . Lymphoma Mother   . Alzheimer's disease Mother   . Pancreatic cancer Brother   . Colon cancer Neg Hx     Social History   Socioeconomic History  . Marital status: Married    Spouse name: Not on file   . Number of children: Not on file  . Years of education: Not on file  . Highest education level: Not on file  Occupational History  . 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 Smoking status: Never Smoker  . Smokeless tobacco: Never Used  Vaping Use  . Vaping Use: Never used  Substance and Sexual Activity  . Alcohol use: Yes    Alcohol/week: 12.0 standard drinks    Types: 12 Cans of beer per week    Comment: 3-4 days a week  . Drug use: No  . Sexual activity: Yes  Other Topics Concern  . Not on file  Social History Narrative  . Not on file   Social Determinants of Health   Financial Resource Strain: Low Risk   . Difficulty of Paying Living Expenses: Not hard at all  Food Insecurity: No Food Insecurity  . Worried About Charity fundraiser in the Last Year: Never true  . Ran Out of Food in the Last Year: Never true  Transportation Needs: No Transportation Needs  . Lack of Transportation (Medical): No  . Lack of Transportation (Non-Medical): No  Physical Activity: Sufficiently Active  . Days of Exercise per Week: 5 days  . Minutes of Exercise per Session: 30 min  Stress: No Stress Concern Present  . Feeling of Stress : Not at all  Social Connections: Socially Integrated  . Frequency of Communication with Friends and Family: More than three times a week  . Frequency of Social Gatherings with Friends and Family: More than three times a week  . Attends Religious Services: 1 to 4 times per year  . Active Member of Clubs or Organizations: Yes  . Attends Archivist Meetings: 1 to 4 times per year  . Marital Status: Married  Human resources officer Violence: Not on file    REVIEW OF SYSTEMS: Constitutional: Normally healthy and high functioning. ENT:  No nose bleeds Pulm: No shortness of breath, no cough.  When the abdominal pain is present, taking a deep breath accelerates the pain. CV:  No palpitations,  no LE edema.  GU:  No  hematuria, no frequency GI: See HPI. Heme: No unusual or excessive bleeding or bruising. Transfusions: None. Neuro:  No headaches, no peripheral tingling or numbness.  No seizures, no syncope. Derm:  No itching, no rash or sores.  Endocrine:  No sweats or chills.  No polyuria or dysuria Immunization: Vaccinated and boosted for COVID-19. Travel: As per HPI.   PHYSICAL EXAM: Vital signs in last 24 hours: Vitals:   01/08/21 0819 01/08/21 1211  BP: 127/83 128/72  Pulse: 64 (!) 59  Resp: 16 16  Temp: 97.8 F (36.6 C) 98.3 F (36.8 C)  SpO2: 100% 97%   Wt Readings from Last 3 Encounters:  01/07/21 100.6 kg  12/25/20 103 kg  12/21/20 103 kg    General: Pleasant, well-appearing but somewhat uncomfortable. Head: No facial asymmetry or swelling.  No signs of head trauma. Eyes: Scleral icterus.  No conjunctival pallor.  EOMI Ears: Not hard of hearing Nose: No discharge or congestion Mouth: Tongue midline.  Mucosa moist, pink, clear.  Good dentition. Neck: No JVD, no masses, no thyromegaly. Lungs: Clear bilaterally.  No labored breathing or cough Heart: RRR.  No MRG.  S1, S2 present. Abdomen: Soft.  Mild to moderate tenderness across the upper abdomen and less so in the lower quadrants bilaterally.  No guarding or rebound.  Active bowel sounds.  No organomegaly or distention..   Rectal: Deferred Musc/Skeltl: Joint redness, swelling or gross deformities. Extremities: No CCE. Neurologic: Oriented x3.  Fluid speech.  Moves all 4 limbs.  No tremors, no gross weakness. Skin: No rash, no sores. Nodes: No cervical adenopathy, no thyromegaly. Psych: Cooperative, pleasant, calm.  Intake/Output from previous day: 02/13 0701 - 02/14 0700 In: 500 [I.V.:250; IV Piggyback:250] Out: 1250 [Urine:1250] Intake/Output this shift: No intake/output data recorded.  LAB RESULTS: Recent Labs    01/06/21 1302 01/07/21 0457  WBC 8.9 4.1  HGB 14.1 14.2  HCT 42.3 40.0  PLT 164 143*    BMET Lab Results  Component Value Date   NA 139 01/07/2021   NA 139 01/06/2021   NA 138 12/21/2020   K 4.1 01/07/2021   K 3.9 01/06/2021   K 4.4 12/21/2020   CL 105 01/07/2021   CL 104 01/06/2021   CL 106 12/21/2020   CO2 25 01/07/2021   CO2 22 01/06/2021   CO2 22 12/21/2020   GLUCOSE 125 (H) 01/07/2021   GLUCOSE 164 (H) 01/06/2021   GLUCOSE 154 (H) 12/21/2020   BUN 16 01/07/2021   BUN 23 01/06/2021   BUN 22 12/21/2020   CREATININE 1.02 01/07/2021   CREATININE 1.10 01/06/2021   CREATININE 1.15 12/21/2020   CALCIUM 8.9 01/07/2021   CALCIUM 9.1 01/06/2021   CALCIUM 9.5 12/21/2020   LFT No results for input(s): PROT, ALBUMIN, AST, ALT, ALKPHOS, BILITOT, BILIDIR, IBILI in the last 72 hours. PT/INR Lab Results  Component Value Date   INR 1.03 07/23/2012   Hepatitis Panel No results for input(s): HEPBSAG, HCVAB, HEPAIGM, HEPBIGM in the last 72 hours. C-Diff No components found for: CDIFF Lipase     Component Value Date/Time   LIPASE 34 12/21/2020 1003    Drugs of Abuse  No results found for: LABOPIA, COCAINSCRNUR, LABBENZ, AMPHETMU, THCU, LABBARB   RADIOLOGY STUDIES: CT ABDOMEN PELVIS WO CONTRAST  Result Date: 01/07/2021 CLINICAL DATA:  Abdominal pain, cardiac catheterization today EXAM: CT ABDOMEN AND PELVIS WITHOUT CONTRAST TECHNIQUE: Multidetector CT imaging of the abdomen and pelvis was performed following the  standard protocol without IV contrast. Oral enteric contrast was administered. COMPARISON:  None. FINDINGS: Lower chest: No acute abnormality. Hepatobiliary: Fluid attenuation lesions of the left and right lobes of the liver, incompletely characterized on this noncontrast examination although likely cysts or hemangiomata. Small gallstones and sludge in the gallbladder. Gallbladder wall thickening, or biliary dilatation. Pancreas: Unremarkable. No pancreatic ductal dilatation or surrounding inflammatory changes. Spleen: Normal in size without significant  abnormality. Adrenals/Urinary Tract: Adrenal glands are unremarkable. Kidneys are normal, without renal calculi, solid lesion, or hydronephrosis. Excreted contrast in the urinary bladder. Stomach/Bowel: Stomach is within normal limits. Appendix appears normal. No evidence of bowel wall thickening, distention, or inflammatory changes. Sigmoid diverticulosis. Vascular/Lymphatic: Aortic atherosclerosis. No enlarged abdominal or pelvic lymph nodes. Reproductive: No mass or other significant abnormality. Other: Status post bilateral inguinal hernia repair. No abdominopelvic ascites. Musculoskeletal: No acute or significant osseous findings. IMPRESSION: 1. No acute noncontrast CT findings of the abdomen or pelvis to explain abdominal pain. 2. Small gallstones and sludge in the gallbladder without evidence of acute cholecystitis. 3. Sigmoid diverticulosis without evidence of acute diverticulitis. Aortic Atherosclerosis (ICD10-I70.0). Electronically Signed   By: Eddie Candle M.D.   On: 01/07/2021 18:46   CT Angio Chest PE W and/or Wo Contrast  Result Date: 01/06/2021 CLINICAL DATA:  Chest pain and shortness of breath today. EXAM: CT ANGIOGRAPHY CHEST WITH CONTRAST TECHNIQUE: Multidetector CT imaging of the chest was performed using the standard protocol during bolus administration of intravenous contrast. Multiplanar CT image reconstructions and MIPs were obtained to evaluate the vascular anatomy. CONTRAST:  58m OMNIPAQUE IOHEXOL 350 MG/ML SOLN COMPARISON:  None. FINDINGS: Cardiovascular: The heart is within normal limits in size. No pericardial effusion. There is fusiform aneurysmal dilatation of the ascending thoracic aorta with maximum measurement of 4.4 cm. Scattered calcifications at the aortic arch. Scattered coronary artery calcifications. The pulmonary arterial tree is well opacified. No filling defects to suggest pulmonary embolism. Mediastinum/Nodes: No mediastinal or hilar mass or adenopathy. The esophagus  is unremarkable. Lungs/Pleura: No acute pulmonary findings. No infiltrates or effusions. No pulmonary edema. No worrisome pulmonary lesions. Mild dependent atelectasis is noted. Upper Abdomen: No significant upper abdominal findings. Small scattered hepatic cysts are noted. Musculoskeletal: No chest wall mass, supraclavicular or axillary adenopathy. The bony thorax is intact.  No worrisome bone lesions. Review of the MIP images confirms the above findings. IMPRESSION: 1. No CT findings for pulmonary embolism. 2. 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 Recommend semi-annual imaging followup by CTA or MRA and referral to cardiothoracic surgery if not already obtained. 2010; 121: [id number]. 3. No acute pulmonary findings. 4. Aortic atherosclerosis. Aortic Atherosclerosis (ICD10-I70.0). Electronically Signed   By: PMarijo SanesM.D.   On: 01/06/2021 16:34   CT CORONARY MORPH W/CTA COR W/SCORE W/CA W/CM &/OR WO/CM  Addendum Date: 01/08/2021   ADDENDUM REPORT: 01/08/2021 08:07 EXAM: OVER-READ INTERPRETATION  CT CHEST The following report is an over-read performed by radiologist Dr. [facility] on 01/08/2021. This over-read does not include interpretation of cardiac or coronary anatomy or pathology. The coronary calcium score and cardiac CTA interpretation by the cardiologist is attached. COMPARISON:  None. FINDINGS: Aortic atherosclerosis. Within the visualized portions of the thorax there are no suspicious appearing pulmonary nodules or masses, there is no acute consolidative airspace disease, no pleural effusions, no pneumothorax and no lymphadenopathy. Visualized portions of the upper abdomen are unremarkable. There are no aggressive appearing lytic or blastic lesions noted  in the visualized portions of the skeleton. IMPRESSION: 1.  Aortic Atherosclerosis (ICD10-I70.0). Electronically Signed   By: Vinnie Langton M.D.   On:  01/08/2021 08:07   Result Date: 01/08/2021 CLINICAL DATA:  70 Year-old White Male EXAM: Cardiac/Coronary  CTA TECHNIQUE: The patient was scanned on a [employer]. FINDINGS: A 100 kV prospective scan was triggered in the descending thoracic aorta at 111 HU's. Axial non-contrast 3 mm slices were carried out through the heart. The data set was analyzed on a dedicated work station and scored using the [place]. Gantry rotation speed was 250 msecs and collimation was .6 mm. No beta blockade and 0.8 mg of sl NTG was given. The 3D data set was reconstructed in 5% intervals of the 67-82 % of the R-R cycle. Diastolic phases were analyzed on a dedicated work station using MPR, MIP and VRT modes. The patient received 80 cc of contrast. Aorta: Upper limit of normal: 38 mm. Aortic Atherosclerosis noted. No dissection. Aortic Valve:  Tri-leaflet.  Annular calcification noted. Coronary Arteries:  Normal coronary origin.  Right dominance. Coronary calcium score of 36. This was 29th percentile for age, sex, and race matched control. RCA is a large dominant artery that gives rise to PDA and PLA. There is no plaque. Left main is a large artery that gives rise to LAD and LCX arteries. There is no plaque. LAD is a large vessel that gives off a D1 vessel. There is a minimal, non-obstructive calcified plaque (1-24%) in the proximal LAD. LCX is a non-dominant artery that gives rise to two small OM branches. There is no plaque. Other findings: Normal pulmonary vein drainage into the left atrium. Normal left atrial appendage without a thrombus. Normal size of the pulmonary artery. Extra-cardiac findings: See attached radiology report for non-cardiac structures. IMPRESSION: 1. Coronary calcium score of 36. This was 29th percentile for age, sex, and race matched control. 2. Normal coronary origin with right dominance. 3. Aortic Atherosclerosis noted. 4. CAD-RADS 1. 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  Consider non-atherosclerotic causes of chest pain. Consider preventive therapy and risk factor modification. Electronically Signed: By: Rudean Haskell MD On: 01/07/2021 14:30   DG Chest Port 1 View  Result Date: 01/06/2021 CLINICAL DATA:  Shortness of breath EXAM: PORTABLE CHEST 1 VIEW COMPARISON:  12/21/2020 FINDINGS: The heart size and mediastinal contours are within normal limits. Both lungs are clear. Status post bilateral reverse shoulder arthroplasty. IMPRESSION: No acute abnormality of the lungs in AP portable projection. Electronically Signed   By: Eddie Candle M.D.   On: 01/06/2021 14:04   ECHOCARDIOGRAM COMPLETE  Result Date: 01/07/2021    ECHOCARDIOGRAM REPORT   Patient Name:   DARIYON URQUILLA Date of Exam: 01/07/2021 Medical Rec #:  [id number]          Height:       70.0 in Accession #:    [id number]         Weight:       221.7 lb Date of Birth:  1951-06-16          BSA:          2.181 m Patient Age:    6 years           BP:           118/85 mmHg Patient Gender: M                  HR:  58 bpm. Exam Location:  Inpatient Procedure: 2D Echo, Color Doppler and Cardiac Doppler Indications:    Acute Ischemic Heart Disease i24.9  History:        Patient has prior history of Echocardiogram examinations, most                 recent 01/29/2018. Arrythmias:Atrial Fibrillation; Risk                 Factors:Hypertension and Dyslipidemia.  Sonographer:    Raquel Sarna Senior RDCS Referring Phys: [id number] [place]  1. Left ventricular ejection fraction, by estimation, is 50 to 55%. The left ventricle has low normal function. The left ventricle has no regional wall motion abnormalities. Left ventricular diastolic parameters were normal.  2. Right ventricular systolic function is normal. The right ventricular size is normal.  3. The mitral valve is grossly normal. No evidence of mitral valve regurgitation.  4. The aortic valve is tricuspid. There is mild calcification of the aortic  valve. There is mild thickening of the aortic valve. Aortic valve regurgitation is not visualized. No aortic stenosis is present.  5. Aortic dilatation noted. There is mild to moderate dilatation of the aortic root, measuring 44 mm. There is borderline dilatation of the ascending aorta, measuring 37 mm. There is mild dilatation of the transverse aorta, measuring 37 mm. Comparison(s): A prior study was performed on 01/29/2018. Prior images reviewed side by side. Slight decrease in LV function, aortic dilation noted. FINDINGS  Left Ventricle: Left ventricular ejection fraction, by estimation, is 50 to 55%. The left ventricle has low normal function. The left ventricle has no regional wall motion abnormalities. The left ventricular internal cavity size was small. There is no left ventricular hypertrophy. Left ventricular diastolic parameters were normal. Right Ventricle: The right ventricular size is normal. No increase in right ventricular wall thickness. Right ventricular systolic function is normal. Left Atrium: Left atrial size was normal in size. Right Atrium: Right atrial size was normal in size. Normal Area- Volume is discordant with visual estimates. Pericardium: Trivial pericardial effusion is present. Mitral Valve: The mitral valve is grossly normal. No evidence of mitral valve regurgitation. Tricuspid Valve: The tricuspid valve is not well visualized. Tricuspid valve regurgitation is not demonstrated. Aortic Valve: The aortic valve is tricuspid. There is mild calcification of the aortic valve. There is mild thickening of the aortic valve. There is mild aortic valve annular calcification. Aortic valve regurgitation is not visualized. No aortic stenosis  is present. Pulmonic Valve: The pulmonic valve was not well visualized. Pulmonic valve regurgitation is not visualized. No evidence of pulmonic stenosis. Aorta: Aortic dilatation noted. There is mild to moderate dilatation of the aortic root, measuring 44 mm.  There is borderline dilatation of the ascending aorta, measuring 37 mm. There is mild dilatation of the transverse aorta, measuring 37 mm. IAS/Shunts: The atrial septum is grossly normal.  LEFT VENTRICLE PLAX 2D LVIDd:         3.80 cm  Diastology LVIDs:         2.70 cm  LV e' medial:    8.92 cm/s LV PW:         1.30 cm  LV E/e' medial:  6.3 LV IVS:        1.60 cm  LV e' lateral:   11.00 cm/s LVOT diam:     2.40 cm  LV E/e' lateral: 5.1 LV SV:         81 LV SV Index:   37  LVOT Area:     4.52 cm  RIGHT VENTRICLE RV S prime:     10.70 cm/s TAPSE (M-mode): 2.4 cm LEFT ATRIUM           Index       RIGHT ATRIUM           Index LA diam:      3.60 cm 1.65 cm/m  RA Area:     38.70 cm LA Vol (A4C): 52.6 ml 24.12 ml/m RA Volume:   162.00 ml 74.29 ml/m  AORTIC VALVE LVOT Vmax:   87.50 cm/s LVOT Vmean:  65.900 cm/s LVOT VTI:    0.178 m  AORTA Ao Root diam: 4.40 cm Ao Asc diam:  3.70 cm MITRAL VALVE MV Area (PHT): 2.42 cm    SHUNTS MV Decel Time: 313 msec    Systemic VTI:  0.18 m MV E velocity: 56.60 cm/s  Systemic Diam: 2.40 cm MV A velocity: 25.30 cm/s MV E/A ratio:  2.24 Rudean Haskell MD Electronically signed by Rudean Haskell MD Signature Date/Time: 01/07/2021/12:53:44 PM    Final      IMPRESSION:   *   Upper abdominal/epigastric pain.  Pattern somewhat colicky in nature. Uncomplicated gallbladder stones and sludge on CT scan, but he did have mildly elevated LFTs a little more than 2 weeks ago.  These have not been rechecked.  Was drinking a lot more than usual during vacation this 2/4 -2/11 but the acute GI pain did not start until after a Caesar salad yesterday. This also could be ulcer or reflux related.  *   Chest pain, not felt to be cardiac in nature.  *   Recent diagnosis of A. fib, currently in NSR.  Cardiology would like to restart his Eliquis.   PLAN:     *   Repeat the LFTs as they have not been checked since he was in the ED on 1/27. ? Pursue ultrasound or HIDA scan?     Azucena Freed  01/08/2021, 12:22 PM Phone [phone]     Attending Physician Note   I have taken a history, examined the patient and reviewed the chart. I agree with the Advanced Practitioner's note, impression and recommendations.  Epigastric, upper abdominal pain with colicky features. Noncardiac chest pain. R/O symptomatic cholelithiasis, cholecystitis, GERD, ulcer, gastritis.   Proceed with RUQ [place], LFTs, lipase and pending findings consider HIDA and EGD  Pantoprazole 40 mg po bid for now  Mylicon for bloating MOM for mild constipation   Lucio Edward, MD FACG [phone]

## 2021-01-08 NOTE — Progress Notes (Signed)
ANTICOAGULATION CONSULT NOTE  Pharmacy Consult for heparin Indication: Afib  Allergies  Allergen Reactions  . Dust Mite Extract Other (See Comments)    Nasal Congestion  . Drug Ingredient [Bee Pollen] Other (See Comments)    Congestion  . Other Other (See Comments)    Pet Dander- Runny nose    Patient Measurements: Height: 5\' 10"  (177.8 cm) Weight: 100.6 kg (221 lb 11.2 oz) IBW/kg (Calculated) : 73 Heparin Dosing Weight: 94.5 kg  Vital Signs: Temp: 98.2 F (36.8 C) (02/14 1758) Temp Source: Oral (02/14 1758) BP: 141/79 (02/14 1758) Pulse Rate: 54 (02/14 1758)  Labs: Recent Labs    01/06/21 1302 01/06/21 1737 01/06/21 2013 01/07/21 0457 01/07/21 0457 01/07/21 1443 01/07/21 2311 01/08/21 1248 01/08/21 2045  HGB 14.1  --   --  14.2  --   --   --   --   --   HCT 42.3  --   --  40.0  --   --   --   --   --   PLT 164  --   --  143*  --   --   --   --   --   APTT  --   --   --  49*   < > 47* 51* 48* 73*  HEPARINUNFRC  --   --   --  1.07*   < > 0.48 0.45 0.34  --   CREATININE 1.10  --   --  1.02  --   --   --   --   --   TROPONINIHS  --  15 13  --   --   --   --   --   --    < > = values in this interval not displayed.    Estimated Creatinine Clearance: 80.1 mL/min (by C-G formula based on SCr of 1.02 mg/dL).   Assessment: 70 y.o. male with h/o Afib, Elqius on hold for possible procedures, pharmacy dosing heparin.    - APTT is therapeutic for goal at 73 after small bolus and increase to 1750 units/hour. No reported bleeding.  Goal of Therapy:  Heparin level 0.3-0.7 units/ml aPTT 66-102 seconds Monitor platelets by anticoagulation protocol: Yes   Plan:  Continue Heparin infusion at 1750 units/hour Follow -up AM aPTT and heparin level.  Monitor CBC   66, PharmD, BCPS, BCCCP Clinical Pharmacist Please refer to [facility] for [facility] numbers 01/08/2021, 9:50 PM

## 2021-01-09 DIAGNOSIS — R7989 Other specified abnormal findings of blood chemistry: Secondary | ICD-10-CM

## 2021-01-09 DIAGNOSIS — I200 Unstable angina: Secondary | ICD-10-CM | POA: Diagnosis not present

## 2021-01-09 DIAGNOSIS — I4891 Unspecified atrial fibrillation: Secondary | ICD-10-CM

## 2021-01-09 DIAGNOSIS — K851 Biliary acute pancreatitis without necrosis or infection: Secondary | ICD-10-CM

## 2021-01-09 LAB — COMPREHENSIVE METABOLIC PANEL
ALT: 440 U/L — ABNORMAL HIGH (ref 0–44)
AST: 248 U/L — ABNORMAL HIGH (ref 15–41)
Albumin: 2.9 g/dL — ABNORMAL LOW (ref 3.5–5.0)
Alkaline Phosphatase: 176 U/L — ABNORMAL HIGH (ref 38–126)
Anion gap: 10 (ref 5–15)
BUN: 11 mg/dL (ref 8–23)
CO2: 25 mmol/L (ref 22–32)
Calcium: 8.8 mg/dL — ABNORMAL LOW (ref 8.9–10.3)
Chloride: 103 mmol/L (ref 98–111)
Creatinine, Ser: 1.02 mg/dL (ref 0.61–1.24)
GFR, Estimated: >60 mL/min (ref >60)
Glucose, Bld: 98 mg/dL (ref 70–99)
Potassium: 3.8 mmol/L (ref 3.5–5.1)
Sodium: 138 mmol/L (ref 135–145)
Total Bilirubin: 6 mg/dL — ABNORMAL HIGH (ref 0.3–1.2)
Total Protein: 5.7 g/dL — ABNORMAL LOW (ref 6.5–8.1)

## 2021-01-09 LAB — LIPASE, BLOOD: Lipase: 353 U/L — ABNORMAL HIGH (ref 11–51)

## 2021-01-09 LAB — CBC
HCT: 37.8 % — ABNORMAL LOW (ref 39.0–52.0)
Hemoglobin: 12.6 g/dL — ABNORMAL LOW (ref 13.0–17.0)
MCH: 30.8 pg (ref 26.0–34.0)
MCHC: 33.3 g/dL (ref 30.0–36.0)
MCV: 92.4 fL (ref 80.0–100.0)
Platelets: 134 10*3/uL — ABNORMAL LOW (ref 150–400)
RBC: 4.09 MIL/uL — ABNORMAL LOW (ref 4.22–5.81)
RDW: 12.7 % (ref 11.5–15.5)
WBC: 6 10*3/uL (ref 4.0–10.5)
nRBC: 0 % (ref 0.0–0.2)

## 2021-01-09 LAB — APTT: aPTT: 73 seconds — ABNORMAL HIGH (ref 24–36)

## 2021-01-09 LAB — HEPARIN LEVEL (UNFRACTIONATED): Heparin Unfractionated: 0.37 IU/mL (ref 0.30–0.70)

## 2021-01-09 MED ORDER — HEPARIN (PORCINE) 25000 UT/250ML-% IV SOLN
1750.0000 [IU]/h | INTRAVENOUS | Status: AC
  Administered 2021-01-09 (×2): 1750 [IU]/h via INTRAVENOUS
  Filled 2021-01-09 (×3): qty 250

## 2021-01-09 MED ORDER — CIPROFLOXACIN IN D5W 400 MG/200ML IV SOLN
400.0000 mg | INTRAVENOUS | Status: AC
  Filled 2021-01-09: qty 200

## 2021-01-09 MED ORDER — INDOMETHACIN 50 MG RE SUPP
100.0000 mg | Freq: Once | RECTAL | Status: DC
  Filled 2021-01-09: qty 2

## 2021-01-09 MED ORDER — SODIUM CHLORIDE 0.9 % IV SOLN: INTRAVENOUS | Status: DC

## 2021-01-09 NOTE — Progress Notes (Signed)
Patient came in with chest pain was concerned about angina and admitted under cardiology service and has been on ACS meds.  Last night patient developed acute abd pain, diagnosed with gall stone pancreatitis, both GI and surgeon on board.  Pickup for tomorrow to transfer to hospitalist service, plan for ERCP tomorrow and cholecystectomy Thursday. No S/S of Cholangitis as of now.

## 2021-01-09 NOTE — H&P (View-Only) (Signed)
  Progress Note   Subjective  Recurrent upper abdominal pain last night now has abated.    Objective  Vital signs in last 24 hours: Temp:  [97.9 F (36.6 C)-98.3 F (36.8 C)] 97.9 F (36.6 C) (02/15 0437) Pulse Rate:  [54-64] 64 (02/15 0437) Resp:  [16-17] 17 (02/15 0437) BP: (128-141)/(72-79) 128/77 (02/15 0437) SpO2:  [96 %-100 %] 100 % (02/15 0437) Last BM Date: 01/08/21  General: Alert, well-developed, in NAD Heart:  Regular rate and rhythm; no murmurs Chest: Clear to ascultation bilaterally Abdomen:  Soft, mild upper abdominal tenderness and nondistended. Normal bowel sounds, without guarding, and without rebound.   Extremities:  Without edema. Neurologic:  Alert and  oriented x4; grossly normal neurologically. Psych:  Alert and cooperative. Normal mood and affect.  Intake/Output from previous day: 02/14 0701 - 02/15 0700 In: -  Out: 700 [Urine:700] Intake/Output this shift: No intake/output data recorded.  Lab Results: Recent Labs    01/06/21 1302 01/07/21 0457  WBC 8.9 4.1  HGB 14.1 14.2  HCT 42.3 40.0  PLT 164 143*   BMET Recent Labs    01/06/21 1302 01/07/21 0457 01/09/21 0143  NA 139 139 138  K 3.9 4.1 3.8  CL 104 105 103  CO2 22 25 25  GLUCOSE 164* 125* 98  BUN 23 16 11  CREATININE 1.10 1.02 1.02  CALCIUM 9.1 8.9 8.8*   LFT Recent Labs    01/08/21 1300 01/09/21 0143  PROT 6.0* 5.7*  ALBUMIN 3.2* 2.9*  AST 322* 248*  ALT 492* 440*  ALKPHOS 155* 176*  BILITOT 5.6* 6.0*  BILIDIR 3.3*  --   IBILI 2.3*  --    PT/INR No results for input(s): LABPROT, INR in the last 72 hours. Hepatitis Panel No results for input(s): HEPBSAG, HCVAB, HEPAIGM, HEPBIGM in the last 72 hours.  Studies/Results: CT ABDOMEN PELVIS WO CONTRAST  Result Date: 01/07/2021 CLINICAL DATA:  Abdominal pain, cardiac catheterization today EXAM: CT ABDOMEN AND PELVIS WITHOUT CONTRAST TECHNIQUE: Multidetector CT imaging of the abdomen and pelvis was performed  following the standard protocol without IV contrast. Oral enteric contrast was administered. COMPARISON:  None. FINDINGS: Lower chest: No acute abnormality. Hepatobiliary: Fluid attenuation lesions of the left and right lobes of the liver, incompletely characterized on this noncontrast examination although likely cysts or hemangiomata. Small gallstones and sludge in the gallbladder. Gallbladder wall thickening, or biliary dilatation. Pancreas: Unremarkable. No pancreatic ductal dilatation or surrounding inflammatory changes. Spleen: Normal in size without significant abnormality. Adrenals/Urinary Tract: Adrenal glands are unremarkable. Kidneys are normal, without renal calculi, solid lesion, or hydronephrosis. Excreted contrast in the urinary bladder. Stomach/Bowel: Stomach is within normal limits. Appendix appears normal. No evidence of bowel wall thickening, distention, or inflammatory changes. Sigmoid diverticulosis. Vascular/Lymphatic: Aortic atherosclerosis. No enlarged abdominal or pelvic lymph nodes. Reproductive: No mass or other significant abnormality. Other: Status post bilateral inguinal hernia repair. No abdominopelvic ascites. Musculoskeletal: No acute or significant osseous findings. IMPRESSION: 1. No acute noncontrast CT findings of the abdomen or pelvis to explain abdominal pain. 2. Small gallstones and sludge in the gallbladder without evidence of acute cholecystitis. 3. Sigmoid diverticulosis without evidence of acute diverticulitis. Aortic Atherosclerosis (ICD10-I70.0). Electronically Signed   By: Alex  Bibbey M.D.   On: 01/07/2021 18:46   US Abdomen Limited  Result Date: 01/08/2021 CLINICAL DATA:  Acute onset abdominal pain EXAM: ULTRASOUND ABDOMEN LIMITED RIGHT UPPER QUADRANT COMPARISON:  CT 01/07/2021 FINDINGS: Gallbladder: Moderate sludge in the gallbladder. Shadowing stones measuring up to   7 mm. Negative sonographic Murphy. Increased wall thickness at 5.3 mm. Common bile duct: Diameter:  8.7 mm Liver: No focal lesion identified. Within normal limits in parenchymal echogenicity. Portal vein is patent on color Doppler imaging with normal direction of blood flow towards the liver. Hypodense lesions on CT are not well seen by sonography. Other: None. IMPRESSION: 1. Moderate sludge in the gallbladder with small stones. Increased wall thickness but negative sonographic Eulah Pont, findings are indeterminate for cholecystitis. Consider correlation with nuclear medicine hepatobiliary imaging. 2. Slightly enlarged common bile duct. Recommend correlation with LFTs, further evaluation with MRCP may be obtained if clinically indicated Electronically Signed   By: Jasmine Pang M.D.   On: 01/08/2021 20:53   CT CORONARY MORPH W/CTA COR W/SCORE W/CA W/CM &/OR WO/CM  Addendum Date: 01/08/2021   ADDENDUM REPORT: 01/08/2021 08:07 EXAM: OVER-READ INTERPRETATION  CT CHEST The following report is an over-read performed by radiologist Dr. Royal Piedra [facility] on 01/08/2021. This over-read does not include interpretation of cardiac or coronary anatomy or pathology. The coronary calcium score and cardiac CTA interpretation by the cardiologist is attached. COMPARISON:  None. FINDINGS: Aortic atherosclerosis. Within the visualized portions of the thorax there are no suspicious appearing pulmonary nodules or masses, there is no acute consolidative airspace disease, no pleural effusions, no pneumothorax and no lymphadenopathy. Visualized portions of the upper abdomen are unremarkable. There are no aggressive appearing lytic or blastic lesions noted in the visualized portions of the skeleton. IMPRESSION: 1.  Aortic Atherosclerosis (ICD10-I70.0). Electronically Signed   By: Trudie Reed M.D.   On: 01/08/2021 08:07   Result Date: 01/08/2021 CLINICAL DATA:  70 Year-old White Male EXAM: Cardiac/Coronary  CTA TECHNIQUE: The patient was scanned on a [employer]. FINDINGS: A 100 kV prospective scan  was triggered in the descending thoracic aorta at 111 HU's. Axial non-contrast 3 mm slices were carried out through the heart. The data set was analyzed on a dedicated work station and scored using the Agatson method. Gantry rotation speed was 250 msecs and collimation was .6 mm. No beta blockade and 0.8 mg of sl NTG was given. The 3D data set was reconstructed in 5% intervals of the 67-82 % of the R-R cycle. Diastolic phases were analyzed on a dedicated work station using MPR, MIP and VRT modes. The patient received 80 cc of contrast. Aorta: Upper limit of normal: 38 mm. Aortic Atherosclerosis noted. No dissection. Aortic Valve:  Tri-leaflet.  Annular calcification noted. Coronary Arteries:  Normal coronary origin.  Right dominance. Coronary calcium score of 36. This was 29th percentile for age, sex, and race matched control. RCA is a large dominant artery that gives rise to PDA and PLA. There is no plaque. Left main is a large artery that gives rise to LAD and LCX arteries. There is no plaque. LAD is a large vessel that gives off a D1 vessel. There is a minimal, non-obstructive calcified plaque (1-24%) in the proximal LAD. LCX is a non-dominant artery that gives rise to two small OM branches. There is no plaque. Other findings: Normal pulmonary vein drainage into the left atrium. Normal left atrial appendage without a thrombus. Normal size of the pulmonary artery. Extra-cardiac findings: See attached radiology report for non-cardiac structures. IMPRESSION: 1. Coronary calcium score of 36. This was 29th percentile for age, sex, and race matched control. 2. Normal coronary origin with right dominance. 3. Aortic Atherosclerosis noted. 4. CAD-RADS 1. 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 Consider non-atherosclerotic causes  of chest pain. Consider preventive therapy and risk factor modification. Electronically Signed: By: Riley Lam MD On: 01/07/2021 14:30   ECHOCARDIOGRAM  COMPLETE  Result Date: 01/07/2021    ECHOCARDIOGRAM REPORT   Patient Name:   Todd Barrett Date of Exam: 01/07/2021 Medical Rec #:  [id number]          Height:       70.0 in Accession #:    [id number]         Weight:       221.7 lb Date of Birth:  1951-04-02          BSA:          2.181 m Patient Age:    69 years           BP:           118/85 mmHg Patient Gender: M                  HR:           58 bpm. Exam Location:  Inpatient Procedure: 2D Echo, Color Doppler and Cardiac Doppler Indications:    Acute Ischemic Heart Disease i24.9  History:        Patient has prior history of Echocardiogram examinations, most                 recent 01/29/2018. Arrythmias:Atrial Fibrillation; Risk                 Factors:Hypertension and Dyslipidemia.  Sonographer:    Irving Burton Senior RDCS Referring Phys: [id number] MATTHEW A CARLISLE IMPRESSIONS  1. Left ventricular ejection fraction, by estimation, is 50 to 55%. The left ventricle has low normal function. The left ventricle has no regional wall motion abnormalities. Left ventricular diastolic parameters were normal.  2. Right ventricular systolic function is normal. The right ventricular size is normal.  3. The mitral valve is grossly normal. No evidence of mitral valve regurgitation.  4. The aortic valve is tricuspid. There is mild calcification of the aortic valve. There is mild thickening of the aortic valve. Aortic valve regurgitation is not visualized. No aortic stenosis is present.  5. Aortic dilatation noted. There is mild to moderate dilatation of the aortic root, measuring 44 mm. There is borderline dilatation of the ascending aorta, measuring 37 mm. There is mild dilatation of the transverse aorta, measuring 37 mm. Comparison(s): A prior study was performed on 01/29/2018. Prior images reviewed side by side. Slight decrease in LV function, aortic dilation noted. FINDINGS  Left Ventricle: Left ventricular ejection fraction, by estimation, is 50 to 55%. The left ventricle has  low normal function. The left ventricle has no regional wall motion abnormalities. The left ventricular internal cavity size was small. There is no left ventricular hypertrophy. Left ventricular diastolic parameters were normal. Right Ventricle: The right ventricular size is normal. No increase in right ventricular wall thickness. Right ventricular systolic function is normal. Left Atrium: Left atrial size was normal in size. Right Atrium: Right atrial size was normal in size. Normal Area- Volume is discordant with visual estimates. Pericardium: Trivial pericardial effusion is present. Mitral Valve: The mitral valve is grossly normal. No evidence of mitral valve regurgitation. Tricuspid Valve: The tricuspid valve is not well visualized. Tricuspid valve regurgitation is not demonstrated. Aortic Valve: The aortic valve is tricuspid. There is mild calcification of the aortic valve. There is mild thickening of the aortic valve. There is mild aortic valve annular calcification. Aortic valve regurgitation  is not visualized. No aortic stenosis  is present. Pulmonic Valve: The pulmonic valve was not well visualized. Pulmonic valve regurgitation is not visualized. No evidence of pulmonic stenosis. Aorta: Aortic dilatation noted. There is mild to moderate dilatation of the aortic root, measuring 44 mm. There is borderline dilatation of the ascending aorta, measuring 37 mm. There is mild dilatation of the transverse aorta, measuring 37 mm. IAS/Shunts: The atrial septum is grossly normal.  LEFT VENTRICLE PLAX 2D LVIDd:         3.80 cm  Diastology LVIDs:         2.70 cm  LV e' medial:    8.92 cm/s LV PW:         1.30 cm  LV E/e' medial:  6.3 LV IVS:        1.60 cm  LV e' lateral:   11.00 cm/s LVOT diam:     2.40 cm  LV E/e' lateral: 5.1 LV SV:         81 LV SV Index:   37 LVOT Area:     4.52 cm  RIGHT VENTRICLE RV S prime:     10.70 cm/s TAPSE (M-mode): 2.4 cm LEFT ATRIUM           Index       RIGHT ATRIUM           Index LA  diam:      3.60 cm 1.65 cm/m  RA Area:     38.70 cm LA Vol (A4C): 52.6 ml 24.12 ml/m RA Volume:   162.00 ml 74.29 ml/m  AORTIC VALVE LVOT Vmax:   87.50 cm/s LVOT Vmean:  65.900 cm/s LVOT VTI:    0.178 m  AORTA Ao Root diam: 4.40 cm Ao Asc diam:  3.70 cm MITRAL VALVE MV Area (PHT): 2.42 cm    SHUNTS MV Decel Time: 313 msec    Systemic VTI:  0.18 m MV E velocity: 56.60 cm/s  Systemic Diam: 2.40 cm MV A velocity: 25.30 cm/s MV E/A ratio:  2.24 Riley Lam MD Electronically signed by Riley Lam MD Signature Date/Time: 01/07/2021/12:53:44 PM    Final       Assessment & Recommendations   1. Cholelithiasis, possible cholecystitis - increased GB wall thickness, elevated LFTs, mildly dilated CBD, mild biliary pancreatitis. NPO today. Trend LFTs, lipase. IVF. ERCP tomorrow at 0900. Consult General Surgery today.   2. Afib currently on IV heparin, on Eliquis as outpatient. Continue to hold Eliquis. Hold Heparin 6 hours prior to ERCP at 0300.     LOS: 3 days   Judie Petit T. Russella Dar MD 01/09/2021, 8:28 AM [phone]

## 2021-01-09 NOTE — Progress Notes (Signed)
ANTICOAGULATION CONSULT NOTE  Pharmacy Consult for heparin Indication: Afib  Allergies  Allergen Reactions  . Dust Mite Extract Other (See Comments)    Nasal Congestion  . Drug Ingredient [Bee Pollen] Other (See Comments)    Congestion  . Other Other (See Comments)    Pet Dander- Runny nose    Patient Measurements: Height: 5\' 10"  (177.8 cm) Weight: 100.6 kg (221 lb 11.2 oz) IBW/kg (Calculated) : 73 Heparin Dosing Weight: 94.5 kg  Vital Signs: Temp: 97.9 F (36.6 C) (02/15 0437) Temp Source: Oral (02/15 0437) BP: 128/77 (02/15 0437) Pulse Rate: 64 (02/15 0437)  Labs: Recent Labs    01/06/21 1302 01/06/21 1737 01/06/21 2013 01/07/21 0457 01/07/21 1443 01/07/21 2311 01/08/21 1248 01/08/21 2045 01/09/21 0143  HGB 14.1  --   --  14.2  --   --   --   --   --   HCT 42.3  --   --  40.0  --   --   --   --   --   PLT 164  --   --  143*  --   --   --   --   --   APTT  --   --   --  49*   < > 51* 48* 73* 73*  HEPARINUNFRC  --   --   --  1.07*   < > 0.45 0.34  --  0.37  CREATININE 1.10  --   --  1.02  --   --   --   --  1.02  TROPONINIHS  --  15 13  --   --   --   --   --   --    < > = values in this interval not displayed.    Estimated Creatinine Clearance: 80.1 mL/min (by C-G formula based on SCr of 1.02 mg/dL).   Assessment: 70 y.o. male with h/o Afib, Elqius on hold for possible procedures, pharmacy dosing heparin. Plans noted for possible EGD.  - APTT is therapeutic for goal at 73, heparin level at goal and appears to be correlating  Goal of Therapy:  Heparin level 0.3-0.7 units/ml aPTT 66-102 seconds Monitor platelets by anticoagulation protocol: Yes   Plan:  Continue Heparin infusion at 1750 units/hour Daily heparin level and CBC  66, PharmD Clinical Pharmacist **Pharmacist phone directory can now be found on [email or web address] ([email or web address]).  Listed under [facility].

## 2021-01-09 NOTE — Anesthesia Preprocedure Evaluation (Addendum)
Anesthesia Evaluation  Patient identified by MRN, date of birth, ID band Patient awake    Reviewed: Allergy & Precautions, H&P , NPO status , Patient's Chart, lab work & pertinent test results  Airway Mallampati: II  TM Distance: >3 FB Neck ROM: Full    Dental no notable dental hx. (+) Teeth Intact, Dental Advisory Given   Pulmonary neg pulmonary ROS,    Pulmonary exam normal breath sounds clear to auscultation       Cardiovascular Exercise Tolerance: Good hypertension, + dysrhythmias Atrial Fibrillation  Rhythm:Regular Rate:Normal     Neuro/Psych Anxiety negative neurological ROS     GI/Hepatic Neg liver ROS, GERD  ,  Endo/Other  negative endocrine ROS  Renal/GU negative Renal ROS  negative genitourinary   Musculoskeletal  (+) Arthritis ,   Abdominal   Peds  Hematology  (+) Blood dyscrasia, anemia ,   Anesthesia Other Findings   Reproductive/Obstetrics negative OB ROS                            Anesthesia Physical Anesthesia Plan  ASA: III  Anesthesia Plan: General   Post-op Pain Management:    Induction: Intravenous  PONV Risk Score and Plan: 3 and Ondansetron, Dexamethasone and Midazolam  Airway Management Planned: Oral ETT  Additional Equipment:   Intra-op Plan:   Post-operative Plan: Extubation in OR  Informed Consent: I have reviewed the patients History and Physical, chart, labs and discussed the procedure including the risks, benefits and alternatives for the proposed anesthesia with the patient or authorized representative who has indicated his/her understanding and acceptance.     Dental advisory given  Plan Discussed with: CRNA  Anesthesia Plan Comments:        Anesthesia Quick Evaluation

## 2021-01-09 NOTE — Consult Note (Signed)
Consult Note  Todd Barrett. 15-Apr-1951  [id number].    Requesting MD: Dr. Lucio Edward  Chief Complaint/Reason for Consult: Gallstone pancreatitis with possible cholecystitis   HPI:  Patient is a 70 year old male who was just placed on eliquis 2-3 weeks ago when he presented to the ED secondary to epigastric/chest pain.  He was evaluated with no findings, but found to be in A fib and was started on eliquis.  He was evaluated by cardiology who did not find any cardiac issues.  He was discharged home.  He went on vacation and still had some intermittent epigastric discomfort.  He was drinking some alcoholic drinks while there.  He returned on Friday and Saturday had worsening pain again.  He denies any nausea or vomiting.  His pain became so severe that he presented to the ED again.  This time he was noted to have lipase of around 1000, TB of 5.6 and elevated LFTs, normal WBc of 4.1.  He was admitted and his eliquis was held.  GI was consulted who plans to proceed with an ERCP tomorrow.  His symptoms are improving to almost resolved at this point.  We have been asked to see him for evaluation for lap chole after ERCP.  PMH significant for HTN, HLD, A. Fib on eliquis (on hold), GERD, OA, Gout. Past abdominal surgery includes inguinal hernia at age 48. NKDA.  ROS: ROS: Please see HPI, otherwise all other systems have been reviewed and negative.  Family History  Problem Relation Age of Onset  . Bradycardia Father 31       Pacemaker  . Lymphoma Mother   . Alzheimer's disease Mother   . Pancreatic cancer Brother   . Colon cancer Neg Hx     Past Medical History:  Diagnosis Date  . ALLERGIC RHINITIS 01/08/2010  . Allergy    dust mites, pet dander  . ANEMIA-NOS 01/08/2010  . Atrial fibrillation with RVR ([place]) 01/19/2018  . CERVICAL RADICULOPATHY, RIGHT 01/08/2010  . CHEST PAIN 01/08/2010  . DIVERTICULOSIS, COLON 01/08/2010  . Dysrhythmia    post op   during PT a fib RVR  .  GERD (gastroesophageal reflux disease)    Zantac OTC  prn  . GOUT 01/08/2010   in past/ when has surgery  . HYPERLIPIDEMIA 01/08/2010  . Lumbar disc disease 08/05/2011  . Osteoarthritis of right knee    end stage  . OSTEOARTHRITIS, KNEES, 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    Past Surgical History:  Procedure Laterality Date  . ANTERIOR CERVICAL DECOMP/DISCECTOMY FUSION  07/23/2012   Procedure: ANTERIOR CERVICAL DECOMPRESSION/DISCECTOMY FUSION 2 LEVELS;  Surgeon: Otilio Connors, MD;  Location: [facility];  Service: Neurosurgery;  Laterality: N/A;  Cervical three-four, four-five, redo Cervical six-seven anterior cervical decompression, discectomy fusion with allograft and plating  . BACK SURGERY  03/2004, 02/2005  . CERVICAL SPINE SURGERY  05/2010  . clavicle facture  nov. 2009   100% displacement  . COLONOSCOPY    . HERNIA REPAIR     inguinal hernia/right side/ 70 years old  . JOINT REPLACEMENT     Left knee 2006  . KNEE ARTHROSCOPY     total of 5  . left knee arthroscopy  september 2005  . lumbar disc surgury  02/2002  . lumbar disc surgury  june 2004  . NASAL RECONSTRUCTION     x 3  . REVERSE SHOULDER ARTHROPLASTY Left 07/30/2018   Procedure: LEFT REVERSE SHOULDER ARTHROPLASTY;  Surgeon: Onnie Graham,  Lennette Bihari, MD;  Location: [facility];  Service: Orthopedics;  Laterality: Left;  116mn  . s/p ENT surgury     x 3  . s/p left shoulder surgury  feb. 2009  . s/p left TKR  april 2006  . TOTAL KNEE ARTHROPLASTY Right 01/16/2018   Procedure: RIGHT TOTAL KNEE ARTHROPLASTY;  Surgeon: NNetta Cedars MD;  Location: [facility]  Service: Orthopedics;  Laterality: Right;  . TOTAL SHOULDER ARTHROPLASTY Right 09/16/2019   Procedure: Right Reverse shoulder arthroplasty;  Surgeon: SJustice Britain MD;  Location: [facility];  Service: Orthopedics;  Laterality: Right;    Social History:  reports that he has never smoked. He has never used smokeless tobacco. He reports current alcohol use of about 12.0 standard drinks of alcohol per week.  He reports that he does not use drugs.  Allergies:  Allergies  Allergen Reactions  . Dust Mite Extract Other (See Comments)    Nasal Congestion  . Drug Ingredient [Bee Pollen] Other (See Comments)    Congestion  . Other Other (See Comments)    Pet Dander- Runny nose    Medications Prior to Admission  Medication Sig Dispense Refill  . apixaban (ELIQUIS) 5 MG TABS tablet Take 1 tablet (5 mg total) by mouth 2 (two) times daily. 60 tablet 0  . atorvastatin (LIPITOR) 10 MG tablet Take 1 tablet (10 mg total) by mouth daily. 90 tablet 3  . Calcium Polycarbophil (FIBER-CAPS PO) Take 2 tablets by mouth daily.    . fexofenadine (ALLEGRA) 180 MG tablet Take 180 mg by mouth in the morning and at bedtime.    . fluticasone (FLONASE) 50 MCG/ACT nasal spray Place 2 sprays into the nose daily as needed for allergies. For allergies.    .Marland Kitchenlisinopril (ZESTRIL) 10 MG tablet Take 1 tablet (10 mg total) by mouth daily. 90 tablet 3  . naproxen (NAPROSYN) 500 MG tablet Take 1 tablet (500 mg total) by mouth 2 (two) times daily with a meal. 60 tablet 1  . Omega-3 Fatty Acids (FISH OIL) 1200 MG CPDR Take 3,600 mg by mouth daily.    .Marland KitchenVITAMIN D PO Take 2 tablets by mouth daily.    .Marland Kitchenamoxicillin (AMOXIL) 500 MG capsule Take 500 mg by mouth once as needed (pre-dental appointment/surgery).    . penicillin v potassium (VEETID) 500 MG tablet Take 500 mg by mouth once as needed (pre-dental appointment/surgery).      Blood pressure 128/77, pulse 64, temperature 97.9 F (36.6 C), temperature source Oral, resp. rate 17, height '5\' 10"'  (1.778 m), weight 100.6 kg, SpO2 100 %. Physical Exam:  General: pleasant, WD, MN white male who is laying in bed in NAD HEENT: head is normocephalic, atraumatic.  Sclera are noninjected.  PERRL.  Ears and nose without any masses or lesions.  Mouth is pink and moist Heart: regular rhythm, but brady.  Normal s1,s2. No obvious murmurs, gallops, or rubs noted.  Palpable radial and pedal pulses  bilaterally Lungs: CTAB, no wheezes, rhonchi, or rales noted.  Respiratory effort nonlabored Abd: soft, NT, ND, +BS, no masses, hernias, or organomegaly MS: all 4 extremities are symmetrical with no cyanosis, clubbing, or edema. Skin: warm and dry with no masses, lesions, or rashes Neuro: Cranial nerves 2-12 grossly intact, sensation is normal throughout Psych: A&Ox3 with an appropriate affect.   Results for orders placed or performed during the hospital encounter of 01/06/21 (from the past 48 hour(s))  Heparin level (unfractionated)     Status: None   Collection  Time: 01/07/21  2:43 PM  Result Value Ref Range   Heparin Unfractionated 0.48 0.30 - 0.70 IU/mL    Comment: (NOTE) If heparin results are below expected values, and patient dosage has  been confirmed, suggest follow up testing of antithrombin III levels. Performed at [facility], [place] [address], [place], [place] [address]   APTT     Status: Abnormal   Collection Time: 01/07/21  2:43 PM  Result Value Ref Range   aPTT 47 (H) 24 - 36 seconds    Comment:        IF BASELINE aPTT IS ELEVATED, SUGGEST PATIENT RISK ASSESSMENT BE USED TO DETERMINE APPROPRIATE ANTICOAGULANT THERAPY. Performed at [facility], [place] [address], [place], [place] [address]   APTT     Status: Abnormal   Collection Time: 01/07/21 11:11 PM  Result Value Ref Range   aPTT 51 (H) 24 - 36 seconds    Comment:        IF BASELINE aPTT IS ELEVATED, SUGGEST PATIENT RISK ASSESSMENT BE USED TO DETERMINE APPROPRIATE ANTICOAGULANT THERAPY. Performed at [facility], [place] [address] [place], [place] [address]   Heparin level (unfractionated)     Status: None   Collection Time: 01/07/21 11:11 PM  Result Value Ref Range   Heparin Unfractionated 0.45 0.30 - 0.70 IU/mL    Comment: (NOTE) If heparin results are below expected values, and patient dosage has  been confirmed, suggest follow up testing of antithrombin III levels. Performed at  [facility], [place] [address], [place], [place] [address]   APTT     Status: Abnormal   Collection Time: 01/08/21 12:48 PM  Result Value Ref Range   aPTT 48 (H) 24 - 36 seconds    Comment:        IF BASELINE aPTT IS ELEVATED, SUGGEST PATIENT RISK ASSESSMENT BE USED TO DETERMINE APPROPRIATE ANTICOAGULANT THERAPY. Performed at [facility], [place] [address], [place], [place] [address]   Heparin level (unfractionated)     Status: None   Collection Time: 01/08/21 12:48 PM  Result Value Ref Range   Heparin Unfractionated 0.34 0.30 - 0.70 IU/mL    Comment: (NOTE) If heparin results are below expected values, and patient dosage has  been confirmed, suggest follow up testing of antithrombin III levels. Performed at [facility], [place] [address] [place], [place] [address]   Hepatic function panel     Status: Abnormal   Collection Time: 01/08/21  1:00 PM  Result Value Ref Range   Total Protein 6.0 (L) 6.5 - 8.1 g/dL   Albumin 3.2 (L) 3.5 - 5.0 g/dL   AST 322 (H) 15 - 41 U/L   ALT 492 (H) 0 - 44 U/L   Alkaline Phosphatase 155 (H) 38 - 126 U/L   Total Bilirubin 5.6 (H) 0.3 - 1.2 mg/dL   Bilirubin, Direct 3.3 (H) 0.0 - 0.2 mg/dL   Indirect Bilirubin 2.3 (H) 0.3 - 0.9 mg/dL    Comment: Performed at [facility] [address], [place], [place] [address]  Lipase, blood     Status: Abnormal   Collection Time: 01/08/21  1:00 PM  Result Value Ref Range   Lipase 988 (H) 11 - 51 U/L    Comment: RESULTS CONFIRMED BY MANUAL DILUTION Performed at [facility], [place] [address] [place],  [address]   APTT     Status: Abnormal   Collection Time: 01/08/21  8:45 PM  Result Value Ref Range   aPTT 73 (H) 24 - 36 seconds    Comment:        IF BASELINE aPTT IS ELEVATED, SUGGEST PATIENT RISK ASSESSMENT BE USED TO DETERMINE APPROPRIATE ANTICOAGULANT THERAPY. Performed at [facility], [place] [address], [place], [place] [address]   Lipase, blood     Status:  Abnormal   Collection Time: 01/09/21  1:43 AM  Result Value Ref Range   Lipase 353 (H) 11 - 51 U/L    Comment: Performed at [facility], [place] [address], [place], [place] [address]  Comprehensive metabolic panel     Status: Abnormal   Collection Time: 01/09/21  1:43 AM  Result Value Ref Range   Sodium 138 135 - 145 mmol/L   Potassium 3.8 3.5 - 5.1 mmol/L   Chloride 103 98 - 111 mmol/L   CO2 25 22 - 32 mmol/L   Glucose, Bld 98 70 - 99 mg/dL    Comment: Glucose reference range applies only to samples taken after fasting for at least 8 hours.   BUN 11 8 - 23 mg/dL   Creatinine, Ser 1.02 0.61 - 1.24 mg/dL   Calcium 8.8 (L) 8.9 - 10.3 mg/dL   Total Protein 5.7 (L) 6.5 - 8.1 g/dL   Albumin 2.9 (L) 3.5 - 5.0 g/dL   AST 248 (H) 15 - 41 U/L   ALT 440 (H) 0 - 44 U/L   Alkaline Phosphatase 176 (H) 38 - 126 U/L   Total Bilirubin 6.0 (H) 0.3 - 1.2 mg/dL   GFR, Estimated >60 >60 mL/min    Comment: (NOTE) Calculated using the CKD-EPI Creatinine Equation (2021)    Anion gap 10 5 - 15    Comment: Performed at [facility], [place] [address], [place], [place] [address]  Heparin level (unfractionated)     Status: None   Collection Time: 01/09/21  1:43 AM  Result Value Ref Range   Heparin Unfractionated 0.37 0.30 - 0.70 IU/mL    Comment: (NOTE) If heparin results are below expected values, and patient dosage has  been confirmed, suggest follow up testing of antithrombin III levels. Performed at [facility], [place] [address], [place], [place] [address]   APTT     Status: Abnormal   Collection Time: 01/09/21  1:43 AM  Result Value Ref Range   aPTT 73 (H) 24 - 36 seconds    Comment:        IF BASELINE aPTT IS ELEVATED, SUGGEST PATIENT RISK ASSESSMENT BE USED TO DETERMINE APPROPRIATE ANTICOAGULANT THERAPY. Performed at [facility], [place] [address] [place], [place] [address]    CT ABDOMEN PELVIS WO CONTRAST  Result Date: 01/07/2021 CLINICAL DATA:  Abdominal pain,  cardiac catheterization today EXAM: CT ABDOMEN AND PELVIS WITHOUT CONTRAST TECHNIQUE: Multidetector CT imaging of the abdomen and pelvis was performed following the standard protocol without IV contrast. Oral enteric contrast was administered. COMPARISON:  None. FINDINGS: Lower chest: No acute abnormality. Hepatobiliary: Fluid attenuation lesions of the left and right lobes of the liver, incompletely characterized on this noncontrast examination although likely cysts or hemangiomata. Small gallstones and sludge in the gallbladder. Gallbladder wall thickening, or biliary dilatation. Pancreas: Unremarkable. No pancreatic ductal dilatation or surrounding inflammatory changes. Spleen: Normal in size without significant abnormality. Adrenals/Urinary Tract: Adrenal glands are unremarkable. Kidneys are normal, without renal calculi, solid lesion, or hydronephrosis. Excreted contrast in the urinary bladder. Stomach/Bowel: Stomach is within  normal limits. Appendix appears normal. No evidence of bowel wall thickening, distention, or inflammatory changes. Sigmoid diverticulosis. Vascular/Lymphatic: Aortic atherosclerosis. No enlarged abdominal or pelvic lymph nodes. Reproductive: No mass or other significant abnormality. Other: Status post bilateral inguinal hernia repair. No abdominopelvic ascites. Musculoskeletal: No acute or significant osseous findings. IMPRESSION: 1. No acute noncontrast CT findings of the abdomen or pelvis to explain abdominal pain. 2. Small gallstones and sludge in the gallbladder without evidence of acute cholecystitis. 3. Sigmoid diverticulosis without evidence of acute diverticulitis. Aortic Atherosclerosis (ICD10-I70.0). Electronically Signed   By: Eddie Candle M.D.   On: 01/07/2021 18:46   US Abdomen Limited  Result Date: 01/08/2021 CLINICAL DATA:  Acute onset abdominal pain EXAM: ULTRASOUND ABDOMEN LIMITED RIGHT UPPER QUADRANT COMPARISON:  CT 01/07/2021 FINDINGS: Gallbladder: Moderate sludge  in the gallbladder. Shadowing stones measuring up to 7 mm. Negative sonographic Murphy. Increased wall thickness at 5.3 mm. Common bile duct: Diameter: 8.7 mm Liver: No focal lesion identified. Within normal limits in parenchymal echogenicity. Portal vein is patent on color Doppler imaging with normal direction of blood flow towards the liver. Hypodense lesions on CT are not well seen by sonography. Other: None. IMPRESSION: 1. Moderate sludge in the gallbladder with small stones. Increased wall thickness but negative sonographic Percell Miller, findings are indeterminate for cholecystitis. Consider correlation with nuclear medicine hepatobiliary imaging. 2. Slightly enlarged common bile duct. Recommend correlation with LFTs, further evaluation with MRCP may be obtained if clinically indicated Electronically Signed   By: Donavan Foil M.D.   On: 01/08/2021 20:53   CT CORONARY MORPH W/CTA COR W/SCORE W/CA W/CM &/OR WO/CM  Addendum Date: 01/08/2021   ADDENDUM REPORT: 01/08/2021 08:07 EXAM: OVER-READ INTERPRETATION  CT CHEST The following report is an over-read performed by radiologist Dr. Rebekah Chesterfield [facility] on 01/08/2021. This over-read does not include interpretation of cardiac or coronary anatomy or pathology. The coronary calcium score and cardiac CTA interpretation by the cardiologist is attached. COMPARISON:  None. FINDINGS: Aortic atherosclerosis. Within the visualized portions of the thorax there are no suspicious appearing pulmonary nodules or masses, there is no acute consolidative airspace disease, no pleural effusions, no pneumothorax and no lymphadenopathy. Visualized portions of the upper abdomen are unremarkable. There are no aggressive appearing lytic or blastic lesions noted in the visualized portions of the skeleton. IMPRESSION: 1.  Aortic Atherosclerosis (ICD10-I70.0). Electronically Signed   By: Vinnie Langton M.D.   On: 01/08/2021 08:07   Result Date: 01/08/2021 CLINICAL DATA:   70 Year-old White Male EXAM: Cardiac/Coronary  CTA TECHNIQUE: The patient was scanned on a [employer]. FINDINGS: A 100 kV prospective scan was triggered in the descending thoracic aorta at 111 HU's. Axial non-contrast 3 mm slices were carried out through the heart. The data set was analyzed on a dedicated work station and scored using the [place]. Gantry rotation speed was 250 msecs and collimation was .6 mm. No beta blockade and 0.8 mg of sl NTG was given. The 3D data set was reconstructed in 5% intervals of the 67-82 % of the R-R cycle. Diastolic phases were analyzed on a dedicated work station using MPR, MIP and VRT modes. The patient received 80 cc of contrast. Aorta: Upper limit of normal: 38 mm. Aortic Atherosclerosis noted. No dissection. Aortic Valve:  Tri-leaflet.  Annular calcification noted. Coronary Arteries:  Normal coronary origin.  Right dominance. Coronary calcium score of 36. This was 29th percentile for age, sex, and race matched control. RCA is a large dominant artery  that gives rise to PDA and PLA. There is no plaque. Left main is a large artery that gives rise to LAD and LCX arteries. There is no plaque. LAD is a large vessel that gives off a D1 vessel. There is a minimal, non-obstructive calcified plaque (1-24%) in the proximal LAD. LCX is a non-dominant artery that gives rise to two small OM branches. There is no plaque. Other findings: Normal pulmonary vein drainage into the left atrium. Normal left atrial appendage without a thrombus. Normal size of the pulmonary artery. Extra-cardiac findings: See attached radiology report for non-cardiac structures. IMPRESSION: 1. Coronary calcium score of 36. This was 29th percentile for age, sex, and race matched control. 2. Normal coronary origin with right dominance. 3. Aortic Atherosclerosis noted. 4. CAD-RADS 1. 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 Consider non-atherosclerotic causes of chest pain. Consider  preventive therapy and risk factor modification. Electronically Signed: By: Rudean Haskell MD On: 01/07/2021 14:30   ECHOCARDIOGRAM COMPLETE  Result Date: 01/07/2021    ECHOCARDIOGRAM REPORT   Patient Name:   FRANDY BASNETT Date of Exam: 01/07/2021 Medical Rec #:  [id number]          Height:       70.0 in Accession #:    [id number]         Weight:       221.7 lb Date of Birth:  [birth year]          BSA:          2.181 m Patient Age:    28 years           BP:           118/85 mmHg Patient Gender: M                  HR:           58 bpm. Exam Location:  Inpatient Procedure: 2D Echo, Color Doppler and Cardiac Doppler Indications:    Acute Ischemic Heart Disease i24.9  History:        Patient has prior history of Echocardiogram examinations, most                 recent 01/29/2018. Arrythmias:Atrial Fibrillation; Risk                 Factors:Hypertension and Dyslipidemia.  Sonographer:    Raquel Sarna Senior RDCS Referring Phys: [id number] [place]  1. Left ventricular ejection fraction, by estimation, is 50 to 55%. The left ventricle has low normal function. The left ventricle has no regional wall motion abnormalities. Left ventricular diastolic parameters were normal.  2. Right ventricular systolic function is normal. The right ventricular size is normal.  3. The mitral valve is grossly normal. No evidence of mitral valve regurgitation.  4. The aortic valve is tricuspid. There is mild calcification of the aortic valve. There is mild thickening of the aortic valve. Aortic valve regurgitation is not visualized. No aortic stenosis is present.  5. Aortic dilatation noted. There is mild to moderate dilatation of the aortic root, measuring 44 mm. There is borderline dilatation of the ascending aorta, measuring 37 mm. There is mild dilatation of the transverse aorta, measuring 37 mm. Comparison(s): A prior study was performed on 01/29/2018. Prior images reviewed side by side. Slight decrease in LV  function, aortic dilation noted. FINDINGS  Left Ventricle: Left ventricular ejection fraction, by estimation, is 50 to 55%. The left ventricle has low normal function.  The left ventricle has no regional wall motion abnormalities. The left ventricular internal cavity size was small. There is no left ventricular hypertrophy. Left ventricular diastolic parameters were normal. Right Ventricle: The right ventricular size is normal. No increase in right ventricular wall thickness. Right ventricular systolic function is normal. Left Atrium: Left atrial size was normal in size. Right Atrium: Right atrial size was normal in size. Normal Area- Volume is discordant with visual estimates. Pericardium: Trivial pericardial effusion is present. Mitral Valve: The mitral valve is grossly normal. No evidence of mitral valve regurgitation. Tricuspid Valve: The tricuspid valve is not well visualized. Tricuspid valve regurgitation is not demonstrated. Aortic Valve: The aortic valve is tricuspid. There is mild calcification of the aortic valve. There is mild thickening of the aortic valve. There is mild aortic valve annular calcification. Aortic valve regurgitation is not visualized. No aortic stenosis  is present. Pulmonic Valve: The pulmonic valve was not well visualized. Pulmonic valve regurgitation is not visualized. No evidence of pulmonic stenosis. Aorta: Aortic dilatation noted. There is mild to moderate dilatation of the aortic root, measuring 44 mm. There is borderline dilatation of the ascending aorta, measuring 37 mm. There is mild dilatation of the transverse aorta, measuring 37 mm. IAS/Shunts: The atrial septum is grossly normal.  LEFT VENTRICLE PLAX 2D LVIDd:         3.80 cm  Diastology LVIDs:         2.70 cm  LV e' medial:    8.92 cm/s LV PW:         1.30 cm  LV E/e' medial:  6.3 LV IVS:        1.60 cm  LV e' lateral:   11.00 cm/s LVOT diam:     2.40 cm  LV E/e' lateral: 5.1 LV SV:         81 LV SV Index:   37 LVOT Area:      4.52 cm  RIGHT VENTRICLE RV S prime:     10.70 cm/s TAPSE (M-mode): 2.4 cm LEFT ATRIUM           Index       RIGHT ATRIUM           Index LA diam:      3.60 cm 1.65 cm/m  RA Area:     38.70 cm LA Vol (A4C): 52.6 ml 24.12 ml/m RA Volume:   162.00 ml 74.29 ml/m  AORTIC VALVE LVOT Vmax:   87.50 cm/s LVOT Vmean:  65.900 cm/s LVOT VTI:    0.178 m  AORTA Ao Root diam: 4.40 cm Ao Asc diam:  3.70 cm MITRAL VALVE MV Area (PHT): 2.42 cm    SHUNTS MV Decel Time: 313 msec    Systemic VTI:  0.18 m MV E velocity: 56.60 cm/s  Systemic Diam: 2.40 cm MV A velocity: 25.30 cm/s MV E/A ratio:  2.24 Rudean Haskell MD Electronically signed by Rudean Haskell MD Signature Date/Time: 01/07/2021/12:53:44 PM    Final       Assessment/Plan HLD HTN A. Fib on eliquis GERD OA Gout   Biliary pancreatitis - Lipase 988 on 2/14 and 353 today  - Tbili 5.6 yesterday and 6.0 today, AST/ALT trending down today, Alk Phos slightly up  - no leukocytosis 2/13 and afebrile - repeat CBC today  - GI planning ERCP tomorrow  - we will likely plan for lap chole on 2/17 pending no post ERCP complications like pancreatitis. -discussed this plan with the patient and he understands and  agrees.  FEN: NPO VTE: heparin gtt ID: no current abx   Henreitta Cea, [facility] 01/09/2021, 11:13 AM Please see Amion for pager number during day hours 7:00am-4:30pm

## 2021-01-09 NOTE — Progress Notes (Addendum)
Progress Note   Subjective  Recurrent upper abdominal pain last night now has abated.    Objective  Vital signs in last 24 hours: Temp:  [97.9 F (36.6 C)-98.3 F (36.8 C)] 97.9 F (36.6 C) (02/15 0437) Pulse Rate:  [54-64] 64 (02/15 0437) Resp:  [16-17] 17 (02/15 0437) BP: (128-141)/(72-79) 128/77 (02/15 0437) SpO2:  [96 %-100 %] 100 % (02/15 0437) Last BM Date: 01/08/21  General: Alert, well-developed, in NAD Heart:  Regular rate and rhythm; no murmurs Chest: Clear to ascultation bilaterally Abdomen:  Soft, mild upper abdominal tenderness and nondistended. Normal bowel sounds, without guarding, and without rebound.   Extremities:  Without edema. Neurologic:  Alert and  oriented x4; grossly normal neurologically. Psych:  Alert and cooperative. Normal mood and affect.  Intake/Output from previous day: 02/14 0701 - 02/15 0700 In: -  Out: 700 [Urine:700] Intake/Output this shift: No intake/output data recorded.  Lab Results: Recent Labs    01/06/21 1302 01/07/21 0457  WBC 8.9 4.1  HGB 14.1 14.2  HCT 42.3 40.0  PLT 164 143*   BMET Recent Labs    01/06/21 1302 01/07/21 0457 01/09/21 0143  NA 139 139 138  K 3.9 4.1 3.8  CL 104 105 103  CO2 22 25 25   GLUCOSE 164* 125* 98  BUN 23 16 11   CREATININE 1.10 1.02 1.02  CALCIUM 9.1 8.9 8.8*   LFT Recent Labs    01/08/21 1300 01/09/21 0143  PROT 6.0* 5.7*  ALBUMIN 3.2* 2.9*  AST 322* 248*  ALT 492* 440*  ALKPHOS 155* 176*  BILITOT 5.6* 6.0*  BILIDIR 3.3*  --   IBILI 2.3*  --    PT/INR No results for input(s): LABPROT, INR in the last 72 hours. Hepatitis Panel No results for input(s): HEPBSAG, HCVAB, HEPAIGM, HEPBIGM in the last 72 hours.  Studies/Results: CT ABDOMEN PELVIS WO CONTRAST  Result Date: 01/07/2021 CLINICAL DATA:  Abdominal pain, cardiac catheterization today EXAM: CT ABDOMEN AND PELVIS WITHOUT CONTRAST TECHNIQUE: Multidetector CT imaging of the abdomen and pelvis was performed  following the standard protocol without IV contrast. Oral enteric contrast was administered. COMPARISON:  None. FINDINGS: Lower chest: No acute abnormality. Hepatobiliary: Fluid attenuation lesions of the left and right lobes of the liver, incompletely characterized on this noncontrast examination although likely cysts or hemangiomata. Small gallstones and sludge in the gallbladder. Gallbladder wall thickening, or biliary dilatation. Pancreas: Unremarkable. No pancreatic ductal dilatation or surrounding inflammatory changes. Spleen: Normal in size without significant abnormality. Adrenals/Urinary Tract: Adrenal glands are unremarkable. Kidneys are normal, without renal calculi, solid lesion, or hydronephrosis. Excreted contrast in the urinary bladder. Stomach/Bowel: Stomach is within normal limits. Appendix appears normal. No evidence of bowel wall thickening, distention, or inflammatory changes. Sigmoid diverticulosis. Vascular/Lymphatic: Aortic atherosclerosis. No enlarged abdominal or pelvic lymph nodes. Reproductive: No mass or other significant abnormality. Other: Status post bilateral inguinal hernia repair. No abdominopelvic ascites. Musculoskeletal: No acute or significant osseous findings. IMPRESSION: 1. No acute noncontrast CT findings of the abdomen or pelvis to explain abdominal pain. 2. Small gallstones and sludge in the gallbladder without evidence of acute cholecystitis. 3. Sigmoid diverticulosis without evidence of acute diverticulitis. Aortic Atherosclerosis (ICD10-I70.0). Electronically Signed   By: Lauralyn PrimesAlex  Bibbey M.D.   On: 01/07/2021 18:46   US Abdomen Limited  Result Date: 01/08/2021 CLINICAL DATA:  Acute onset abdominal pain EXAM: ULTRASOUND ABDOMEN LIMITED RIGHT UPPER QUADRANT COMPARISON:  CT 01/07/2021 FINDINGS: Gallbladder: Moderate sludge in the gallbladder. Shadowing stones measuring up to  7 mm. Negative sonographic Murphy. Increased wall thickness at 5.3 mm. Common bile duct: Diameter:  8.7 mm Liver: No focal lesion identified. Within normal limits in parenchymal echogenicity. Portal vein is patent on color Doppler imaging with normal direction of blood flow towards the liver. Hypodense lesions on CT are not well seen by sonography. Other: None. IMPRESSION: 1. Moderate sludge in the gallbladder with small stones. Increased wall thickness but negative sonographic Eulah Pont, findings are indeterminate for cholecystitis. Consider correlation with nuclear medicine hepatobiliary imaging. 2. Slightly enlarged common bile duct. Recommend correlation with LFTs, further evaluation with MRCP may be obtained if clinically indicated Electronically Signed   By: Jasmine Pang M.D.   On: 01/08/2021 20:53   CT CORONARY MORPH W/CTA COR W/SCORE W/CA W/CM &/OR WO/CM  Addendum Date: 01/08/2021   ADDENDUM REPORT: 01/08/2021 08:07 EXAM: OVER-READ INTERPRETATION  CT CHEST The following report is an over-read performed by radiologist Dr. Royal Piedra [facility] on 01/08/2021. This over-read does not include interpretation of cardiac or coronary anatomy or pathology. The coronary calcium score and cardiac CTA interpretation by the cardiologist is attached. COMPARISON:  None. FINDINGS: Aortic atherosclerosis. Within the visualized portions of the thorax there are no suspicious appearing pulmonary nodules or masses, there is no acute consolidative airspace disease, no pleural effusions, no pneumothorax and no lymphadenopathy. Visualized portions of the upper abdomen are unremarkable. There are no aggressive appearing lytic or blastic lesions noted in the visualized portions of the skeleton. IMPRESSION: 1.  Aortic Atherosclerosis (ICD10-I70.0). Electronically Signed   By: Trudie Reed M.D.   On: 01/08/2021 08:07   Result Date: 01/08/2021 CLINICAL DATA:  70 Year-old White Male EXAM: Cardiac/Coronary  CTA TECHNIQUE: The patient was scanned on a [employer]. FINDINGS: A 100 kV prospective scan  was triggered in the descending thoracic aorta at 111 HU's. Axial non-contrast 3 mm slices were carried out through the heart. The data set was analyzed on a dedicated work station and scored using the Agatson method. Gantry rotation speed was 250 msecs and collimation was .6 mm. No beta blockade and 0.8 mg of sl NTG was given. The 3D data set was reconstructed in 5% intervals of the 67-82 % of the R-R cycle. Diastolic phases were analyzed on a dedicated work station using MPR, MIP and VRT modes. The patient received 80 cc of contrast. Aorta: Upper limit of normal: 38 mm. Aortic Atherosclerosis noted. No dissection. Aortic Valve:  Tri-leaflet.  Annular calcification noted. Coronary Arteries:  Normal coronary origin.  Right dominance. Coronary calcium score of 36. This was 29th percentile for age, sex, and race matched control. RCA is a large dominant artery that gives rise to PDA and PLA. There is no plaque. Left main is a large artery that gives rise to LAD and LCX arteries. There is no plaque. LAD is a large vessel that gives off a D1 vessel. There is a minimal, non-obstructive calcified plaque (1-24%) in the proximal LAD. LCX is a non-dominant artery that gives rise to two small OM branches. There is no plaque. Other findings: Normal pulmonary vein drainage into the left atrium. Normal left atrial appendage without a thrombus. Normal size of the pulmonary artery. Extra-cardiac findings: See attached radiology report for non-cardiac structures. IMPRESSION: 1. Coronary calcium score of 36. This was 29th percentile for age, sex, and race matched control. 2. Normal coronary origin with right dominance. 3. Aortic Atherosclerosis noted. 4. CAD-RADS 1. 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 Consider non-atherosclerotic causes  of chest pain. Consider preventive therapy and risk factor modification. Electronically Signed: By: Riley Lam MD On: 01/07/2021 14:30   ECHOCARDIOGRAM  COMPLETE  Result Date: 01/07/2021    ECHOCARDIOGRAM REPORT   Patient Name:   LELYND POER Date of Exam: 01/07/2021 Medical Rec #:  [id number]          Height:       70.0 in Accession #:    [id number]         Weight:       221.7 lb Date of Birth:  1951-04-02          BSA:          2.181 m Patient Age:    69 years           BP:           118/85 mmHg Patient Gender: M                  HR:           58 bpm. Exam Location:  Inpatient Procedure: 2D Echo, Color Doppler and Cardiac Doppler Indications:    Acute Ischemic Heart Disease i24.9  History:        Patient has prior history of Echocardiogram examinations, most                 recent 01/29/2018. Arrythmias:Atrial Fibrillation; Risk                 Factors:Hypertension and Dyslipidemia.  Sonographer:    Irving Burton Senior RDCS Referring Phys: [id number] MATTHEW A CARLISLE IMPRESSIONS  1. Left ventricular ejection fraction, by estimation, is 50 to 55%. The left ventricle has low normal function. The left ventricle has no regional wall motion abnormalities. Left ventricular diastolic parameters were normal.  2. Right ventricular systolic function is normal. The right ventricular size is normal.  3. The mitral valve is grossly normal. No evidence of mitral valve regurgitation.  4. The aortic valve is tricuspid. There is mild calcification of the aortic valve. There is mild thickening of the aortic valve. Aortic valve regurgitation is not visualized. No aortic stenosis is present.  5. Aortic dilatation noted. There is mild to moderate dilatation of the aortic root, measuring 44 mm. There is borderline dilatation of the ascending aorta, measuring 37 mm. There is mild dilatation of the transverse aorta, measuring 37 mm. Comparison(s): A prior study was performed on 01/29/2018. Prior images reviewed side by side. Slight decrease in LV function, aortic dilation noted. FINDINGS  Left Ventricle: Left ventricular ejection fraction, by estimation, is 50 to 55%. The left ventricle has  low normal function. The left ventricle has no regional wall motion abnormalities. The left ventricular internal cavity size was small. There is no left ventricular hypertrophy. Left ventricular diastolic parameters were normal. Right Ventricle: The right ventricular size is normal. No increase in right ventricular wall thickness. Right ventricular systolic function is normal. Left Atrium: Left atrial size was normal in size. Right Atrium: Right atrial size was normal in size. Normal Area- Volume is discordant with visual estimates. Pericardium: Trivial pericardial effusion is present. Mitral Valve: The mitral valve is grossly normal. No evidence of mitral valve regurgitation. Tricuspid Valve: The tricuspid valve is not well visualized. Tricuspid valve regurgitation is not demonstrated. Aortic Valve: The aortic valve is tricuspid. There is mild calcification of the aortic valve. There is mild thickening of the aortic valve. There is mild aortic valve annular calcification. Aortic valve regurgitation  is not visualized. No aortic stenosis  is present. Pulmonic Valve: The pulmonic valve was not well visualized. Pulmonic valve regurgitation is not visualized. No evidence of pulmonic stenosis. Aorta: Aortic dilatation noted. There is mild to moderate dilatation of the aortic root, measuring 44 mm. There is borderline dilatation of the ascending aorta, measuring 37 mm. There is mild dilatation of the transverse aorta, measuring 37 mm. IAS/Shunts: The atrial septum is grossly normal.  LEFT VENTRICLE PLAX 2D LVIDd:         3.80 cm  Diastology LVIDs:         2.70 cm  LV e' medial:    8.92 cm/s LV PW:         1.30 cm  LV E/e' medial:  6.3 LV IVS:        1.60 cm  LV e' lateral:   11.00 cm/s LVOT diam:     2.40 cm  LV E/e' lateral: 5.1 LV SV:         81 LV SV Index:   37 LVOT Area:     4.52 cm  RIGHT VENTRICLE RV S prime:     10.70 cm/s TAPSE (M-mode): 2.4 cm LEFT ATRIUM           Index       RIGHT ATRIUM           Index LA  diam:      3.60 cm 1.65 cm/m  RA Area:     38.70 cm LA Vol (A4C): 52.6 ml 24.12 ml/m RA Volume:   162.00 ml 74.29 ml/m  AORTIC VALVE LVOT Vmax:   87.50 cm/s LVOT Vmean:  65.900 cm/s LVOT VTI:    0.178 m  AORTA Ao Root diam: 4.40 cm Ao Asc diam:  3.70 cm MITRAL VALVE MV Area (PHT): 2.42 cm    SHUNTS MV Decel Time: 313 msec    Systemic VTI:  0.18 m MV E velocity: 56.60 cm/s  Systemic Diam: 2.40 cm MV A velocity: 25.30 cm/s MV E/A ratio:  2.24 Riley Lam MD Electronically signed by Riley Lam MD Signature Date/Time: 01/07/2021/12:53:44 PM    Final       Assessment & Recommendations   1. Cholelithiasis, possible cholecystitis - increased GB wall thickness, elevated LFTs, mildly dilated CBD, mild biliary pancreatitis. NPO today. Trend LFTs, lipase. IVF. ERCP tomorrow at 0900. Consult General Surgery today.   2. Afib currently on IV heparin, on Eliquis as outpatient. Continue to hold Eliquis. Hold Heparin 6 hours prior to ERCP at 0300.     LOS: 3 days   Judie Petit T. Russella Dar MD 01/09/2021, 8:28 AM [phone]

## 2021-01-09 NOTE — Progress Notes (Addendum)
Progress Note  Patient Name: Todd Barrett. Date of Encounter: 01/09/2021  [facility] Cardiologist: Lance Muss, MD   Subjective   Abd pain is much better this morning. He is relieved to have found a reason for his pain.   Remains on SR on telemetry, no chest pain  Inpatient Medications    Scheduled Meds:  aspirin EC  81 mg Oral Daily   atorvastatin  10 mg Oral Daily   lisinopril  10 mg Oral Daily   magnesium hydroxide  30 mL Oral Daily   Continuous Infusions:  ciprofloxacin     heparin 1,750 Units/hr (01/09/21 0917)   ondansetron (ZOFRAN) IV     PRN Meds: acetaminophen, HYDROcodone-acetaminophen, morphine injection, nitroGLYCERIN, ondansetron (ZOFRAN) IV, simethicone   Vital Signs    Vitals:   01/08/21 1211 01/08/21 1758 01/09/21 0023 01/09/21 0437  BP: 128/72 (!) 141/79 136/75 128/77  Pulse: (!) 59 (!) 54 60 64  Resp: 16 16 17 17   Temp: 98.3 F (36.8 C) 98.2 F (36.8 C) 98.3 F (36.8 C) 97.9 F (36.6 C)  TempSrc: Oral Oral Oral Oral  SpO2: 97% 96% 99% 100%  Weight:      Height:        Intake/Output Summary (Last 24 hours) at 01/09/2021 1010 Last data filed at 01/09/2021 0439 Gross per 24 hour  Intake --  Output 700 ml  Net -700 ml   Last 3 Weights 01/07/2021 01/06/2021 12/25/2020  Weight (lbs) 221 lb 11.2 oz 225 lb 227 lb  Weight (kg) 100.562 kg 102.059 kg 102.967 kg      Telemetry    SR - Personally Reviewed  ECG    No new tracing this morning  Physical Exam   GEN: No acute distress.   Neck: No JVD Cardiac: RRR, no murmurs, rubs, or gallops.  Respiratory: Clear to auscultation bilaterally. GI: Soft, nontender, non-distended  MS: No edema; No deformity. Neuro:  Nonfocal  Psych: Normal affect   Labs    High Sensitivity Troponin:   Recent Labs  Lab 12/21/20 1003 12/21/20 1230 01/06/21 1737 01/06/21 2013  TROPONINIHS 8 7 15 13       Chemistry Recent Labs  Lab 01/06/21 1302 01/07/21 0457 01/08/21 1300  01/09/21 0143  NA 139 139  --  138  K 3.9 4.1  --  3.8  CL 104 105  --  103  CO2 22 25  --  25  GLUCOSE 164* 125*  --  98  BUN 23 16  --  11  CREATININE 1.10 1.02  --  1.02  CALCIUM 9.1 8.9  --  8.8*  PROT  --   --  6.0* 5.7*  ALBUMIN  --   --  3.2* 2.9*  AST  --   --  322* 248*  ALT  --   --  492* 440*  ALKPHOS  --   --  155* 176*  BILITOT  --   --  5.6* 6.0*  GFRNONAA >60 >60  --  >60  ANIONGAP 13 9  --  10     Hematology Recent Labs  Lab 01/06/21 1302 01/07/21 0457  WBC 8.9 4.1  RBC 4.69 4.51  HGB 14.1 14.2  HCT 42.3 40.0  MCV 90.2 88.7  MCH 30.1 31.5  MCHC 33.3 35.5  RDW 12.5 12.6  PLT 164 143*    BNPNo results for input(s): BNP, PROBNP in the last 168 hours.   DDimer  Recent Labs  Lab 01/06/21 1302  DDIMER 0.83*     Radiology    CT ABDOMEN PELVIS WO CONTRAST  Result Date: 01/07/2021 CLINICAL DATA:  Abdominal pain, cardiac catheterization today EXAM: CT ABDOMEN AND PELVIS WITHOUT CONTRAST TECHNIQUE: Multidetector CT imaging of the abdomen and pelvis was performed following the standard protocol without IV contrast. Oral enteric contrast was administered. COMPARISON:  None. FINDINGS: Lower chest: No acute abnormality. Hepatobiliary: Fluid attenuation lesions of the left and right lobes of the liver, incompletely characterized on this noncontrast examination although likely cysts or hemangiomata. Small gallstones and sludge in the gallbladder. Gallbladder wall thickening, or biliary dilatation. Pancreas: Unremarkable. No pancreatic ductal dilatation or surrounding inflammatory changes. Spleen: Normal in size without significant abnormality. Adrenals/Urinary Tract: Adrenal glands are unremarkable. Kidneys are normal, without renal calculi, solid lesion, or hydronephrosis. Excreted contrast in the urinary bladder. Stomach/Bowel: Stomach is within normal limits. Appendix appears normal. No evidence of bowel wall thickening, distention, or inflammatory changes. Sigmoid  diverticulosis. Vascular/Lymphatic: Aortic atherosclerosis. No enlarged abdominal or pelvic lymph nodes. Reproductive: No mass or other significant abnormality. Other: Status post bilateral inguinal hernia repair. No abdominopelvic ascites. Musculoskeletal: No acute or significant osseous findings. IMPRESSION: 1. No acute noncontrast CT findings of the abdomen or pelvis to explain abdominal pain. 2. Small gallstones and sludge in the gallbladder without evidence of acute cholecystitis. 3. Sigmoid diverticulosis without evidence of acute diverticulitis. Aortic Atherosclerosis (ICD10-I70.0). Electronically Signed   By: Lauralyn Primes M.D.   On: 01/07/2021 18:46   US Abdomen Limited  Result Date: 01/08/2021 CLINICAL DATA:  Acute onset abdominal pain EXAM: ULTRASOUND ABDOMEN LIMITED RIGHT UPPER QUADRANT COMPARISON:  CT 01/07/2021 FINDINGS: Gallbladder: Moderate sludge in the gallbladder. Shadowing stones measuring up to 7 mm. Negative sonographic Murphy. Increased wall thickness at 5.3 mm. Common bile duct: Diameter: 8.7 mm Liver: No focal lesion identified. Within normal limits in parenchymal echogenicity. Portal vein is patent on color Doppler imaging with normal direction of blood flow towards the liver. Hypodense lesions on CT are not well seen by sonography. Other: None. IMPRESSION: 1. Moderate sludge in the gallbladder with small stones. Increased wall thickness but negative sonographic Eulah Pont, findings are indeterminate for cholecystitis. Consider correlation with nuclear medicine hepatobiliary imaging. 2. Slightly enlarged common bile duct. Recommend correlation with LFTs, further evaluation with MRCP may be obtained if clinically indicated Electronically Signed   By: Jasmine Pang M.D.   On: 01/08/2021 20:53   CT CORONARY MORPH W/CTA COR W/SCORE W/CA W/CM &/OR WO/CM  Addendum Date: 01/08/2021   ADDENDUM REPORT: 01/08/2021 08:07 EXAM: OVER-READ INTERPRETATION  CT CHEST The following report is an over-read  performed by radiologist Dr. Royal Piedra [facility] on 01/08/2021. This over-read does not include interpretation of cardiac or coronary anatomy or pathology. The coronary calcium score and cardiac CTA interpretation by the cardiologist is attached. COMPARISON:  None. FINDINGS: Aortic atherosclerosis. Within the visualized portions of the thorax there are no suspicious appearing pulmonary nodules or masses, there is no acute consolidative airspace disease, no pleural effusions, no pneumothorax and no lymphadenopathy. Visualized portions of the upper abdomen are unremarkable. There are no aggressive appearing lytic or blastic lesions noted in the visualized portions of the skeleton. IMPRESSION: 1.  Aortic Atherosclerosis (ICD10-I70.0). Electronically Signed   By: Trudie Reed M.D.   On: 01/08/2021 08:07   Result Date: 01/08/2021 CLINICAL DATA:  70 Year-old White Male EXAM: Cardiac/Coronary  CTA TECHNIQUE: The patient was scanned on a [employer]. FINDINGS: A 100 kV prospective scan was triggered in  the descending thoracic aorta at 111 HU's. Axial non-contrast 3 mm slices were carried out through the heart. The data set was analyzed on a dedicated work station and scored using the Agatson method. Gantry rotation speed was 250 msecs and collimation was .6 mm. No beta blockade and 0.8 mg of sl NTG was given. The 3D data set was reconstructed in 5% intervals of the 67-82 % of the R-R cycle. Diastolic phases were analyzed on a dedicated work station using MPR, MIP and VRT modes. The patient received 80 cc of contrast. Aorta: Upper limit of normal: 38 mm. Aortic Atherosclerosis noted. No dissection. Aortic Valve:  Tri-leaflet.  Annular calcification noted. Coronary Arteries:  Normal coronary origin.  Right dominance. Coronary calcium score of 36. This was 29th percentile for age, sex, and race matched control. RCA is a large dominant artery that gives rise to PDA and PLA. There is no  plaque. Left main is a large artery that gives rise to LAD and LCX arteries. There is no plaque. LAD is a large vessel that gives off a D1 vessel. There is a minimal, non-obstructive calcified plaque (1-24%) in the proximal LAD. LCX is a non-dominant artery that gives rise to two small OM branches. There is no plaque. Other findings: Normal pulmonary vein drainage into the left atrium. Normal left atrial appendage without a thrombus. Normal size of the pulmonary artery. Extra-cardiac findings: See attached radiology report for non-cardiac structures. IMPRESSION: 1. Coronary calcium score of 36. This was 29th percentile for age, sex, and race matched control. 2. Normal coronary origin with right dominance. 3. Aortic Atherosclerosis noted. 4. CAD-RADS 1. 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 Consider non-atherosclerotic causes of chest pain. Consider preventive therapy and risk factor modification. Electronically Signed: By: Riley Lam MD On: 01/07/2021 14:30   ECHOCARDIOGRAM COMPLETE  Result Date: 01/07/2021    ECHOCARDIOGRAM REPORT   Patient Name:   KALLAN MERRICK Date of Exam: 01/07/2021 Medical Rec #:  [id number]          Height:       70.0 in Accession #:    [id number]         Weight:       221.7 lb Date of Birth:  1951/07/29          BSA:          2.181 m Patient Age:    69 years           BP:           118/85 mmHg Patient Gender: M                  HR:           58 bpm. Exam Location:  Inpatient Procedure: 2D Echo, Color Doppler and Cardiac Doppler Indications:    Acute Ischemic Heart Disease i24.9  History:        Patient has prior history of Echocardiogram examinations, most                 recent 01/29/2018. Arrythmias:Atrial Fibrillation; Risk                 Factors:Hypertension and Dyslipidemia.  Sonographer:    Irving Burton Senior RDCS Referring Phys: [id number] MATTHEW A CARLISLE IMPRESSIONS  1. Left ventricular ejection fraction, by estimation, is 50 to 55%. The left ventricle has  low normal function. The left ventricle has no regional wall motion abnormalities. Left ventricular diastolic parameters were  normal.  2. Right ventricular systolic function is normal. The right ventricular size is normal.  3. The mitral valve is grossly normal. No evidence of mitral valve regurgitation.  4. The aortic valve is tricuspid. There is mild calcification of the aortic valve. There is mild thickening of the aortic valve. Aortic valve regurgitation is not visualized. No aortic stenosis is present.  5. Aortic dilatation noted. There is mild to moderate dilatation of the aortic root, measuring 44 mm. There is borderline dilatation of the ascending aorta, measuring 37 mm. There is mild dilatation of the transverse aorta, measuring 37 mm. Comparison(s): A prior study was performed on 01/29/2018. Prior images reviewed side by side. Slight decrease in LV function, aortic dilation noted. FINDINGS  Left Ventricle: Left ventricular ejection fraction, by estimation, is 50 to 55%. The left ventricle has low normal function. The left ventricle has no regional wall motion abnormalities. The left ventricular internal cavity size was small. There is no left ventricular hypertrophy. Left ventricular diastolic parameters were normal. Right Ventricle: The right ventricular size is normal. No increase in right ventricular wall thickness. Right ventricular systolic function is normal. Left Atrium: Left atrial size was normal in size. Right Atrium: Right atrial size was normal in size. Normal Area- Volume is discordant with visual estimates. Pericardium: Trivial pericardial effusion is present. Mitral Valve: The mitral valve is grossly normal. No evidence of mitral valve regurgitation. Tricuspid Valve: The tricuspid valve is not well visualized. Tricuspid valve regurgitation is not demonstrated. Aortic Valve: The aortic valve is tricuspid. There is mild calcification of the aortic valve. There is mild thickening of the aortic  valve. There is mild aortic valve annular calcification. Aortic valve regurgitation is not visualized. No aortic stenosis  is present. Pulmonic Valve: The pulmonic valve was not well visualized. Pulmonic valve regurgitation is not visualized. No evidence of pulmonic stenosis. Aorta: Aortic dilatation noted. There is mild to moderate dilatation of the aortic root, measuring 44 mm. There is borderline dilatation of the ascending aorta, measuring 37 mm. There is mild dilatation of the transverse aorta, measuring 37 mm. IAS/Shunts: The atrial septum is grossly normal.  LEFT VENTRICLE PLAX 2D LVIDd:         3.80 cm  Diastology LVIDs:         2.70 cm  LV e' medial:    8.92 cm/s LV PW:         1.30 cm  LV E/e' medial:  6.3 LV IVS:        1.60 cm  LV e' lateral:   11.00 cm/s LVOT diam:     2.40 cm  LV E/e' lateral: 5.1 LV SV:         81 LV SV Index:   37 LVOT Area:     4.52 cm  RIGHT VENTRICLE RV S prime:     10.70 cm/s TAPSE (M-mode): 2.4 cm LEFT ATRIUM           Index       RIGHT ATRIUM           Index LA diam:      3.60 cm 1.65 cm/m  RA Area:     38.70 cm LA Vol (A4C): 52.6 ml 24.12 ml/m RA Volume:   162.00 ml 74.29 ml/m  AORTIC VALVE LVOT Vmax:   87.50 cm/s LVOT Vmean:  65.900 cm/s LVOT VTI:    0.178 m  AORTA Ao Root diam: 4.40 cm Ao Asc diam:  3.70 cm MITRAL VALVE MV Area (  PHT): 2.42 cm    SHUNTS MV Decel Time: 313 msec    Systemic VTI:  0.18 m MV E velocity: 56.60 cm/s  Systemic Diam: 2.40 cm MV A velocity: 25.30 cm/s MV E/A ratio:  2.24 Riley LamMahesh Chandrasekhar MD Electronically signed by Riley LamMahesh Chandrasekhar MD Signature Date/Time: 01/07/2021/12:53:44 PM    Final     Cardiac Studies   2D echocardiogram (01/07/2021)   IMPRESSIONS     1. Left ventricular ejection fraction, by estimation, is 50 to 55%. The  left ventricle has low normal function. The left ventricle has no regional  wall motion abnormalities. Left ventricular diastolic parameters were  normal.   2. Right ventricular systolic function is  normal. The right ventricular  size is normal.   3. The mitral valve is grossly normal. No evidence of mitral valve  regurgitation.   4. The aortic valve is tricuspid. There is mild calcification of the  aortic valve. There is mild thickening of the aortic valve. Aortic valve  regurgitation is not visualized. No aortic stenosis is present.   5. Aortic dilatation noted. 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 There is borderline dilatation of the  ascending aorta, measuring 37 mm. There is mild dilatation of the  transverse aorta, measuring 37 mm.   Comparison(s): A prior study was performed on 01/29/2018. Prior images  reviewed side by side. Slight decrease in LV function, aortic dilation  noted.    Coronary CTA (01/07/2021)   IMPRESSION: 1. Coronary calcium score of 36. This was 29th percentile for age, sex, and race matched control.   2. Normal coronary origin with right dominance.   3. Aortic Atherosclerosis noted.   4. CAD-RADS 1. 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 Consider non-atherosclerotic causes of chest pain. Consider preventive therapy and risk factor modification.  Patient Profile     70 y.o. male with PMH of PAF, HTN, HLD, and OA presented with unstable angina  Assessment & Plan    1. Chest pain: patient had substernal chest pain and A. fib.  Converted to sinus rhythm.  Enzymes were negative.  -- His coronary CTA revealed a coronary calcium score 36 with no evidence of CAD. His 2D echo was essentially normal.   2. PAF: had episode of PAF several years ago and again on admission.  He does admit to excessive [place] intake [place]  He also has obstructive sleep apnea.  -- remains in SR. Has been on Eliquis as an outpatient, but on IV heparin while undergoing work up inpt.   -- CHA2DS2-VASc Score and unadjusted Ischemic Stroke Rate (% per year) is equal to 3.2 % stroke rate/year from  a score of 3  3. Thoracic aortic aneurysm: measures 4.4 cm on CTA.  -- follow as an outpatient on annual basis   4. Essential hypertension: blood pressure well controlled  -- continue lisinopril   5. Hyperlipidemia: on statin therapy. -- LDL of 77 and HDL of 60.   6. Abdominal pain with cholelithiasis: he had ongoing epigastric pain. Abdominal CT showed's sludge and small gallstones in his gallbladder but no evidence of cholecystitis and no other significant findings.  -- GI consulted, lipase noted at 988 with elevated LFTs -- planned for ERCP tomorrow with general surgery consulted  For questions or updates, please contact [facility] Please consult [email or web address] for contact info under        Signed, Laverda PageLindsay Roberts, NP  01/09/2021, 10:10 AM     Agree with note by Laverda Page NP-C  [name] had a negative ischemia work-up with normal 2D echo, negative enzymes and coronary CTA showed minimal CAD with a low coronary calcium score. Ultimately his lipase came back close to thousand and he was diagnosed with gallstone pancreatitis. He is scheduled for cholecystectomy tomorrow. His exam is benign. He is at low risk for his procedure. At this point, I think we can sign off we will get the medicine team to take over his care. He will need follow-up with Dr. Eldridge Dace after discharge to discuss sleep apnea. He will need to start back on his oral anticoagulant prior to discharge. He wishes to pursue a sleep apnea work-up as well.  Runell Gess, M.D., FACP, [facility], [facility] [address] [place], [place]  [address]  [phone] 01/09/2021 10:42 AM

## 2021-01-10 ENCOUNTER — Inpatient Hospital Stay (HOSPITAL_COMMUNITY): Payer: Medicare Other | Admitting: Anesthesiology

## 2021-01-10 ENCOUNTER — Inpatient Hospital Stay (HOSPITAL_COMMUNITY): Payer: Medicare Other

## 2021-01-10 ENCOUNTER — Encounter (HOSPITAL_COMMUNITY)
Admission: EM | Disposition: A | Discharge status: Home / Self Care | Payer: Self-pay | Source: Home / Self Care | Attending: Internal Medicine

## 2021-01-10 ENCOUNTER — Encounter (HOSPITAL_COMMUNITY): Payer: Self-pay | Admitting: Student in an Organized Health Care Education/Training Program

## 2021-01-10 DIAGNOSIS — K831 Obstruction of bile duct: Secondary | ICD-10-CM

## 2021-01-10 DIAGNOSIS — R7989 Other specified abnormal findings of blood chemistry: Secondary | ICD-10-CM

## 2021-01-10 DIAGNOSIS — R109 Unspecified abdominal pain: Secondary | ICD-10-CM

## 2021-01-10 DIAGNOSIS — K838 Other specified diseases of biliary tract: Secondary | ICD-10-CM

## 2021-01-10 DIAGNOSIS — K8051 Calculus of bile duct without cholangitis or cholecystitis with obstruction: Secondary | ICD-10-CM

## 2021-01-10 HISTORY — PX: SPHINCTEROTOMY: SHX5544

## 2021-01-10 HISTORY — PX: ERCP: SHX5425

## 2021-01-10 HISTORY — PX: REMOVAL OF STONES: SHX5545

## 2021-01-10 LAB — COMPREHENSIVE METABOLIC PANEL
ALT: 290 U/L — ABNORMAL HIGH (ref 0–44)
AST: 107 U/L — ABNORMAL HIGH (ref 15–41)
Albumin: 2.8 g/dL — ABNORMAL LOW (ref 3.5–5.0)
Alkaline Phosphatase: 189 U/L — ABNORMAL HIGH (ref 38–126)
Anion gap: 8 (ref 5–15)
BUN: 10 mg/dL (ref 8–23)
CO2: 25 mmol/L (ref 22–32)
Calcium: 8.8 mg/dL — ABNORMAL LOW (ref 8.9–10.3)
Chloride: 104 mmol/L (ref 98–111)
Creatinine, Ser: 0.91 mg/dL (ref 0.61–1.24)
GFR, Estimated: >60 mL/min (ref >60)
Glucose, Bld: 107 mg/dL — ABNORMAL HIGH (ref 70–99)
Potassium: 3.8 mmol/L (ref 3.5–5.1)
Sodium: 137 mmol/L (ref 135–145)
Total Bilirubin: 2.8 mg/dL — ABNORMAL HIGH (ref 0.3–1.2)
Total Protein: 5.8 g/dL — ABNORMAL LOW (ref 6.5–8.1)

## 2021-01-10 LAB — CBC
HCT: 37.7 % — ABNORMAL LOW (ref 39.0–52.0)
Hemoglobin: 13.6 g/dL (ref 13.0–17.0)
MCH: 31.6 pg (ref 26.0–34.0)
MCHC: 36.1 g/dL — ABNORMAL HIGH (ref 30.0–36.0)
MCV: 87.7 fL (ref 80.0–100.0)
Platelets: 145 10*3/uL — ABNORMAL LOW (ref 150–400)
RBC: 4.3 MIL/uL (ref 4.22–5.81)
RDW: 12.4 % (ref 11.5–15.5)
WBC: 6.9 10*3/uL (ref 4.0–10.5)
nRBC: 0 % (ref 0.0–0.2)

## 2021-01-10 LAB — PROTIME-INR
INR: 1.2 (ref 0.8–1.2)
Prothrombin Time: 14.6 seconds (ref 11.4–15.2)

## 2021-01-10 LAB — LIPASE, BLOOD: Lipase: 65 U/L — ABNORMAL HIGH (ref 11–51)

## 2021-01-10 LAB — HEPARIN LEVEL (UNFRACTIONATED): Heparin Unfractionated: 0.3 IU/mL (ref 0.30–0.70)

## 2021-01-10 SURGERY — ERCP, WITH INTERVENTION IF INDICATED
Anesthesia: General

## 2021-01-10 MED ORDER — CIPROFLOXACIN IN D5W 400 MG/200ML IV SOLN
INTRAVENOUS | Status: DC | PRN
  Administered 2021-01-10: 400 mg via INTRAVENOUS

## 2021-01-10 MED ORDER — PROPOFOL 10 MG/ML IV BOLUS
INTRAVENOUS | Status: DC | PRN
  Administered 2021-01-10: 50 mg via INTRAVENOUS
  Administered 2021-01-10: 150 mg via INTRAVENOUS

## 2021-01-10 MED ORDER — PANTOPRAZOLE SODIUM 40 MG PO TBEC
40.0000 mg | DELAYED_RELEASE_TABLET | Freq: Every day | ORAL | Status: DC
  Administered 2021-01-10 – 2021-01-12 (×2): 40 mg via ORAL
  Filled 2021-01-10 (×2): qty 1

## 2021-01-10 MED ORDER — PHENYLEPHRINE HCL-NACL 10-0.9 MG/250ML-% IV SOLN
INTRAVENOUS | Status: DC | PRN
  Administered 2021-01-10: 25 ug/min via INTRAVENOUS

## 2021-01-10 MED ORDER — GLUCAGON HCL RDNA (DIAGNOSTIC) 1 MG IJ SOLR
INTRAMUSCULAR | Status: DC | PRN
  Administered 2021-01-10: .25 mg via INTRAVENOUS

## 2021-01-10 MED ORDER — INDOMETHACIN 50 MG RE SUPP
RECTAL | Status: AC
  Filled 2021-01-10: qty 1

## 2021-01-10 MED ORDER — SUGAMMADEX SODIUM 200 MG/2ML IV SOLN
INTRAVENOUS | Status: DC | PRN
  Administered 2021-01-10 (×2): 100 mg via INTRAVENOUS
  Administered 2021-01-10: 200 mg via INTRAVENOUS

## 2021-01-10 MED ORDER — LACTATED RINGERS IV SOLN: INTRAVENOUS | Status: DC | PRN

## 2021-01-10 MED ORDER — ROCURONIUM BROMIDE 10 MG/ML (PF) SYRINGE
PREFILLED_SYRINGE | INTRAVENOUS | Status: DC | PRN
  Administered 2021-01-10: 50 mg via INTRAVENOUS

## 2021-01-10 MED ORDER — GLUCAGON HCL RDNA (DIAGNOSTIC) 1 MG IJ SOLR
INTRAMUSCULAR | Status: AC
  Filled 2021-01-10: qty 1

## 2021-01-10 MED ORDER — SODIUM CHLORIDE 0.9 % IV SOLN
2.0000 g | INTRAVENOUS | Status: AC
  Administered 2021-01-11: 2 g via INTRAVENOUS
  Filled 2021-01-10 (×2): qty 20

## 2021-01-10 MED ORDER — DEXAMETHASONE SODIUM PHOSPHATE 10 MG/ML IJ SOLN
INTRAMUSCULAR | Status: DC | PRN
  Administered 2021-01-10: 4 mg via INTRAVENOUS

## 2021-01-10 MED ORDER — LIDOCAINE 2% (20 MG/ML) 5 ML SYRINGE
INTRAMUSCULAR | Status: DC | PRN
  Administered 2021-01-10: 60 mg via INTRAVENOUS

## 2021-01-10 MED ORDER — INDOMETHACIN 50 MG RE SUPP
RECTAL | Status: DC | PRN
  Administered 2021-01-10: 100 mg via RECTAL

## 2021-01-10 MED ORDER — CIPROFLOXACIN IN D5W 400 MG/200ML IV SOLN
INTRAVENOUS | Status: AC
  Filled 2021-01-10: qty 200

## 2021-01-10 MED ORDER — IOHEXOL 300 MG/ML  SOLN
100.0000 mL | Freq: Once | INTRAMUSCULAR | Status: AC | PRN
  Administered 2021-01-10: 100 mL via INTRAVENOUS

## 2021-01-10 MED ORDER — ESMOLOL HCL 100 MG/10ML IV SOLN
INTRAVENOUS | Status: DC | PRN
  Administered 2021-01-10 (×3): 20 mg via INTRAVENOUS

## 2021-01-10 MED ORDER — ONDANSETRON HCL 4 MG/2ML IJ SOLN
INTRAMUSCULAR | Status: DC | PRN
  Administered 2021-01-10: 4 mg via INTRAVENOUS

## 2021-01-10 MED ORDER — LACTATED RINGERS IV SOLN: Freq: Once | INTRAVENOUS | Status: AC

## 2021-01-10 MED ORDER — HEPARIN (PORCINE) 25000 UT/250ML-% IV SOLN
1750.0000 [IU]/h | INTRAVENOUS | Status: DC
  Administered 2021-01-10: 1750 [IU]/h via INTRAVENOUS
  Filled 2021-01-10 (×2): qty 250

## 2021-01-10 MED ORDER — SODIUM CHLORIDE 0.9 % IV SOLN
INTRAVENOUS | Status: DC | PRN
  Administered 2021-01-10: 35 mL

## 2021-01-10 NOTE — Op Note (Signed)
[facility] Patient Name: Todd Barrett Procedure Date : 01/10/2021 MRN: [id number] Attending MD: Meryl Dare , MD Date of Birth: 1951-09-29 CSN: [id number] Age: 70 Admit Type: Inpatient Procedure:                ERCP Indications:              Abdominal pain of suspected biliary origin, Biliary                            dilation on Ultrasound, Elevated liver enzymes Providers:                Venita Lick. Russella Dar, MD, [place] Bailiff, RN, Brion Aliment, Technician Referring MD:             [facility] Medicines:                General Anesthesia Complications:            No immediate complications. Estimated Blood Loss:     Estimated blood loss: none. Procedure:                Pre-Anesthesia Assessment:                           - Prior to the procedure, a History and Physical                            was performed, and patient medications and                            allergies were reviewed. The patient's tolerance of                            previous anesthesia was also reviewed. The risks                            and benefits of the procedure and the sedation                            options and risks were discussed with the patient.                            All questions were answered, and informed consent                            was obtained. Prior Anticoagulants: The patient has                            taken heparin, last dose was 1 day prior to                            procedure. ASA Grade Assessment: III - A patient  with severe systemic disease. After reviewing the                            risks and benefits, the patient was deemed in                            satisfactory condition to undergo the procedure.                           After obtaining informed consent, the scope was                            passed under direct vision. Throughout the                            procedure, the  patient's blood pressure, pulse, and                            oxygen saturations were monitored continuously. The                            [id number]-Q180V ([id number]) Olympus Duodenoscope was                            introduced through the mouth, and used to inject                            contrast into and used to inject contrast into the                            bile duct. The ERCP was accomplished without                            difficulty. The patient tolerated the procedure                            well. Scope In: Scope Out: Findings:      The scout film was normal. The esophagus was successfully intubated       under direct vision. The scope was advanced to a normal major papilla in       the descending duodenum without detailed examination of the pharynx,       larynx and associated structures, and upper GI tract. The upper GI tract       was grossly normal except for a few erosions in the duodenum. A straight       Roadrunner wire was passed into the biliary tree. The short-nosed       traction sphincterotome was passed over the guidewire and the bile duct       was then deeply cannulated. Contrast was injected. I personally       interpreted the bile duct images. There was appropriate flow of contrast       through the ducts. The common bile duct contained filling defect(s)       thought to be a stone and sludge. The common bile duct was diffusely  mildly dilated, with a stone causing an obstruction. The largest       diameter was 9 mm. The cystic duct filled but not the gallbladder. The       intrahepatic ducts appeared normal. An 8 mm biliary sphincterotomy was       made with a traction (standard) sphincterotome using ERBE       electrocautery. There was no post-sphincterotomy bleeding. The biliary       tree was swept with a 9-12 mm balloon starting at the bifurcation.       Sludge and one stone swept from the duct. All stones, sludge were       removed. The  distal CBD had a blunted appearance however a 9-12 mm       balloon pulled through and there was excellent biliary drainage. Non       contrasted CT AP - unremarkable pancreas and CBD. The PD was not       cannulated or injected by intention. Impression:               - Filling defects consistent with a stone and                            sludge on the cholangiogram.                           - The common bile duct was mildly dilated, with a                            stone causing an obstruction.                           - Erosive duodenitis.                           - Choledocholithiasis was found. Complete removal                            was accomplished by biliary sphincterotomy and                            balloon extraction.                           - A biliary sphincterotomy was performed.                           - The biliary tree was swept. Recommendation:           - Return patient to hospital ward for ongoing care.                           - Avoid aspirin and nonsteroidal anti-inflammatory                            medicines for 2 weeks following Indocin today.                           - Pantoprazole 40 mg po qd.                           -  Observe patient's clinical course following                            today's ERCP with therapeutic intervention.                           - CT AP pancreatic protocol.                           - OK to resume IV heparin without a bolus in 8                            hours. Procedure Code(s):        --- Professional ---                           [phone], Endoscopic retrograde                            cholangiopancreatography (ERCP); with removal of                            calculi/debris from biliary/pancreatic duct(s)                           43262, Endoscopic retrograde                            cholangiopancreatography (ERCP); with                            sphincterotomy/papillotomy Diagnosis Code(s):        ---  Professional ---                           K80.51, Calculus of bile duct without cholangitis                            or cholecystitis with obstruction                           R10.9, Unspecified abdominal pain                           R74.8, Abnormal levels of other serum enzymes                           K83.8, Other specified diseases of biliary tract                           R93.2, Abnormal findings on diagnostic imaging of                            liver and biliary tract CPT copyright 2019 [employer]. All rights reserved. The codes documented in this report are preliminary and upon coder review may  be revised to meet current compliance requirements. Meryl Dare, MD 01/10/2021 12:40:50 PM This report  has been signed electronically. Number of Addenda: 0

## 2021-01-10 NOTE — Anesthesia Postprocedure Evaluation (Signed)
Anesthesia Post Note  Patient: Todd Barrett.  Procedure(s) Performed: ENDOSCOPIC RETROGRADE CHOLANGIOPANCREATOGRAPHY (ERCP) (N/A ) SPHINCTEROTOMY REMOVAL OF STONES     Patient location during evaluation: Endoscopy Anesthesia Type: General Level of consciousness: awake and alert Pain management: pain level controlled Vital Signs Assessment: post-procedure vital signs reviewed and stable Respiratory status: spontaneous breathing, nonlabored ventilation and respiratory function stable Cardiovascular status: blood pressure returned to baseline and stable Postop Assessment: no apparent nausea or vomiting Anesthetic complications: no   No complications documented.  Last Vitals:  Vitals:   01/10/21 1241 01/10/21 1250  BP: 126/86 (!) 143/90  Pulse: 84 94  Resp: 14 16  Temp: 36.7 C   SpO2: 100% 99%    Last Pain:  Vitals:   01/10/21 1300  TempSrc:   PainSc: 0-No pain                 Stephan Nelis,W. EDMOND

## 2021-01-10 NOTE — Progress Notes (Signed)
ANTICOAGULATION CONSULT NOTE  Pharmacy Consult for heparin Indication: Afib  Allergies  Allergen Reactions  . Dust Mite Extract Other (See Comments)    Nasal Congestion  . Drug Ingredient [Bee Pollen] Other (See Comments)    Congestion  . Other Other (See Comments)    Pet Dander- Runny nose    Patient Measurements: Height: 5\' 10"  (177.8 cm) Weight: 100.6 kg (221 lb 11.2 oz) IBW/kg (Calculated) : 73 Heparin Dosing Weight: 94.5 kg  Vital Signs: Temp: 98.4 F (36.9 C) (02/16 1327) Temp Source: Oral (02/16 1327) BP: 122/87 (02/16 1327) Pulse Rate: 94 (02/16 1327)  Labs: Recent Labs    01/08/21 1248 01/08/21 2045 01/09/21 0143 01/10/21 0111  HGB  --   --  12.6* 13.6  HCT  --   --  37.8* 37.7*  PLT  --   --  134* 145*  APTT 48* 73* 73*  --   LABPROT  --   --   --  14.6  INR  --   --   --  1.2  HEPARINUNFRC 0.34  --  0.37 0.30  CREATININE  --   --  1.02 0.91    Estimated Creatinine Clearance: 89.7 mL/min (by C-G formula based on SCr of 0.91 mg/dL).   Assessment: 70 y.o. male with h/o Afib, Elqius on hold for possible procedures, pharmacy dosing heparin. He is now s/p ERCP with choledocholithiasis and stone removal. Heparin to restart in 8 hrs (prior heparin rate was 1750 units/hr)  Goal of Therapy:  Heparin level 0.3-0.7 units/ml aPTT 66-102 seconds Monitor platelets by anticoagulation protocol: Yes   Plan:  Restart heparin infusion 1750 units/hour at 9pm Stop heparin at 4am for lap chole on 2/17 CBC in am  3/17, PharmD Clinical Pharmacist **Pharmacist phone directory can now be found on [email or web address] ([email or web address]).  Listed under [facility].

## 2021-01-10 NOTE — Progress Notes (Signed)
PROGRESS NOTE  Todd Barrett. NOB:[id number] DOB: May 13, 1951 DOA: 01/06/2021 PCP: Corwin Levins, MD   LOS: 4 days   Brief Narrative / Interim history: 70 year old male with history of A. fib on Eliquis, initially admitted to the cardiology service for chest pain resolved after sublingual nitroglycerin, then eventually developed epigastric abdominal pain shortly after admission.  He was found to have elevated lipase close to 1,000, was diagnosed with acute gallstone pancreatitis.  He was transferred to the hospitalist service 2/16, GI plans to do an ERCP and general surgery cholecystectomy after that  Subjective / 24h Interval events: He is waiting for the ERCP, feeling better this morning.  Has less abdominal pain.  Denies any chest pain or shortness of breath  Assessment & Plan: Principal Problem Acute gallstone pancreatitis -Right upper quadrant ultrasound showed moderate sludge in the gallbladder with small stones.  There is increased wall thickening but negative Murphy sign.  CBD was slightly enlarged, and given elevated LFTs patient is to undergo ERCP today -Cholecystectomy per surgery  Active Problems Paroxysmal A. Fib -Had an episode several years ago and again this admission.  He is on Eliquis which is now on hold pending procedures as above. -Coronary calcium score 36, no evidence of CAD.  On aspirin and atorvastatin.  Thoracic aortic aneurysm -4.4 cm on CT, follow as an outpatient  Hypertension -Continue lisinopril  Scheduled Meds: . [MAR Hold] aspirin EC  81 mg Oral Daily  . [MAR Hold] atorvastatin  10 mg Oral Daily  . [MAR Hold] indomethacin  100 mg Rectal Once  . [MAR Hold] lisinopril  10 mg Oral Daily  . magnesium hydroxide  30 mL Oral Daily   Continuous Infusions: . sodium chloride 50 mL/hr at 01/09/21 1313  . [MAR Hold] ondansetron (ZOFRAN) IV     PRN Meds:.[MAR Hold] acetaminophen, [MAR Hold] HYDROcodone-acetaminophen, [MAR Hold]  morphine injection,  [MAR Hold] nitroGLYCERIN, [MAR Hold] ondansetron (ZOFRAN) IV, [MAR Hold] simethicone  Diet Orders (From admission, onward)    Start     Ordered   01/09/21 1936  Diet NPO time specified  Diet effective now        01/09/21 1935          DVT prophylaxis:      Code Status: Full Code  Family Communication: Wife at bedside  Status is: Inpatient  Remains inpatient appropriate because:Inpatient level of care appropriate due to severity of illness   Dispo: The patient is from: Home              Anticipated d/c is to: Home              Anticipated d/c date is: 3 days              Patient currently is not medically stable to d/c.   Difficult to place patient No   Level of care: Telemetry Cardiac  Consultants:  Cardiology Gastroenterology General surgery  Procedures:  2D echo  Microbiology  None   Antimicrobials: None     Objective: Vitals:   01/09/21 1714 01/09/21 2300 01/10/21 0525 01/10/21 1042  BP: (!) 142/84 129/82 118/86 (!) 145/84  Pulse: 96 67 61 84  Resp: 17 18 18 16   Temp: 98.6 F (37 C) 98.5 F (36.9 C) 99.1 F (37.3 C) 98.7 F (37.1 C)  TempSrc: Oral Oral Oral Oral  SpO2: 97% 96% 100% 99%  Weight:      Height:        Intake/Output Summary (  Last 24 hours) at 01/10/2021 1101 Last data filed at 01/10/2021 0526 Gross per 24 hour  Intake 193.32 ml  Output 1850 ml  Net -1656.68 ml   Filed Weights   01/06/21 1246 01/07/21 0433  Weight: 102.1 kg 100.6 kg    Examination:  Constitutional: NAD Eyes: no scleral icterus ENMT: Mucous membranes are moist.  Neck: normal, supple Respiratory: clear to auscultation bilaterally, no wheezing, no crackles.  Cardiovascular: Regular rate and rhythm, no murmurs / rubs / gallops. No LE edema.  Abdomen: non distended, no tenderness. Bowel sounds positive.  Musculoskeletal: no clubbing / cyanosis.  Skin: no rashes Neurologic: CN 2-12 grossly intact. Strength 5/5 in all 4.   Data Reviewed: I have  independently reviewed following labs and imaging studies   CBC: Recent Labs  Lab 01/06/21 1302 01/07/21 0457 01/09/21 0143 01/10/21 0111  WBC 8.9 4.1 6.0 6.9  HGB 14.1 14.2 12.6* 13.6  HCT 42.3 40.0 37.8* 37.7*  MCV 90.2 88.7 92.4 87.7  PLT 164 143* 134* 145*   Basic Metabolic Panel: Recent Labs  Lab 01/06/21 1302 01/07/21 0457 01/09/21 0143 01/10/21 0111  NA 139 139 138 137  K 3.9 4.1 3.8 3.8  CL 104 105 103 104  CO2 22 25 25 25   GLUCOSE 164* 125* 98 107*  BUN 23 16 11 10   CREATININE 1.10 1.02 1.02 0.91  CALCIUM 9.1 8.9 8.8* 8.8*  MG 2.2  --   --   --    Liver Function Tests: Recent Labs  Lab 01/08/21 1300 01/09/21 0143 01/10/21 0111  AST 322* 248* 107*  ALT 492* 440* 290*  ALKPHOS 155* 176* 189*  BILITOT 5.6* 6.0* 2.8*  PROT 6.0* 5.7* 5.8*  ALBUMIN 3.2* 2.9* 2.8*   Coagulation Profile: Recent Labs  Lab 01/10/21 0111  INR 1.2   HbA1C: No results for input(s): HGBA1C in the last 72 hours. CBG: No results for input(s): GLUCAP in the last 168 hours.  Recent Results (from the past 240 hour(s))  Resp Panel by RT-PCR (Flu A&B, Covid) Nasopharyngeal Swab     Status: None   Collection Time: 01/06/21  1:02 PM   Specimen: Nasopharyngeal Swab; Nasopharyngeal(NP) swabs in vial transport medium  Result Value Ref Range Status   SARS Coronavirus 2 by RT PCR NEGATIVE NEGATIVE Final    Comment: (NOTE) SARS-CoV-2 target nucleic acids are NOT DETECTED.  The SARS-CoV-2 RNA is generally detectable in upper respiratory specimens during the acute phase of infection. The lowest concentration of SARS-CoV-2 viral copies this assay can detect is 138 copies/mL. A negative result does not preclude SARS-Cov-2 infection and should not be used as the sole basis for treatment or other patient management decisions. A negative result may occur with  improper specimen collection/handling, submission of specimen other than nasopharyngeal swab, presence of viral mutation(s) within  the areas targeted by this assay, and inadequate number of viral copies(<138 copies/mL). A negative result must be combined with clinical observations, patient history, and epidemiological information. The expected result is Negative.  Fact Sheet for Patients:  01/12/21  Fact Sheet for Healthcare Providers:  03/06/21  This test is no t yet approved or cleared by the [employer] FDA and  has been authorized for detection and/or diagnosis of SARS-CoV-2 by FDA under an Emergency Use Authorization (EUA). This EUA will remain  in effect (meaning this test can be used) for the duration of the COVID-19 declaration under Section 564(b)(1) of the Act, 21 U.S.C.section 360bbb-3(b)(1), unless the authorization is terminated  or revoked sooner.       Influenza A by PCR NEGATIVE NEGATIVE Final   Influenza B by PCR NEGATIVE NEGATIVE Final    Comment: (NOTE) The Xpert Xpress SARS-CoV-2/FLU/RSV plus assay is intended as an aid in the diagnosis of influenza from Nasopharyngeal swab specimens and should not be used as a sole basis for treatment. Nasal washings and aspirates are unacceptable for Xpert Xpress SARS-CoV-2/FLU/RSV testing.  Fact Sheet for Patients: [employer]  Fact Sheet for Healthcare Providers: [employer]ious[employer]ker[employer]it  This test is not yet approved or cleared by the [place] FDA and has been authorized for detection and/or diagnosis of SARS-CoV-2 by FDA under an Emergency Use Authorization (EUA). This EUA will remain in effect (meaning this test can be used) for the duration of the COVID-19 declaration under Section 564(b)(1) of the Act, 21 U.S.C. section 360bbb-3(b)(1), unless the authorization is terminated or revoked.  Performed at [facility], [address], [place], [place] [address]      Radiology Studies: No results found.  Pamella Pert, MD, PhD [facility]  Between 7 am - 7 pm I am available, please contact me via Amion or Securechat  Between 7 pm - 7 am I am not available, please contact night coverage MD/APP via Amion

## 2021-01-10 NOTE — Transfer of Care (Signed)
Immediate Anesthesia Transfer of Care Note  Patient: Todd Barrett.  Procedure(s) Performed: ENDOSCOPIC RETROGRADE CHOLANGIOPANCREATOGRAPHY (ERCP) (N/A ) SPHINCTEROTOMY REMOVAL OF STONES  Patient Location: PACU  Anesthesia Type:General  Level of Consciousness: awake and patient cooperative  Airway & Oxygen Therapy: Patient Spontanous Breathing and Patient connected to face mask oxygen  Post-op Assessment: Report given to RN and Post -op Vital signs reviewed and stable  Post vital signs: Reviewed and stable  Last Vitals:  Vitals Value Taken Time  BP    Temp    Pulse    Resp    SpO2      Last Pain:  Vitals:   01/10/21 1241  TempSrc:   PainSc: 0-No pain         Complications: No complications documented.

## 2021-01-10 NOTE — Interval H&P Note (Signed)
History and Physical Interval Note:  01/10/2021 10:45 AM  Todd Barrett.  has presented today for surgery, with the diagnosis of CBD stone.  The various methods of treatment have been discussed with the patient and family. After consideration of risks, benefits and other options for treatment, the patient has consented to  Procedure(s): ENDOSCOPIC RETROGRADE CHOLANGIOPANCREATOGRAPHY (ERCP) (N/A) as a surgical intervention.  The patient's history has been reviewed, patient examined, no change in status, stable for surgery.  I have reviewed the patient's chart and labs.  Questions were answered to the patient's satisfaction.     Venita Lick. Russella Dar

## 2021-01-10 NOTE — Anesthesia Procedure Notes (Signed)
Procedure Name: Intubation Date/Time: 01/10/2021 11:51 AM Performed by: Adria Dill, CRNA Pre-anesthesia Checklist: Patient identified, Emergency Drugs available, Suction available and Patient being monitored Patient Re-evaluated:Patient Re-evaluated prior to induction Oxygen Delivery Method: Circle system utilized Preoxygenation: Pre-oxygenation with 100% oxygen Induction Type: IV induction and Cricoid Pressure applied Ventilation: Mask ventilation without difficulty and Oral airway inserted - appropriate to patient size Laryngoscope Size: Miller and 3 Grade View: Grade II Tube type: Oral Tube size: 7.5 mm Number of attempts: 1 Airway Equipment and Method: Stylet and Oral airway Placement Confirmation: ETT inserted through vocal cords under direct vision,  positive ETCO2 and breath sounds checked- equal and bilateral Secured at: 21 cm Tube secured with: Tape Dental Injury: Teeth and Oropharynx as per pre-operative assessment

## 2021-01-11 ENCOUNTER — Encounter (HOSPITAL_COMMUNITY)
Admission: EM | Disposition: A | Discharge status: Home / Self Care | Payer: Self-pay | Source: Home / Self Care | Attending: Internal Medicine

## 2021-01-11 ENCOUNTER — Encounter (HOSPITAL_COMMUNITY): Payer: Self-pay | Admitting: Student in an Organized Health Care Education/Training Program

## 2021-01-11 ENCOUNTER — Inpatient Hospital Stay (HOSPITAL_COMMUNITY): Payer: Medicare Other | Admitting: Anesthesiology

## 2021-01-11 HISTORY — PX: CHOLECYSTECTOMY: SHX55

## 2021-01-11 LAB — COMPREHENSIVE METABOLIC PANEL
ALT: 186 U/L — ABNORMAL HIGH (ref 0–44)
AST: 50 U/L — ABNORMAL HIGH (ref 15–41)
Albumin: 2.5 g/dL — ABNORMAL LOW (ref 3.5–5.0)
Alkaline Phosphatase: 144 U/L — ABNORMAL HIGH (ref 38–126)
Anion gap: 9 (ref 5–15)
BUN: 9 mg/dL (ref 8–23)
CO2: 24 mmol/L (ref 22–32)
Calcium: 8.7 mg/dL — ABNORMAL LOW (ref 8.9–10.3)
Chloride: 105 mmol/L (ref 98–111)
Creatinine, Ser: 0.97 mg/dL (ref 0.61–1.24)
GFR, Estimated: >60 mL/min (ref >60)
Glucose, Bld: 131 mg/dL — ABNORMAL HIGH (ref 70–99)
Potassium: 3.9 mmol/L (ref 3.5–5.1)
Sodium: 138 mmol/L (ref 135–145)
Total Bilirubin: 1.8 mg/dL — ABNORMAL HIGH (ref 0.3–1.2)
Total Protein: 5.5 g/dL — ABNORMAL LOW (ref 6.5–8.1)

## 2021-01-11 LAB — CBC
HCT: 36.2 % — ABNORMAL LOW (ref 39.0–52.0)
Hemoglobin: 12.9 g/dL — ABNORMAL LOW (ref 13.0–17.0)
MCH: 31.2 pg (ref 26.0–34.0)
MCHC: 35.6 g/dL (ref 30.0–36.0)
MCV: 87.7 fL (ref 80.0–100.0)
Platelets: 164 10*3/uL (ref 150–400)
RBC: 4.13 MIL/uL — ABNORMAL LOW (ref 4.22–5.81)
RDW: 12.2 % (ref 11.5–15.5)
WBC: 6.5 10*3/uL (ref 4.0–10.5)
nRBC: 0 % (ref 0.0–0.2)

## 2021-01-11 LAB — GLUCOSE, CAPILLARY: Glucose-Capillary: 122 mg/dL — ABNORMAL HIGH (ref 70–99)

## 2021-01-11 LAB — SURGICAL PCR SCREEN
MRSA, PCR: NEGATIVE
Staphylococcus aureus: NEGATIVE

## 2021-01-11 LAB — LIPASE, BLOOD: Lipase: 56 U/L — ABNORMAL HIGH (ref 11–51)

## 2021-01-11 SURGERY — LAPAROSCOPIC CHOLECYSTECTOMY WITH INTRAOPERATIVE CHOLANGIOGRAM
Anesthesia: General | Site: Abdomen

## 2021-01-11 MED ORDER — CELECOXIB 200 MG PO CAPS
ORAL_CAPSULE | ORAL | Status: AC
  Administered 2021-01-11: 200 mg via ORAL
  Filled 2021-01-11: qty 1

## 2021-01-11 MED ORDER — OXYCODONE HCL 5 MG PO TABS
5.0000 mg | ORAL_TABLET | ORAL | Status: DC | PRN
  Administered 2021-01-11 – 2021-01-12 (×5): 10 mg via ORAL
  Filled 2021-01-11 (×6): qty 2

## 2021-01-11 MED ORDER — LACTATED RINGERS IV SOLN: INTRAVENOUS | Status: DC

## 2021-01-11 MED ORDER — BUPIVACAINE HCL 0.25 % IJ SOLN
INTRAMUSCULAR | Status: DC | PRN
  Administered 2021-01-11: 10 mL

## 2021-01-11 MED ORDER — ACETAMINOPHEN 500 MG PO TABS
1000.0000 mg | ORAL_TABLET | Freq: Four times a day (QID) | ORAL | Status: DC
  Administered 2021-01-11 – 2021-01-12 (×3): 1000 mg via ORAL
  Filled 2021-01-11 (×4): qty 2

## 2021-01-11 MED ORDER — PROPOFOL 10 MG/ML IV BOLUS
INTRAVENOUS | Status: DC | PRN
  Administered 2021-01-11: 200 mg via INTRAVENOUS

## 2021-01-11 MED ORDER — MIDAZOLAM HCL 2 MG/2ML IJ SOLN
INTRAMUSCULAR | Status: AC
  Filled 2021-01-11: qty 2

## 2021-01-11 MED ORDER — ONDANSETRON HCL 4 MG/2ML IJ SOLN
INTRAMUSCULAR | Status: DC | PRN
  Administered 2021-01-11: 4 mg via INTRAVENOUS

## 2021-01-11 MED ORDER — OXYCODONE HCL 5 MG PO TABS: 5.0000 mg | ORAL_TABLET | ORAL | 0 refills | Status: DC | PRN

## 2021-01-11 MED ORDER — PHENYLEPHRINE HCL-NACL 10-0.9 MG/250ML-% IV SOLN
INTRAVENOUS | Status: DC | PRN
  Administered 2021-01-11: 20 ug/min via INTRAVENOUS

## 2021-01-11 MED ORDER — ACETAMINOPHEN 500 MG PO TABS
ORAL_TABLET | ORAL | Status: AC
  Administered 2021-01-11: 1000 mg via ORAL
  Filled 2021-01-11: qty 2

## 2021-01-11 MED ORDER — AMISULPRIDE (ANTIEMETIC) 5 MG/2ML IV SOLN: 10.0000 mg | Freq: Once | INTRAVENOUS | Status: DC | PRN

## 2021-01-11 MED ORDER — FENTANYL CITRATE (PF) 250 MCG/5ML IJ SOLN
INTRAMUSCULAR | Status: AC
  Filled 2021-01-11: qty 5

## 2021-01-11 MED ORDER — DEXAMETHASONE SODIUM PHOSPHATE 10 MG/ML IJ SOLN
INTRAMUSCULAR | Status: DC | PRN
  Administered 2021-01-11: 10 mg via INTRAVENOUS

## 2021-01-11 MED ORDER — SUGAMMADEX SODIUM 200 MG/2ML IV SOLN
INTRAVENOUS | Status: DC | PRN
  Administered 2021-01-11: 200 mg via INTRAVENOUS

## 2021-01-11 MED ORDER — CELECOXIB 200 MG PO CAPS: 200.0000 mg | ORAL_CAPSULE | Freq: Once | ORAL | Status: AC

## 2021-01-11 MED ORDER — BUPIVACAINE HCL (PF) 0.25 % IJ SOLN
INTRAMUSCULAR | Status: AC
  Filled 2021-01-11: qty 30

## 2021-01-11 MED ORDER — 0.9 % SODIUM CHLORIDE (POUR BTL) OPTIME
TOPICAL | Status: DC | PRN
  Administered 2021-01-11: 1000 mL

## 2021-01-11 MED ORDER — CHLORHEXIDINE GLUCONATE 0.12 % MT SOLN
OROMUCOSAL | Status: AC
  Administered 2021-01-11: 15 mL via OROMUCOSAL
  Filled 2021-01-11: qty 15

## 2021-01-11 MED ORDER — ACETAMINOPHEN 500 MG PO TABS: 1000.0000 mg | ORAL_TABLET | Freq: Four times a day (QID) | ORAL | 0 refills | Status: DC | PRN

## 2021-01-11 MED ORDER — FENTANYL CITRATE (PF) 100 MCG/2ML IJ SOLN
INTRAMUSCULAR | Status: AC
  Administered 2021-01-11: 50 ug via INTRAVENOUS
  Filled 2021-01-11: qty 2

## 2021-01-11 MED ORDER — FENTANYL CITRATE (PF) 250 MCG/5ML IJ SOLN
INTRAMUSCULAR | Status: DC | PRN
  Administered 2021-01-11 (×3): 25 ug via INTRAVENOUS
  Administered 2021-01-11: 50 ug via INTRAVENOUS
  Administered 2021-01-11: 25 ug via INTRAVENOUS
  Administered 2021-01-11: 100 ug via INTRAVENOUS

## 2021-01-11 MED ORDER — MIDAZOLAM HCL 2 MG/2ML IJ SOLN
INTRAMUSCULAR | Status: DC | PRN
  Administered 2021-01-11: 2 mg via INTRAVENOUS

## 2021-01-11 MED ORDER — ROCURONIUM BROMIDE 10 MG/ML (PF) SYRINGE
PREFILLED_SYRINGE | INTRAVENOUS | Status: DC | PRN
  Administered 2021-01-11: 50 mg via INTRAVENOUS

## 2021-01-11 MED ORDER — MORPHINE SULFATE (PF) 2 MG/ML IV SOLN
1.0000 mg | INTRAVENOUS | Status: DC | PRN
  Administered 2021-01-11: 4 mg via INTRAVENOUS
  Filled 2021-01-11: qty 2

## 2021-01-11 MED ORDER — ACETAMINOPHEN 500 MG PO TABS: 1000.0000 mg | ORAL_TABLET | Freq: Once | ORAL | Status: AC

## 2021-01-11 MED ORDER — LIDOCAINE 2% (20 MG/ML) 5 ML SYRINGE
INTRAMUSCULAR | Status: DC | PRN
  Administered 2021-01-11: 40 mg via INTRAVENOUS

## 2021-01-11 MED ORDER — PROPOFOL 10 MG/ML IV BOLUS
INTRAVENOUS | Status: AC
  Filled 2021-01-11: qty 20

## 2021-01-11 MED ORDER — CHLORHEXIDINE GLUCONATE 0.12 % MT SOLN
15.0000 mL | Freq: Once | OROMUCOSAL | Status: AC
  Filled 2021-01-11: qty 15

## 2021-01-11 MED ORDER — SODIUM CHLORIDE 0.9 % IR SOLN
Status: DC | PRN
  Administered 2021-01-11: 1000 mL

## 2021-01-11 MED ORDER — FENTANYL CITRATE (PF) 100 MCG/2ML IJ SOLN
25.0000 ug | INTRAMUSCULAR | Status: DC | PRN
  Administered 2021-01-11: 50 ug via INTRAVENOUS

## 2021-01-11 MED ORDER — HEMOSTATIC AGENTS (NO CHARGE) OPTIME
TOPICAL | Status: DC | PRN
  Administered 2021-01-11: 1 via TOPICAL

## 2021-01-11 SURGICAL SUPPLY — 45 items
APPLIER CLIP 5 13 M/L LIGAMAX5 (MISCELLANEOUS)
CANISTER SUCT 3000ML PPV (MISCELLANEOUS) ×2 IMPLANT
CHLORAPREP W/TINT 26 (MISCELLANEOUS) ×2 IMPLANT
CLIP APPLIE 5 13 M/L LIGAMAX5 (MISCELLANEOUS) IMPLANT
CLIP VESOLOCK MED LG 6/CT (CLIP) ×2 IMPLANT
COVER MAYO STAND STRL (DRAPES) ×2 IMPLANT
COVER SURGICAL LIGHT HANDLE (MISCELLANEOUS) ×2 IMPLANT
COVER TRANSDUCER ULTRASND (DRAPES) ×2 IMPLANT
COVER WAND RF STERILE (DRAPES) ×2 IMPLANT
DERMABOND ADVANCED (GAUZE/BANDAGES/DRESSINGS) ×1
DERMABOND ADVANCED .7 DNX12 (GAUZE/BANDAGES/DRESSINGS) ×1 IMPLANT
DRAPE C-ARM 42X120 X-RAY (DRAPES) ×2 IMPLANT
ELECT REM PT RETURN 9FT ADLT (ELECTROSURGICAL) ×2
ELECTRODE REM PT RTRN 9FT ADLT (ELECTROSURGICAL) ×1 IMPLANT
GLOVE BIO SURGEON STRL SZ7.5 (GLOVE) ×2 IMPLANT
GOWN STRL REUS W/ TWL LRG LVL3 (GOWN DISPOSABLE) ×2 IMPLANT
GOWN STRL REUS W/ TWL XL LVL3 (GOWN DISPOSABLE) ×1 IMPLANT
GOWN STRL REUS W/TWL LRG LVL3 (GOWN DISPOSABLE) ×4
GOWN STRL REUS W/TWL XL LVL3 (GOWN DISPOSABLE) ×2
GRASPER SUT TROCAR 14GX15 (MISCELLANEOUS) ×2 IMPLANT
HEMOSTAT SNOW SURGICEL 2X4 (HEMOSTASIS) ×2 IMPLANT
IV CATH 14GX2 1/4 (CATHETERS) ×2 IMPLANT
KIT BASIN OR (CUSTOM PROCEDURE TRAY) ×2 IMPLANT
KIT TURNOVER KIT B (KITS) ×2 IMPLANT
NEEDLE INSUFFLATION 14GA 120MM (NEEDLE) ×2 IMPLANT
NS IRRIG 1000ML POUR BTL (IV SOLUTION) ×2 IMPLANT
PAD ARMBOARD 7.5X6 YLW CONV (MISCELLANEOUS) ×4 IMPLANT
POUCH LAPAROSCOPIC INSTRUMENT (MISCELLANEOUS) ×2 IMPLANT
POUCH RETRIEVAL ECOSAC 10 (ENDOMECHANICALS) ×1 IMPLANT
POUCH RETRIEVAL ECOSAC 10MM (ENDOMECHANICALS) ×2
SCISSORS LAP 5X35 DISP (ENDOMECHANICALS) ×2 IMPLANT
SET CHOLANGIOGRAPHY FRANKLIN (SET/KITS/TRAYS/PACK) ×2 IMPLANT
SET IRRIG TUBING LAPAROSCOPIC (IRRIGATION / IRRIGATOR) ×2 IMPLANT
SET TUBE SMOKE EVAC HIGH FLOW (TUBING) ×2 IMPLANT
SLEEVE ENDOPATH XCEL 5M (ENDOMECHANICALS) ×4 IMPLANT
SPECIMEN JAR SMALL (MISCELLANEOUS) ×2 IMPLANT
STOPCOCK 4 WAY LG BORE MALE ST (IV SETS) ×2 IMPLANT
SUT MNCRL AB 4-0 PS2 18 (SUTURE) ×2 IMPLANT
TOWEL GREEN STERILE (TOWEL DISPOSABLE) ×2 IMPLANT
TOWEL GREEN STERILE FF (TOWEL DISPOSABLE) ×2 IMPLANT
TRAY LAPAROSCOPIC MC (CUSTOM PROCEDURE TRAY) ×2 IMPLANT
TROCAR XCEL NON-BLD 11X100MML (ENDOMECHANICALS) ×2 IMPLANT
TROCAR XCEL NON-BLD 5MMX100MML (ENDOMECHANICALS) ×2 IMPLANT
WARMER LAPAROSCOPE (MISCELLANEOUS) ×2 IMPLANT
WATER STERILE IRR 1000ML POUR (IV SOLUTION) ×2 IMPLANT

## 2021-01-11 NOTE — Progress Notes (Signed)
1 Day Post-Op   Subjective/Chief Complaint: Pt with no c/o  ERCP findings noted   Objective: Vital signs in last 24 hours: Temp:  [97.8 F (36.6 C)-98.7 F (37.1 C)] 98 F (36.7 C) (02/17 0544) Pulse Rate:  [75-101] 75 (02/17 0544) Resp:  [14-16] 16 (02/17 0544) BP: (122-145)/(84-90) 123/88 (02/17 0544) SpO2:  [95 %-100 %] 96 % (02/17 0544) Last BM Date: 01/09/21  Intake/Output from previous day: 02/16 0701 - 02/17 0700 In: 510.2 [I.V.:310.2; IV Piggyback:200] Out: -  Intake/Output this shift: No intake/output data recorded.  PE:  Constitutional: No acute distress, conversant, appears states age. Eyes: Anicteric sclerae, moist conjunctiva, no lid lag Lungs: Clear to auscultation bilaterally, normal respiratory effort CV: regular rate and rhythm, no murmurs, no peripheral edema, pedal pulses 2+ GI: Soft, no masses or hepatosplenomegaly, non-tender to palpation Skin: No rashes, palpation reveals normal turgor Psychiatric: appropriate judgment and insight, oriented to person, place, and time   Lab Results:  Recent Labs    01/10/21 0111 01/11/21 0308  WBC 6.9 6.5  HGB 13.6 12.9*  HCT 37.7* 36.2*  PLT 145* 164   BMET Recent Labs    01/10/21 0111 01/11/21 0308  NA 137 138  K 3.8 3.9  CL 104 105  CO2 25 24  GLUCOSE 107* 131*  BUN 10 9  CREATININE 0.91 0.97  CALCIUM 8.8* 8.7*   PT/INR Recent Labs    01/10/21 0111  LABPROT 14.6  INR 1.2   ABG No results for input(s): PHART, HCO3 in the last 72 hours.  Invalid input(s): PCO2, PO2  Studies/Results: CT ABDOMEN PELVIS W WO CONTRAST  Result Date: 01/10/2021 CLINICAL DATA:  Pancreatitis. EXAM: CT ABDOMEN AND PELVIS WITHOUT AND WITH CONTRAST TECHNIQUE: Multidetector CT imaging of the abdomen and pelvis was performed following the standard protocol before and following the bolus administration of intravenous contrast. CONTRAST:  OMNIPAQUE IOHEXOL 300 MG/ML  SOLN COMPARISON:  01/07/2021 FINDINGS: Lower  chest: Unremarkable Hepatobiliary: 16 mm hypodensity in the dome of the liver is stable but cannot be definitively characterized. 16 mm low-density lesion in segment III is likely a cyst. Layering calcified gallstones evident No intrahepatic or extrahepatic biliary dilation. Pancreas: No focal mass lesion. No dilatation of the main duct. No intraparenchymal cyst. There may be some trace peripancreatic edema anterior to the tail (35/10). No evidence for pancreas divisum. Spleen: No splenomegaly. No focal mass lesion. Adrenals/Urinary Tract: Thickening of both adrenal glands is similar to prior. Kidneys unremarkable. No evidence for hydroureter. Bladder is distended. Stomach/Bowel: Stomach is unremarkable. No gastric wall thickening. No evidence of outlet obstruction. Duodenum is normally positioned as is the ligament of Treitz. No small bowel wall thickening. No small bowel dilatation. The terminal ileum is normal. The appendix is normal. No gross colonic mass. No colonic wall thickening. Diverticular changes are noted in the left colon without evidence of diverticulitis. Vascular/Lymphatic: There is abdominal aortic atherosclerosis without aneurysm. There is no gastrohepatic or hepatoduodenal ligament lymphadenopathy. No retroperitoneal or mesenteric lymphadenopathy. No pelvic sidewall lymphadenopathy. Reproductive: The prostate gland and seminal vesicles are unremarkable. Other: No intraperitoneal free fluid. Musculoskeletal: No worrisome lytic or sclerotic osseous abnormality. Degenerative changes noted lower lumbar spine. IMPRESSION: 1. Interval development of trace peripancreatic edema anterior to the tail. Features compatible with acute pancreatitis. 2. Cholelithiasis. 3. Left colonic diverticulosis without diverticulitis. 4. Aortic Atherosclerosis (ICD10-I70.0). Electronically Signed   By: Kennith Center M.D.   On: 01/10/2021 18:44   DG ERCP BILIARY & PANCREATIC DUCTS  Result  Date: 01/10/2021 CLINICAL DATA:   70 year old male with a history of cholelithiasis/choledocholithiasis EXAM: ERCP TECHNIQUE: Multiple spot images obtained with the fluoroscopic device and submitted for interpretation post-procedure. FLUOROSCOPY TIME:  Fluoroscopy Time:  1 minutes 52 seconds COMPARISON:  None. FINDINGS: Limited intraoperative fluoroscopic spot images during ERCP. Initial image demonstrates endoscope projecting over the upper abdomen. There is subsequently placement of a safety wire and retrograde infusion of contrast, partially opacifying the extrahepatic biliary ducts. Vague filling defects within the distal common bile duct are present. There is then deployment of a retrieval balloon. IMPRESSION: Limited images during ERCP demonstrates deployment of a retrieval balloon for treatment of choledocholithiasis. Please refer to the dictated operative report for full details of intraoperative findings and procedure. Electronically Signed   By: Gilmer Mor D.O.   On: 01/10/2021 12:45    Anti-infectives: Anti-infectives (From admission, onward)   Start     Dose/Rate Route Frequency Ordered Stop   01/11/21 0600  cefTRIAXone (ROCEPHIN) 2 g in sodium chloride 0.9 % 100 mL IVPB        2 g 200 mL/hr over 30 Minutes Intravenous On call to O.R. 01/10/21 1321 01/12/21 0559   01/09/21 0845  ciprofloxacin (CIPRO) IVPB 400 mg        400 mg 200 mL/hr over 60 Minutes Intravenous On call 01/09/21 0838 01/10/21 0845      Assessment/Plan: HLD HTN A. Fib on eliquis GERD OA Gout   Biliary pancreatitis -s/p ERCP with stone removal -OR today for lap chole All risks and benefits were discussed with the patient to generally include: infection, bleeding, possible need for post op ERCP, damage to the bile ducts, and bile leak. Alternatives were offered and described.  All questions were answered and the patient voiced understanding of the procedure and wishes to proceed at this point with a laparoscopic cholecystectomy   FEN:  NPO VTE: heparin on hold ID: no current abx  LOS: 5 days    Axel Filler 01/11/2021

## 2021-01-11 NOTE — Discharge Instructions (Signed)
You can resume your Eliquis on Sunday.  Do not start it prior to this.  [facility] LAPAROSCOPIC SURGERY: POST OP INSTRUCTIONS Always review your discharge instruction sheet given to you by the facility where your surgery was performed. IF YOU HAVE DISABILITY OR FAMILY LEAVE FORMS, YOU MUST BRING THEM TO THE OFFICE FOR PROCESSING.   DO NOT GIVE THEM TO YOUR DOCTOR.  PAIN CONTROL  1. First take acetaminophen (Tylenol) AND/or ibuprofen (Advil) to control your pain after surgery.  Follow directions on package.  Taking acetaminophen (Tylenol) and/or ibuprofen (Advil) regularly after surgery will help to control your pain and lower the amount of prescription pain medication you may need.  You should not take more than 3,000 mg (3 grams) of acetaminophen (Tylenol) in 24 hours.  You should not take ibuprofen (Advil), aleve, motrin, naprosyn or other NSAIDS if you have a history of stomach ulcers or chronic kidney disease.  2. A prescription for pain medication may be given to you upon discharge.  Take your pain medication as prescribed, if you still have uncontrolled pain after taking acetaminophen (Tylenol) or ibuprofen (Advil). 3. Use ice packs to help control pain. 4. If you need a refill on your pain medication, please contact your pharmacy.  They will contact our office to request authorization. Prescriptions will not be filled after 5pm or on week-ends.  HOME MEDICATIONS 5. Take your usually prescribed medications unless otherwise directed.  DIET 6. You should follow a light diet the first few days after arrival home.  Be sure to include lots of fluids daily. Avoid fatty, fried foods.   CONSTIPATION 7. It is common to experience some constipation after surgery and if you are taking pain medication.  Increasing fluid intake and taking a stool softener (such as Colace) will usually help or prevent this problem from occurring.  A mild laxative (Milk of Magnesia or Miralax)  should be taken according to package instructions if there are no bowel movements after 48 hours.  WOUND/INCISION CARE 8. Most patients will experience some swelling and bruising in the area of the incisions.  Ice packs will help.  Swelling and bruising can take several days to resolve.  9. Unless discharge instructions indicate otherwise, follow guidelines below  a. STERI-STRIPS - you may remove your outer bandages 48 hours after surgery, and you may shower at that time.  You have steri-strips (small skin tapes) in place directly over the incision.  These strips should be left on the skin for 7-10 days.   b. DERMABOND/SKIN GLUE - you may shower in 24 hours.  The glue will flake off over the next 2-3 weeks. 10. Any sutures or staples will be removed at the office during your follow-up visit.  ACTIVITIES 11. You may resume regular (light) daily activities beginning the next day--such as daily self-care, walking, climbing stairs--gradually increasing activities as tolerated.  You may have sexual intercourse when it is comfortable.  Refrain from any heavy lifting or straining until approved by your doctor. a. You may drive when you are no longer taking prescription pain medication, you can comfortably wear a seatbelt, and you can safely maneuver your car and apply brakes.  FOLLOW-UP 12. You should see your doctor in the office for a follow-up appointment approximately 2-3 weeks after your surgery.  You should have been given your post-op/follow-up appointment when your surgery was scheduled.  If you did not receive a post-op/follow-up appointment, make sure that you call for this appointment within  a day or two after you arrive home to insure a convenient appointment time.   WHEN TO CALL YOUR DOCTOR: 1. Fever over 101.0 2. Inability to urinate 3. Continued bleeding from incision. 4. Increased pain, redness, or drainage from the incision. 5. Increasing abdominal pain  The clinic staff is  available to answer your questions during regular business hours.  Please don't hesitate to call and ask to speak to one of the nurses for clinical concerns.  If you have a medical emergency, go to the nearest emergency room or call 911.  A surgeon from [facility] is always on call at the hospital. [address], [place], [place]  [address] ? [address] [address], [place], [place]   [address] [phone] ? [phone] ? FAX [phone] Web site: [email or web address]  ........Marland Kitchen   Managing Your Pain After Surgery Without Opioids    Thank you for participating in our program to help patients manage their pain after surgery without opioids. This is part of our effort to provide you with the best care possible, without exposing you or your family to the risk that opioids pose.  What pain can I expect after surgery? You can expect to have some pain after surgery. This is normal. The pain is typically worse the day after surgery, and quickly begins to get better. Many studies have found that many patients are able to manage their pain after surgery with Over-the-Counter (OTC) medications such as Tylenol and Motrin. If you have a condition that does not allow you to take Tylenol or Motrin, notify your surgical team.  How will I manage my pain? The best strategy for controlling your pain after surgery is around the clock pain control with Tylenol (acetaminophen) and Motrin (ibuprofen or Advil). Alternating these medications with each other allows you to maximize your pain control. In addition to Tylenol and Motrin, you can use heating pads or ice packs on your incisions to help reduce your pain.  How will I alternate your regular strength over-the-counter pain medication? You will take a dose of pain medication every three hours. ; Start by taking 650 mg of Tylenol (2 pills of 325 mg) ; 3 hours later take 600 mg of Motrin (3 pills of 200 mg) ; 3 hours after taking  the Motrin take 650 mg of Tylenol ; 3 hours after that take 600 mg of Motrin.   - 1 -  See example - if your first dose of Tylenol is at 12:00 PM   12:00 PM Tylenol 650 mg (2 pills of 325 mg)  3:00 PM Motrin 600 mg (3 pills of 200 mg)  6:00 PM Tylenol 650 mg (2 pills of 325 mg)  9:00 PM Motrin 600 mg (3 pills of 200 mg)  Continue alternating every 3 hours   We recommend that you follow this schedule around-the-clock for at least 3 days after surgery, or until you feel that it is no longer needed. Use the table on the last page of this handout to keep track of the medications you are taking. Important: Do not take more than 3000mg  of Tylenol or 3200mg  of Motrin in a 24-hour period. Do not take ibuprofen/Motrin if you have a history of bleeding stomach ulcers, severe kidney disease, &/or actively taking a blood thinner  What if I still have pain? If you have pain that is not controlled with the over-the-counter pain medications (Tylenol and Motrin or Advil) you might have what we call "breakthrough" pain.  You will receive a prescription for a small amount of an opioid pain medication such as Oxycodone, Tramadol, or Tylenol with Codeine. Use these opioid pills in the first 24 hours after surgery if you have breakthrough pain. Do not take more than 1 pill every 4-6 hours.  If you still have uncontrolled pain after using all opioid pills, don't hesitate to call our staff using the number provided. We will help make sure you are managing your pain in the best way possible, and if necessary, we can provide a prescription for additional pain medication.   Day 1    Time  Name of Medication Number of pills taken  Amount of Acetaminophen  Pain Level   Comments  AM PM       AM PM       AM PM       AM PM       AM PM       AM PM       AM PM       AM PM       Total Daily amount of Acetaminophen Do not take more than  3,000 mg per day      Day 2    Time  Name of Medication Number of  pills taken  Amount of Acetaminophen  Pain Level   Comments  AM PM       AM PM       AM PM       AM PM       AM PM       AM PM       AM PM       AM PM       Total Daily amount of Acetaminophen Do not take more than  3,000 mg per day      Day 3    Time  Name of Medication Number of pills taken  Amount of Acetaminophen  Pain Level   Comments  AM PM       AM PM       AM PM       AM PM          AM PM       AM PM       AM PM       AM PM       Total Daily amount of Acetaminophen Do not take more than  3,000 mg per day      Day 4    Time  Name of Medication Number of pills taken  Amount of Acetaminophen  Pain Level   Comments  AM PM       AM PM       AM PM       AM PM       AM PM       AM PM       AM PM       AM PM       Total Daily amount of Acetaminophen Do not take more than  3,000 mg per day      Day 5    Time  Name of Medication Number of pills taken  Amount of Acetaminophen  Pain Level   Comments  AM PM       AM PM       AM PM       AM PM       AM PM  AM PM       AM PM       AM PM       Total Daily amount of Acetaminophen Do not take more than  3,000 mg per day       Day 6    Time  Name of Medication Number of pills taken  Amount of Acetaminophen  Pain Level  Comments  AM PM       AM PM       AM PM       AM PM       AM PM       AM PM       AM PM       AM PM       Total Daily amount of Acetaminophen Do not take more than  3,000 mg per day      Day 7    Time  Name of Medication Number of pills taken  Amount of Acetaminophen  Pain Level   Comments  AM PM       AM PM       AM PM       AM PM       AM PM       AM PM       AM PM       AM PM       Total Daily amount of Acetaminophen Do not take more than  3,000 mg per day        For additional information about how and where to safely dispose of unused opioid medications - RoleLink.com.br  Disclaimer: This document contains  information and/or instructional materials adapted from [place] for the typical patient with your condition. It does not replace medical advice from your health care provider because your experience may differ from that of the typical patient. Talk to your health care provider if you have any questions about this document, your condition or your treatment plan. Adapted from [employer]

## 2021-01-11 NOTE — Anesthesia Postprocedure Evaluation (Signed)
Anesthesia Post Note  Patient: Todd Barrett.  Procedure(s) Performed: LAPAROSCOPIC CHOLECYSTECTOMY (N/A Abdomen)     Patient location during evaluation: PACU Anesthesia Type: General Level of consciousness: awake and alert Pain management: pain level controlled Vital Signs Assessment: post-procedure vital signs reviewed and stable Respiratory status: spontaneous breathing, nonlabored ventilation, respiratory function stable and patient connected to nasal cannula oxygen Cardiovascular status: blood pressure returned to baseline and stable Postop Assessment: no apparent nausea or vomiting Anesthetic complications: no   No complications documented.  Last Vitals:  Vitals:   01/11/21 1340 01/11/21 1352  BP: (!) 145/84 (!) 136/91  Pulse: 81 77  Resp: 11 12  Temp:  (!) 36.3 C  SpO2: 100% 100%    Last Pain:  Vitals:   01/11/21 1336  TempSrc:   PainSc: 7                  Michall Noffke S

## 2021-01-11 NOTE — Anesthesia Procedure Notes (Signed)
Procedure Name: Intubation Date/Time: 01/11/2021 11:48 AM Performed by: Lelon Perla, CRNA Pre-anesthesia Checklist: Patient identified, Emergency Drugs available, Suction available and Patient being monitored Patient Re-evaluated:Patient Re-evaluated prior to induction Oxygen Delivery Method: Circle System Utilized Preoxygenation: Pre-oxygenation with 100% oxygen Induction Type: IV induction Ventilation: Mask ventilation without difficulty Laryngoscope Size: Miller and 3 Grade View: Grade II Tube type: Oral Tube size: 7.5 mm Number of attempts: 1 Airway Equipment and Method: Stylet and Oral airway Placement Confirmation: ETT inserted through vocal cords under direct vision,  positive ETCO2 and breath sounds checked- equal and bilateral Secured at: 22 cm Tube secured with: Tape Dental Injury: Teeth and Oropharynx as per pre-operative assessment

## 2021-01-11 NOTE — Anesthesia Preprocedure Evaluation (Addendum)
Anesthesia Evaluation  Patient identified by MRN, date of birth, ID band Patient awake    Reviewed: Allergy & Precautions, H&P , NPO status , Patient's Chart, lab work & pertinent test results  History of Anesthesia Complications Negative for: history of anesthetic complications  Airway Mallampati: II  TM Distance: >3 FB Neck ROM: Full    Dental no notable dental hx. (+) Dental Advisory Given   Pulmonary neg pulmonary ROS,    Pulmonary exam normal        Cardiovascular hypertension, Pt. on medications Normal cardiovascular exam+ dysrhythmias Atrial Fibrillation   IMPRESSIONS    1. Left ventricular ejection fraction, by estimation, is 50 to 55%. The  left ventricle has low normal function. The left ventricle has no regional  wall motion abnormalities. Left ventricular diastolic parameters were  normal.  2. Right ventricular systolic function is normal. The right ventricular  size is normal.  3. The mitral valve is grossly normal. No evidence of mitral valve  regurgitation.  4. The aortic valve is tricuspid. There is mild calcification of the  aortic valve. There is mild thickening of the aortic valve. Aortic valve  regurgitation is not visualized. No aortic stenosis is present.  5. Aortic dilatation noted. 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 There is borderline dilatation of the  ascending aorta, measuring 37 mm. There is mild dilatation of the  transverse aorta, measuring 37 mm.   Comparison(s): A prior study was performed on 01/29/2018. Prior images  reviewed side by side. Slight decrease in LV function, aortic dilation  noted.    Neuro/Psych Anxiety negative neurological ROS     GI/Hepatic Neg liver ROS, GERD  ,  Endo/Other  negative endocrine ROS  Renal/GU negative Renal ROS  negative genitourinary   Musculoskeletal  (+) Arthritis ,   Abdominal   Peds   Hematology negative hematology ROS (+)   Anesthesia Other Findings   Reproductive/Obstetrics negative OB ROS                            Anesthesia Physical  Anesthesia Plan  ASA: III  Anesthesia Plan: General   Post-op Pain Management:    Induction: Intravenous  PONV Risk Score and Plan: 3 and Ondansetron, Dexamethasone and Midazolam  Airway Management Planned: Oral ETT  Additional Equipment:   Intra-op Plan:   Post-operative Plan: Extubation in OR  Informed Consent: I have reviewed the patients History and Physical, chart, labs and discussed the procedure including the risks, benefits and alternatives for the proposed anesthesia with the patient or authorized representative who has indicated his/her understanding and acceptance.     Dental advisory given  Plan Discussed with: CRNA and Anesthesiologist  Anesthesia Plan Comments:        Anesthesia Quick Evaluation

## 2021-01-11 NOTE — Transfer of Care (Signed)
Immediate Anesthesia Transfer of Care Note  Patient: Todd Barrett.  Procedure(s) Performed: LAPAROSCOPIC CHOLECYSTECTOMY (N/A Abdomen)  Patient Location: PACU  Anesthesia Type:General  Level of Consciousness: awake, alert  and oriented  Airway & Oxygen Therapy: Patient Spontanous Breathing and Patient connected to face mask oxygen  Post-op Assessment: Report given to RN and Post -op Vital signs reviewed and stable  Post vital signs: Reviewed and stable  Last Vitals:  Vitals Value Taken Time  BP 132/90 01/11/21 1310  Temp 36.1 C 01/11/21 1310  Pulse 97 01/11/21 1311  Resp 17 01/11/21 1311  SpO2 96 % 01/11/21 1311  Vitals shown include unvalidated device data.  Last Pain:  Vitals:   01/11/21 1310  TempSrc:   PainSc: 0-No pain         Complications: No complications documented.

## 2021-01-11 NOTE — Progress Notes (Signed)
PROGRESS NOTE  Todd Barrett. BTD:[id number] DOB: Apr 17, 1951 DOA: 01/06/2021 PCP: Corwin Levins, MD   LOS: 5 days   Brief Narrative / Interim history: 70 year old male with history of A. fib on Eliquis, initially admitted to the cardiology service for chest pain resolved after sublingual nitroglycerin, then eventually developed epigastric abdominal pain shortly after admission.  He was found to have elevated lipase close to 1,000, was diagnosed with acute gallstone pancreatitis.  He was transferred to the hospitalist service 2/16, GI plans to do an ERCP and general surgery cholecystectomy after that   Subjective / 24h Interval events: Feeling great, planned for cholecystectomy later today  Assessment & Plan: Principal Problem Acute gallstone pancreatitis -Right upper quadrant ultrasound showed moderate sludge in the gallbladder with small stones.  There is increased wall thickening but negative Murphy sign.  CBD was slightly enlarged, and given elevated LFTs patient underwent ERCP 2/16 s/p sphncterotomy and stone removal -Cholecystectomy today  Active Problems Paroxysmal A. Fib -Had an episode several years ago and again this admission.  He is on Eliquis which is now on hold pending procedures as above. -Coronary calcium score 36, no evidence of CAD.  On aspirin and atorvastatin.  Thoracic aortic aneurysm -4.4 cm on CT, follow as an outpatient  Hypertension -Continue lisinopril  Scheduled Meds: . acetaminophen  1,000 mg Oral Q6H  . aspirin EC  81 mg Oral Daily  . atorvastatin  10 mg Oral Daily  . lisinopril  10 mg Oral Daily  . pantoprazole  40 mg Oral Q0600   Continuous Infusions: . sodium chloride 50 mL/hr at 01/10/21 1742  . ondansetron (ZOFRAN) IV     PRN Meds:.morphine injection, nitroGLYCERIN, ondansetron (ZOFRAN) IV, oxyCODONE, simethicone  Diet Orders (From admission, onward)    Start     Ordered   01/11/21 1349  Diet clear liquid Room service appropriate?  Yes; Fluid consistency: Thin  Diet effective now       Question Answer Comment  Room service appropriate? Yes   Fluid consistency: Thin      01/11/21 1348          DVT prophylaxis:      Code Status: Full Code  Family Communication: Wife at bedside  Status is: Inpatient  Remains inpatient appropriate because:Inpatient level of care appropriate due to severity of illness   Dispo: The patient is from: Home              Anticipated d/c is to: Home              Anticipated d/c date is: 1 day              Patient currently is not medically stable to d/c.   Difficult to place patient No   Level of care: Telemetry Cardiac  Consultants:  Cardiology Gastroenterology General surgery  Procedures:  2D echo  Microbiology  None   Antimicrobials: None     Objective: Vitals:   01/11/21 1336 01/11/21 1340 01/11/21 1352 01/11/21 1411  BP:  (!) 145/84 (!) 136/91 (!) 138/93  Pulse: 79 81 77 69  Resp: 11 11 12 20   Temp:   (!) 97.3 F (36.3 C) 97.7 F (36.5 C)  TempSrc:    Oral  SpO2: 100% 100% 100% 100%  Weight:      Height:        Intake/Output Summary (Last 24 hours) at 01/11/2021 1722 Last data filed at 01/11/2021 1310 Gross per 24 hour  Intake 1210.16 ml  Output 25 ml  Net 1185.16 ml   Filed Weights   01/06/21 1246 01/07/21 0433  Weight: 102.1 kg 100.6 kg    Examination:  Constitutional: no distress Eyes: no icterus  ENMT: mmm Neck: normal, supple Respiratory: CTA biL, no wheezing Cardiovascular: RRR, no mrg, no edema Abdomen: soft, nt, nd, BS+ Musculoskeletal: no clubbing / cyanosis.  Skin: no rashes Neurologic: no focal deficits  Data Reviewed: I have independently reviewed following labs and imaging studies   CBC: Recent Labs  Lab 01/06/21 1302 01/07/21 0457 01/09/21 0143 01/10/21 0111 01/11/21 0308  WBC 8.9 4.1 6.0 6.9 6.5  HGB 14.1 14.2 12.6* 13.6 12.9*  HCT 42.3 40.0 37.8* 37.7* 36.2*  MCV 90.2 88.7 92.4 87.7 87.7  PLT 164 143*  134* 145* 164   Basic Metabolic Panel: Recent Labs  Lab 01/06/21 1302 01/07/21 0457 01/09/21 0143 01/10/21 0111 01/11/21 0308  NA 139 139 138 137 138  K 3.9 4.1 3.8 3.8 3.9  CL 104 105 103 104 105  CO2 22 25 25 25 24   GLUCOSE 164* 125* 98 107* 131*  BUN 23 16 11 10 9   CREATININE 1.10 1.02 1.02 0.91 0.97  CALCIUM 9.1 8.9 8.8* 8.8* 8.7*  MG 2.2  --   --   --   --    Liver Function Tests: Recent Labs  Lab 01/08/21 1300 01/09/21 0143 01/10/21 0111 01/11/21 0308  AST 322* 248* 107* 50*  ALT 492* 440* 290* 186*  ALKPHOS 155* 176* 189* 144*  BILITOT 5.6* 6.0* 2.8* 1.8*  PROT 6.0* 5.7* 5.8* 5.5*  ALBUMIN 3.2* 2.9* 2.8* 2.5*   Coagulation Profile: Recent Labs  Lab 01/10/21 0111  INR 1.2   HbA1C: No results for input(s): HGBA1C in the last 72 hours. CBG: Recent Labs  Lab 01/11/21 1032  GLUCAP 122*    Recent Results (from the past 240 hour(s))  Resp Panel by RT-PCR (Flu A&B, Covid) Nasopharyngeal Swab     Status: None   Collection Time: 01/06/21  1:02 PM   Specimen: Nasopharyngeal Swab; Nasopharyngeal(NP) swabs in vial transport medium  Result Value Ref Range Status   SARS Coronavirus 2 by RT PCR NEGATIVE NEGATIVE Final    Comment: (NOTE) SARS-CoV-2 target nucleic acids are NOT DETECTED.  The SARS-CoV-2 RNA is generally detectable in upper respiratory specimens during the acute phase of infection. The lowest concentration of SARS-CoV-2 viral copies this assay can detect is 138 copies/mL. A negative result does not preclude SARS-Cov-2 infection and should not be used as the sole basis for treatment or other patient management decisions. A negative result may occur with  improper specimen collection/handling, submission of specimen other than nasopharyngeal swab, presence of viral mutation(s) within the areas targeted by this assay, and inadequate number of viral copies(<138 copies/mL). A negative result must be combined with clinical observations, patient  history, and epidemiological information. The expected result is Negative.  Fact Sheet for Patients:  01/13/21  Fact Sheet for Healthcare Providers:  03/06/21  This test is no t yet approved or cleared by the [employer] FDA and  has been authorized for detection and/or diagnosis of SARS-CoV-2 by FDA under an Emergency Use Authorization (EUA). This EUA will remain  in effect (meaning this test can be used) for the duration of the COVID-19 declaration under Section 564(b)(1) of the Act, 21 U.S.C.section 360bbb-3(b)(1), unless the authorization is terminated  or revoked sooner.       Influenza A by PCR NEGATIVE NEGATIVE Final   Influenza  B by PCR NEGATIVE NEGATIVE Final    Comment: (NOTE) The Xpert Xpress SARS-CoV-2/FLU/RSV plus assay is intended as an aid in the diagnosis of influenza from Nasopharyngeal swab specimens and should not be used as a sole basis for treatment. Nasal washings and aspirates are unacceptable for Xpert Xpress SARS-CoV-2/FLU/RSV testing.  Fact Sheet for Patients: [employer]com  Fact Sheet for Healthcare Providers: SeriousBroker.it  This test is not yet approved or cleared by the [place] FDA and has been authorized for detection and/or diagnosis of SARS-CoV-2 by FDA under an Emergency Use Authorization (EUA). This EUA will remain in effect (meaning this test can be used) for the duration of the COVID-19 declaration under Section 564(b)(1) of the Act, 21 U.S.C. section 360bbb-3(b)(1), unless the authorization is terminated or revoked.  Performed at [facility], [address] [place], [place] [address]   Surgical pcr screen     Status: None   Collection Time: 01/10/21  9:15 PM   Specimen: Nasal Mucosa; Nasal Swab  Result Value Ref Range Status   MRSA, PCR NEGATIVE NEGATIVE Final   Staphylococcus aureus NEGATIVE  NEGATIVE Final    Comment: (NOTE) The Xpert SA Assay (FDA approved for NASAL specimens in patients 64 years of age and older), is one component of a comprehensive surveillance program. It is not intended to diagnose infection nor to guide or monitor treatment. Performed at [facility], [address], [place], [place] [address]      Radiology Studies: No results found.  Pamella Pert, MD, PhD [facility]  Between 7 am - 7 pm I am available, please contact me via Amion or Securechat  Between 7 pm - 7 am I am not available, please contact night coverage MD/APP via Amion

## 2021-01-11 NOTE — Op Note (Signed)
01/11/2021  12:48 PM  PATIENT:  Todd Barrett.  70 y.o. male  PRE-OPERATIVE DIAGNOSIS:  biliary pancreatitis  POST-OPERATIVE DIAGNOSIS:  biliary pancreatitis  PROCEDURE:  Procedure(s): LAPAROSCOPIC CHOLECYSTECTOMY (N/A)  SURGEON:  Surgeon(s) and Role:    * Axel Filler, MD - Primary  ANESTHESIA:   local and general  EBL:  minimal   BLOOD ADMINISTERED:none  DRAINS: none   LOCAL MEDICATIONS USED:  BUPIVICAINE   SPECIMEN:  Source of Specimen:  gallbladder  DISPOSITION OF SPECIMEN:  PATHOLOGY  COUNTS:  YES  TOURNIQUET:  * No tourniquets in log *  DICTATION: .Dragon Dictation The patient was taken to the operating and placed in the supine position with bilateral SCDs in place.  The patient was prepped and draped in the usual sterile fashion. A time out was called and all facts were verified. A pneumoperitoneum was obtained via A Veress needle technique to a pressure of 40mm of mercury.  A 20mm trochar was then placed in the right upper quadrant under visualization, and there were no injuries to any abdominal organs. A 11 mm port was then placed in the umbilical region after infiltrating with local anesthesia under direct visualization. A second and third epigastric port and right lower quadrant port placement under direct visualization, respectively.    The gallbladder was identified and retracted, the peritoneum was then sharply dissected from the gallbladder and this dissection was carried down to Calot's triangle. The cystic duct was identified and stripped away circumferentially and seen going into the gallbladder 360, the critical angle was obtained.  2 clips were placed proximally one distally and the cystic duct transected. The cystic artery was identified and 2 clips placed proximally and one distally and transected.  We then proceeded to remove the gallbladder off the hepatic fossa with Bovie cautery. A retrieval bag was then placed in the abdomen and gallbladder  placed in the bag. The hepatic fossa was then reexamined and hemostasis was achieved with Bovie cautery and was excellent at the end of the case. I did place a piece of hemostatic snow to help with hemostasis. There was some spillage of the bile into the abdomen.  This was irrigated out.  The subhepatic fossa and perihepatic fossa was then irrigated until the effluent was clear.  The gallbladder and bag were removed from the abdominal cavity. The 11 mm trocar fascia was reapproximated with the Endo Close #1 Vicryl x3.  The pneumoperitoneum was evacuated and all trochars removed under direct visulalization.  The skin was then closed with 4-0 Monocryl and the skin dressed with Dermabond.    The patient was awaken from general anesthesia and taken to the recovery room in stable condition.   PLAN OF CARE: Admit to inpatient   PATIENT DISPOSITION:  PACU - hemodynamically stable.   Delay start of Pharmacological VTE agent (>24hrs) due to surgical blood loss or risk of bleeding: not applicable

## 2021-01-12 ENCOUNTER — Encounter (HOSPITAL_COMMUNITY): Payer: Self-pay | Admitting: General Surgery

## 2021-01-12 DIAGNOSIS — K8051 Calculus of bile duct without cholangitis or cholecystitis with obstruction: Secondary | ICD-10-CM

## 2021-01-12 LAB — CBC
HCT: 38.1 % — ABNORMAL LOW (ref 39.0–52.0)
Hemoglobin: 13.1 g/dL (ref 13.0–17.0)
MCH: 30.9 pg (ref 26.0–34.0)
MCHC: 34.4 g/dL (ref 30.0–36.0)
MCV: 89.9 fL (ref 80.0–100.0)
Platelets: 198 10*3/uL (ref 150–400)
RBC: 4.24 MIL/uL (ref 4.22–5.81)
RDW: 12.3 % (ref 11.5–15.5)
WBC: 6.9 10*3/uL (ref 4.0–10.5)
nRBC: 0 % (ref 0.0–0.2)

## 2021-01-12 LAB — SURGICAL PATHOLOGY

## 2021-01-12 NOTE — Progress Notes (Signed)
RN gave pt discharge instructions and the patient stated understanding. IV has been removed, and belongings packed pt is waiting for his wife downstairs.

## 2021-01-12 NOTE — Progress Notes (Signed)
1 Day Post-Op  Subjective: Doing significantly better this morning than last night.  Pain well controlled on oxy.  Eager to go home today.  Tolerating his diet.  ROS: See above, otherwise other systems negative  Objective: Vital signs in last 24 hours: Temp:  [97 F (36.1 C)-98.4 F (36.9 C)] 98.4 F (36.9 C) (02/18 0508) Pulse Rate:  [69-96] 70 (02/18 0508) Resp:  [11-22] 18 (02/18 0508) BP: (117-145)/(80-93) 122/82 (02/18 0508) SpO2:  [93 %-100 %] 97 % (02/18 0508) Last BM Date: 01/09/21  Intake/Output from previous day: 02/17 0701 - 02/18 0700 In: 1340 [P.O.:240; I.V.:1000; IV Piggyback:100] Out: 25 [Blood:25] Intake/Output this shift: No intake/output data recorded.  PE: Abd: soft, minimally tender currently, +BS, ND, incisions c/d/i  Lab Results:  Recent Labs    01/11/21 0308 01/12/21 0014  WBC 6.5 6.9  HGB 12.9* 13.1  HCT 36.2* 38.1*  PLT 164 198   BMET Recent Labs    01/10/21 0111 01/11/21 0308  NA 137 138  K 3.8 3.9  CL 104 105  CO2 25 24  GLUCOSE 107* 131*  BUN 10 9  CREATININE 0.91 0.97  CALCIUM 8.8* 8.7*   PT/INR Recent Labs    01/10/21 0111  LABPROT 14.6  INR 1.2   CMP     Component Value Date/Time   NA 138 01/11/2021 0308   NA 139 03/13/2020 1307   K 3.9 01/11/2021 0308   CL 105 01/11/2021 0308   CO2 24 01/11/2021 0308   GLUCOSE 131 (H) 01/11/2021 0308   GLUCOSE 104 (H) 10/23/2006 0848   BUN 9 01/11/2021 0308   BUN 27 03/13/2020 1307   CREATININE 0.97 01/11/2021 0308   CALCIUM 8.7 (L) 01/11/2021 0308   PROT 5.5 (L) 01/11/2021 0308   ALBUMIN 2.5 (L) 01/11/2021 0308   AST 50 (H) 01/11/2021 0308   ALT 186 (H) 01/11/2021 0308   ALKPHOS 144 (H) 01/11/2021 0308   BILITOT 1.8 (H) 01/11/2021 0308   GFRNONAA >60 01/11/2021 0308   GFRAA 87 03/13/2020 1307   Lipase     Component Value Date/Time   LIPASE 56 (H) 01/11/2021 0308       Studies/Results: CT ABDOMEN PELVIS W WO CONTRAST  Result Date: 01/10/2021 CLINICAL  DATA:  Pancreatitis. EXAM: CT ABDOMEN AND PELVIS WITHOUT AND WITH CONTRAST TECHNIQUE: Multidetector CT imaging of the abdomen and pelvis was performed following the standard protocol before and following the bolus administration of intravenous contrast. CONTRAST:  OMNIPAQUE IOHEXOL 300 MG/ML  SOLN COMPARISON:  01/07/2021 FINDINGS: Lower chest: Unremarkable Hepatobiliary: 16 mm hypodensity in the dome of the liver is stable but cannot be definitively characterized. 16 mm low-density lesion in segment III is likely a cyst. Layering calcified gallstones evident No intrahepatic or extrahepatic biliary dilation. Pancreas: No focal mass lesion. No dilatation of the main duct. No intraparenchymal cyst. There may be some trace peripancreatic edema anterior to the tail (35/10). No evidence for pancreas divisum. Spleen: No splenomegaly. No focal mass lesion. Adrenals/Urinary Tract: Thickening of both adrenal glands is similar to prior. Kidneys unremarkable. No evidence for hydroureter. Bladder is distended. Stomach/Bowel: Stomach is unremarkable. No gastric wall thickening. No evidence of outlet obstruction. Duodenum is normally positioned as is the ligament of Treitz. No small bowel wall thickening. No small bowel dilatation. The terminal ileum is normal. The appendix is normal. No gross colonic mass. No colonic wall thickening. Diverticular changes are noted in the left colon without evidence of diverticulitis. Vascular/Lymphatic: There is  abdominal aortic atherosclerosis without aneurysm. There is no gastrohepatic or hepatoduodenal ligament lymphadenopathy. No retroperitoneal or mesenteric lymphadenopathy. No pelvic sidewall lymphadenopathy. Reproductive: The prostate gland and seminal vesicles are unremarkable. Other: No intraperitoneal free fluid. Musculoskeletal: No worrisome lytic or sclerotic osseous abnormality. Degenerative changes noted lower lumbar spine. IMPRESSION: 1. Interval development of trace  peripancreatic edema anterior to the tail. Features compatible with acute pancreatitis. 2. Cholelithiasis. 3. Left colonic diverticulosis without diverticulitis. 4. Aortic Atherosclerosis (ICD10-I70.0). Electronically Signed   By: Kennith Center M.D.   On: 01/10/2021 18:44   DG ERCP BILIARY & PANCREATIC DUCTS  Result Date: 01/10/2021 CLINICAL DATA:  70 year old male with a history of cholelithiasis/choledocholithiasis EXAM: ERCP TECHNIQUE: Multiple spot images obtained with the fluoroscopic device and submitted for interpretation post-procedure. FLUOROSCOPY TIME:  Fluoroscopy Time:  1 minutes 52 seconds COMPARISON:  None. FINDINGS: Limited intraoperative fluoroscopic spot images during ERCP. Initial image demonstrates endoscope projecting over the upper abdomen. There is subsequently placement of a safety wire and retrograde infusion of contrast, partially opacifying the extrahepatic biliary ducts. Vague filling defects within the distal common bile duct are present. There is then deployment of a retrieval balloon. IMPRESSION: Limited images during ERCP demonstrates deployment of a retrieval balloon for treatment of choledocholithiasis. Please refer to the dictated operative report for full details of intraoperative findings and procedure. Electronically Signed   By: Gilmer Mor D.O.   On: 01/10/2021 12:45    Anti-infectives: Anti-infectives (From admission, onward)   Start     Dose/Rate Route Frequency Ordered Stop   01/11/21 0600  cefTRIAXone (ROCEPHIN) 2 g in sodium chloride 0.9 % 100 mL IVPB        2 g 200 mL/hr over 30 Minutes Intravenous On call to O.R. 01/10/21 1321 01/11/21 1928   01/09/21 0845  ciprofloxacin (CIPRO) IVPB 400 mg        400 mg 200 mL/hr over 60 Minutes Intravenous On call 01/09/21 0838 01/10/21 0845       Assessment/Plan HLD HTN A. Fib on eliquis GERD OA Gout   POD 1, s/p lap chole for Biliary pancreatitis -pain better controlled today -tolerating  diet -surgically stable for DC home today -restart eliquis on Sunday -follow up arranged in our office in 3 weeks.  Wife has picked up his Rx already    LOS: 6 days    Letha Cape , [facility] 01/12/2021, 10:15 AM Please see Amion for pager number during day hours 7:00am-4:30pm or 7:00am -11:30am on weekends

## 2021-01-12 NOTE — Discharge Summary (Signed)
Physician Discharge Summary  Todd Barrett. TMH:[id number] DOB: [birth year] DOA: 01/06/2021  PCP: Corwin Levins, MD  Admit date: 01/06/2021 Discharge date: 01/12/2021  Admitted From: home Disposition:  home  Recommendations for Outpatient Follow-up:  1. Follow up with PCP in 1-2 weeks 2. Follow up with General surgery as scheduled  Home Health: none Equipment/Devices: none  Discharge Condition: stable CODE STATUS: Full code Diet recommendation: regular  HPI:  70 year old male with history of A. fib on Eliquis, initially admitted to the cardiology service for chest pain resolved after sublingual nitroglycerin, then eventually developed epigastric abdominal pain shortly after admission.  He was found to have elevated lipase close to 1,000, was diagnosed with acute gallstone pancreatitis.   Hospital Course / Discharge diagnoses: Principal Problem Acute gallstone pancreatitis -patient admitted to the hospital with epigastric pain, found to have acute pancreatitis. Right upper quadrant ultrasound showed moderate sludge in the gallbladder with small stones.  There is increased wall thickening but negative Murphy sign.  CBD was slightly enlarged, and given elevated LFTs patient underwent ERCP 2/16 s/p sphncterotomy and stone removal. This was followed by lap cholecystectomy on 2/17. Patient recovered well post op, tolerating a diet, and will be discharged home in stable condition with outpatient follow up  Active Problems Paroxysmal A. Fib -Had an episode several years ago and again this admission.  He is on Eliquis which he is to resume this Sunday Coronary calcium score 36, no evidence of CAD.  On aspirin and atorvastatin. Hold aspirin for now given that he is on Eliquis. Outpatient follow up Thoracic aortic aneurysm -4.4 cm on CT, follow as an outpatient Hypertension -Continue lisinopril  Sepsis ruled out   Discharge Instructions   Allergies as of 01/12/2021      Reactions    Dust Mite Extract Other (See Comments)   Nasal Congestion   Drug Ingredient [bee Pollen] Other (See Comments)   Congestion   Other Other (See Comments)   Pet Dander- Runny nose      Medication List    TAKE these medications   acetaminophen 500 MG tablet Commonly known as: TYLENOL Take 2 tablets (1,000 mg total) by mouth every 6 (six) hours as needed.   amoxicillin 500 MG capsule Commonly known as: AMOXIL Take 500 mg by mouth once as needed (pre-dental appointment/surgery).   apixaban 5 MG Tabs tablet Commonly known as: ELIQUIS Take 1 tablet (5 mg total) by mouth 2 (two) times daily.   atorvastatin 10 MG tablet Commonly known as: LIPITOR Take 1 tablet (10 mg total) by mouth daily.   fexofenadine 180 MG tablet Commonly known as: ALLEGRA Take 180 mg by mouth in the morning and at bedtime.   FIBER-CAPS PO Take 2 tablets by mouth daily.   Fish Oil 1200 MG Cpdr Take 3,600 mg by mouth daily.   fluticasone 50 MCG/ACT nasal spray Commonly known as: FLONASE Place 2 sprays into the nose daily as needed for allergies. For allergies.   lisinopril 10 MG tablet Commonly known as: ZESTRIL Take 1 tablet (10 mg total) by mouth daily.   naproxen 500 MG tablet Commonly known as: Naprosyn Take 1 tablet (500 mg total) by mouth 2 (two) times daily with a meal.   oxyCODONE 5 MG immediate release tablet Commonly known as: Oxy IR/ROXICODONE Take 1 tablet (5 mg total) by mouth every 4 (four) hours as needed for moderate pain.   penicillin v potassium 500 MG tablet Commonly known as: VEETID Take 500 mg by  mouth once as needed (pre-dental appointment/surgery).   VITAMIN D PO Take 2 tablets by mouth daily.       Follow-up Information    [facility] Follow up on 02/01/2021.   Specialty: General Surgery Why: 1:45pm, arrive by 1:15pm for paperwork and check in process.  please bring photo ID and insurance card. Contact information: [address] [place] [address] [phone]              Consultations:  General surgery   Gastroenterology   Procedures/Studies:  Lap chole  CT ABDOMEN PELVIS W WO CONTRAST  Result Date: 01/10/2021 CLINICAL DATA:  Pancreatitis. EXAM: CT ABDOMEN AND PELVIS WITHOUT AND WITH CONTRAST TECHNIQUE: Multidetector CT imaging of the abdomen and pelvis was performed following the standard protocol before and following the bolus administration of intravenous contrast. CONTRAST:  OMNIPAQUE IOHEXOL 300 MG/ML  SOLN COMPARISON:  01/07/2021 FINDINGS: Lower chest: Unremarkable Hepatobiliary: 16 mm hypodensity in the dome of the liver is stable but cannot be definitively characterized. 16 mm low-density lesion in segment III is likely a cyst. Layering calcified gallstones evident No intrahepatic or extrahepatic biliary dilation. Pancreas: No focal mass lesion. No dilatation of the main duct. No intraparenchymal cyst. There may be some trace peripancreatic edema anterior to the tail (35/10). No evidence for pancreas divisum. Spleen: No splenomegaly. No focal mass lesion. Adrenals/Urinary Tract: Thickening of both adrenal glands is similar to prior. Kidneys unremarkable. No evidence for hydroureter. Bladder is distended. Stomach/Bowel: Stomach is unremarkable. No gastric wall thickening. No evidence of outlet obstruction. Duodenum is normally positioned as is the ligament of Treitz. No small bowel wall thickening. No small bowel dilatation. The terminal ileum is normal. The appendix is normal. No gross colonic mass. No colonic wall thickening. Diverticular changes are noted in the left colon without evidence of diverticulitis. Vascular/Lymphatic: There is abdominal aortic atherosclerosis without aneurysm. There is no gastrohepatic or hepatoduodenal ligament lymphadenopathy. No retroperitoneal or mesenteric lymphadenopathy. No pelvic sidewall lymphadenopathy. Reproductive: The prostate gland and seminal  vesicles are unremarkable. Other: No intraperitoneal free fluid. Musculoskeletal: No worrisome lytic or sclerotic osseous abnormality. Degenerative changes noted lower lumbar spine. IMPRESSION: 1. Interval development of trace peripancreatic edema anterior to the tail. Features compatible with acute pancreatitis. 2. Cholelithiasis. 3. Left colonic diverticulosis without diverticulitis. 4. Aortic Atherosclerosis (ICD10-I70.0). Electronically Signed   By: Kennith Center M.D.   On: 01/10/2021 18:44   CT ABDOMEN PELVIS WO CONTRAST  Result Date: 01/07/2021 CLINICAL DATA:  Abdominal pain, cardiac catheterization today EXAM: CT ABDOMEN AND PELVIS WITHOUT CONTRAST TECHNIQUE: Multidetector CT imaging of the abdomen and pelvis was performed following the standard protocol without IV contrast. Oral enteric contrast was administered. COMPARISON:  None. FINDINGS: Lower chest: No acute abnormality. Hepatobiliary: Fluid attenuation lesions of the left and right lobes of the liver, incompletely characterized on this noncontrast examination although likely cysts or hemangiomata. Small gallstones and sludge in the gallbladder. Gallbladder wall thickening, or biliary dilatation. Pancreas: Unremarkable. No pancreatic ductal dilatation or surrounding inflammatory changes. Spleen: Normal in size without significant abnormality. Adrenals/Urinary Tract: Adrenal glands are unremarkable. Kidneys are normal, without renal calculi, solid lesion, or hydronephrosis. Excreted contrast in the urinary bladder. Stomach/Bowel: Stomach is within normal limits. Appendix appears normal. No evidence of bowel wall thickening, distention, or inflammatory changes. Sigmoid diverticulosis. Vascular/Lymphatic: Aortic atherosclerosis. No enlarged abdominal or pelvic lymph nodes. Reproductive: No mass or other significant abnormality. Other: Status post bilateral inguinal hernia repair. No abdominopelvic ascites.  Musculoskeletal: No acute or significant  osseous findings. IMPRESSION: 1. No acute noncontrast CT findings of the abdomen or pelvis to explain abdominal pain. 2. Small gallstones and sludge in the gallbladder without evidence of acute cholecystitis. 3. Sigmoid diverticulosis without evidence of acute diverticulitis. Aortic Atherosclerosis (ICD10-I70.0). Electronically Signed   By: Lauralyn Primes M.D.   On: 01/07/2021 18:46   DG Chest 2 View  Result Date: 12/21/2020 CLINICAL DATA:  Epigastric pain EXAM: CHEST - 2 VIEW COMPARISON:  05/31/2010 FINDINGS: Cardiac shadow is within normal limits. Aortic calcifications are seen. The lungs are well aerated bilaterally. No infiltrate or effusion is seen. Degenerative changes of the thoracic spine noted. Bilateral shoulder replacements are seen. IMPRESSION: No acute abnormality noted. Electronically Signed   By: Alcide Clever M.D.   On: 12/21/2020 11:09   CT Angio Chest PE W and/or Wo Contrast  Result Date: 01/06/2021 CLINICAL DATA:  Chest pain and shortness of breath today. EXAM: CT ANGIOGRAPHY CHEST WITH CONTRAST TECHNIQUE: Multidetector CT imaging of the chest was performed using the standard protocol during bolus administration of intravenous contrast. Multiplanar CT image reconstructions and MIPs were obtained to evaluate the vascular anatomy. CONTRAST:  65mL OMNIPAQUE IOHEXOL 350 MG/ML SOLN COMPARISON:  None. FINDINGS: Cardiovascular: The heart is within normal limits in size. No pericardial effusion. There is fusiform aneurysmal dilatation of the ascending thoracic aorta with maximum measurement of 4.4 cm. Scattered calcifications at the aortic arch. Scattered coronary artery calcifications. The pulmonary arterial tree is well opacified. No filling defects to suggest pulmonary embolism. Mediastinum/Nodes: No mediastinal or hilar mass or adenopathy. The esophagus is unremarkable. Lungs/Pleura: No acute pulmonary findings. No infiltrates or effusions. No pulmonary edema. No worrisome pulmonary lesions. Mild  dependent atelectasis is noted. Upper Abdomen: No significant upper abdominal findings. Small scattered hepatic cysts are noted. Musculoskeletal: No chest wall mass, supraclavicular or axillary adenopathy. The bony thorax is intact.  No worrisome bone lesions. Review of the MIP images confirms the above findings. IMPRESSION: 1. No CT findings for pulmonary embolism. 2. 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 Recommend semi-annual imaging followup by CTA or MRA and referral to cardiothoracic surgery if not already obtained. 2010; 121: [id number]. 3. No acute pulmonary findings. 4. Aortic atherosclerosis. Aortic Atherosclerosis (ICD10-I70.0). Electronically Signed   By: Rudie Meyer M.D.   On: 01/06/2021 16:34   US Abdomen Limited  Result Date: 01/08/2021 CLINICAL DATA:  Acute onset abdominal pain EXAM: ULTRASOUND ABDOMEN LIMITED RIGHT UPPER QUADRANT COMPARISON:  CT 01/07/2021 FINDINGS: Gallbladder: Moderate sludge in the gallbladder. Shadowing stones measuring up to 7 mm. Negative sonographic Murphy. Increased wall thickness at 5.3 mm. Common bile duct: Diameter: 8.7 mm Liver: No focal lesion identified. Within normal limits in parenchymal echogenicity. Portal vein is patent on color Doppler imaging with normal direction of blood flow towards the liver. Hypodense lesions on CT are not well seen by sonography. Other: None. IMPRESSION: 1. Moderate sludge in the gallbladder with small stones. Increased wall thickness but negative sonographic Eulah Pont, findings are indeterminate for cholecystitis. Consider correlation with nuclear medicine hepatobiliary imaging. 2. Slightly enlarged common bile duct. Recommend correlation with LFTs, further evaluation with MRCP may be obtained if clinically indicated Electronically Signed   By: Jasmine Pang M.D.   On: 01/08/2021 20:53   CT CORONARY MORPH W/CTA COR W/SCORE W/CA W/CM &/OR WO/CM  Addendum Date: 01/08/2021    ADDENDUM REPORT: 01/08/2021 08:07 EXAM: OVER-READ INTERPRETATION  CT CHEST The following report is  an over-read performed by radiologist Dr. Royal Piedra [facility] on 01/08/2021. This over-read does not include interpretation of cardiac or coronary anatomy or pathology. The coronary calcium score and cardiac CTA interpretation by the cardiologist is attached. COMPARISON:  None. FINDINGS: Aortic atherosclerosis. Within the visualized portions of the thorax there are no suspicious appearing pulmonary nodules or masses, there is no acute consolidative airspace disease, no pleural effusions, no pneumothorax and no lymphadenopathy. Visualized portions of the upper abdomen are unremarkable. There are no aggressive appearing lytic or blastic lesions noted in the visualized portions of the skeleton. IMPRESSION: 1.  Aortic Atherosclerosis (ICD10-I70.0). Electronically Signed   By: Trudie Reed M.D.   On: 01/08/2021 08:07   Result Date: 01/08/2021 CLINICAL DATA:  70 Year-old White Male EXAM: Cardiac/Coronary  CTA TECHNIQUE: The patient was scanned on a [employer]. FINDINGS: A 100 kV prospective scan was triggered in the descending thoracic aorta at 111 HU's. Axial non-contrast 3 mm slices were carried out through the heart. The data set was analyzed on a dedicated work station and scored using the Agatson method. Gantry rotation speed was 250 msecs and collimation was .6 mm. No beta blockade and 0.8 mg of sl NTG was given. The 3D data set was reconstructed in 5% intervals of the 67-82 % of the R-R cycle. Diastolic phases were analyzed on a dedicated work station using MPR, MIP and VRT modes. The patient received 80 cc of contrast. Aorta: Upper limit of normal: 38 mm. Aortic Atherosclerosis noted. No dissection. Aortic Valve:  Tri-leaflet.  Annular calcification noted. Coronary Arteries:  Normal coronary origin.  Right dominance. Coronary calcium score of 36. This was 29th percentile for  age, sex, and race matched control. RCA is a large dominant artery that gives rise to PDA and PLA. There is no plaque. Left main is a large artery that gives rise to LAD and LCX arteries. There is no plaque. LAD is a large vessel that gives off a D1 vessel. There is a minimal, non-obstructive calcified plaque (1-24%) in the proximal LAD. LCX is a non-dominant artery that gives rise to two small OM branches. There is no plaque. Other findings: Normal pulmonary vein drainage into the left atrium. Normal left atrial appendage without a thrombus. Normal size of the pulmonary artery. Extra-cardiac findings: See attached radiology report for non-cardiac structures. IMPRESSION: 1. Coronary calcium score of 36. This was 29th percentile for age, sex, and race matched control. 2. Normal coronary origin with right dominance. 3. Aortic Atherosclerosis noted. 4. CAD-RADS 1. 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 Consider non-atherosclerotic causes of chest pain. Consider preventive therapy and risk factor modification. Electronically Signed: By: Riley Lam MD On: 01/07/2021 14:30   DG Chest Port 1 View  Result Date: 01/06/2021 CLINICAL DATA:  Shortness of breath EXAM: PORTABLE CHEST 1 VIEW COMPARISON:  12/21/2020 FINDINGS: The heart size and mediastinal contours are within normal limits. Both lungs are clear. Status post bilateral reverse shoulder arthroplasty. IMPRESSION: No acute abnormality of the lungs in AP portable projection. Electronically Signed   By: Lauralyn Primes M.D.   On: 01/06/2021 14:04   DG ERCP BILIARY & PANCREATIC DUCTS  Result Date: 01/10/2021 CLINICAL DATA:  70 year old male with a history of cholelithiasis/choledocholithiasis EXAM: ERCP TECHNIQUE: Multiple spot images obtained with the fluoroscopic device and submitted for interpretation post-procedure. FLUOROSCOPY TIME:  Fluoroscopy Time:  1 minutes 52 seconds COMPARISON:  None. FINDINGS: Limited intraoperative fluoroscopic  spot images during ERCP. Initial  image demonstrates endoscope projecting over the upper abdomen. There is subsequently placement of a safety wire and retrograde infusion of contrast, partially opacifying the extrahepatic biliary ducts. Vague filling defects within the distal common bile duct are present. There is then deployment of a retrieval balloon. IMPRESSION: Limited images during ERCP demonstrates deployment of a retrieval balloon for treatment of choledocholithiasis. Please refer to the dictated operative report for full details of intraoperative findings and procedure. Electronically Signed   By: Gilmer MorJaime  Wagner D.O.   On: 01/10/2021 12:45   ECHOCARDIOGRAM COMPLETE  Result Date: 01/07/2021    ECHOCARDIOGRAM REPORT   Patient Name:   Donalynn FurlongRichard K Curiale Date of Exam: 01/07/2021 Medical Rec #:  [id number]          Height:       70.0 in Accession #:    [id number]         Weight:       221.7 lb Date of Birth:  05/12/1951          BSA:          2.181 m Patient Age:    69 years           BP:           118/85 mmHg Patient Gender: M                  HR:           58 bpm. Exam Location:  Inpatient Procedure: 2D Echo, Color Doppler and Cardiac Doppler Indications:    Acute Ischemic Heart Disease i24.9  History:        Patient has prior history of Echocardiogram examinations, most                 recent 01/29/2018. Arrythmias:Atrial Fibrillation; Risk                 Factors:Hypertension and Dyslipidemia.  Sonographer:    Irving BurtonEmily Senior RDCS Referring Phys: [id number] MATTHEW A CARLISLE IMPRESSIONS  1. Left ventricular ejection fraction, by estimation, is 50 to 55%. The left ventricle has low normal function. The left ventricle has no regional wall motion abnormalities. Left ventricular diastolic parameters were normal.  2. Right ventricular systolic function is normal. The right ventricular size is normal.  3. The mitral valve is grossly normal. No evidence of mitral valve regurgitation.  4. The aortic valve is tricuspid.  There is mild calcification of the aortic valve. There is mild thickening of the aortic valve. Aortic valve regurgitation is not visualized. No aortic stenosis is present.  5. Aortic dilatation noted. There is mild to moderate dilatation of the aortic root, measuring 44 mm. There is borderline dilatation of the ascending aorta, measuring 37 mm. There is mild dilatation of the transverse aorta, measuring 37 mm. Comparison(s): A prior study was performed on 01/29/2018. Prior images reviewed side by side. Slight decrease in LV function, aortic dilation noted. FINDINGS  Left Ventricle: Left ventricular ejection fraction, by estimation, is 50 to 55%. The left ventricle has low normal function. The left ventricle has no regional wall motion abnormalities. The left ventricular internal cavity size was small. There is no left ventricular hypertrophy. Left ventricular diastolic parameters were normal. Right Ventricle: The right ventricular size is normal. No increase in right ventricular wall thickness. Right ventricular systolic function is normal. Left Atrium: Left atrial size was normal in size. Right Atrium: Right atrial size was normal in size. Normal Area- Volume is discordant with visual estimates.  Pericardium: Trivial pericardial effusion is present. Mitral Valve: The mitral valve is grossly normal. No evidence of mitral valve regurgitation. Tricuspid Valve: The tricuspid valve is not well visualized. Tricuspid valve regurgitation is not demonstrated. Aortic Valve: The aortic valve is tricuspid. There is mild calcification of the aortic valve. There is mild thickening of the aortic valve. There is mild aortic valve annular calcification. Aortic valve regurgitation is not visualized. No aortic stenosis  is present. Pulmonic Valve: The pulmonic valve was not well visualized. Pulmonic valve regurgitation is not visualized. No evidence of pulmonic stenosis. Aorta: Aortic dilatation noted. There is mild to moderate  dilatation of the aortic root, measuring 44 mm. There is borderline dilatation of the ascending aorta, measuring 37 mm. There is mild dilatation of the transverse aorta, measuring 37 mm. IAS/Shunts: The atrial septum is grossly normal.  LEFT VENTRICLE PLAX 2D LVIDd:         3.80 cm  Diastology LVIDs:         2.70 cm  LV e' medial:    8.92 cm/s LV PW:         1.30 cm  LV E/e' medial:  6.3 LV IVS:        1.60 cm  LV e' lateral:   11.00 cm/s LVOT diam:     2.40 cm  LV E/e' lateral: 5.1 LV SV:         81 LV SV Index:   37 LVOT Area:     4.52 cm  RIGHT VENTRICLE RV S prime:     10.70 cm/s TAPSE (M-mode): 2.4 cm LEFT ATRIUM           Index       RIGHT ATRIUM           Index LA diam:      3.60 cm 1.65 cm/m  RA Area:     38.70 cm LA Vol (A4C): 52.6 ml 24.12 ml/m RA Volume:   162.00 ml 74.29 ml/m  AORTIC VALVE LVOT Vmax:   87.50 cm/s LVOT Vmean:  65.900 cm/s LVOT VTI:    0.178 m  AORTA Ao Root diam: 4.40 cm Ao Asc diam:  3.70 cm MITRAL VALVE MV Area (PHT): 2.42 cm    SHUNTS MV Decel Time: 313 msec    Systemic VTI:  0.18 m MV E velocity: 56.60 cm/s  Systemic Diam: 2.40 cm MV A velocity: 25.30 cm/s MV E/A ratio:  2.24 Riley Lam MD Electronically signed by Riley Lam MD Signature Date/Time: 01/07/2021/12:53:44 PM    Final       Subjective: - no chest pain, shortness of breath, no abdominal pain, nausea or vomiting.   Discharge Exam: BP 122/82 (BP Location: Right Arm)   Pulse 70   Temp 98.4 F (36.9 C) (Oral)   Resp 18   Ht 5\' 10"  (1.778 m)   Wt 100.6 kg   SpO2 97%   BMI 31.81 kg/m   General: Pt is alert, awake, not in acute distress    The results of significant diagnostics from this hospitalization (including imaging, microbiology, ancillary and laboratory) are listed below for reference.     Microbiology: Recent Results (from the past 240 hour(s))  Resp Panel by RT-PCR (Flu A&B, Covid) Nasopharyngeal Swab     Status: None   Collection Time: 01/06/21  1:02 PM   Specimen:  Nasopharyngeal Swab; Nasopharyngeal(NP) swabs in vial transport medium  Result Value Ref Range Status   SARS Coronavirus 2 by RT PCR NEGATIVE NEGATIVE Final  Comment: (NOTE) SARS-CoV-2 target nucleic acids are NOT DETECTED.  The SARS-CoV-2 RNA is generally detectable in upper respiratory specimens during the acute phase of infection. The lowest concentration of SARS-CoV-2 viral copies this assay can detect is 138 copies/mL. A negative result does not preclude SARS-Cov-2 infection and should not be used as the sole basis for treatment or other patient management decisions. A negative result may occur with  improper specimen collection/handling, submission of specimen other than nasopharyngeal swab, presence of viral mutation(s) within the areas targeted by this assay, and inadequate number of viral copies(<138 copies/mL). A negative result must be combined with clinical observations, patient history, and epidemiological information. The expected result is Negative.  Fact Sheet for Patients:  BloggerCourse.com  Fact Sheet for Healthcare Providers:  SeriousBroker.it  This test is no t yet approved or cleared by the [place] FDA and  has been authorized for detection and/or diagnosis of SARS-CoV-2 by FDA under an Emergency Use Authorization (EUA). This EUA will remain  in effect (meaning this test can be used) for the duration of the COVID-19 declaration under Section 564(b)(1) of the Act, 21 U.S.C.section 360bbb-3(b)(1), unless the authorization is terminated  or revoked sooner.       Influenza A by PCR NEGATIVE NEGATIVE Final   Influenza B by PCR NEGATIVE NEGATIVE Final    Comment: (NOTE) The Xpert Xpress SARS-CoV-2/FLU/RSV plus assay is intended as an aid in the diagnosis of influenza from Nasopharyngeal swab specimens and should not be used as a sole basis for treatment. Nasal washings and aspirates are unacceptable for  Xpert Xpress SARS-CoV-2/FLU/RSV testing.  Fact Sheet for Patients: BloggerCourse.com  Fact Sheet for Healthcare Providers: SeriousBroker.it  This test is not yet approved or cleared by the [place] FDA and has been authorized for detection and/or diagnosis of SARS-CoV-2 by FDA under an Emergency Use Authorization (EUA). This EUA will remain in effect (meaning this test can be used) for the duration of the COVID-19 declaration under Section 564(b)(1) of the Act, 21 U.S.C. section 360bbb-3(b)(1), unless the authorization is terminated or revoked.  Performed at [facility], [address] [place], [place] [address]   Surgical pcr screen     Status: None   Collection Time: 01/10/21  9:15 PM   Specimen: Nasal Mucosa; Nasal Swab  Result Value Ref Range Status   MRSA, PCR NEGATIVE NEGATIVE Final   Staphylococcus aureus NEGATIVE NEGATIVE Final    Comment: (NOTE) The Xpert SA Assay (FDA approved for NASAL specimens in patients 26 years of age and older), is one component of a comprehensive surveillance program. It is not intended to diagnose infection nor to guide or monitor treatment. Performed at [facility], [address], [place], [place] [address]      Labs: Basic Metabolic Panel: Recent Labs  Lab 01/06/21 1302 01/07/21 0457 01/09/21 0143 01/10/21 0111 01/11/21 0308  NA 139 139 138 137 138  K 3.9 4.1 3.8 3.8 3.9  CL 104 105 103 104 105  CO2 GLUCOSE 164* 125* 98 107* 131*  BUN CREATININE 1.10 1.02 1.02 0.91 0.97  CALCIUM 9.1 8.9 8.8* 8.8* 8.7*  MG 2.2  --   --   --   --    Liver Function Tests: Recent Labs  Lab 01/08/21 1300 01/09/21 0143 01/10/21 0111 01/11/21 0308  AST 322* 248* 107* 50*  ALT 492* 440* 290* 186*  ALKPHOS 155* 176* 189*  144*  BILITOT 5.6* 6.0* 2.8* 1.8*  PROT 6.0* 5.7* 5.8* 5.5*  ALBUMIN 3.2* 2.9* 2.8* 2.5*   CBC: Recent Labs  Lab  01/07/21 0457 01/09/21 0143 01/10/21 0111 01/11/21 0308 01/12/21 0014  WBC 4.1 6.0 6.9 6.5 6.9  HGB 14.2 12.6* 13.6 12.9* 13.1  HCT 40.0 37.8* 37.7* 36.2* 38.1*  MCV 88.7 92.4 87.7 87.7 89.9  PLT 143* 134* 145* 164 198   CBG: Recent Labs  Lab 01/11/21 1032  GLUCAP 122*   Hgb A1c No results for input(s): HGBA1C in the last 72 hours. Lipid Profile No results for input(s): CHOL, HDL, LDLCALC, TRIG, CHOLHDL, LDLDIRECT in the last 72 hours. Thyroid function studies No results for input(s): TSH, T4TOTAL, T3FREE, THYROIDAB in the last 72 hours.  Invalid input(s): FREET3 Urinalysis    Component Value Date/Time   COLORURINE YELLOW 12/21/2020 1233   APPEARANCEUR CLEAR 12/21/2020 1233   LABSPEC 1.026 12/21/2020 1233   PHURINE 5.0 12/21/2020 1233   GLUCOSEU NEGATIVE 12/21/2020 1233   GLUCOSEU NEGATIVE 12/20/2020 1000   HGBUR NEGATIVE 12/21/2020 1233   BILIRUBINUR NEGATIVE 12/21/2020 1233   KETONESUR 5 (A) 12/21/2020 1233   PROTEINUR NEGATIVE 12/21/2020 1233   UROBILINOGEN 0.2 12/20/2020 1000   NITRITE NEGATIVE 12/21/2020 1233   LEUKOCYTESUR NEGATIVE 12/21/2020 1233    FURTHER DISCHARGE INSTRUCTIONS:   Get Medicines reviewed and adjusted: Please take all your medications with you for your next visit with your Primary MD   Laboratory/radiological data: Please request your Primary MD to go over all hospital tests and procedure/radiological results at the follow up, please ask your Primary MD to get all Hospital records sent to his/her office.   In some cases, they will be blood work, cultures and biopsy results pending at the time of your discharge. Please request that your primary care M.D. goes through all the records of your hospital data and follows up on these results.   Also Note the following: If you experience worsening of your admission symptoms, develop shortness of breath, life threatening emergency, suicidal or homicidal thoughts you must seek medical attention  immediately by calling 911 or calling your MD immediately  if symptoms less severe.   You must read complete instructions/literature along with all the possible adverse reactions/side effects for all the Medicines you take and that have been prescribed to you. Take any new Medicines after you have completely understood and accpet all the possible adverse reactions/side effects.    Do not drive when taking Pain medications or sleeping medications (Benzodaizepines)   Do not take more than prescribed Pain, Sleep and Anxiety Medications. It is not advisable to combine anxiety,sleep and pain medications without talking with your primary care practitioner   Special Instructions: If you have smoked or chewed Tobacco  in the last 2 yrs please stop smoking, stop any regular Alcohol  and or any Recreational drug use.   Wear Seat belts while driving.   Please note: You were cared for by a hospitalist during your hospital stay. Once you are discharged, your primary care physician will handle any further medical issues. Please note that NO REFILLS for any discharge medications will be authorized once you are discharged, as it is imperative that you return to your primary care physician (or establish a relationship with a primary care physician if you do not have one) for your post hospital discharge needs so that they can reassess your need for medications and monitor your lab values.  Time coordinating discharge: 25 minutes  SIGNED:  Pamella Pert, MD, PhD 01/12/2021, 8:08 AM

## 2021-01-12 NOTE — Care Management Important Message (Signed)
Important Message  Patient Details  Name: Todd Barrett. MRN: [id number] Date of Birth: 06-26-1951   Medicare Important Message Given:  Yes     Ninfa Giannelli P Aden Youngman 01/12/2021, 1:21 PM

## 2021-01-14 ENCOUNTER — Encounter (HOSPITAL_COMMUNITY): Payer: Self-pay | Admitting: Gastroenterology

## 2021-01-16 ENCOUNTER — Ambulatory Visit (HOSPITAL_COMMUNITY): Payer: Medicare Other | Admitting: Physician Assistant

## 2021-01-30 ENCOUNTER — Encounter (HOSPITAL_COMMUNITY): Payer: Self-pay | Admitting: Physician Assistant

## 2021-01-30 ENCOUNTER — Telehealth: Payer: Self-pay | Admitting: *Deleted

## 2021-01-30 ENCOUNTER — Ambulatory Visit (HOSPITAL_COMMUNITY)
Admit: 2021-01-30 | Discharge: 2021-01-30 | Disposition: A | Discharge status: Home / Self Care | Payer: Medicare Other | Attending: Nurse Practitioner | Admitting: Nurse Practitioner

## 2021-01-30 ENCOUNTER — Other Ambulatory Visit: Payer: Self-pay

## 2021-01-30 VITALS — BP 122/76 | HR 69 | Ht 70.0 in | Wt 222.8 lb

## 2021-01-30 DIAGNOSIS — Z7901 Long term (current) use of anticoagulants: Secondary | ICD-10-CM | POA: Diagnosis not present

## 2021-01-30 DIAGNOSIS — R0683 Snoring: Secondary | ICD-10-CM | POA: Insufficient documentation

## 2021-01-30 DIAGNOSIS — I480 Paroxysmal atrial fibrillation: Secondary | ICD-10-CM | POA: Diagnosis not present

## 2021-01-30 DIAGNOSIS — Z9049 Acquired absence of other specified parts of digestive tract: Secondary | ICD-10-CM | POA: Insufficient documentation

## 2021-01-30 DIAGNOSIS — Z96651 Presence of right artificial knee joint: Secondary | ICD-10-CM | POA: Insufficient documentation

## 2021-01-30 DIAGNOSIS — Z96611 Presence of right artificial shoulder joint: Secondary | ICD-10-CM | POA: Diagnosis not present

## 2021-01-30 DIAGNOSIS — E785 Hyperlipidemia, unspecified: Secondary | ICD-10-CM | POA: Diagnosis not present

## 2021-01-30 DIAGNOSIS — E669 Obesity, unspecified: Secondary | ICD-10-CM | POA: Insufficient documentation

## 2021-01-30 DIAGNOSIS — R0681 Apnea, not elsewhere classified: Secondary | ICD-10-CM | POA: Insufficient documentation

## 2021-01-30 DIAGNOSIS — Z6831 Body mass index (BMI) 31.0-31.9, adult: Secondary | ICD-10-CM | POA: Diagnosis not present

## 2021-01-30 DIAGNOSIS — Z79899 Other long term (current) drug therapy: Secondary | ICD-10-CM | POA: Insufficient documentation

## 2021-01-30 DIAGNOSIS — D6869 Other thrombophilia: Secondary | ICD-10-CM | POA: Insufficient documentation

## 2021-01-30 DIAGNOSIS — I10 Essential (primary) hypertension: Secondary | ICD-10-CM | POA: Insufficient documentation

## 2021-01-30 DIAGNOSIS — Z981 Arthrodesis status: Secondary | ICD-10-CM | POA: Diagnosis not present

## 2021-01-30 MED ORDER — APIXABAN 5 MG PO TABS: 5.0000 mg | ORAL_TABLET | Freq: Two times a day (BID) | ORAL | 11 refills | Status: DC

## 2021-01-30 NOTE — Telephone Encounter (Signed)
Staff message sent to Nina ok to schedule sleep study. Patient has Medicare and does not require a PA. 

## 2021-01-30 NOTE — Progress Notes (Signed)
Primary Care Physician: Corwin LevinsJohn, [name], MD Primary Cardiologist: Dr Eldridge DaceVaranasi  Primary Electrophysiologist: none Referring Physician: Redge GainerMoses Sciotod[place]   Brooke Paceichard K Friesz Jr. is a 70 y.o. male with a history of HLD, HTN, OSA, atrial fibrillation who [facility]. The patient was initially diagnosed with atrial fibrillation in 2019 after a total knee replacement when he was noted to have a fast, irregular heart rate while working with physical therapy. He was treated with Cardizem and then placed on Xarelto.  Cardioversion was planned but he went back into normal sinus rhythm prior to hospital discharge. He was later taken off of both Xarelto and Cardizem when he had no recurrence of AF it was presumed to be only in the postop period.  He later had an outpatient Holter monitor which did not reveal recurrence of atrial fibrillation.   Patient was seen at the ED 12/21/20 with abdominal pain and was found to be in rate controlled afib. He was started on Eliquis for a CHADS2VASC score of 2. He returned to the ED 01/06/21 with chest pain and was admitted. Workup negative for ACS but he was diagnosed with acute gallstone pancreatitis and underwent cholecystectomy. He converted to SR spontaneously during admission.   Today, patient reports he has done well from a cardiac standpoint. He was unaware of his afib while in the hospital. He denies any bleeding issues on anticoagulation. He drinks about 10 alcohol drinks per month but admits he had more to drink while on vacation just prior to the onset of his symptoms. He does admit to snoring and witnessed apnea.   Today, he denies symptoms of palpitations, chest pain, shortness of breath, orthopnea, PND, lower extremity edema, dizziness, presyncope, syncope, bleeding, or neurologic sequela. The patient is tolerating medications without difficulties and is otherwise without complaint today.    Atrial  Fibrillation Risk Factors:  he does have symptoms or diagnosis of sleep apnea. he does not have a history of rheumatic fever. he does have a history of alcohol use. The patient does not have a history of early familial atrial fibrillation or other arrhythmias.  he has a BMI of Body mass index is 31.97 kg/m.Marland Kitchen. Filed Weights   01/30/21 1322  Weight: 101.1 kg    Family History  Problem Relation Age of Onset  . Bradycardia Father 60       Pacemaker  . Lymphoma Mother   . Alzheimer's disease Mother   . Pancreatic cancer Brother   . Colon cancer Neg Hx      Atrial Fibrillation Management history:  Previous antiarrhythmic drugs: none Previous cardioversions: none Previous ablations: none CHADS2VASC score: 2 Anticoagulation history: Xarelto, Eliquis   Past Medical History:  Diagnosis Date  . ALLERGIC RHINITIS 01/08/2010  . Allergy    dust mites, pet dander  . ANEMIA-NOS 01/08/2010  . Atrial fibrillation with RVR (HCC) 01/19/2018  . CERVICAL RADICULOPATHY, RIGHT 01/08/2010  . CHEST PAIN 01/08/2010  . DIVERTICULOSIS, COLON 01/08/2010  . Dysrhythmia    post op   during PT a fib RVR  . GERD (gastroesophageal reflux disease)    Zantac OTC  prn  . GOUT 01/08/2010   in past/ when has surgery  . HYPERLIPIDEMIA 01/08/2010  . Lumbar disc disease 08/05/2011  . Osteoarthritis of right knee    end stage  . OSTEOARTHRITIS, KNEES, 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   Past Surgical History:  Procedure Laterality Date  . ANTERIOR CERVICAL DECOMP/DISCECTOMY FUSION  07/23/2012  Procedure: ANTERIOR CERVICAL DECOMPRESSION/DISCECTOMY FUSION 2 LEVELS;  Surgeon: Clydene Fake, MD;  Location: [facility] NEURO ORS;  Service: Neurosurgery;  Laterality: N/A;  Cervical three-four, four-five, redo Cervical six-seven anterior cervical decompression, discectomy fusion with allograft and plating  . BACK SURGERY  03/2004, 02/2005  . CERVICAL SPINE SURGERY  05/2010  . CHOLECYSTECTOMY N/A 01/11/2021   Procedure: LAPAROSCOPIC  CHOLECYSTECTOMY;  Surgeon: Axel Filler, MD;  Location: [facility] OR;  Service: General;  Laterality: N/A;  . clavicle facture  nov. 2009   100% displacement  . COLONOSCOPY    . ERCP N/A 01/10/2021   Procedure: ENDOSCOPIC RETROGRADE CHOLANGIOPANCREATOGRAPHY (ERCP);  Surgeon: Meryl Dare, MD;  Location: [facility] ENDOSCOPY;  Service: Endoscopy;  Laterality: N/A;  . HERNIA REPAIR     inguinal hernia/right side/ 70 years old  . JOINT REPLACEMENT     Left knee 2006  . KNEE ARTHROSCOPY     total of 5  . left knee arthroscopy  september 2005  . lumbar disc surgury  02/2002  . lumbar disc surgury  june 2004  . NASAL RECONSTRUCTION     x 3  . REMOVAL OF STONES  01/10/2021   Procedure: REMOVAL OF STONES;  Surgeon: Meryl Dare, MD;  Location: [facility] ENDOSCOPY;  Service: Endoscopy;;  . REVERSE SHOULDER ARTHROPLASTY Left 07/30/2018   Procedure: LEFT REVERSE SHOULDER ARTHROPLASTY;  Surgeon: Francena Hanly, MD;  Location: [facility] OR;  Service: Orthopedics;  Laterality: Left;   . s/p ENT surgury     x 3  . s/p left shoulder surgury  feb. 2009  . s/p left TKR  april 2006  . SPHINCTEROTOMY  01/10/2021   Procedure: SPHINCTEROTOMY;  Surgeon: Meryl Dare, MD;  Location: [facility] ENDOSCOPY;  Service: Endoscopy;;  . TOTAL KNEE ARTHROPLASTY Right 01/16/2018   Procedure: RIGHT TOTAL KNEE ARTHROPLASTY;  Surgeon: Beverely Low, MD;  Location: [facility] OR;  Service: Orthopedics;  Laterality: Right;  . TOTAL SHOULDER ARTHROPLASTY Right 09/16/2019   Procedure: Right Reverse shoulder arthroplasty;  Surgeon: Francena Hanly, MD;  Location: [facility] ORS;  Service: Orthopedics;  Laterality: Right;    Current Outpatient Medications  Medication Sig Dispense Refill  . acetaminophen (TYLENOL) 500 MG tablet Take 2 tablets (1,000 mg total) by mouth every 6 (six) hours as needed. 30 tablet 0  . atorvastatin (LIPITOR) 10 MG tablet Take 1 tablet (10 mg total) by mouth daily. 90 tablet 3  . Calcium Polycarbophil (FIBER-CAPS PO) Take 2 tablets by  mouth daily.    . fexofenadine (ALLEGRA) 180 MG tablet Take 180 mg by mouth in the morning and at bedtime.    . fluticasone (FLONASE) 50 MCG/ACT nasal spray Place 2 sprays into the nose daily as needed for allergies. For allergies.    Marland Kitchen HYDROcodone-acetaminophen (NORCO/VICODIN) 5-325 MG tablet Take 1 tablet by mouth every 4 (four) hours as needed.    Marland Kitchen lisinopril (ZESTRIL) 10 MG tablet Take 1 tablet (10 mg total) by mouth daily. 90 tablet 3  . naproxen (NAPROSYN) 500 MG tablet Take 1 tablet (500 mg total) by mouth 2 (two) times daily with a meal. 60 tablet 1  . Omega-3 Fatty Acids (FISH OIL) 1200 MG CPDR Take 3,600 mg by mouth daily.    . penicillin v potassium (VEETID) 500 MG tablet Take 500 mg by mouth once as needed (pre-dental appointment/surgery).    Marland Kitchen VITAMIN D PO Take 2 tablets by mouth daily.    Marland Kitchen apixaban (ELIQUIS) 5 MG TABS tablet Take 1 tablet (5 mg total)  by mouth 2 (two) times daily. (Patient not taking: Reported on 01/30/2021) 60 tablet 0   No current facility-administered medications for this encounter.    Allergies  Allergen Reactions  . Dust Mite Extract Other (See Comments)    Nasal Congestion  . Drug Ingredient [Bee Pollen] Other (See Comments)    Congestion  . Other Other (See Comments)    Pet Dander- Runny nose    Social History   Socioeconomic History  . Marital status: Married    Spouse name: Not on file  . Number of children: Not on file  . Years of education: Not on file  . Highest education level: Not on file  Occupational History  . Occupation: owns Primary school teacher in [employer]    Employer: [employer]  Tobacco Use  . Smoking status: Never Smoker  . Smokeless tobacco: Never Used  Vaping Use  . Vaping Use: Never used  Substance and Sexual Activity  . Alcohol use: Not Currently    Alcohol/week: 12.0 standard drinks    Types: 12 Cans of beer per week    Comment: 3-4 days a week  . Drug use: Yes    Types: Marijuana    Comment: daily  . Sexual activity: Yes   Other Topics Concern  . Not on file  Social History Narrative  . Not on file   Social Determinants of Health   Financial Resource Strain: Low Risk   . Difficulty of Paying Living Expenses: Not hard at all  Food Insecurity: No Food Insecurity  . Worried About Programme researcher, broadcasting/film/video in the Last Year: Never true  . Ran Out of Food in the Last Year: Never true  Transportation Needs: No Transportation Needs  . Lack of Transportation (Medical): No  . Lack of Transportation (Non-Medical): No  Physical Activity: Sufficiently Active  . Days of Exercise per Week: 5 days  . Minutes of Exercise per Session: 30 min  Stress: No Stress Concern Present  . Feeling of Stress : Not at all  Social Connections: Socially Integrated  . Frequency of Communication with Friends and Family: More than three times a week  . Frequency of Social Gatherings with Friends and Family: More than three times a week  . Attends Religious Services: 1 to 4 times per year  . Active Member of Clubs or Organizations: Yes  . Attends Banker Meetings: 1 to 4 times per year  . Marital Status: Married  Catering manager Violence: Not on file     ROS- All systems are reviewed and negative except as per the HPI above.  Physical Exam: Vitals:   01/30/21 1322  BP: 122/76  Pulse: 69  Weight: 101.1 kg  Height: 5\' 10"  (1.778 m)    GEN- The patient is a well appearing obese male, alert and oriented x 3 today.   Head- normocephalic, atraumatic Eyes-  Sclera clear, conjunctiva pink Ears- hearing intact Oropharynx- clear Neck- supple  Lungs- Clear to ausculation bilaterally, normal work of breathing Heart- Regular rate and rhythm, no murmurs, rubs or gallops  GI- soft, NT, ND, + BS Extremities- no clubbing, cyanosis, or edema MS- no significant deformity or atrophy Skin- no rash or lesion Psych- euthymic mood, full affect Neuro- strength and sensation are intact  Wt Readings from Last 3 Encounters:   01/30/21 101.1 kg  01/07/21 100.6 kg  12/25/20 103 kg    EKG today demonstrates  SR Vent. rate 69 BPM PR interval 182 ms QRS duration 90 ms  QT/QTc 380/407 ms  Echo 01/07/21 demonstrated  1. Left ventricular ejection fraction, by estimation, is 50 to 55%. The  left ventricle has low normal function. The left ventricle has no regional  wall motion abnormalities. Left ventricular diastolic parameters were  normal.  2. Right ventricular systolic function is normal. The right ventricular  size is normal.  3. The mitral valve is grossly normal. No evidence of mitral valve  regurgitation.  4. The aortic valve is tricuspid. There is mild calcification of the  aortic valve. There is mild thickening of the aortic valve. Aortic valve  regurgitation is not visualized. No aortic stenosis is present.  5. Aortic dilatation noted. 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 There is borderline dilatation of the  ascending aorta, measuring 37 mm. There is mild dilatation of the  transverse aorta, measuring 37 mm.   Comparison(s): A prior study was performed on 01/29/2018. Prior images  reviewed side by side. Slight decrease in LV function, aortic dilation  noted.   Epic records are reviewed at length today  CHA2DS2-VASc Score = 2  The patient's score is based upon: CHF History: No HTN History: Yes Diabetes History: No Stroke History: No Vascular Disease History: No Age Score: 1 Gender Score: 0      ASSESSMENT AND PLAN: 1. Paroxysmal Atrial Fibrillation (ICD10:  I48.0) The patient's CHA2DS2-VASc score is 2, indicating a 2.2% annual risk of stroke.   In setting of acute pancreatitis. Patient appears to be maintaining SR. Continue Eliquis 5 mg BID  2. Secondary Hypercoagulable State (ICD10:  D68.69) The patient is at significant risk for stroke/thromboembolism based upon his CHA2DS2-VASc Score of 2.  Continue Apixaban (Eliquis).   3.  Obesity Body mass index is 31.97 kg/m. Lifestyle modification was discussed at length including regular exercise and weight reduction.  4. Snoring/witnessed apnea The importance of adequate treatment of sleep apnea was discussed today in order to improve our ability to maintain sinus rhythm long term. Will refer for sleep study.  5. HTN Stable, no changes today.   Follow up with Dr Eldridge Dace per recall.    Jorja Loa PA-C [facility] [address] [place], [place] [address] [phone] 01/30/2021 1:29 PM

## 2021-02-06 DIAGNOSIS — H2513 Age-related nuclear cataract, bilateral: Secondary | ICD-10-CM | POA: Diagnosis not present

## 2021-02-08 ENCOUNTER — Telehealth: Payer: Self-pay | Admitting: *Deleted

## 2021-02-08 NOTE — Telephone Encounter (Signed)
Patient is scheduled for lab study on 03/31/21. Patient understands his sleep study will be done at [facility] sleep lab. Patient understands he will receive a sleep packet in a week or so. Patient understands to call if he does not receive the sleep packet in a timely manner.  Left detailed message on voicemail with date and time of titration and informed patient to call back to confirm or reschedule.

## 2021-02-08 NOTE — Telephone Encounter (Signed)
-----   Message from Gaynelle Cage, CMA sent at 01/30/2021  1:56 PM EST ----- Regarding: RE: sleep study Ok to schedule sleep study. Patient has Medicare and does not require a PA. ----- Message ----- From: Shona Simpson, RN Sent: 01/30/2021   1:46 PM EST To: Cv Div Sleep Studies Subject: sleep study                                    Pt for sleep study for afib, snoring and witnessed apnea per clint fenton Thanks [address]

## 2021-03-31 ENCOUNTER — Encounter (HOSPITAL_BASED_OUTPATIENT_CLINIC_OR_DEPARTMENT_OTHER): Payer: Medicare Other | Admitting: Cardiology

## 2021-04-02 ENCOUNTER — Other Ambulatory Visit: Payer: Self-pay

## 2021-04-02 ENCOUNTER — Ambulatory Visit (HOSPITAL_BASED_OUTPATIENT_CLINIC_OR_DEPARTMENT_OTHER): Payer: Medicare Other | Attending: Physician Assistant | Admitting: Cardiology

## 2021-04-02 DIAGNOSIS — R0683 Snoring: Secondary | ICD-10-CM | POA: Insufficient documentation

## 2021-04-24 NOTE — Procedures (Signed)
    Patient Name: Todd Barrett, Todd Barrett Date: 04/02/2021 Gender: Male D.O.B: 06-14-1951 Age (years): 2 Referring Provider: Alphonzo Severance PA Height (inches): 70 Interpreting Physician: Armanda Magic MD, ABSM Weight (lbs): 210 RPSGT: Armen Pickup BMI: 30 MRN: [id number] Neck Size: 17.00  CLINICAL INFORMATION Sleep Study Type: NPSG  Indication for sleep study: OSA  Epworth Sleepiness Score: 4  SLEEP STUDY TECHNIQUE As per the AASM Manual for the Scoring of Sleep and Associated Events v2.3 (April 2016) with a hypopnea requiring 4% desaturations.  The channels recorded and monitored were frontal, central and occipital EEG, electrooculogram (EOG), submentalis EMG (chin), nasal and oral airflow, thoracic and abdominal wall motion, anterior tibialis EMG, snore microphone, electrocardiogram, and pulse oximetry.  MEDICATIONS Medications self-administered by patient taken the night of the study : N/A  SLEEP ARCHITECTURE The study was initiated at 10:09:02 PM and ended at 4:23:59 AM.  Sleep onset time was 9.4 minutes and the sleep efficiency was 87.5%. The total sleep time was 328 minutes.  Stage REM latency was 104.5 minutes.  The patient spent 5.9% of the night in stage N1 sleep, 71.3% in stage N2 sleep, 0.0% in stage N3 and 22.7% in REM.  Alpha intrusion was absent.  Supine sleep was 25.30%.  RESPIRATORY PARAMETERS The overall apnea/hypopnea index (AHI) was 0.5 per hour. There were 2 total apneas, including 1 obstructive, 1 central and 0 mixed apneas. There were 1 hypopneas and 29 RERAs.  The AHI during Stage REM sleep was 0.8 per hour.  AHI while supine was 0.7 per hour.  The mean oxygen saturation was 94.7%. The minimum SpO2 during sleep was 90.0%.  moderate snoring was noted during this study.  CARDIAC DATA The 2 lead EKG demonstrated sinus rhythm. The mean heart rate was 49.8 beats per minute. Other EKG findings include: PACs  LEG MOVEMENT DATA The total PLMS  were 0 with a resulting PLMS index of 0.0. Associated arousal with leg movement index was 1.3 .  IMPRESSIONS - No significant obstructive sleep apnea occurred during this study (AHI = 0.5/h). - No significant central sleep apnea occurred during this study (CAI = 0.2/h). - The patient had minimal or no oxygen desaturation during the study (Min O2 = 90.0%) - The patient snored with moderate snoring volume. - PACs were noted during this study. - Clinically significant periodic limb movements did not occur during sleep. No significant associated arousals.  DIAGNOSIS - Normal Study  RECOMMENDATIONS - Avoid alcohol, sedatives and other CNS depressants that may worsen sleep apnea and disrupt normal sleep architecture. - Sleep hygiene should be reviewed to assess factors that may improve sleep quality. - Weight management and regular exercise should be initiated or continued if appropriate.  [Electronically signed] 04/24/2021 10:11 AM  Armanda Magic MD, ABSM Diplomate, American Board of Sleep Medicine

## 2021-04-30 ENCOUNTER — Telehealth: Payer: Self-pay | Admitting: *Deleted

## 2021-04-30 NOTE — Telephone Encounter (Signed)
-----   Message from Quintella Reichert, MD sent at 04/24/2021 10:12 AM EDT ----- Please let patient know that sleep study showed no significant sleep apnea.

## 2021-04-30 NOTE — Telephone Encounter (Addendum)
The patient has been notified of the result and verbalized understanding.  All questions (if any) were answered. Latrelle Dodrill, CMA 04/30/2021 1:45 PM  lmtcb Pt is aware of his normal results.

## 2021-05-09 DIAGNOSIS — Z23 Encounter for immunization: Secondary | ICD-10-CM | POA: Diagnosis not present

## 2021-07-10 ENCOUNTER — Other Ambulatory Visit (HOSPITAL_COMMUNITY): Payer: Self-pay

## 2021-07-10 MED ORDER — APIXABAN 5 MG PO TABS: 5.0000 mg | ORAL_TABLET | Freq: Two times a day (BID) | ORAL | 0 refills | Status: DC

## 2021-09-25 DIAGNOSIS — Z23 Encounter for immunization: Secondary | ICD-10-CM | POA: Diagnosis not present

## 2021-12-10 DIAGNOSIS — M25551 Pain in right hip: Secondary | ICD-10-CM | POA: Insufficient documentation

## 2021-12-12 DIAGNOSIS — M7062 Trochanteric bursitis, left hip: Secondary | ICD-10-CM | POA: Insufficient documentation

## 2021-12-12 DIAGNOSIS — M1612 Unilateral primary osteoarthritis, left hip: Secondary | ICD-10-CM | POA: Diagnosis not present

## 2021-12-12 DIAGNOSIS — M25552 Pain in left hip: Secondary | ICD-10-CM | POA: Diagnosis not present

## 2021-12-21 ENCOUNTER — Encounter: Payer: Self-pay | Admitting: Internal Medicine

## 2021-12-21 ENCOUNTER — Ambulatory Visit (INDEPENDENT_AMBULATORY_CARE_PROVIDER_SITE_OTHER): Payer: Medicare Other | Admitting: Internal Medicine

## 2021-12-21 ENCOUNTER — Other Ambulatory Visit: Payer: Self-pay

## 2021-12-21 VITALS — BP 128/68 | HR 59 | Temp 97.9°F | Ht 70.0 in | Wt 217.2 lb

## 2021-12-21 DIAGNOSIS — I10 Essential (primary) hypertension: Secondary | ICD-10-CM

## 2021-12-21 DIAGNOSIS — E538 Deficiency of other specified B group vitamins: Secondary | ICD-10-CM

## 2021-12-21 DIAGNOSIS — N320 Bladder-neck obstruction: Secondary | ICD-10-CM

## 2021-12-21 DIAGNOSIS — E559 Vitamin D deficiency, unspecified: Secondary | ICD-10-CM | POA: Diagnosis not present

## 2021-12-21 DIAGNOSIS — R739 Hyperglycemia, unspecified: Secondary | ICD-10-CM

## 2021-12-21 DIAGNOSIS — E7800 Pure hypercholesterolemia, unspecified: Secondary | ICD-10-CM | POA: Diagnosis not present

## 2021-12-21 LAB — CBC WITH DIFFERENTIAL/PLATELET
Basophils Absolute: 0 10*3/uL (ref 0.0–0.1)
Basophils Relative: 0.7 % (ref 0.0–3.0)
Eosinophils Absolute: 0.1 10*3/uL (ref 0.0–0.7)
Eosinophils Relative: 1.9 % (ref 0.0–5.0)
HCT: 44 % (ref 39.0–52.0)
Hemoglobin: 14.8 g/dL (ref 13.0–17.0)
Lymphocytes Relative: 24.1 % (ref 12.0–46.0)
Lymphs Abs: 1.5 10*3/uL (ref 0.7–4.0)
MCHC: 33.7 g/dL (ref 30.0–36.0)
MCV: 91 fl (ref 78.0–100.0)
Monocytes Absolute: 0.5 10*3/uL (ref 0.1–1.0)
Monocytes Relative: 9 % (ref 3.0–12.0)
Neutro Abs: 3.9 10*3/uL (ref 1.4–7.7)
Neutrophils Relative %: 64.3 % (ref 43.0–77.0)
Platelets: 196 10*3/uL (ref 150.0–400.0)
RBC: 4.83 Mil/uL (ref 4.22–5.81)
RDW: 13.3 % (ref 11.5–15.5)
WBC: 6 10*3/uL (ref 4.0–10.5)

## 2021-12-21 LAB — URINALYSIS, ROUTINE W REFLEX MICROSCOPIC
Bilirubin Urine: NEGATIVE
Hgb urine dipstick: NEGATIVE
Ketones, ur: NEGATIVE
Leukocytes,Ua: NEGATIVE
Nitrite: NEGATIVE
RBC / HPF: NONE SEEN (ref 0–?)
Specific Gravity, Urine: 1.02 (ref 1.000–1.030)
Total Protein, Urine: NEGATIVE
Urine Glucose: NEGATIVE
Urobilinogen, UA: 0.2 (ref 0.0–1.0)
pH: 6.5 (ref 5.0–8.0)

## 2021-12-21 LAB — VITAMIN B12: Vitamin B-12: 300 pg/mL (ref 211–911)

## 2021-12-21 LAB — HEPATIC FUNCTION PANEL
ALT: 32 U/L (ref 0–53)
AST: 30 U/L (ref 0–37)
Albumin: 4.5 g/dL (ref 3.5–5.2)
Alkaline Phosphatase: 54 U/L (ref 39–117)
Bilirubin, Direct: 0.2 mg/dL (ref 0.0–0.3)
Total Bilirubin: 0.8 mg/dL (ref 0.2–1.2)
Total Protein: 6.7 g/dL (ref 6.0–8.3)

## 2021-12-21 LAB — LIPID PANEL
Cholesterol: 135 mg/dL (ref 0–200)
HDL: 64.3 mg/dL (ref >39.00)
LDL Cholesterol: 59 mg/dL (ref 0–99)
NonHDL: 70.41
Total CHOL/HDL Ratio: 2
Triglycerides: 59 mg/dL (ref 0.0–149.0)
VLDL: 11.8 mg/dL (ref 0.0–40.0)

## 2021-12-21 LAB — BASIC METABOLIC PANEL
BUN: 22 mg/dL (ref 6–23)
CO2: 28 mEq/L (ref 19–32)
Calcium: 9.7 mg/dL (ref 8.4–10.5)
Chloride: 106 mEq/L (ref 96–112)
Creatinine, Ser: 1.08 mg/dL (ref 0.40–1.50)
GFR: 69.22 mL/min (ref >60.00)
Glucose, Bld: 93 mg/dL (ref 70–99)
Potassium: 4.4 mEq/L (ref 3.5–5.1)
Sodium: 141 mEq/L (ref 135–145)

## 2021-12-21 LAB — VITAMIN D 25 HYDROXY (VIT D DEFICIENCY, FRACTURES): VITD: 56.13 ng/mL (ref 30.00–100.00)

## 2021-12-21 LAB — PSA: PSA: 0.41 ng/mL (ref 0.10–4.00)

## 2021-12-21 LAB — TSH: TSH: 2.18 u[IU]/mL (ref 0.35–5.50)

## 2021-12-21 LAB — HEMOGLOBIN A1C: Hgb A1c MFr Bld: 5.5 % (ref 4.6–6.5)

## 2021-12-21 NOTE — Assessment & Plan Note (Signed)
Lab Results  Component Value Date   LDLCALC 77 12/20/2020   Mild uncontrolld, pt to continue current statin liptior 10 as declines change and f/u lipid panel, low chol diet

## 2021-12-21 NOTE — Progress Notes (Signed)
Patient ID: Todd Barrett., male   DOB: 02-21-1951, 71 y.o.   MRN: [id number]         Chief Complaint:: yearly exam       HPI:  Todd Barrett. is a 71 y.o. male overall doing ok; Pt denies chest pain, increased sob or doe, wheezing, orthopnea, PND, increased LE swelling, palpitations, dizziness or syncope.   Pt denies polydipsia, polyuria, or new focal neuro s/s.   Pt denies fever, wt loss, night sweats, loss of appetite, or other constitutional symptoms   s/p GB pancreatitis 2022 now resolved s/p ccx.  Denies worsening reflux, abd pain, dysphagia, n/v, bowel change or blood.  Denies urinary symptoms such as dysuria, frequency, urgency, flank pain, hematuria or n/v, fever, chills.   Wt Readings from Last 3 Encounters:  12/21/21 217 lb 3.2 oz (98.5 kg)  04/02/21 210 lb (95.3 kg)  01/30/21 222 lb 12.8 oz (101.1 kg)   BP Readings from Last 3 Encounters:  12/21/21 128/68  01/30/21 122/76  01/12/21 122/82   Immunization History  Administered Date(s) Administered   Fluad Quad(high Dose 65+) 12/21/2019   Influenza Split 10/05/2012   Influenza Whole 08/05/2011   Influenza, High Dose Seasonal PF 12/16/2017, 07/31/2018   Influenza,inj,Quad PF,6+ Mos 11/24/2013, 11/29/2014, 12/01/2015, 12/04/2016   Influenza,inj,quad, With Preservative 11/26/2018   Influenza-Unspecified 12/20/2020, 10/10/2021   PFIZER Comirnaty(Gray Top)Covid-19 Tri-Sucrose Vaccine 01/17/2020, 02/14/2020, 08/07/2020   Pneumococcal Conjugate-13 12/15/2015   Pneumococcal Polysaccharide-23 12/04/2016   Td 12/27/2007   Tdap 12/17/2018   There are no preventive care reminders to display for this patient.     Past Medical History:  Diagnosis Date   ALLERGIC RHINITIS 01/08/2010   Allergy    dust mites, pet dander   ANEMIA-NOS 01/08/2010   Atrial fibrillation with RVR ([place]) 01/19/2018   CERVICAL RADICULOPATHY, RIGHT 01/08/2010   CHEST PAIN 01/08/2010   DIVERTICULOSIS, COLON 01/08/2010   Dysrhythmia    post op    during PT a fib RVR   GERD (gastroesophageal reflux disease)    Zantac OTC  prn   GOUT 01/08/2010   in past/ when has surgery   HYPERLIPIDEMIA 01/08/2010   Lumbar disc disease 08/05/2011   Osteoarthritis of right knee    end stage   OSTEOARTHRITIS, KNEES, 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   Past Surgical History:  Procedure Laterality Date   ANTERIOR CERVICAL DECOMP/DISCECTOMY FUSION  07/23/2012   Procedure: ANTERIOR CERVICAL DECOMPRESSION/DISCECTOMY FUSION 2 LEVELS;  Surgeon: Otilio Connors, MD;  Location: [facility];  Service: Neurosurgery;  Laterality: N/A;  Cervical three-four, four-five, redo Cervical six-seven anterior cervical decompression, discectomy fusion with allograft and plating   BACK SURGERY  03/2004, 02/2005   CERVICAL SPINE SURGERY  05/2010   CHOLECYSTECTOMY N/A 01/11/2021   Procedure: LAPAROSCOPIC CHOLECYSTECTOMY;  Surgeon: Ralene Ok, MD;  Location: [facility];  Service: General;  Laterality: N/A;   clavicle facture  nov. 2009   100% displacement   COLONOSCOPY     ERCP N/A 01/10/2021   Procedure: ENDOSCOPIC RETROGRADE CHOLANGIOPANCREATOGRAPHY (ERCP);  Surgeon: Ladene Artist, MD;  Location: [facility] ENDOSCOPY;  Service: Endoscopy;  Laterality: N/A;   HERNIA REPAIR     inguinal hernia/right side/ 71 years old   JOINT REPLACEMENT     Left knee 2006   KNEE ARTHROSCOPY     total of 5   left knee arthroscopy  september 2005   lumbar disc surgury  02/2002   lumbar disc surgury  june 2004   NASAL RECONSTRUCTION  x 3   REMOVAL OF STONES  01/10/2021   Procedure: REMOVAL OF STONES;  Surgeon: Ladene Artist, MD;  Location: [place];  Service: Endoscopy;;   REVERSE SHOULDER ARTHROPLASTY Left 07/30/2018   Procedure: LEFT REVERSE SHOULDER ARTHROPLASTY;  Surgeon: Justice Britain, MD;  Location: [place];  Service: Orthopedics;  Laterality: Left;  141min   s/p ENT surgury     x 3   s/p left shoulder surgury  feb. 2009   s/p left TKR  april 2006   SPHINCTEROTOMY  01/10/2021   Procedure:  SPHINCTEROTOMY;  Surgeon: Ladene Artist, MD;  Location: [facility] ENDOSCOPY;  Service: Endoscopy;;   TOTAL KNEE ARTHROPLASTY Right 01/16/2018   Procedure: RIGHT TOTAL KNEE ARTHROPLASTY;  Surgeon: Netta Cedars, MD;  Location: [place];  Service: Orthopedics;  Laterality: Right;   TOTAL SHOULDER ARTHROPLASTY Right 09/16/2019   Procedure: Right Reverse shoulder arthroplasty;  Surgeon: Justice Britain, MD;  Location: [facility];  Service: Orthopedics;  Laterality: Right;    reports that he has never smoked. He has never used smokeless tobacco. He reports that he does not currently use alcohol after a past usage of about 12.0 standard drinks per week. He reports current drug use. Drug: Marijuana. family history includes Alzheimer's disease in his mother; Bradycardia (age of onset: 76) in his father; Lymphoma in his mother; Pancreatic cancer in his brother. Allergies  Allergen Reactions   Dust Mite Extract Other (See Comments)    Nasal Congestion   Drug Ingredient [Bee Pollen] Other (See Comments)    Congestion   Other Other (See Comments)    Pet Dander- Runny nose   Current Outpatient Medications on File Prior to Visit  Medication Sig Dispense Refill   amoxicillin (AMOXIL) 500 MG capsule amoxicillin 500 mg capsule  TAKE 4 CAPSULES 1HR PRIOR     apixaban (ELIQUIS) 5 MG TABS tablet Take 1 tablet (5 mg total) by mouth 2 (two) times daily. 28 tablet 0   atorvastatin (LIPITOR) 10 MG tablet Take 1 tablet (10 mg total) by mouth daily. 90 tablet 3   Calcium Polycarbophil (FIBER-CAPS PO) Take 2 tablets by mouth daily.     fexofenadine (ALLEGRA) 180 MG tablet Take 180 mg by mouth in the morning and at bedtime.     fluticasone (FLONASE) 50 MCG/ACT nasal spray Place 2 sprays into the nose daily as needed for allergies. For allergies.     lisinopril (ZESTRIL) 10 MG tablet Take 1 tablet (10 mg total) by mouth daily. 90 tablet 3   naproxen sodium (ALEVE) 220 MG tablet Take 220 mg by mouth.     Omega-3 Fatty Acids (FISH  OIL) 1200 MG CPDR Take 3,600 mg by mouth daily.     VITAMIN D PO Take 2 tablets by mouth daily.     acetaminophen (TYLENOL) 500 MG tablet Take 2 tablets (1,000 mg total) by mouth every 6 (six) hours as needed. (Patient not taking: Reported on 12/21/2021) 30 tablet 0   HYDROcodone-acetaminophen (NORCO/VICODIN) 5-325 MG tablet Take 1 tablet by mouth every 4 (four) hours as needed. (Patient not taking: Reported on 12/21/2021)     No current facility-administered medications on file prior to visit.        ROS:  All others reviewed and negative.  Objective        PE:  BP 128/68 (BP Location: Right Arm, Patient Position: Sitting, Cuff Size: Large)    Pulse (!) 59    Temp 97.9 F (36.6 C) (Oral)    Ht  5\' 10"  (1.778 m)    Wt 217 lb 3.2 oz (98.5 kg)    SpO2 98%    BMI 31.16 kg/m                 Constitutional: Pt appears in NAD               HENT: Head: NCAT.                Right Ear: External ear normal.                 Left Ear: External ear normal.                Eyes: . Pupils are equal, round, and reactive to light. Conjunctivae and EOM are normal               Nose: without d/c or deformity               Neck: Neck supple. Gross normal ROM               Cardiovascular: Normal rate and regular rhythm.                 Pulmonary/Chest: Effort normal and breath sounds without rales or wheezing.                Abd:  Soft, NT, ND, + BS, no organomegaly               Neurological: Pt is alert. At baseline orientation, motor grossly intact               Skin: Skin is warm. No rashes, no other new lesions, LE edema - none               Psychiatric: Pt behavior is normal without agitation   Micro: none  Cardiac tracings I have personally interpreted today:  none  Pertinent Radiological findings (summarize): none   Lab Results  Component Value Date   WBC 6.0 12/21/2021   HGB 14.8 12/21/2021   HCT 44.0 12/21/2021   PLT 196.0 12/21/2021   GLUCOSE 93 12/21/2021   CHOL 135 12/21/2021   TRIG  59.0 12/21/2021   HDL 64.30 12/21/2021   LDLDIRECT 142.0 09/29/2012   LDLCALC 59 12/21/2021   ALT 32 12/21/2021   AST 30 12/21/2021   NA 141 12/21/2021   K 4.4 12/21/2021   CL 106 12/21/2021   CREATININE 1.08 12/21/2021   BUN 22 12/21/2021   CO2 28 12/21/2021   TSH 2.18 12/21/2021   PSA 0.41 12/21/2021   INR 1.2 01/10/2021   HGBA1C 5.5 12/21/2021   MICROALBUR 1.0 11/27/2016   Assessment/Plan:  Todd Barrett. is a 71 y.o. White or Caucasian [1] male with  has a past medical history of ALLERGIC RHINITIS (01/08/2010), Allergy, ANEMIA-NOS (01/08/2010), Atrial fibrillation with RVR ([place]) (01/19/2018), CERVICAL RADICULOPATHY, RIGHT (01/08/2010), CHEST PAIN (01/08/2010), DIVERTICULOSIS, COLON (01/08/2010), Dysrhythmia, GERD (gastroesophageal reflux disease), GOUT (01/08/2010), HYPERLIPIDEMIA (01/08/2010), Lumbar disc disease (08/05/2011), Osteoarthritis of right knee, and OSTEOARTHRITIS, KNEES, BILATERAL (01/08/2010).  Vitamin D deficiency Last vitamin D Lab Results  Component Value Date   VD25OH 48.29 12/20/2020   Stable, cont oral replacement   Hyperlipemia Lab Results  Component Value Date   LDLCALC 77 12/20/2020   Mild uncontrolld, pt to continue current statin liptior 10 as declines change and f/u lipid panel, low chol diet   Hyperglycemia Lab Results  Component Value Date   HGBA1C  5.5 12/21/2021   Stable, pt to continue current medical treatment  - diet   Hypertension BP Readings from Last 3 Encounters:  12/21/21 128/68  01/30/21 122/76  01/12/21 122/82   Stable, pt to continue medical treatment lisinopril  Followup: Return in about 1 year (around 12/21/2022).  Cathlean Cower, MD 12/22/2021 5:37 AM [facility]

## 2021-12-21 NOTE — Assessment & Plan Note (Signed)
Last vitamin D Lab Results  Component Value Date   VD25OH 48.29 12/20/2020   Stable, cont oral replacement 

## 2021-12-21 NOTE — Patient Instructions (Signed)

## 2021-12-22 ENCOUNTER — Encounter: Payer: Self-pay | Admitting: Internal Medicine

## 2021-12-22 NOTE — Assessment & Plan Note (Signed)
Lab Results  Component Value Date   HGBA1C 5.5 12/21/2021   Stable, pt to continue current medical treatment  - diet

## 2021-12-22 NOTE — Assessment & Plan Note (Signed)
BP Readings from Last 3 Encounters:  12/21/21 128/68  01/30/21 122/76  01/12/21 122/82   Stable, pt to continue medical treatment lisinopril

## 2021-12-27 DIAGNOSIS — M25552 Pain in left hip: Secondary | ICD-10-CM | POA: Insufficient documentation

## 2021-12-31 ENCOUNTER — Other Ambulatory Visit (HOSPITAL_COMMUNITY): Payer: Self-pay | Admitting: Physician Assistant

## 2021-12-31 DIAGNOSIS — Z7901 Long term (current) use of anticoagulants: Secondary | ICD-10-CM | POA: Insufficient documentation

## 2022-01-02 DIAGNOSIS — M25552 Pain in left hip: Secondary | ICD-10-CM | POA: Diagnosis not present

## 2022-01-14 ENCOUNTER — Telehealth: Payer: Self-pay

## 2022-01-14 ENCOUNTER — Ambulatory Visit: Payer: Medicare Other

## 2022-01-14 NOTE — Telephone Encounter (Signed)
Called patient for [place] 0900am  No answer x 3 , Patient may reschedule for next available visit .   L.Demone Lyles,LPN

## 2022-02-08 ENCOUNTER — Other Ambulatory Visit: Payer: Self-pay | Admitting: Internal Medicine

## 2022-02-08 DIAGNOSIS — I10 Essential (primary) hypertension: Secondary | ICD-10-CM

## 2022-02-08 DIAGNOSIS — I480 Paroxysmal atrial fibrillation: Secondary | ICD-10-CM

## 2022-02-08 NOTE — Telephone Encounter (Signed)
Please refill as per office routine med refill policy (all routine meds to be refilled for 3 mo or monthly (per pt preference) up to one year from last visit, then month to month grace period for 3 mo, then further med refills will have to be denied) ? ?

## 2022-02-10 ENCOUNTER — Other Ambulatory Visit: Payer: Self-pay | Admitting: Internal Medicine

## 2022-02-10 NOTE — Telephone Encounter (Signed)
Please refill as per office routine med refill policy (all routine meds to be refilled for 3 mo or monthly (per pt preference) up to one year from last visit, then month to month grace period for 3 mo, then further med refills will have to be denied) ? ?

## 2022-02-12 DIAGNOSIS — H2513 Age-related nuclear cataract, bilateral: Secondary | ICD-10-CM | POA: Diagnosis not present

## 2022-06-24 ENCOUNTER — Ambulatory Visit (INDEPENDENT_AMBULATORY_CARE_PROVIDER_SITE_OTHER): Payer: Medicare Other

## 2022-06-24 DIAGNOSIS — Z0000 Encounter for general adult medical examination without abnormal findings: Secondary | ICD-10-CM | POA: Diagnosis not present

## 2022-06-24 NOTE — Patient Instructions (Signed)
Todd Barrett , Thank you for taking time to come for your Medicare Wellness Visit. I appreciate your ongoing commitment to your health goals. Please review the following plan we discussed and let me know if I can assist you in the future.   Screening recommendations/referrals: Colonoscopy: 03/04/2017 Recommended yearly ophthalmology/optometry visit for glaucoma screening and checkup Recommended yearly dental visit for hygiene and checkup  Vaccinations: Influenza vaccine: completed  Pneumococcal vaccine: completed  Tdap vaccine: 12/17/2018 Shingles vaccine: will consider     Advanced directives: yes   Conditions/risks identified: none  Next appointment: none   Preventive Care 65 Years and Older, Male Preventive care refers to lifestyle choices and visits with your health care provider that can promote health and wellness. What does preventive care include? A yearly physical exam. This is also called an annual well check. Dental exams once or twice a year. Routine eye exams. Ask your health care provider how often you should have your eyes checked. Personal lifestyle choices, including: Daily care of your teeth and gums. Regular physical activity. Eating a healthy diet. Avoiding tobacco and drug use. Limiting alcohol use. Practicing safe sex. Taking low doses of aspirin every day. Taking vitamin and mineral supplements as recommended by your health care provider. What happens during an annual well check? The services and screenings done by your health care provider during your annual well check will depend on your age, overall health, lifestyle risk factors, and family history of disease. Counseling  Your health care provider may ask you questions about your: Alcohol use. Tobacco use. Drug use. Emotional well-being. Home and relationship well-being. Sexual activity. Eating habits. History of falls. Memory and ability to understand (cognition). Work and work  Astronomer. Screening  You may have the following tests or measurements: Height, weight, and BMI. Blood pressure. Lipid and cholesterol levels. These may be checked every 5 years, or more frequently if you are over 26 years old. Skin check. Lung cancer screening. You may have this screening every year starting at age 32 if you have a 30-pack-year history of smoking and currently smoke or have quit within the past 15 years. Fecal occult blood test (FOBT) of the stool. You may have this test every year starting at age 1. Flexible sigmoidoscopy or colonoscopy. You may have a sigmoidoscopy every 5 years or a colonoscopy every 10 years starting at age 53. Prostate cancer screening. Recommendations will vary depending on your family history and other risks. Hepatitis C blood test. Hepatitis B blood test. Sexually transmitted disease (STD) testing. Diabetes screening. This is done by checking your blood sugar (glucose) after you have not eaten for a while (fasting). You may have this done every 1-3 years. Abdominal aortic aneurysm (AAA) screening. You may need this if you are a current or former smoker. Osteoporosis. You may be screened starting at age 73 if you are at high risk. Talk with your health care provider about your test results, treatment options, and if necessary, the need for more tests. Vaccines  Your health care provider may recommend certain vaccines, such as: Influenza vaccine. This is recommended every year. Tetanus, diphtheria, and acellular pertussis (Tdap, Td) vaccine. You may need a Td booster every 10 years. Zoster vaccine. You may need this after age 47. Pneumococcal 13-valent conjugate (PCV13) vaccine. One dose is recommended after age 9. Pneumococcal polysaccharide (PPSV23) vaccine. One dose is recommended after age 46. Talk to your health care provider about which screenings and vaccines you need and how often you need  them. This information is not intended to replace  advice given to you by your health care provider. Make sure you discuss any questions you have with your health care provider. Document Released: 12/08/2015 Document Revised: 07/31/2016 Document Reviewed: 09/12/2015 Elsevier Interactive Patient Education  2017 [place] Prevention in the Home Falls can cause injuries. They can happen to people of all ages. There are many things you can do to make your home safe and to help prevent falls. What can I do on the outside of my home? Regularly fix the edges of walkways and driveways and fix any cracks. Remove anything that might make you trip as you walk through a door, such as a raised step or threshold. Trim any bushes or trees on the path to your home. Use bright outdoor lighting. Clear any walking paths of anything that might make someone trip, such as rocks or tools. Regularly check to see if handrails are loose or broken. Make sure that both sides of any steps have handrails. Any raised decks and porches should have guardrails on the edges. Have any leaves, snow, or ice cleared regularly. Use sand or salt on walking paths during winter. Clean up any spills in your garage right away. This includes oil or grease spills. What can I do in the bathroom? Use night lights. Install grab bars by the toilet and in the tub and shower. Do not use towel bars as grab bars. Use non-skid mats or decals in the tub or shower. If you need to sit down in the shower, use a plastic, non-slip stool. Keep the floor dry. Clean up any water that spills on the floor as soon as it happens. Remove soap buildup in the tub or shower regularly. Attach bath mats securely with double-sided non-slip rug tape. Do not have throw rugs and other things on the floor that can make you trip. What can I do in the bedroom? Use night lights. Make sure that you have a light by your bed that is easy to reach. Do not use any sheets or blankets that are too big for your bed.  They should not hang down onto the floor. Have a firm chair that has side arms. You can use this for support while you get dressed. Do not have throw rugs and other things on the floor that can make you trip. What can I do in the kitchen? Clean up any spills right away. Avoid walking on wet floors. Keep items that you use a lot in easy-to-reach places. If you need to reach something above you, use a strong step stool that has a grab bar. Keep electrical cords out of the way. Do not use floor polish or wax that makes floors slippery. If you must use wax, use non-skid floor wax. Do not have throw rugs and other things on the floor that can make you trip. What can I do with my stairs? Do not leave any items on the stairs. Make sure that there are handrails on both sides of the stairs and use them. Fix handrails that are broken or loose. Make sure that handrails are as long as the stairways. Check any carpeting to make sure that it is firmly attached to the stairs. Fix any carpet that is loose or worn. Avoid having throw rugs at the top or bottom of the stairs. If you do have throw rugs, attach them to the floor with carpet tape. Make sure that you have a light switch at  the top of the stairs and the bottom of the stairs. If you do not have them, ask someone to add them for you. What else can I do to help prevent falls? Wear shoes that: Do not have high heels. Have rubber bottoms. Are comfortable and fit you well. Are closed at the toe. Do not wear sandals. If you use a stepladder: Make sure that it is fully opened. Do not climb a closed stepladder. Make sure that both sides of the stepladder are locked into place. Ask someone to hold it for you, if possible. Clearly mark and make sure that you can see: Any grab bars or handrails. First and last steps. Where the edge of each step is. Use tools that help you move around (mobility aids) if they are needed. These  include: Canes. Walkers. Scooters. Crutches. Turn on the lights when you go into a dark area. Replace any light bulbs as soon as they burn out. Set up your furniture so you have a clear path. Avoid moving your furniture around. If any of your floors are uneven, fix them. If there are any pets around you, be aware of where they are. Review your medicines with your doctor. Some medicines can make you feel dizzy. This can increase your chance of falling. Ask your doctor what other things that you can do to help prevent falls. This information is not intended to replace advice given to you by your health care provider. Make sure you discuss any questions you have with your health care provider. Document Released: 09/07/2009 Document Revised: 04/18/2016 Document Reviewed: 12/16/2014 Elsevier Interactive Patient Education  2017 [employer].

## 2022-06-24 NOTE — Progress Notes (Signed)
Subjective:   Todd Barrett. is a 71 y.o. male who presents for an Subsequent Medicare Annual Wellness Visit.   I connected with Amie Spawn  today by telephone and verified that I am speaking with the correct person using two identifiers. Location patient: home Location provider: work Persons participating in the virtual visit: patient, provider.   I discussed the limitations, risks, security and privacy concerns of performing an evaluation and management service by telephone and the availability of in person appointments. I also discussed with the patient that there may be a patient responsible charge related to this service. The patient expressed understanding and verbally consented to this telephonic visit.    Interactive audio and video telecommunications were attempted between this provider and patient, however failed, due to patient having technical difficulties OR patient did not have access to video capability.  We continued and completed visit with audio only.    Review of Systems     Cardiac Risk Factors include: advanced age (>77men, >71 women);male gender;dyslipidemia     Objective:    Today's Vitals   There is no height or weight on file to calculate BMI.     06/24/2022    8:22 AM 04/02/2021    8:49 PM 01/10/2021   10:44 AM 01/06/2021   11:49 PM 12/25/2020    3:52 PM 12/21/2020   10:01 AM 09/16/2019    5:49 AM  Advanced Directives  Does Patient Have a Medical Advance Directive? Yes Yes Yes No No No Yes  Type of Paramedic of [place];Living will [place];Living will     [place];Living will  Does patient want to make changes to medical advance directive?       No - Patient declined  Copy of [place] in Chart? No - copy requested No - copy requested     No - copy requested  Would patient like information on creating a medical advance directive?    No - Patient declined No -  Patient declined No - Patient declined     Current Medications (verified) Outpatient Encounter Medications as of 06/24/2022  Medication Sig   acetaminophen (TYLENOL) 500 MG tablet Take 2 tablets (1,000 mg total) by mouth every 6 (six) hours as needed.   amoxicillin (AMOXIL) 500 MG capsule amoxicillin 500 mg capsule  TAKE 4 CAPSULES 1HR PRIOR   apixaban (ELIQUIS) 5 MG TABS tablet TAKE 1 TABLET BY MOUTH TWICE A DAY   atorvastatin (LIPITOR) 10 MG tablet TAKE 1 TABLET BY MOUTH EVERY DAY   Calcium Polycarbophil (FIBER-CAPS PO) Take 2 tablets by mouth daily.   fexofenadine (ALLEGRA) 180 MG tablet Take 180 mg by mouth in the morning and at bedtime.   fluticasone (FLONASE) 50 MCG/ACT nasal spray Place 2 sprays into the nose daily as needed for allergies. For allergies.   lisinopril (ZESTRIL) 10 MG tablet TAKE 1 TABLET BY MOUTH EVERY DAY   naproxen sodium (ALEVE) 220 MG tablet Take 220 mg by mouth.   Omega-3 Fatty Acids (FISH OIL) 1200 MG CPDR Take 3,600 mg by mouth daily.   VITAMIN D PO Take 2 tablets by mouth daily.   HYDROcodone-acetaminophen (NORCO/VICODIN) 5-325 MG tablet Take 1 tablet by mouth every 4 (four) hours as needed. (Patient not taking: Reported on 12/21/2021)   No facility-administered encounter medications on file as of 06/24/2022.    Allergies (verified) Dust mite extract, Drug ingredient [bee pollen], and Other   History: Past  Medical History:  Diagnosis Date   ALLERGIC RHINITIS 01/08/2010   Allergy    dust mites, pet dander   ANEMIA-NOS 01/08/2010   Atrial fibrillation with RVR ([place]) 01/19/2018   CERVICAL RADICULOPATHY, RIGHT 01/08/2010   CHEST PAIN 01/08/2010   DIVERTICULOSIS, COLON 01/08/2010   Dysrhythmia    post op   during PT a fib RVR   GERD (gastroesophageal reflux disease)    Zantac OTC  prn   GOUT 01/08/2010   in past/ when has surgery   HYPERLIPIDEMIA 01/08/2010   Lumbar disc disease 08/05/2011   Osteoarthritis of right knee    end stage   OSTEOARTHRITIS,  KNEES, 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   Past Surgical History:  Procedure Laterality Date   ANTERIOR CERVICAL DECOMP/DISCECTOMY FUSION  07/23/2012   Procedure: ANTERIOR CERVICAL DECOMPRESSION/DISCECTOMY FUSION 2 LEVELS;  Surgeon: Otilio Connors, MD;  Location: [place] [facility];  Service: Neurosurgery;  Laterality: N/A;  Cervical three-four, four-five, redo Cervical six-seven anterior cervical decompression, discectomy fusion with allograft and plating   BACK SURGERY  03/2004, 02/2005   CERVICAL SPINE SURGERY  05/2010   CHOLECYSTECTOMY N/A 01/11/2021   Procedure: LAPAROSCOPIC CHOLECYSTECTOMY;  Surgeon: Ralene Ok, MD;  Location: [place];  Service: General;  Laterality: N/A;   clavicle facture  nov. 2009   100% displacement   COLONOSCOPY     ERCP N/A 01/10/2021   Procedure: ENDOSCOPIC RETROGRADE CHOLANGIOPANCREATOGRAPHY (ERCP);  Surgeon: Ladene Artist, MD;  Location: [facility] ENDOSCOPY;  Service: Endoscopy;  Laterality: N/A;   HERNIA REPAIR     inguinal hernia/right side/ 71 years old   JOINT REPLACEMENT     Left knee 2006   KNEE ARTHROSCOPY     total of 5   left knee arthroscopy  september 2005   lumbar disc surgury  02/2002   lumbar disc surgury  june 2004   NASAL RECONSTRUCTION     x 3   REMOVAL OF STONES  01/10/2021   Procedure: REMOVAL OF STONES;  Surgeon: Ladene Artist, MD;  Location: [place];  Service: Endoscopy;;   REVERSE SHOULDER ARTHROPLASTY Left 07/30/2018   Procedure: LEFT REVERSE SHOULDER ARTHROPLASTY;  Surgeon: Justice Britain, MD;  Location: [place];  Service: Orthopedics;  Laterality: Left;  185min   s/p ENT surgury     x 3   s/p left shoulder surgury  feb. 2009   s/p left TKR  april 2006   SPHINCTEROTOMY  01/10/2021   Procedure: SPHINCTEROTOMY;  Surgeon: Ladene Artist, MD;  Location[facility] END[facility];  Service: Endoscopy;;   TOTAL KNEE ARTHROPLASTY Right 01/16/2018   Procedure: RIGHT TOTAL KNEE ARTHROPLASTY;  Surgeon: Netta Cedars, MD;  Location: [place];  Service: Orthopedics;   Laterality: Right;   TOTAL SHOULDER ARTHROPLASTY Right 09/16/2019   Procedure: Right Reverse shoulder arthroplasty;  Surgeon: Justice Britain, MD;  Location: [facility];  Service: Orthopedics;  Laterality: Right;   Family History  Problem Relation Age of Onset   Bradycardia Father 65       Pacemaker   Lymphoma Mother    Alzheimer's disease Mother    Pancreatic cancer Brother    Colon cancer Neg Hx    Social History   Socioeconomic History   Marital status: Married    Spouse name: Not on file   Number of children: Not on file   Years of education: Not on file   Highest education level: Not on file  Occupational History   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 [place]    Employer: [employer]  Tobacco Use   Smoking status: Never   Smokeless tobacco: Never  Vaping Use   Vaping Use: Never used  Substance and Sexual Activity   Alcohol use: Not Currently    Alcohol/week: 12.0 standard drinks of alcohol    Types: 12 Cans of beer per week    Comment: 3-4 days a week   Drug use: Yes    Types: Marijuana    Comment: daily   Sexual activity: Yes  Other Topics Concern   Not on file  Social History Narrative   Not on file   Social Determinants of Health   Financial Resource Strain: Low Risk  (06/24/2022)   Overall Financial Resource Strain (CARDIA)    Difficulty of Paying Living Expenses: Not hard at all  Food Insecurity: No Food Insecurity (06/24/2022)   Hunger Vital Sign    Worried About Running Out of Food in the Last Year: Never true    Ran Out of Food in the Last Year: Never true  Transportation Needs: No Transportation Needs (06/24/2022)   PRAPARE - Administrator, Civil Service (Medical): No    Lack of Transportation (Non-Medical): No  Physical Activity: Sufficiently Active (06/24/2022)   Exercise Vital Sign    Days of Exercise per Week: 3 days    Minutes of Exercise per Session: 60 min  Stress: No Stress Concern Present (06/24/2022)   [employer] of Occupational  Health - Occupational Stress Questionnaire    Feeling of Stress : Not at all  Social Connections: Moderately Isolated (06/24/2022)   Social Connection and Isolation Panel [NHANES]    Frequency of Communication with Friends and Family: Three times a week    Frequency of Social Gatherings with Friends and Family: Three times a week    Attends Religious Services: Never    Active Member of Clubs or Organizations: No    Attends Engineer, structural: Never    Marital Status: Married    Tobacco Counseling Counseling given: Not Answered   Clinical Intake:  Pre-visit preparation completed: Yes  Pain : No/denies pain     Nutritional Risks: None Diabetes: No  How often do you need to have someone help you when you read instructions, pamphlets, or other written materials from your doctor or pharmacy?: 1 - Never What is the last grade level you completed in school?: college  Diabetic?no   Interpreter Needed?: No  Information entered by :: [email or web address],lpn   Activities of Daily Living    06/24/2022    8:29 AM  In your present state of health, do you have any difficulty performing the following activities:  Hearing? 0  Vision? 0  Difficulty concentrating or making decisions? 0  Walking or climbing stairs? 0  Dressing or bathing? 0  Doing errands, shopping? 0  Preparing Food and eating ? N  Using the Toilet? N  In the past six months, have you accidently leaked urine? N  Do you have problems with loss of bowel control? N  Managing your Medications? N  Managing your Finances? N  Housekeeping or managing your Housekeeping? N    Patient Care Team: Corwin Levins, MD as PCP - General Eldridge Dace Donnie Coffin, MD as PCP - Cardiology (Cardiology) Sinda Du, MD as Consulting Physician (Ophthalmology)  Indicate any recent Medical Services you may have received from other than [facility] providers in the past year (date may be approximate).     Assessment:   This is a routine  wellness examination for [place].  Hearing/Vision screen Vision Screening - Comments:: Annual eye exam wear glasses   Dietary issues and exercise activities discussed: Current Exercise Habits: Home exercise routine, Type of exercise: walking, Time (Minutes): 60, Frequency (Times/Week): 3, Weekly Exercise (Minutes/Week): 180, Intensity: Mild, Exercise limited by: None identified   Goals Addressed   None    Depression Screen    06/24/2022    8:23 AM 06/24/2022    8:21 AM 12/21/2021    9:37 AM 12/21/2021    9:06 AM 12/25/2020    3:45 PM 12/20/2020    9:36 AM 12/21/2019    9:07 AM  PHQ 2/9 Scores  PHQ - 2 Score 0 0 0 0 0 0 0    Fall Risk    06/24/2022    8:23 AM 12/21/2021    9:37 AM 12/21/2021    9:06 AM 12/25/2020    3:53 PM 12/20/2020    9:36 AM  Fall Risk   Falls in the past year? 1 1 1 1 1   Number falls in past yr: 1 0 1 1 1   Injury with Fall? 0 0 0 0 0  Risk for fall due to : Impaired mobility  Impaired balance/gait Impaired balance/gait;History of fall(s);Orthopedic patient   Risk for fall due to: Comment knee replacements      Follow up Falls evaluation completed;Education provided   Falls evaluation completed     FALL RISK PREVENTION PERTAINING TO THE HOME:  Any stairs in or around the home? Yes  If so, are there any without handrails? No  Home free of loose throw rugs in walkways, pet beds, electrical cords, etc? Yes  Adequate lighting in your home to reduce risk of falls? Yes   ASSISTIVE DEVICES UTILIZED TO PREVENT FALLS:  Life alert? No  Use of a cane, walker or w/c? No  Grab bars in the bathroom? No  Shower chair or bench in shower? Yes  Elevated toilet seat or a handicapped toilet? Yes     Cognitive Function:  Normal cognitive status assessed by telephone conversation  by this Nurse Health Advisor. No abnormalities found.        06/24/2022    8:27 AM  6CIT Screen  What Year? 0 points  What month? 0 points  What time? 0 points  Count back from 20 0  points  Months in reverse 0 points  Repeat phrase 0 points  Total Score 0 points    Immunizations Immunization History  Administered Date(s) Administered   Fluad Quad(high Dose 65+) 12/21/2019   Influenza Split 10/05/2012   Influenza Whole 08/05/2011   Influenza, High Dose Seasonal PF 12/16/2017, 07/31/2018   Influenza,inj,Quad PF,6+ Mos 11/24/2013, 11/29/2014, 12/01/2015, 12/04/2016   Influenza,inj,quad, With Preservative 11/26/2018   Influenza-Unspecified 12/20/2020, 10/10/2021   PFIZER Comirnaty(Gray Top)Covid-19 Tri-Sucrose Vaccine 01/17/2020, 02/14/2020, 08/07/2020   Pneumococcal Conjugate-13 12/15/2015   Pneumococcal Polysaccharide-23 12/04/2016   Td 12/27/2007   Tdap 12/17/2018    TDAP status: Up to date  Flu Vaccine status: Up to date  Pneumococcal vaccine status: Up to date  Covid-19 vaccine status: Completed vaccines  Qualifies for Shingles Vaccine? Yes   Zostavax completed No   Shingrix Completed?: No.    Education has been provided regarding the importance of this vaccine. Patient has been advised to call insurance company to determine out of pocket expense if they have not yet received this vaccine. Advised may also receive vaccine at local pharmacy or Health Dept. Verbalized acceptance and understanding.  Screening Tests Health Maintenance  Topic Date  Due   Zoster Vaccines- Shingrix (1 of 2) Never done   Diabetic kidney evaluation - Urine ACR  11/27/2017   COVID-19 Vaccine (4 - Pfizer series) 10/02/2020   OPHTHALMOLOGY EXAM  02/12/2022   HEMOGLOBIN A1C  06/20/2022   INFLUENZA VACCINE  06/25/2022   Diabetic kidney evaluation - GFR measurement  12/21/2022   FOOT EXAM  12/21/2022   COLONOSCOPY (Pts 45-70yrs Insurance coverage will need to be confirmed)  03/05/2027   TETANUS/TDAP  12/17/2028   Pneumonia Vaccine 99+ Years old  Completed   Hepatitis C Screening  Completed   HPV VACCINES  Aged Out    Health Maintenance  Health Maintenance Due  Topic Date  Due   Zoster Vaccines- Shingrix (1 of 2) Never done   Diabetic kidney evaluation - Urine ACR  11/27/2017   COVID-19 Vaccine (4 - Pfizer series) 10/02/2020   OPHTHALMOLOGY EXAM  02/12/2022   HEMOGLOBIN A1C  06/20/2022    Colorectal cancer screening: Type of screening: Colonoscopy. Completed 03/04/2017. Repeat every 10 years  Lung Cancer Screening: (Low Dose CT Chest recommended if Age 55-80 years, 30 pack-year currently smoking OR have quit w/in 15years.) does not qualify.   Lung Cancer Screening Referral: n/a  Additional Screening:  Hepatitis C Screening: does not qualify;   Vision Screening: Recommended annual ophthalmology exams for early detection of glaucoma and other disorders of the eye. Is the patient up to date with their annual eye exam?  Yes  Who is the provider or what is the name of the office in which the patient attends annual eye exams? [facility]  If pt is not established with a provider, would they like to be referred to a provider to establish care? No .   Dental Screening: Recommended annual dental exams for proper oral hygiene  Community Resource Referral / Chronic Care Management: CRR required this visit?  No   CCM required this visit?  No      Plan:     I have personally reviewed and noted the following in the patient's chart:   Medical and social history Use of alcohol, tobacco or illicit drugs  Current medications and supplements including opioid prescriptions. Patient is not currently taking opioid prescriptions. Functional ability and status Nutritional status Physical activity Advanced directives List of other physicians Hospitalizations, surgeries, and ER visits in previous 12 months Vitals Screenings to include cognitive, depression, and falls Referrals and appointments  In addition, I have reviewed and discussed with patient certain preventive protocols, quality metrics, and best practice recommendations. A written  personalized care plan for preventive services as well as general preventive health recommendations were provided to patient.     Daphane Shepherd, LPN   [id number]   Nurse Notes: none

## 2022-10-14 DIAGNOSIS — Z23 Encounter for immunization: Secondary | ICD-10-CM | POA: Diagnosis not present

## 2022-12-24 ENCOUNTER — Ambulatory Visit (INDEPENDENT_AMBULATORY_CARE_PROVIDER_SITE_OTHER): Payer: Medicare Other | Admitting: Internal Medicine

## 2022-12-24 ENCOUNTER — Encounter: Payer: Self-pay | Admitting: Internal Medicine

## 2022-12-24 VITALS — BP 120/62 | HR 81 | Temp 98.2°F | Ht 70.0 in | Wt 217.0 lb

## 2022-12-24 DIAGNOSIS — R739 Hyperglycemia, unspecified: Secondary | ICD-10-CM | POA: Diagnosis not present

## 2022-12-24 DIAGNOSIS — N320 Bladder-neck obstruction: Secondary | ICD-10-CM

## 2022-12-24 DIAGNOSIS — M24561 Contracture, right knee: Secondary | ICD-10-CM

## 2022-12-24 DIAGNOSIS — E7800 Pure hypercholesterolemia, unspecified: Secondary | ICD-10-CM

## 2022-12-24 DIAGNOSIS — E559 Vitamin D deficiency, unspecified: Secondary | ICD-10-CM | POA: Diagnosis not present

## 2022-12-24 DIAGNOSIS — E538 Deficiency of other specified B group vitamins: Secondary | ICD-10-CM | POA: Diagnosis not present

## 2022-12-24 DIAGNOSIS — M24562 Contracture, left knee: Secondary | ICD-10-CM

## 2022-12-24 DIAGNOSIS — I10 Essential (primary) hypertension: Secondary | ICD-10-CM

## 2022-12-24 DIAGNOSIS — G8929 Other chronic pain: Secondary | ICD-10-CM

## 2022-12-24 DIAGNOSIS — Z125 Encounter for screening for malignant neoplasm of prostate: Secondary | ICD-10-CM

## 2022-12-24 DIAGNOSIS — Z7901 Long term (current) use of anticoagulants: Secondary | ICD-10-CM | POA: Diagnosis not present

## 2022-12-24 DIAGNOSIS — M545 Low back pain, unspecified: Secondary | ICD-10-CM

## 2022-12-24 LAB — CBC WITH DIFFERENTIAL/PLATELET
Basophils Absolute: 0 10*3/uL (ref 0.0–0.1)
Basophils Relative: 0.6 % (ref 0.0–3.0)
Eosinophils Absolute: 0.1 10*3/uL (ref 0.0–0.7)
Eosinophils Relative: 1.4 % (ref 0.0–5.0)
HCT: 46.4 % (ref 39.0–52.0)
Hemoglobin: 15.9 g/dL (ref 13.0–17.0)
Lymphocytes Relative: 22.4 % (ref 12.0–46.0)
Lymphs Abs: 1.6 10*3/uL (ref 0.7–4.0)
MCHC: 34.3 g/dL (ref 30.0–36.0)
MCV: 90.3 fl (ref 78.0–100.0)
Monocytes Absolute: 0.5 10*3/uL (ref 0.1–1.0)
Monocytes Relative: 7.4 % (ref 3.0–12.0)
Neutro Abs: 4.9 10*3/uL (ref 1.4–7.7)
Neutrophils Relative %: 68.2 % (ref 43.0–77.0)
Platelets: 213 10*3/uL (ref 150.0–400.0)
RBC: 5.14 Mil/uL (ref 4.22–5.81)
RDW: 13.8 % (ref 11.5–15.5)
WBC: 7.2 10*3/uL (ref 4.0–10.5)

## 2022-12-24 LAB — VITAMIN B12: Vitamin B-12: 429 pg/mL (ref 211–911)

## 2022-12-24 LAB — URINALYSIS, ROUTINE W REFLEX MICROSCOPIC
Bilirubin Urine: NEGATIVE
Ketones, ur: NEGATIVE
Leukocytes,Ua: NEGATIVE
Nitrite: NEGATIVE
Specific Gravity, Urine: 1.02 (ref 1.000–1.030)
Total Protein, Urine: NEGATIVE
Urine Glucose: NEGATIVE
Urobilinogen, UA: 0.2 (ref 0.0–1.0)
pH: 6 (ref 5.0–8.0)

## 2022-12-24 LAB — MICROALBUMIN / CREATININE URINE RATIO
Creatinine,U: 156.9 mg/dL
Microalb Creat Ratio: 0.7 mg/g (ref 0.0–30.0)
Microalb, Ur: 1 mg/dL (ref 0.0–1.9)

## 2022-12-24 LAB — HEPATIC FUNCTION PANEL
ALT: 28 U/L (ref 0–53)
AST: 28 U/L (ref 0–37)
Albumin: 4.6 g/dL (ref 3.5–5.2)
Alkaline Phosphatase: 54 U/L (ref 39–117)
Bilirubin, Direct: 0.1 mg/dL (ref 0.0–0.3)
Total Bilirubin: 0.7 mg/dL (ref 0.2–1.2)
Total Protein: 6.9 g/dL (ref 6.0–8.3)

## 2022-12-24 LAB — BASIC METABOLIC PANEL
BUN: 22 mg/dL (ref 6–23)
CO2: 24 mEq/L (ref 19–32)
Calcium: 10 mg/dL (ref 8.4–10.5)
Chloride: 105 mEq/L (ref 96–112)
Creatinine, Ser: 1.04 mg/dL (ref 0.40–1.50)
GFR: 71.92 mL/min (ref >60.00)
Glucose, Bld: 101 mg/dL — ABNORMAL HIGH (ref 70–99)
Potassium: 4.6 mEq/L (ref 3.5–5.1)
Sodium: 139 mEq/L (ref 135–145)

## 2022-12-24 LAB — VITAMIN D 25 HYDROXY (VIT D DEFICIENCY, FRACTURES): VITD: 41.55 ng/mL (ref 30.00–100.00)

## 2022-12-24 LAB — PSA: PSA: 0.67 ng/mL (ref 0.10–4.00)

## 2022-12-24 LAB — TSH: TSH: 2.37 u[IU]/mL (ref 0.35–5.50)

## 2022-12-24 LAB — LIPID PANEL
Cholesterol: 149 mg/dL (ref 0–200)
HDL: 67.6 mg/dL (ref >39.00)
LDL Cholesterol: 69 mg/dL (ref 0–99)
NonHDL: 81.66
Total CHOL/HDL Ratio: 2
Triglycerides: 65 mg/dL (ref 0.0–149.0)
VLDL: 13 mg/dL (ref 0.0–40.0)

## 2022-12-24 LAB — HEMOGLOBIN A1C: Hgb A1c MFr Bld: 5.8 % (ref 4.6–6.5)

## 2022-12-24 NOTE — Assessment & Plan Note (Signed)
Lab Results  Component Value Date   LDLCALC 59 12/21/2021   Stable, pt to continue current statin lipitor 10 mg and f/u lab today

## 2022-12-24 NOTE — Assessment & Plan Note (Signed)
Lab Results  Component Value Date   HGBA1C 5.5 12/21/2021   Stable, pt to continue current medical treatment  - diet, wt control

## 2022-12-24 NOTE — Assessment & Plan Note (Signed)
Worsening now with worsening gait difficulty and recent falls - for refer Ortho, also PT eval

## 2022-12-24 NOTE — Patient Instructions (Addendum)
Please continue all other medications as before, and refills have been done if requested.  Please have the pharmacy call with any other refills you may need.  Please continue your efforts at being more active, low cholesterol diet, and weight control.  You are otherwise up to date with prevention measures today.  Please keep your appointments with your specialists as you may have planned  You will be contacted regarding the referral for: Physical Therapy and Orthopedic (at [facility])  Please go to the LAB at the blood drawing area for the tests to be done  You will be contacted by phone if any changes need to be made immediately.  Otherwise, you will receive a letter about your results with an explanation, but please check with MyChart first.  Please remember to sign up for MyChart if you have not done so, as this will be important to you in the future with finding out test results, communicating by private email, and scheduling acute appointments online when needed.  Please make an Appointment to return for your 1 year visit, or sooner if needed

## 2022-12-24 NOTE — Assessment & Plan Note (Signed)
BP Readings from Last 3 Encounters:  12/24/22 120/62  12/21/21 128/68  01/30/21 122/76   Stable, pt to continue medical treatment lisinopril 10 mg qd

## 2022-12-24 NOTE — Assessment & Plan Note (Signed)
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

## 2022-12-24 NOTE — Assessment & Plan Note (Signed)
Last vitamin D Lab Results  Component Value Date   VD25OH 56.13 12/21/2021   Stable, cont oral replacement

## 2022-12-24 NOTE — Assessment & Plan Note (Signed)
To continue eliquis for now, will try to address gait issue and recurrent falls with PT and refer [facility]

## 2022-12-24 NOTE — Progress Notes (Signed)
Patient ID: Todd Pace., male   DOB: [birth year], 72 y.o.   MRN: [id number]         Chief Complaint:: yearly exam Physical (Arthritis pain in hands, lower back concerns)        HPI:  Todd Bernhart. is a 72 y.o. male here with worsening chornic lbp and also marked chronic decreased ROM with both knees due to scarring and previous procedures, overall worsening such that increased ambulation just lead to more right lower back pain, resolved with sitting and rest  Has seen [facility] in past. And now hoping for referral due to several falls recently with walking too fast and center of gravity off kilter,   no worsening injury.  Pt denies chest pain, increased sob or doe, wheezing, orthopnea, PND, increased LE swelling, palpitations, dizziness or syncope.   Pt denies polydipsia, polyuria, or new focal neuro s/s.    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   Wt Readings from Last 3 Encounters:  12/24/22 217 lb (98.4 kg)  12/21/21 217 lb 3.2 oz (98.5 kg)  04/02/21 210 lb (95.3 kg)   BP Readings from Last 3 Encounters:  12/24/22 120/62  12/21/21 128/68  01/30/21 122/76   Immunization History  Administered Date(s) Administered   Fluad Quad(high Dose 65+) 12/21/2019   Influenza Split 10/05/2012   Influenza Whole 08/05/2011   Influenza, High Dose Seasonal PF 12/16/2017, 07/31/2018   Influenza,inj,Quad PF,6+ Mos 11/24/2013, 11/29/2014, 12/01/2015, 12/04/2016   Influenza,inj,quad, With Preservative 11/26/2018   Influenza-Unspecified 12/20/2020, 10/10/2021, 10/21/2022   PFIZER Comirnaty(Gray Top)Covid-19 Tri-Sucrose Vaccine 01/17/2020, 02/14/2020, 08/07/2020   PFIZER(Purple Top)SARS-COV-2 Vaccination 10/21/2022   Pneumococcal Conjugate-13 12/15/2015   Pneumococcal Polysaccharide-23 12/04/2016   Td 12/27/2007   Tdap 12/17/2018   Health Maintenance Due  Topic Date Due    HEMOGLOBIN A1C  06/20/2022   COVID-19 Vaccine (5 - 2023-24 season) 12/16/2022   Diabetic kidney evaluation - eGFR measurement  12/21/2022      Past Medical History:  Diagnosis Date   ALLERGIC RHINITIS 01/08/2010   Allergy    dust mites, pet dander   ANEMIA-NOS 01/08/2010   Atrial fibrillation with RVR (HCC) 01/19/2018   CERVICAL RADICULOPATHY, RIGHT 01/08/2010   CHEST PAIN 01/08/2010   DIVERTICULOSIS, COLON 01/08/2010   Dysrhythmia    post op   during PT a fib RVR   GERD (gastroesophageal reflux disease)    Zantac OTC  prn   GOUT 01/08/2010   in past/ when has surgery   HYPERLIPIDEMIA 01/08/2010   Lumbar disc disease 08/05/2011   Osteoarthritis of right knee    end stage   OSTEOARTHRITIS, KNEES, 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   Past Surgical History:  Procedure Laterality Date   ANTERIOR CERVICAL DECOMP/DISCECTOMY FUSION  07/23/2012   Procedure: ANTERIOR CERVICAL DECOMPRESSION/DISCECTOMY FUSION 2 LEVELS;  Surgeon: Clydene Fake, MD;  Location: [facility];  Service: Neurosurgery;  Laterality: N/A;  Cervical three-four, four-five, redo Cervical six-seven anterior cervical decompression, discectomy fusion with allograft and plating   BACK SURGERY  03/2004, 02/2005   CERVICAL SPINE SURGERY  05/2010   CHOLECYSTECTOMY N/A 01/11/2021   Procedure: LAPAROSCOPIC CHOLECYSTECTOMY;  Surgeon: Axel Filler, MD;  Location: [facility] OR;  Service: General;  Laterality: N/A;   clavicle facture  nov. 2009   100% displacement   COLONOSCOPY     ERCP N/A 01/10/2021   Procedure: ENDOSCOPIC RETROGRADE CHOLANGIOPANCREATOGRAPHY (ERCP);  Surgeon: Ladene Artist, MD;  Location: [facility] ENDOSCOPY;  Service: Endoscopy;  Laterality: N/A;   HERNIA REPAIR     inguinal hernia/right side/ 72 years old   JOINT REPLACEMENT     Left knee 2006   KNEE ARTHROSCOPY     total of 5   left knee arthroscopy  september 2005   lumbar disc surgury  02/2002   lumbar disc surgury  june 2004   NASAL RECONSTRUCTION     x 3   REMOVAL OF STONES   01/10/2021   Procedure: REMOVAL OF STONES;  Surgeon: Ladene Artist, MD;  Location: [place];  Service: Endoscopy;;   REVERSE SHOULDER ARTHROPLASTY Left 07/30/2018   Procedure: LEFT REVERSE SHOULDER ARTHROPLASTY;  Surgeon: Justice Britain, MD;  Location: [place];  Service: Orthopedics;  Laterality: Left;  174min   s/p ENT surgury     x 3   s/p left shoulder surgury  feb. 2009   s/p left TKR  april 2006   SPHINCTEROTOMY  01/10/2021   Procedure: SPHINCTEROTOMY;  Surgeon: Ladene Artist, MD;  Location: [facility] ENDOSCOPY;  Service: Endoscopy;;   TOTAL KNEE ARTHROPLASTY Right 01/16/2018   Procedure: RIGHT TOTAL KNEE ARTHROPLASTY;  Surgeon: Netta Cedars, MD;  Location: [place];  Service: Orthopedics;  Laterality: Right;   TOTAL SHOULDER ARTHROPLASTY Right 09/16/2019   Procedure: Right Reverse shoulder arthroplasty;  Surgeon: Justice Britain, MD;  Location: [facility];  Service: Orthopedics;  Laterality: Right;    reports that he has never smoked. He has never used smokeless tobacco. He reports that he does not currently use alcohol after a past usage of about 12.0 standard drinks of alcohol per week. He reports current drug use. Drug: Marijuana. family history includes Alzheimer's disease in his mother; Bradycardia (age of onset: 54) in his father; Lymphoma in his mother; Pancreatic cancer in his brother. Allergies  Allergen Reactions   Dust Mite Extract Other (See Comments)    Nasal Congestion   Drug Ingredient [Bee Pollen] Other (See Comments)    Congestion   Other Other (See Comments)    Pet Dander- Runny nose   Current Outpatient Medications on File Prior to Visit  Medication Sig Dispense Refill   acetaminophen (TYLENOL) 500 MG tablet Take 2 tablets (1,000 mg total) by mouth every 6 (six) hours as needed. 30 tablet 0   apixaban (ELIQUIS) 5 MG TABS tablet TAKE 1 TABLET BY MOUTH TWICE A DAY 180 tablet 3   atorvastatin (LIPITOR) 10 MG tablet TAKE 1 TABLET BY MOUTH EVERY DAY 90 tablet 3   Calcium  Polycarbophil (FIBER-CAPS PO) Take 2 tablets by mouth daily.     fexofenadine (ALLEGRA) 180 MG tablet Take 180 mg by mouth in the morning and at bedtime.     fluticasone (FLONASE) 50 MCG/ACT nasal spray Place 2 sprays into the nose daily as needed for allergies. For allergies.     lisinopril (ZESTRIL) 10 MG tablet TAKE 1 TABLET BY MOUTH EVERY DAY 90 tablet 3   naproxen sodium (ALEVE) 220 MG tablet Take 220 mg by mouth.     VITAMIN D PO Take 2 tablets by mouth daily.     amoxicillin (AMOXIL) 500 MG capsule amoxicillin 500 mg capsule  TAKE 4 CAPSULES 1HR PRIOR (Patient not taking: Reported on 12/24/2022)     HYDROcodone-acetaminophen (NORCO/VICODIN) 5-325 MG tablet Take 1 tablet by mouth every 4 (four) hours as needed. (Patient not taking: Reported on 12/21/2021)     No current facility-administered medications on file prior  to visit.        ROS:  All others reviewed and negative.  Objective        PE:  BP 120/62 (BP Location: Left Arm, Patient Position: Sitting, Cuff Size: Large)   Pulse 81   Temp 98.2 F (36.8 C) (Oral)   Ht 5\' 10"  (1.778 m)   Wt 217 lb (98.4 kg)   SpO2 97%   BMI 31.14 kg/m                 Constitutional: Pt appears in NAD               HENT: Head: NCAT.                Right Ear: External ear normal.                 Left Ear: External ear normal.                Eyes: . Pupils are equal, round, and reactive to light. Conjunctivae and EOM are normal               Nose: without d/c or deformity               Neck: Neck supple. Gross normal ROM               Cardiovascular: Normal rate and regular rhythm.                 Pulmonary/Chest: Effort normal and breath sounds without rales or wheezing.                Abd:  Soft, NT, ND, + BS, no organomegaly               Neurological: Pt is alert. At baseline orientation, motor grossly intact but bilat knee contractures noted               Skin: Skin is warm. No rashes, no other new lesions, LE edema - none                Psychiatric: Pt behavior is normal without agitation   Micro: none  Cardiac tracings I have personally interpreted today:  none  Pertinent Radiological findings (summarize): none   Lab Results  Component Value Date   WBC 6.0 12/21/2021   HGB 14.8 12/21/2021   HCT 44.0 12/21/2021   PLT 196.0 12/21/2021   GLUCOSE 93 12/21/2021   CHOL 135 12/21/2021   TRIG 59.0 12/21/2021   HDL 64.30 12/21/2021   LDLDIRECT 142.0 09/29/2012   LDLCALC 59 12/21/2021   ALT 32 12/21/2021   AST 30 12/21/2021   NA 141 12/21/2021   K 4.4 12/21/2021   CL 106 12/21/2021   CREATININE 1.08 12/21/2021   BUN 22 12/21/2021   CO2 28 12/21/2021   TSH 2.18 12/21/2021   PSA 0.41 12/21/2021   INR 1.2 01/10/2021   HGBA1C 5.5 12/21/2021   MICROALBUR 1.0 11/27/2016   Assessment/Plan:  Todd Mallet. is a 72 y.o. White or Caucasian [1] male with  has a past medical history of ALLERGIC RHINITIS (01/08/2010), Allergy, ANEMIA-NOS (01/08/2010), Atrial fibrillation with RVR ([place]) (01/19/2018), CERVICAL RADICULOPATHY, RIGHT (01/08/2010), CHEST PAIN (01/08/2010), DIVERTICULOSIS, COLON (01/08/2010), Dysrhythmia, GERD (gastroesophageal reflux disease), GOUT (01/08/2010), HYPERLIPIDEMIA (01/08/2010), Lumbar disc disease (08/05/2011), Osteoarthritis of right knee, and OSTEOARTHRITIS, KNEES, BILATERAL (01/08/2010).  Chronic anticoagulation To continue eliquis for now, will try to address gait issue and recurrent falls  with PT and refer [facility]  Hyperglycemia Lab Results  Component Value Date   HGBA1C 5.5 12/21/2021   Stable, pt to continue current medical treatment  - diet, wt control   Hyperlipemia Lab Results  Component Value Date   LDLCALC 59 12/21/2021   Stable, pt to continue current statin lipitor 10 mg and f/u lab today   Hypertension BP Readings from Last 3 Encounters:  12/24/22 120/62  12/21/21 128/68  01/30/21 122/76   Stable, pt to continue medical treatment lisinopril 10 mg qd   Vitamin D  deficiency Last vitamin D Lab Results  Component Value Date   VD25OH 56.13 12/21/2021   Stable, cont oral replacement   Chronic right-sided low back pain without sciatica Likely secondary to gait disorder, for tylenol prn  Contractures involving both knees Worsening now with worsening gait difficulty and recent falls - for refer Ortho, also PT eval  Followup: Return in about 1 year (around 12/25/2023).  Todd Cower, MD 12/24/2022 1:05 PM [place]

## 2023-01-02 ENCOUNTER — Encounter (HOSPITAL_COMMUNITY): Payer: Self-pay | Admitting: *Deleted

## 2023-01-29 DIAGNOSIS — M25561 Pain in right knee: Secondary | ICD-10-CM | POA: Diagnosis not present

## 2023-01-29 DIAGNOSIS — M5459 Other low back pain: Secondary | ICD-10-CM | POA: Diagnosis not present

## 2023-01-29 DIAGNOSIS — M25562 Pain in left knee: Secondary | ICD-10-CM | POA: Diagnosis not present

## 2023-02-05 ENCOUNTER — Other Ambulatory Visit: Payer: Self-pay | Admitting: Internal Medicine

## 2023-02-05 DIAGNOSIS — I10 Essential (primary) hypertension: Secondary | ICD-10-CM

## 2023-02-05 DIAGNOSIS — I480 Paroxysmal atrial fibrillation: Secondary | ICD-10-CM

## 2023-02-06 DIAGNOSIS — M1611 Unilateral primary osteoarthritis, right hip: Secondary | ICD-10-CM | POA: Diagnosis not present

## 2023-02-06 DIAGNOSIS — Z96651 Presence of right artificial knee joint: Secondary | ICD-10-CM | POA: Diagnosis not present

## 2023-02-06 DIAGNOSIS — M25561 Pain in right knee: Secondary | ICD-10-CM | POA: Diagnosis not present

## 2023-03-04 ENCOUNTER — Telehealth: Payer: Self-pay | Admitting: Internal Medicine

## 2023-03-04 DIAGNOSIS — M1611 Unilateral primary osteoarthritis, right hip: Secondary | ICD-10-CM | POA: Diagnosis not present

## 2023-03-04 NOTE — Telephone Encounter (Signed)
Patient dropped off document Surgical Clearance, to be filled out by provider. Patient requested to send it via Fax within 7-days. Document is located in providers tray at front office.Please advise at [facility] [phone]

## 2023-03-05 ENCOUNTER — Telehealth: Payer: Self-pay | Admitting: *Deleted

## 2023-03-05 DIAGNOSIS — R931 Abnormal findings on diagnostic imaging of heart and coronary circulation: Secondary | ICD-10-CM

## 2023-03-05 NOTE — Telephone Encounter (Signed)
   Pre-operative Risk Assessment    Patient Name: Todd Barrett.  DOB: 1951-07-09 MRN: [id number]      Request for Surgical Clearance    Procedure:   RIGHT TOTAL HIP ARTHROPLASTY   Date of Surgery:  Clearance TBD                                 Surgeon:  DR. Samson Frederic Surgeon's Group or Practice Name:  [facility] Phone number:  [phone] ATTN: KERRI MAZE Fax number:  [phone]   Type of Clearance Requested:   - Medical  - Pharmacy:  Hold Apixaban (Eliquis)     Type of Anesthesia:  Spinal   Additional requests/questions:    Elpidio Anis   03/05/2023, 11:35 AM

## 2023-03-05 NOTE — Telephone Encounter (Signed)
Called and left a detailed voice message asking patient to give office a call back and ask for Preop Callback pool to get scheduled for an in office preop appointment

## 2023-03-05 NOTE — Telephone Encounter (Signed)
   Name: Todd Barrett.  DOB: 1951/07/01  MRN: [id number]  Primary Cardiologist: Lance Muss, MD  Chart reviewed as part of pre-operative protocol coverage. Because of [name]'s past medical history and time since last visit, he will require a follow-up in-office visit in order to better assess preoperative cardiovascular risk.  Pre-op covering staff: - Please schedule appointment and call patient to inform them. If patient already had an upcoming appointment within acceptable timeframe, please add "pre-op clearance" to the appointment notes so provider is aware. - Please contact requesting surgeon's office via preferred method (i.e, phone, fax) to inform them of need for appointment prior to surgery.  This message will also be routed to pharmacy pool for input on holding Eliquis as requested below so that this information is available to the clearing provider at time of patient's appointment.   Carlos Levering, NP  03/05/2023, 4:59 PM

## 2023-03-05 NOTE — Telephone Encounter (Signed)
Form completion in progress 

## 2023-03-05 NOTE — Telephone Encounter (Signed)
Patient with diagnosis of afib on Eliquis for anticoagulation.    Procedure: right THA Date of procedure: TBD  CHA2DS2-VASc Score = 3  This indicates a 3.2% annual risk of stroke. The patient's score is based upon: CHF History: 0 HTN History: 1 Diabetes History: 0 Stroke History: 0 Vascular Disease History: 1 (elevated CAC) Age Score: 1 Gender Score: 0  CrCl 15mL/min Platelet count 213K  Per office protocol, patient can hold Eliquis for 3 days prior to procedure.    **This guidance is not considered finalized until pre-operative APP has relayed final recommendations.**

## 2023-03-06 NOTE — Telephone Encounter (Signed)
S/w pt and  has been scheduled for in office appt per pre op APP 03/07/23 @ 8 am with Jari Favre, PAC. I will update all parties involved.

## 2023-03-06 NOTE — Progress Notes (Signed)
Office Visit    Patient Name: Todd PaceRichard K Hampshire Jr. Date of Encounter: 03/07/2023  PCP:  Corwin LevinsJohn, [name], MD   [facility]  Cardiologist:  Lance MussJayadeep Varanasi, MD  Advanced Practice Provider:  No care team member to display Electrophysiologist:  None   HPI    Todd PaceRichard K Stokely Jr. is a 72 y.o. male with past medical history of hyperlipidemia, hypertension, OSA, atrial fibrillation presents today for preop clearance.  He was last seen by the atrial fibrillation clinic back in 2022.  Initially diagnosed with atrial fibrillation in 2019 after total knee replacement when he was noted to have fast, irregular heart rate while working with physical therapy.  He was treated with Cardizem and then placed on Xarelto.  Cardioversion was planned but he went back into normal sinus rhythm prior to hospital discharge.  He was later taken off of Xarelto and Cardizem when he had no recurrence of A-fib.  It was presumed to be only in the postoperative period.  He later had an outpatient Holter monitor which did not reveal recurrence of atrial fibrillation.  Patient was seen in the ED 12/21/2020 with abdominal pain and he was found to be in rate controlled A-fib.  He was started on Eliquis for CHA2DS2-VASc score of 2.  He returned to the ED 01/06/2021 with chest pain and was admitted.  Workup negative for ACS but he was diagnosed with acute gallstone pancreatitis and underwent cholecystectomy.  Converted to sinus rhythm spontaneously during admission.  When he was last seen in the atrial fibrillation clinic he was doing well from a cardiovascular standpoint.  He was unaware of his A-fib while in the hospital.  He denies any bleeding issues on anticoagulation.  He drinks about 10 alcoholic drinks per month but admits he had more to drink on vacation just prior to onset of his symptoms.  He does admit to snoring and witnessed apnea.  He denies symptoms palpitations, chest pain.  Tolerating  medications.  Today, he tells me that he feels good.  No cardiac symptoms.  If he is in A-fib he does not know it, asymptomatic.  No issues with his medications.  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  Needs to rest occasionally.  He has had many surgeries on his knees, shoulders, back, and now needs hip surgery.  No chest pain or shortness of breath  He is okay to hold Eliquis x 3 days prior to surgery.  Please restart when medically safe to do so.  Past Medical History    Past Medical History:  Diagnosis Date   ALLERGIC RHINITIS 01/08/2010   Allergy    dust mites, pet dander   ANEMIA-NOS 01/08/2010   Atrial fibrillation with RVR 01/19/2018   CERVICAL RADICULOPATHY, RIGHT 01/08/2010   CHEST PAIN 01/08/2010   DIVERTICULOSIS, COLON 01/08/2010   Dysrhythmia    post op   during PT a fib RVR   GERD (gastroesophageal reflux disease)    Zantac OTC  prn   GOUT 01/08/2010   in past/ when has surgery   HYPERLIPIDEMIA 01/08/2010   Lumbar disc disease 08/05/2011   Osteoarthritis of right knee    end stage   OSTEOARTHRITIS, KNEES, 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   Past Surgical History:  Procedure Laterality Date   ANTERIOR CERVICAL DECOMP/DISCECTOMY FUSION  07/23/2012   Procedure: ANTERIOR CERVICAL DECOMPRESSION/DISCECTOMY FUSION 2 LEVELS;  Surgeon: Clydene FakeJames R Hirsch,  MD;  Location: [facility];  Service: Neurosurgery;  Laterality: N/A;  Cervical three-four, four-five, redo Cervical six-seven anterior cervical decompression, discectomy fusion with allograft and plating   BACK SURGERY  03/2004, 02/2005   CERVICAL SPINE SURGERY  05/2010   CHOLECYSTECTOMY N/A 01/11/2021   Procedure: LAPAROSCOPIC CHOLECYSTECTOMY;  Surgeon: Axel Filler, MD;  Location: [facility] OR;  Service: General;  Laterality: N/A;   clavicle facture  nov. 2009   100% displacement   COLONOSCOPY     ERCP N/A 01/10/2021   Procedure: ENDOSCOPIC RETROGRADE CHOLANGIOPANCREATOGRAPHY (ERCP);   Surgeon: Meryl Dare, MD;  Location: [facility] ENDOSCOPY;  Service: Endoscopy;  Laterality: N/A;   HERNIA REPAIR     inguinal hernia/right side/ 72 years old   JOINT REPLACEMENT     Left knee 2006   KNEE ARTHROSCOPY     total of 5   left knee arthroscopy  september 2005   lumbar disc surgury  02/2002   lumbar disc surgury  june 2004   NASAL RECONSTRUCTION     x 3   REMOVAL OF STONES  01/10/2021   Procedure: REMOVAL OF STONES;  Surgeon: Meryl Dare, MD;  Location[facility] ENDOSCOPY;  Service: Endoscopy;;   REVERSE SHOULDER ARTHROPLASTY Left 07/30/2018   Procedure: LEFT REVERSE SHOULDER ARTHROPLASTY;  Surgeon: Francena Hanly, MD;  Location: [facility] OR;  Service: Orthopedics;  Laterality: Left;    s/p ENT surgury     x 3   s/p left shoulder surgury  feb. 2009   s/p left TKR  april 2006   SPHINCTEROTOMY  01/10/2021   Procedure: SPHINCTEROTOMY;  Surgeon: Meryl Dare, MD;  Location: [facility] ENDOSCOPY;  Service: Endoscopy;;   TOTAL KNEE ARTHROPLASTY Right 01/16/2018   Procedure: RIGHT TOTAL KNEE ARTHROPLASTY;  Surgeon: Beverely Low, MD;  Location: [facility] OR;  Service: Orthopedics;  Laterality: Right;   TOTAL SHOULDER ARTHROPLASTY Right 09/16/2019   Procedure: Right Reverse shoulder arthroplasty;  Surgeon: Francena Hanly, MD;  Location: [facility];  Service: Orthopedics;  Laterality: Right;    Allergies  Allergies  Allergen Reactions   Dust Mite Extract Other (See Comments)    Nasal Congestion   Drug Ingredient [Bee Pollen] Other (See Comments)    Congestion   Other Other (See Comments)    Pet Dander- Runny nose     EKGs/Labs/Other Studies Reviewed:   The following studies were reviewed today: Echo 01/07/21 demonstrated  1. Left ventricular ejection fraction, by estimation, is 50 to 55%. The  left ventricle has low normal function. The left ventricle has no regional  wall motion abnormalities. Left ventricular diastolic parameters were  normal.   2. Right ventricular systolic function is  normal. The right ventricular  size is normal.   3. The mitral valve is grossly normal. No evidence of mitral valve  regurgitation.   4. The aortic valve is tricuspid. There is mild calcification of the  aortic valve. There is mild thickening of the aortic valve. Aortic valve  regurgitation is not visualized. No aortic stenosis is present.   5. Aortic dilatation noted. 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 There is borderline dilatation of the  ascending aorta, measuring 37 mm. There is mild dilatation of the  transverse aorta, measuring 37 mm.   Comparison(s): A prior study was performed on 01/29/2018. Prior images  reviewed side by side. Slight decrease in LV function, aortic dilation  noted.    Epic records are reviewed at length today  CHA2DS2-VASc Score = 2  The patient's score is based upon: CHF History: No HTN History: Yes Diabetes History: No Stroke History: No Vascular Disease History: No Age Score: 1 Gender Score: 0  EKG:  EKG is  ordered today.  The ekg ordered today demonstrates atrial fibrillation, rate 99 bpm  Recent Labs: 12/24/2022: ALT 28; BUN 22; Creatinine, Ser 1.04; Hemoglobin 15.9; Platelets 213.0; Potassium 4.6; Sodium 139; TSH 2.37  Recent Lipid Panel    Component Value Date/Time   CHOL 149 12/24/2022 1053   TRIG 65.0 12/24/2022 1053   TRIG 93 10/23/2006 0848   HDL 67.60 12/24/2022 1053   CHOLHDL 2 12/24/2022 1053   VLDL 13.0 12/24/2022 1053   LDLCALC 69 12/24/2022 1053   LDLDIRECT 142.0 09/29/2012 0938    Home Medications   Current Meds  Medication Sig   amoxicillin (AMOXIL) 500 MG capsule    atorvastatin (LIPITOR) 10 MG tablet TAKE 1 TABLET BY MOUTH EVERY DAY   Calcium Polycarbophil (FIBER-CAPS PO) Take 2 tablets by mouth daily.   carvedilol (COREG) 3.125 MG tablet Take 1 tablet (3.125 mg total) by mouth 2 (two) times daily.   ELIQUIS 5 MG TABS tablet Take 1 tablet by mouth twice daily   fexofenadine  (ALLEGRA) 180 MG tablet Take 180 mg by mouth in the morning and at bedtime.   fluticasone (FLONASE) 50 MCG/ACT nasal spray Place 2 sprays into the nose daily as needed for allergies. For allergies.   lisinopril (ZESTRIL) 10 MG tablet Take 1 tablet by mouth once daily   naproxen sodium (ALEVE) 220 MG tablet Take 220 mg by mouth daily as needed.   VITAMIN D PO Take 2 tablets by mouth daily.     Review of Systems      All other systems reviewed and are otherwise negative except as noted above.  Physical Exam    VS:  BP 120/72   Pulse 99   Ht 5\' 10"  (1.778 m)   Wt 224 lb (101.6 kg)   BMI 32.14 kg/m  , BMI Body mass index is 32.14 kg/m.  Wt Readings from Last 3 Encounters:  03/07/23 224 lb (101.6 kg)  12/24/22 217 lb (98.4 kg)  12/21/21 217 lb 3.2 oz (98.5 kg)     GEN: Well nourished, well developed, in no acute distress. HEENT: normal. Neck: Supple, no JVD, carotid bruits, or masses. Cardiac: RRR, no murmurs, rubs, or gallops. No clubbing, cyanosis, edema.  Radials/PT 2+ and equal bilaterally.  Respiratory:  Respirations regular and unlabored, clear to auscultation bilaterally. GI: Soft, nontender, nondistended. MS: No deformity or atrophy. Skin: Warm and dry, no rash. Neuro:  Strength and sensation are intact. Psych: Normal affect.  Assessment & Plan    Preop clearance  Mr. Lober perioperative risk of a major cardiac event is 0.4% according to the Revised Cardiac Risk Index (RCRI).  Therefore, he is at low risk for perioperative complications.   His functional capacity is good at 6.36 METs according to the Duke Activity Status Index (DASI). Recommendations: According to ACC/AHA guidelines, no further cardiovascular testing needed.  The patient may proceed to surgery at acceptable risk.   Antiplatelet and/or Anticoagulation Recommendations:  Eliquis (Apixaban) can be held for 3 days prior to surgery.  Please resume post op when felt to be safe.    Paroxysmal atrial  fibrillation -rate controlled Afib today, rate 99 bpm -plan to start coreg 3.125mg  BID -monitor HR at home  Snoring/witnessed apnea -Sleep study performed and was negative for sleep  apnea, no CPAP needed  Hypertension -Well-controlled today 120/72 -Continue current medication including lisinopril 10 mg once daily, Lipitor 10 mg daily, Eliquis 5 mg twice daily, added coreg         Disposition: Follow up 1 year with Lance Muss, MD or APP.  Signed, Sharlene Dory, PA-C 03/07/2023, 8:34 AM [facility]

## 2023-03-07 ENCOUNTER — Encounter: Payer: Self-pay | Admitting: Physician Assistant

## 2023-03-07 ENCOUNTER — Ambulatory Visit: Payer: Medicare Other | Attending: Physician Assistant | Admitting: Physician Assistant

## 2023-03-07 VITALS — BP 120/72 | HR 99 | Ht 70.0 in | Wt 224.0 lb

## 2023-03-07 DIAGNOSIS — Z01810 Encounter for preprocedural cardiovascular examination: Secondary | ICD-10-CM | POA: Diagnosis not present

## 2023-03-07 DIAGNOSIS — I10 Essential (primary) hypertension: Secondary | ICD-10-CM | POA: Diagnosis not present

## 2023-03-07 DIAGNOSIS — R0683 Snoring: Secondary | ICD-10-CM

## 2023-03-07 DIAGNOSIS — I480 Paroxysmal atrial fibrillation: Secondary | ICD-10-CM | POA: Insufficient documentation

## 2023-03-07 MED ORDER — CARVEDILOL 3.125 MG PO TABS: 3.1250 mg | ORAL_TABLET | Freq: Two times a day (BID) | ORAL | 3 refills | Status: DC

## 2023-03-07 NOTE — Patient Instructions (Signed)
Medication Instructions:  1.Start carvedilol (Coreg) 3.125 mg twice daily *If you need a refill on your cardiac medications before your next appointment, please call your pharmacy*  Lab Work: None If you have labs (blood work) drawn today and your tests are completely normal, you will receive your results only by: MyChart Message (if you have MyChart) OR A paper copy in the mail If you have any lab test that is abnormal or we need to change your treatment, we will call you to review the results.  Follow-Up: At [facility], you and your health needs are our priority.  As part of our continuing mission to provide you with exceptional heart care, we have created designated Provider Care Teams.  These Care Teams include your primary Cardiologist (physician) and Advanced Practice Providers (APPs -  Physician Assistants and Nurse Practitioners) who all work together to provide you with the care you need, when you need it.   Your next appointment:   1 year(s)  Provider:   Lance Muss, MD

## 2023-04-12 ENCOUNTER — Ambulatory Visit: Payer: Self-pay | Admitting: Student

## 2023-04-16 NOTE — Progress Notes (Addendum)
Anesthesia Review:  PCP: Oliver Barre LOV 12/24/22 clearance 03/10/23 on chart  Cardiologist : 03/07/23 Jari Favre, PA Preop eval on chart  Chest x-ray : EKG : 03/10/23  CT cors- 2022  Echo : 01/07/21  Stress test: Cardiac Cath :  Activity level:  Sleep Study/ CPAP : Fasting Blood Sugar :      / Checks Blood Sugar -- times a day:   Blood Thinner/ Instructions /Last Dose: ASA / Instructions/ Last Dose :    Eliquis

## 2023-04-22 NOTE — Patient Instructions (Signed)
SURGICAL WAITING ROOM VISITATION  Patients having surgery or a procedure may have no more than 2 support people in the waiting area - these visitors may rotate.    Children under the age of 55 must have an adult with them who is not the patient.  Due to an increase in RSV and influenza rates and associated hospitalizations, children ages 42 and under may not visit patients in [facility] hospitals.  If the patient needs to stay at the hospital during part of their recovery, the visitor guidelines for inpatient rooms apply. Pre-op nurse will coordinate an appropriate time for 1 support person to accompany patient in pre-op.  This support person may not rotate.    Please refer to the [facility] website for the visitor guidelines for Inpatients (after your surgery is over and you are in a regular room).       Your procedure is scheduled on:  05/01/23    Report to [facility] Main Entrance    Report to admitting at   1015AM   Call this number if you have problems the morning of surgery [phone]   Do not eat food :After Midnight.   After Midnight you may have the following liquids until __ 0945____ AM  DAY OF SURGERY  Water Non-Citrus Juices (without pulp, NO RED-Apple, White grape, White cranberry) Black Coffee (NO MILK/CREAM OR CREAMERS, sugar ok)  Clear Tea (NO MILK/CREAM OR CREAMERS, sugar ok) regular and decaf                             Plain Jell-O (NO RED)                                           Fruit ices (not with fruit pulp, NO RED)                                     Popsicles (NO RED)                                                               Sports drinks like Gatorade (NO RED)                     The day of surgery:  Drink ONE (1) Pre-Surgery Clear Ensure or G2 at  0945 AM  ( have completed by ) the morning of surgery. Drink in one sitting. Do not sip.  This drink was given to you during your hospital    pre-op appointment visit. Nothing else to  drink after completing the  Pre-Surgery Clear Ensure or G2.          If you have questions, please contact your surgeon's office.      Oral Hygiene is also important to reduce your risk of infection.                                    Remember - BRUSH YOUR TEETH THE MORNING  OF SURGERY WITH YOUR REGULAR TOOTHPASTE  DENTURES WILL BE REMOVED PRIOR TO SURGERY PLEASE DO NOT APPLY "Poly grip" OR ADHESIVES!!!   Do NOT smoke after Midnight   Take these medicines the morning of surgery with A SIP OF WATER:  coreg, allegra, flonase if needed   DO NOT TAKE ANY ORAL DIABETIC MEDICATIONS DAY OF YOUR SURGERY  Bring CPAP mask and tubing day of surgery.                              You may not have any metal on your body including hair pins, jewelry, and body piercing             Do not wear make-up, lotions, powders, perfumes/cologne, or deodorant  Do not wear nail polish including gel and S&S, artificial/acrylic nails, or any other type of covering on natural nails including finger and toenails. If you have artificial nails, gel coating, etc. that needs to be removed by a nail salon please have this removed prior to surgery or surgery may need to be canceled/ delayed if the surgeon/ anesthesia feels like they are unable to be safely monitored.   Do not shave  48 hours prior to surgery.               Men may shave face and neck.   Do not bring valuables to the hospital. [facility] IS NOT             RESPONSIBLE   FOR VALUABLES.   Contacts, glasses, dentures or bridgework may not be worn into surgery.   Bring small overnight bag day of surgery.   DO NOT BRING YOUR HOME MEDICATIONS TO THE HOSPITAL. PHARMACY WILL DISPENSE MEDICATIONS LISTED ON YOUR MEDICATION LIST TO YOU DURING YOUR ADMISSION IN THE HOSPITAL!    Patients discharged on the day of surgery will not be allowed to drive home.  Someone NEEDS to stay with you for the first 24 hours after anesthesia.   Special Instructions: Bring a  copy of your healthcare power of attorney and living will documents the day of surgery if you haven't scanned them before.              Please read over the following fact sheets you were given: IF YOU HAVE QUESTIONS ABOUT YOUR PRE-OP INSTRUCTIONS PLEASE CALL [phone]   If you received a COVID test during your pre-op visit  it is requested that you wear a mask when out in public, stay away from anyone that may not be feeling well and notify your surgeon if you develop symptoms. If you test positive for Covid or have been in contact with anyone that has tested positive in the last 10 days please notify you surgeon.      Pre-operative 5 CHG Bath Instructions   You can play a key role in reducing the risk of infection after surgery. Your skin needs to be as free of germs as possible. You can reduce the number of germs on your skin by washing with CHG (chlorhexidine gluconate) soap before surgery. CHG is an antiseptic soap that kills germs and continues to kill germs even after washing.   DO NOT use if you have an allergy to chlorhexidine/CHG or antibacterial soaps. If your skin becomes reddened or irritated, stop using the CHG and notify one of our RNs at [phone].   Please shower with the CHG soap starting 4 days before surgery using  the following schedule:     Please keep in mind the following:  DO NOT shave, including legs and underarms, starting the day of your first shower.   You may shave your face at any point before/day of surgery.  Place clean sheets on your bed the day you start using CHG soap. Use a clean washcloth (not used since being washed) for each shower. DO NOT sleep with pets once you start using the CHG.   CHG Shower Instructions:  If you choose to wash your hair and private area, wash first with your normal shampoo/soap.  After you use shampoo/soap, rinse your hair and body thoroughly to remove shampoo/soap residue.  Turn the water OFF and apply about 3  tablespoons (45 ml) of CHG soap to a CLEAN washcloth.  Apply CHG soap ONLY FROM YOUR NECK DOWN TO YOUR TOES (washing for 3-5 minutes)  DO NOT use CHG soap on face, private areas, open wounds, or sores.  Pay special attention to the area where your surgery is being performed.  If you are having back surgery, having someone wash your back for you may be helpful. Wait 2 minutes after CHG soap is applied, then you may rinse off the CHG soap.  Pat dry with a clean towel  Put on clean clothes/pajamas   If you choose to wear lotion, please use ONLY the CHG-compatible lotions on the back of this paper.     Additional instructions for the day of surgery: DO NOT APPLY any lotions, deodorants, cologne, or perfumes.   Put on clean/comfortable clothes.  Brush your teeth.  Ask your nurse before applying any prescription medications to the skin.      CHG Compatible Lotions   Aveeno Moisturizing lotion  Cetaphil Moisturizing Cream  Cetaphil Moisturizing Lotion  Clairol Herbal Essence Moisturizing Lotion, Dry Skin  Clairol Herbal Essence Moisturizing Lotion, Extra Dry Skin  Clairol Herbal Essence Moisturizing Lotion, Normal Skin  Curel Age Defying Therapeutic Moisturizing Lotion with Alpha Hydroxy  Curel Extreme Care Body Lotion  Curel Soothing Hands Moisturizing Hand Lotion  Curel Therapeutic Moisturizing Cream, Fragrance-Free  Curel Therapeutic Moisturizing Lotion, Fragrance-Free  Curel Therapeutic Moisturizing Lotion, Original Formula  Eucerin Daily Replenishing Lotion  Eucerin Dry Skin Therapy Plus Alpha Hydroxy Crme  Eucerin Dry Skin Therapy Plus Alpha Hydroxy Lotion  Eucerin Original Crme  Eucerin Original Lotion  Eucerin Plus Crme Eucerin Plus Lotion  Eucerin TriLipid Replenishing Lotion  Keri Anti-Bacterial Hand Lotion  Keri Deep Conditioning Original Lotion Dry Skin Formula Softly Scented  Keri Deep Conditioning Original Lotion, Fragrance Free Sensitive Skin Formula  Keri  Lotion Fast Absorbing Fragrance Free Sensitive Skin Formula  Keri Lotion Fast Absorbing Softly Scented Dry Skin Formula  Keri Original Lotion  Keri Skin Renewal Lotion Keri Silky Smooth Lotion  Keri Silky Smooth Sensitive Skin Lotion  Nivea Body Creamy Conditioning Oil  Nivea Body Extra Enriched Teacher, adult education Moisturizing Lotion Nivea Crme  Nivea Skin Firming Lotion  NutraDerm 30 Skin Lotion  NutraDerm Skin Lotion  NutraDerm Therapeutic Skin Cream  NutraDerm Therapeutic Skin Lotion  ProShield Protective Hand Cream  Provon moisturizing lotion

## 2023-04-23 ENCOUNTER — Encounter (HOSPITAL_COMMUNITY)
Admission: RE | Admit: 2023-04-23 | Discharge: 2023-04-23 | Disposition: A | Discharge status: Home / Self Care | Payer: Medicare Other | Source: Ambulatory Visit | Attending: Orthopedic Surgery | Admitting: Orthopedic Surgery

## 2023-04-23 ENCOUNTER — Encounter (HOSPITAL_COMMUNITY): Payer: Self-pay

## 2023-04-23 ENCOUNTER — Other Ambulatory Visit: Payer: Self-pay

## 2023-04-23 VITALS — BP 137/94 | HR 60 | Temp 97.7°F | Resp 16 | Ht 70.0 in | Wt 214.0 lb

## 2023-04-23 DIAGNOSIS — K219 Gastro-esophageal reflux disease without esophagitis: Secondary | ICD-10-CM | POA: Insufficient documentation

## 2023-04-23 DIAGNOSIS — I2510 Atherosclerotic heart disease of native coronary artery without angina pectoris: Secondary | ICD-10-CM | POA: Insufficient documentation

## 2023-04-23 DIAGNOSIS — Z7901 Long term (current) use of anticoagulants: Secondary | ICD-10-CM | POA: Diagnosis not present

## 2023-04-23 DIAGNOSIS — I10 Essential (primary) hypertension: Secondary | ICD-10-CM | POA: Diagnosis not present

## 2023-04-23 DIAGNOSIS — I700 Atherosclerosis of aorta: Secondary | ICD-10-CM | POA: Diagnosis not present

## 2023-04-23 DIAGNOSIS — Z01818 Encounter for other preprocedural examination: Secondary | ICD-10-CM

## 2023-04-23 DIAGNOSIS — I493 Ventricular premature depolarization: Secondary | ICD-10-CM | POA: Diagnosis not present

## 2023-04-23 DIAGNOSIS — Z01812 Encounter for preprocedural laboratory examination: Secondary | ICD-10-CM | POA: Insufficient documentation

## 2023-04-23 DIAGNOSIS — M1611 Unilateral primary osteoarthritis, right hip: Secondary | ICD-10-CM | POA: Insufficient documentation

## 2023-04-23 DIAGNOSIS — I4891 Unspecified atrial fibrillation: Secondary | ICD-10-CM | POA: Diagnosis not present

## 2023-04-23 HISTORY — DX: Essential (primary) hypertension: I10

## 2023-04-23 LAB — CBC
HCT: 43.4 % (ref 39.0–52.0)
Hemoglobin: 15 g/dL (ref 13.0–17.0)
MCH: 31.6 pg (ref 26.0–34.0)
MCHC: 34.6 g/dL (ref 30.0–36.0)
MCV: 91.4 fL (ref 80.0–100.0)
Platelets: 184 10*3/uL (ref 150–400)
RBC: 4.75 MIL/uL (ref 4.22–5.81)
RDW: 12.7 % (ref 11.5–15.5)
WBC: 6.5 10*3/uL (ref 4.0–10.5)
nRBC: 0 % (ref 0.0–0.2)

## 2023-04-23 LAB — BASIC METABOLIC PANEL
Anion gap: 10 (ref 5–15)
BUN: 21 mg/dL (ref 8–23)
CO2: 23 mmol/L (ref 22–32)
Calcium: 9.3 mg/dL (ref 8.9–10.3)
Chloride: 107 mmol/L (ref 98–111)
Creatinine, Ser: 1.05 mg/dL (ref 0.61–1.24)
GFR, Estimated: >60 mL/min (ref >60)
Glucose, Bld: 118 mg/dL — ABNORMAL HIGH (ref 70–99)
Potassium: 4.5 mmol/L (ref 3.5–5.1)
Sodium: 140 mmol/L (ref 135–145)

## 2023-04-23 LAB — SURGICAL PCR SCREEN
MRSA, PCR: NEGATIVE
Staphylococcus aureus: NEGATIVE

## 2023-04-24 ENCOUNTER — Encounter (HOSPITAL_COMMUNITY): Payer: Self-pay

## 2023-04-24 NOTE — Anesthesia Preprocedure Evaluation (Addendum)
Anesthesia Evaluation  Patient identified by MRN, date of birth, ID band Patient awake    Reviewed: Allergy & Precautions, NPO status , Patient's Chart, lab work & pertinent test results  History of Anesthesia Complications Negative for: history of anesthetic complications  Airway Mallampati: II  TM Distance: >3 FB Neck ROM: Full    Dental  (+) Teeth Intact, Dental Advisory Given   Pulmonary neg pulmonary ROS   breath sounds clear to auscultation       Cardiovascular hypertension, Pt. on medications + CAD  (-) Past MI and (-) CHF + dysrhythmias Atrial Fibrillation  Rhythm:Regular     Neuro/Psych  PSYCHIATRIC DISORDERS Anxiety      Neuromuscular disease    GI/Hepatic Neg liver ROS,,,  Endo/Other  negative endocrine ROS    Renal/GU negative Renal ROSLab Results      Component                Value               Date                      CREATININE               1.05                04/23/2023                Musculoskeletal  (+) Arthritis ,    Abdominal   Peds  Hematology Lab Results      Component                Value               Date                      WBC                      6.5                 04/23/2023                HGB                      15.0                04/23/2023                HCT                      43.4                04/23/2023                MCV                      91.4                04/23/2023                PLT                      184                 04/23/2023             Eliquis last dose Sunday 6/2   Anesthesia Other Findings  Reproductive/Obstetrics                             Anesthesia Physical Anesthesia Plan  ASA: 2  Anesthesia Plan: MAC and Spinal   Post-op Pain Management: Tylenol PO (pre-op)*   Induction: Intravenous  PONV Risk Score and Plan: 1 and Propofol infusion and Treatment may vary due to age or medical condition  Airway  Management Planned: Nasal Cannula and Natural Airway  Additional Equipment: None  Intra-op Plan:   Post-operative Plan:   Informed Consent: I have reviewed the patients History and Physical, chart, labs and discussed the procedure including the risks, benefits and alternatives for the proposed anesthesia with the patient or authorized representative who has indicated his/her understanding and acceptance.     Dental advisory given  Plan Discussed with: CRNA  Anesthesia Plan Comments: (See PAT note from 5/29 by Sherlie Ban PA-C )        Anesthesia Quick Evaluation

## 2023-04-24 NOTE — Progress Notes (Signed)
Case: [id number] Date/Time: 05/01/23 1230   Procedure: TOTAL HIP ARTHROPLASTY ANTERIOR APPROACH (Right: Hip) - 140   Anesthesia type: Spinal   Pre-op diagnosis: Right hip osteoarthritis   [facility] ROOM 10 / [facility]   Surgeons: Samson Frederic, MD       DISCUSSION: Todd Barrett is a 72 year old male who presents to PAT prior to surgery listed above.  Patient with history of A-fib on Eliquis, hypertension, GERD.  Past surgical history significant for prior cervical fusion in 2013.  Patient was initially diagnosed with A-fib in 2019 after right TKA. Patient last saw Cardiology on 03/07/23 for this and preop risk assessment and clearance was provided:  "[name]'s perioperative risk of a major cardiac event is 0.4% according to the Revised Cardiac Risk Index (RCRI).  Therefore, he is at low risk for perioperative complications.   His functional capacity is good at 6.36 METs according to the Duke Activity Status Index (DASI). Recommendations: According to ACC/AHA guidelines, no further cardiovascular testing needed.  The patient may proceed to surgery at acceptable risk.   Antiplatelet and/or Anticoagulation Recommendations:   Eliquis (Apixaban) can be held for 3 days prior to surgery.  Please resume post op when felt to be safe."  Last dose of Eliquis will be 6/2   VS: BP (!) 137/94   Pulse 60   Temp 36.5 C (Oral)   Resp 16   Ht 5\' 10"  (1.778 m)   Wt 97.1 kg   SpO2 97%   BMI 30.71 kg/m   PROVIDERS: Corwin Levins, MD Cardiology: Lance Muss, MD   LABS: Labs reviewed: Acceptable for surgery. (all labs ordered are listed, but only abnormal results are displayed)  Labs Reviewed  BASIC METABOLIC PANEL - Abnormal; Notable for the following components:      Result Value   Glucose, Bld 118 (*)    All other components within normal limits  SURGICAL PCR SCREEN  CBC     IMAGES: n/a   EKG 03/07/23:  A.fib, rate 99   CV:  Echo 01/07/2021:  1. Left  ventricular ejection fraction, by estimation, is 50 to 55%. The  left ventricle has low normal function. The left ventricle has no regional  wall motion abnormalities. Left ventricular diastolic parameters were  normal.   2. Right ventricular systolic function is normal. The right ventricular  size is normal.   3. The mitral valve is grossly normal. No evidence of mitral valve  regurgitation.   4. The aortic valve is tricuspid. There is mild calcification of the  aortic valve. There is mild thickening of the aortic valve. Aortic valve  regurgitation is not visualized. No aortic stenosis is present.   5. Aortic dilatation noted. 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 There is borderline dilatation of the  ascending aorta, measuring 37 mm. There is mild dilatation of the  transverse aorta, measuring 37 mm.   Comparison(s): A prior study was performed on 01/29/2018. Prior images  reviewed side by side. Slight decrease in LV function, aortic dilation  noted.   CT coronary 01/07/21:    IMPRESSION: 1. Coronary calcium score of 36. This was 29th percentile for age, sex, and race matched control.   2. Normal coronary origin with right dominance.   3. Aortic Atherosclerosis noted.   4. CAD-RADS 1. 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 Consider non-atherosclerotic causes of chest pain. Consider preventive therapy and risk factor modification.  Telemetry 05/02/20:  Normal sinus rhythm with occasional PACs and rare brief runs of PVCs. No atrial fibrillation.    Past Medical History:  Diagnosis Date   ALLERGIC RHINITIS 01/08/2010   Allergy    dust mites, pet dander   Atrial fibrillation with RVR (HCC) 01/19/2018   CERVICAL RADICULOPATHY, RIGHT 01/08/2010   CHEST PAIN 01/08/2010   DIVERTICULOSIS, COLON 01/08/2010   Dysrhythmia    post op   during PT a fib RVR   GERD (gastroesophageal reflux disease)    Zantac OTC  prn   GOUT  01/08/2010   in past/ when has surgery   HYPERLIPIDEMIA 01/08/2010   Hypertension    Lumbar disc disease 08/05/2011   Osteoarthritis of right knee    end stage   OSTEOARTHRITIS, KNEES, 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    Past Surgical History:  Procedure Laterality Date   ANTERIOR CERVICAL DECOMP/DISCECTOMY FUSION  07/23/2012   Procedure: ANTERIOR CERVICAL DECOMPRESSION/DISCECTOMY FUSION 2 LEVELS;  Surgeon: Clydene Fake, MD;  Location: [facility];  Service: Neurosurgery;  Laterality: N/A;  Cervical three-four, four-five, redo Cervical six-seven anterior cervical decompression, discectomy fusion with allograft and plating   BACK SURGERY  03/2004, 02/2005   CERVICAL SPINE SURGERY  05/2010   CHOLECYSTECTOMY N/A 01/11/2021   Procedure: LAPAROSCOPIC CHOLECYSTECTOMY;  Surgeon: Axel Filler, MD;  Location: [facility] OR;  Service: General;  Laterality: N/A;   clavicle facture  nov. 2009   100% displacement   COLONOSCOPY     ERCP N/A 01/10/2021   Procedure: ENDOSCOPIC RETROGRADE CHOLANGIOPANCREATOGRAPHY (ERCP);  Surgeon: Meryl Dare, MD;  Location: [facility] ENDOSCOPY;  Service: Endoscopy;  Laterality: N/A;   HERNIA REPAIR     inguinal hernia/right side/ 72 years old   JOINT REPLACEMENT     Left knee 2006   KNEE ARTHROSCOPY     total of 5   left knee arthroscopy  september 2005   lumbar disc surgury  02/2002   lumbar disc surgury  june 2004   NASAL RECONSTRUCTION     x 3   REMOVAL OF STONES  01/10/2021   Procedure: REMOVAL OF STONES;  Surgeon: Meryl Dare, MD;  Location: [facility] ENDOSCOPY;  Service: Endoscopy;;   REVERSE SHOULDER ARTHROPLASTY Left 07/30/2018   Procedure: LEFT REVERSE SHOULDER ARTHROPLASTY;  Surgeon: Francena Hanly, MD;  Location: [facility] OR;  Service: Orthopedics;  Laterality: Left;    s/p ENT surgury     x 3   s/p left shoulder surgury  feb. 2009   s/p left TKR  april 2006   SPHINCTEROTOMY  01/10/2021   Procedure: SPHINCTEROTOMY;  Surgeon: Meryl Dare, MD;  Location: [facility] ENDOSCOPY;   Service: Endoscopy;;   TOTAL KNEE ARTHROPLASTY Right 01/16/2018   Procedure: RIGHT TOTAL KNEE ARTHROPLASTY;  Surgeon: Beverely Low, MD;  Location: [facility] OR;  Service: Orthopedics;  Laterality: Right;   TOTAL SHOULDER ARTHROPLASTY Right 09/16/2019   Procedure: Right Reverse shoulder arthroplasty;  Surgeon: Francena Hanly, MD;  Location: [facility];  Service: Orthopedics;  Laterality: Right;    MEDICATIONS:  atorvastatin (LIPITOR) 10 MG tablet   Calcium Polycarbophil (FIBER-CAPS PO)   carvedilol (COREG) 3.125 MG tablet   Cholecalciferol (VITAMIN D3) 50 MCG (2000 UT) capsule   ELIQUIS 5 MG TABS tablet   fexofenadine (ALLEGRA) 180 MG tablet   fluticasone (FLONASE) 50 MCG/ACT nasal spray   lisinopril (ZESTRIL) 10 MG tablet   naproxen sodium (ALEVE) 220 MG tablet   No current facility-administered medications for this encounter.   Ubaldo Glassing, PA-C [facility] Surgical Short  Stay/Anesthesiology [facility] Phone [phone] 04/24/2023 2:42 PM

## 2023-04-29 ENCOUNTER — Ambulatory Visit: Payer: Self-pay | Admitting: Student

## 2023-04-29 NOTE — H&P (View-Only) (Signed)
TOTAL HIP ADMISSION H&P  Patient is admitted for right total hip arthroplasty.  Subjective:  Chief Complaint: right hip pain  HPI: Todd Barrett., 72 y.o. male, has a history of pain and functional disability in the right hip(s) due to arthritis and patient has failed non-surgical conservative treatments for greater than 12 weeks to include NSAID's and/or analgesics, flexibility and strengthening excercises, use of assistive devices, and activity modification.  Onset of symptoms was gradual starting 10 years ago with rapidlly worsening course since that time.The patient noted no past surgery on the right hip(s).  Patient currently rates pain in the right hip at 10 out of 10 with activity. Patient has night pain, worsening of pain with activity and weight bearing, trendelenberg gait, pain that interfers with activities of daily living, and pain with passive range of motion. Patient has evidence of subchondral cysts, subchondral sclerosis, periarticular osteophytes, and joint space narrowing by imaging studies. This condition presents safety issues increasing the risk of falls.  There is no current active infection.  Patient Active Problem List   Diagnosis Date Noted   Elevated coronary artery calcium score 03/05/2023   Chronic right-sided low back pain without sciatica 12/24/2022   Contractures involving both knees 12/24/2022   Chronic anticoagulation 12/31/2021   Pain of left hip joint 12/27/2021   Trochanteric bursitis of left hip 12/12/2021   Osteoarthritis of left hip 12/12/2021   Pain in joint of right hip 12/10/2021   Secondary hypercoagulable state (HCC) 01/30/2021   Elevated LFTs    Dilated cbd, acquired    Calculus of bile duct without cholangitis with obstruction    Unstable angina (HCC) 01/06/2021   Vitamin D deficiency 12/20/2020   Hypertension 03/28/2020   Shoulder pain, right 10/11/2019   S/P reverse total shoulder arthroplasty, 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   History of reverse  total replacement of right shoulder joint 09/16/2019   Osteoarthritis of right glenohumeral joint 08/20/2019   S/p reverse total shoulder arthroplasty 07/30/2018   Shoulder pain 05/04/2018   Stiffness of right knee 01/21/2018   Atrial fibrillation (HCC) 01/19/2018   Status post total knee replacement, right 01/16/2018   Osteoarthritis of right knee 12/15/2017   Hyperglycemia 12/04/2016   Lumbar disc disease 08/05/2011   Cervical disc disease 08/05/2011   Degeneration of lumbosacral intervertebral disc 08/05/2011   Preventative health care 08/02/2011   Hyperlipemia 01/08/2010   GOUT 01/08/2010   Anemia 01/08/2010   Anxiety state 01/08/2010   Allergic rhinitis 01/08/2010   DIVERTICULOSIS, COLON 01/08/2010   OSTEOARTHRITIS, KNEES, 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   CERVICAL RADICULOPATHY, RIGHT 01/08/2010   CHEST PAIN 01/08/2010   Past Medical History:  Diagnosis Date   ALLERGIC RHINITIS 01/08/2010   Allergy    dust mites, pet dander   Atrial fibrillation with RVR (HCC) 01/19/2018   CERVICAL RADICULOPATHY, RIGHT 01/08/2010   CHEST PAIN 01/08/2010   DIVERTICULOSIS, COLON 01/08/2010   Dysrhythmia    post op   during PT a fib RVR   GERD (gastroesophageal reflux disease)    Zantac OTC  prn   GOUT 01/08/2010   in past/ when has surgery   HYPERLIPIDEMIA 01/08/2010   Hypertension    Lumbar disc disease 08/05/2011   Osteoarthritis of right knee    end stage   OSTEOARTHRITIS, KNEES, 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    Past Surgical History:  Procedure Laterality Date   ANTERIOR CERVICAL DECOMP/DISCECTOMY FUSION  07/23/2012   Procedure: ANTERIOR CERVICAL DECOMPRESSION/DISCECTOMY FUSION 2 LEVELS;  Surgeon: Clydene Fake, MD;  Location: [facility]  NEURO ORS;  Service: Neurosurgery;  Laterality: N/A;  Cervical three-four, four-five, redo Cervical six-seven anterior cervical decompression, discectomy fusion with allograft and plating   BACK SURGERY  03/2004, 02/2005   CERVICAL SPINE SURGERY  05/2010   CHOLECYSTECTOMY  N/A 01/11/2021   Procedure: LAPAROSCOPIC CHOLECYSTECTOMY;  Surgeon: Axel Filler, MD;  Location: [facility] OR;  Service: General;  Laterality: N/A;   clavicle facture  nov. 2009   100% displacement   COLONOSCOPY     ERCP N/A 01/10/2021   Procedure: ENDOSCOPIC RETROGRADE CHOLANGIOPANCREATOGRAPHY (ERCP);  Surgeon: Meryl Dare, MD;  Location: [facility] ENDOSCOPY;  Service: Endoscopy;  Laterality: N/A;   HERNIA REPAIR     inguinal hernia/right side/ 72 years old   JOINT REPLACEMENT     Left knee 2006   KNEE ARTHROSCOPY     total of 5   left knee arthroscopy  september 2005   lumbar disc surgury  02/2002   lumbar disc surgury  june 2004   NASAL RECONSTRUCTION     x 3   REMOVAL OF STONES  01/10/2021   Procedure: REMOVAL OF STONES;  Surgeon: Meryl Dare, MD;  Location: [facility] ENDOSCOPY;  Service: Endoscopy;;   REVERSE SHOULDER ARTHROPLASTY Left 07/30/2018   Procedure: LEFT REVERSE SHOULDER ARTHROPLASTY;  Surgeon: Francena Hanly, MD;  Location: [facility] OR;  Service: Orthopedics;  Laterality: Left;    s/p ENT surgury     x 3   s/p left shoulder surgury  feb. 2009   s/p left TKR  april 2006   SPHINCTEROTOMY  01/10/2021   Procedure: SPHINCTEROTOMY;  Surgeon: Meryl Dare, MD;  Location: [facility] ENDOSCOPY;  Service: Endoscopy;;   TOTAL KNEE ARTHROPLASTY Right 01/16/2018   Procedure: RIGHT TOTAL KNEE ARTHROPLASTY;  Surgeon: Beverely Low, MD;  Location: [facility] OR;  Service: Orthopedics;  Laterality: Right;   TOTAL SHOULDER ARTHROPLASTY Right 09/16/2019   Procedure: Right Reverse shoulder arthroplasty;  Surgeon: Francena Hanly, MD;  Location: [facility] ORS;  Service: Orthopedics;  Laterality: Right;    Current Outpatient Medications  Medication Sig Dispense Refill Last Dose   atorvastatin (LIPITOR) 10 MG tablet TAKE 1 TABLET BY MOUTH EVERY DAY 90 tablet 3    Calcium Polycarbophil (FIBER-CAPS PO) Take 2 tablets by mouth in the morning.      carvedilol (COREG) 3.125 MG tablet Take 1 tablet (3.125 mg total) by mouth 2  (two) times daily. 180 tablet 3    Cholecalciferol (VITAMIN D3) 50 MCG (2000 UT) capsule Take 4,000 Units by mouth daily.      ELIQUIS 5 MG TABS tablet Take 1 tablet by mouth twice daily 180 tablet 0    fexofenadine (ALLEGRA) 180 MG tablet Take 180 mg by mouth in the morning and at bedtime.      fluticasone (FLONASE) 50 MCG/ACT nasal spray Place 2 sprays into the nose daily as needed for allergies or rhinitis. For allergies.      lisinopril (ZESTRIL) 10 MG tablet Take 1 tablet by mouth once daily 90 tablet 0    naproxen sodium (ALEVE) 220 MG tablet Take 440 mg by mouth 2 (two) times daily as needed (Pain).      No current facility-administered medications for this visit.   Allergies  Allergen Reactions   Dust Mite Extract Other (See Comments)    Nasal Congestion   Drug Ingredient [Bee Pollen] Other (See Comments)    Congestion   Other Other (See Comments)    Pet Dander- Runny nose    Social History  Tobacco Use   Smoking status: Never   Smokeless tobacco: Never  Substance Use Topics   Alcohol use: Yes    Alcohol/week: 12.0 standard drinks of alcohol    Types: 12 Cans of beer per week    Comment: occ beer    Family History  Problem Relation Age of Onset   Bradycardia Father 20       Pacemaker   Lymphoma Mother    Alzheimer's disease Mother    Pancreatic cancer Brother    Colon cancer Neg Hx      Review of Systems  Musculoskeletal:  Positive for arthralgias, back pain and gait problem.  All other systems reviewed and are negative.   Objective:  Physical Exam Constitutional:      Appearance: Normal appearance.  HENT:     Head: Normocephalic and atraumatic.     Nose: Nose normal.     Mouth/Throat:     Mouth: Mucous membranes are moist.     Pharynx: Oropharynx is clear.  Eyes:     Conjunctiva/sclera: Conjunctivae normal.  Cardiovascular:     Rate and Rhythm: Normal rate and regular rhythm.     Pulses: Normal pulses.     Heart sounds: Normal heart sounds.   Pulmonary:     Effort: Pulmonary effort is normal.     Breath sounds: Normal breath sounds.  Abdominal:     General: Abdomen is flat.     Palpations: Abdomen is soft.  Genitourinary:    Comments: deferred Musculoskeletal:     Cervical back: Normal range of motion and neck supple.     Comments: Examination of the right hip reveals no skin wounds or lesions. Pain with flexion and rotation of the hip. Mild trochanteric tenderness to palpation.   Sensory and motor function intact in LE bilaterally. Palpable pedal pulses.  No significant pedal edema. Calves soft and non-tender.   Skin:    General: Skin is warm and dry.     Capillary Refill: Capillary refill takes less than 2 seconds.  Neurological:     General: No focal deficit present.     Mental Status: He is alert and oriented to person, place, and time.  Psychiatric:        Mood and Affect: Mood normal.        Behavior: Behavior normal.        Thought Content: Thought content normal.        Judgment: Judgment normal.     Vital signs in last 24 hours: @VSRANGES @  Labs:   Estimated body mass index is 30.71 kg/m as calculated from the following:   Height as of 04/23/23: 5\' 10"  (1.778 m).   Weight as of 04/23/23: 97.1 kg.   Imaging Review Plain radiographs demonstrate severe degenerative joint disease of the right hip(s). The bone quality appears to be adequate for age and reported activity level.      Assessment/Plan:  End stage arthritis, right hip(s)  The patient history, physical examination, clinical judgement of the provider and imaging studies are consistent with end stage degenerative joint disease of the right hip(s) and total hip arthroplasty is deemed medically necessary. The treatment options including medical management, injection therapy, arthroscopy and arthroplasty were discussed at length. The risks and benefits of total hip arthroplasty were presented and reviewed. The risks due to aseptic loosening,  infection, stiffness, dislocation/subluxation,  thromboembolic complications and other imponderables were discussed.  The patient acknowledged the explanation, agreed to proceed with the plan and consent was  signed. Patient is being admitted for inpatient treatment for surgery, pain control, PT, OT, prophylactic antibiotics, VTE prophylaxis, progressive ambulation and ADL's and discharge planning.The patient is planning to be discharged home with HEP after an overnight stay.   Therapy Plans: HEP. Disposition: Home with wife Planned DVT Prophylaxis: Eliquis 5mg  BID DME needed: walker.  PCP: Cleared.  Cardiology: Cleared. Hold Eliquis 3 days prior to surgery.  TXA: IV Allergies:  - Animal dander and mites. - NDKA.  Anesthesia Concerns: None.  BMI: 31.9  Last HgbA1c: 5.8 Other: - A-fib, chronic eliquis. - History of lumbar surgery.  - Hydrocodone, zofran.  - NO NSAIDs.  - 04/23/23: Hgb 15.0, K+ 4.5, Cr. 1.05.     Patient's anticipated LOS is less than 2 midnights, meeting these requirements: - Younger than 46 - Lives within 1 hour of care - Has a competent adult at home to recover with post-op recover - NO history of  - Chronic pain requiring opiods  - Diabetes  - Coronary Artery Disease  - Heart failure  - Heart attack  - Stroke  - DVT/VTE  - Cardiac arrhythmia  - Respiratory Failure/COPD  - Renal failure  - Anemia  - Advanced Liver disease

## 2023-04-29 NOTE — H&P (Cosign Needed Addendum)
TOTAL HIP ADMISSION H&P  Patient is admitted for right total hip arthroplasty.  Subjective:  Chief Complaint: right hip pain  HPI: Todd K Moldovan Jr., 71 y.o. male, has a history of pain and functional disability in the right hip(s) due to arthritis and patient has failed non-surgical conservative treatments for greater than 12 weeks to include NSAID's and/or analgesics, flexibility and strengthening excercises, use of assistive devices, and activity modification.  Onset of symptoms was gradual starting 10 years ago with rapidlly worsening course since that time.The patient noted no past surgery on the right hip(s).  Patient currently rates pain in the right hip at 10 out of 10 with activity. Patient has night pain, worsening of pain with activity and weight bearing, trendelenberg gait, pain that interfers with activities of daily living, and pain with passive range of motion. Patient has evidence of subchondral cysts, subchondral sclerosis, periarticular osteophytes, and joint space narrowing by imaging studies. This condition presents safety issues increasing the risk of falls.  There is no current active infection.  Patient Active Problem List   Diagnosis Date Noted   Elevated coronary artery calcium score 03/05/2023   Chronic right-sided low back pain without sciatica 12/24/2022   Contractures involving both knees 12/24/2022   Chronic anticoagulation 12/31/2021   Pain of left hip joint 12/27/2021   Trochanteric bursitis of left hip 12/12/2021   Osteoarthritis of left hip 12/12/2021   Pain in joint of right hip 12/10/2021   Secondary hypercoagulable state (HCC) 01/30/2021   Elevated LFTs    Dilated cbd, acquired    Calculus of bile duct without cholangitis with obstruction    Unstable angina (HCC) 01/06/2021   Vitamin D deficiency 12/20/2020   Hypertension 03/28/2020   Shoulder pain, right 10/11/2019   S/P reverse total shoulder arthroplasty, 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   History of reverse  total replacement of right shoulder joint 09/16/2019   Osteoarthritis of right glenohumeral joint 08/20/2019   S/p reverse total shoulder arthroplasty 07/30/2018   Shoulder pain 05/04/2018   Stiffness of right knee 01/21/2018   Atrial fibrillation (HCC) 01/19/2018   Status post total knee replacement, right 01/16/2018   Osteoarthritis of right knee 12/15/2017   Hyperglycemia 12/04/2016   Lumbar disc disease 08/05/2011   Cervical disc disease 08/05/2011   Degeneration of lumbosacral intervertebral disc 08/05/2011   Preventative health care 08/02/2011   Hyperlipemia 01/08/2010   GOUT 01/08/2010   Anemia 01/08/2010   Anxiety state 01/08/2010   Allergic rhinitis 01/08/2010   DIVERTICULOSIS, COLON 01/08/2010   OSTEOARTHRITIS, KNEES, 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   CERVICAL RADICULOPATHY, RIGHT 01/08/2010   CHEST PAIN 01/08/2010   Past Medical History:  Diagnosis Date   ALLERGIC RHINITIS 01/08/2010   Allergy    dust mites, pet dander   Atrial fibrillation with RVR (HCC) 01/19/2018   CERVICAL RADICULOPATHY, RIGHT 01/08/2010   CHEST PAIN 01/08/2010   DIVERTICULOSIS, COLON 01/08/2010   Dysrhythmia    post op   during PT a fib RVR   GERD (gastroesophageal reflux disease)    Zantac OTC  prn   GOUT 01/08/2010   in past/ when has surgery   HYPERLIPIDEMIA 01/08/2010   Hypertension    Lumbar disc disease 08/05/2011   Osteoarthritis of right knee    end stage   OSTEOARTHRITIS, KNEES, 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    Past Surgical History:  Procedure Laterality Date   ANTERIOR CERVICAL DECOMP/DISCECTOMY FUSION  07/23/2012   Procedure: ANTERIOR CERVICAL DECOMPRESSION/DISCECTOMY FUSION 2 LEVELS;  Surgeon: James R Hirsch, MD;  Location: [facility]   NEURO ORS;  Service: Neurosurgery;  Laterality: N/A;  Cervical three-four, four-five, redo Cervical six-seven anterior cervical decompression, discectomy fusion with allograft and plating   BACK SURGERY  03/2004, 02/2005   CERVICAL SPINE SURGERY  05/2010   CHOLECYSTECTOMY  N/A 01/11/2021   Procedure: LAPAROSCOPIC CHOLECYSTECTOMY;  Surgeon: Ramirez, Armando, MD;  Location: [facility] OR;  Service: General;  Laterality: N/A;   clavicle facture  nov. 2009   100% displacement   COLONOSCOPY     ERCP N/A 01/10/2021   Procedure: ENDOSCOPIC RETROGRADE CHOLANGIOPANCREATOGRAPHY (ERCP);  Surgeon: Stark, Malcolm T, MD;  Location: [facility] ENDOSCOPY;  Service: Endoscopy;  Laterality: N/A;   HERNIA REPAIR     inguinal hernia/right side/ 72 years old   JOINT REPLACEMENT     Left knee 2006   KNEE ARTHROSCOPY     total of 5   left knee arthroscopy  september 2005   lumbar disc surgury  02/2002   lumbar disc surgury  june 2004   NASAL RECONSTRUCTION     x 3   REMOVAL OF STONES  01/10/2021   Procedure: REMOVAL OF STONES;  Surgeon: Stark, Malcolm T, MD;  Location: [facility] ENDOSCOPY;  Service: Endoscopy;;   REVERSE SHOULDER ARTHROPLASTY Left 07/30/2018   Procedure: LEFT REVERSE SHOULDER ARTHROPLASTY;  Surgeon: Supple, Kevin, MD;  Location: [facility] OR;  Service: Orthopedics;  Laterality: Left;  120min   s/p ENT surgury     x 3   s/p left shoulder surgury  feb. 2009   s/p left TKR  april 2006   SPHINCTEROTOMY  01/10/2021   Procedure: SPHINCTEROTOMY;  Surgeon: Stark, Malcolm T, MD;  Location: [facility] ENDOSCOPY;  Service: Endoscopy;;   TOTAL KNEE ARTHROPLASTY Right 01/16/2018   Procedure: RIGHT TOTAL KNEE ARTHROPLASTY;  Surgeon: Norris, Steve, MD;  Location: [facility] OR;  Service: Orthopedics;  Laterality: Right;   TOTAL SHOULDER ARTHROPLASTY Right 09/16/2019   Procedure: Right Reverse shoulder arthroplasty;  Surgeon: Supple, Kevin, MD;  Location: [facility] ORS;  Service: Orthopedics;  Laterality: Right;    Current Outpatient Medications  Medication Sig Dispense Refill Last Dose   atorvastatin (LIPITOR) 10 MG tablet TAKE 1 TABLET BY MOUTH EVERY DAY 90 tablet 3    Calcium Polycarbophil (FIBER-CAPS PO) Take 2 tablets by mouth in the morning.      carvedilol (COREG) 3.125 MG tablet Take 1 tablet (3.125 mg total) by mouth 2  (two) times daily. 180 tablet 3    Cholecalciferol (VITAMIN D3) 50 MCG (2000 UT) capsule Take 4,000 Units by mouth daily.      ELIQUIS 5 MG TABS tablet Take 1 tablet by mouth twice daily 180 tablet 0    fexofenadine (ALLEGRA) 180 MG tablet Take 180 mg by mouth in the morning and at bedtime.      fluticasone (FLONASE) 50 MCG/ACT nasal spray Place 2 sprays into the nose daily as needed for allergies or rhinitis. For allergies.      lisinopril (ZESTRIL) 10 MG tablet Take 1 tablet by mouth once daily 90 tablet 0    naproxen sodium (ALEVE) 220 MG tablet Take 440 mg by mouth 2 (two) times daily as needed (Pain).      No current facility-administered medications for this visit.   Allergies  Allergen Reactions   Dust Mite Extract Other (See Comments)    Nasal Congestion   Drug Ingredient [Bee Pollen] Other (See Comments)    Congestion   Other Other (See Comments)    Pet Dander- Runny nose    Social History     Tobacco Use   Smoking status: Never   Smokeless tobacco: Never  Substance Use Topics   Alcohol use: Yes    Alcohol/week: 12.0 standard drinks of alcohol    Types: 12 Cans of beer per week    Comment: occ beer    Family History  Problem Relation Age of Onset   Bradycardia Father 60       Pacemaker   Lymphoma Mother    Alzheimer's disease Mother    Pancreatic cancer Brother    Colon cancer Neg Hx      Review of Systems  Musculoskeletal:  Positive for arthralgias, back pain and gait problem.  All other systems reviewed and are negative.   Objective:  Physical Exam Constitutional:      Appearance: Normal appearance.  HENT:     Head: Normocephalic and atraumatic.     Nose: Nose normal.     Mouth/Throat:     Mouth: Mucous membranes are moist.     Pharynx: Oropharynx is clear.  Eyes:     Conjunctiva/sclera: Conjunctivae normal.  Cardiovascular:     Rate and Rhythm: Normal rate and regular rhythm.     Pulses: Normal pulses.     Heart sounds: Normal heart sounds.   Pulmonary:     Effort: Pulmonary effort is normal.     Breath sounds: Normal breath sounds.  Abdominal:     General: Abdomen is flat.     Palpations: Abdomen is soft.  Genitourinary:    Comments: deferred Musculoskeletal:     Cervical back: Normal range of motion and neck supple.     Comments: Examination of the right hip reveals no skin wounds or lesions. Pain with flexion and rotation of the hip. Mild trochanteric tenderness to palpation.   Sensory and motor function intact in LE bilaterally. Palpable pedal pulses.  No significant pedal edema. Calves soft and non-tender.   Skin:    General: Skin is warm and dry.     Capillary Refill: Capillary refill takes less than 2 seconds.  Neurological:     General: No focal deficit present.     Mental Status: He is alert and oriented to person, place, and time.  Psychiatric:        Mood and Affect: Mood normal.        Behavior: Behavior normal.        Thought Content: Thought content normal.        Judgment: Judgment normal.     Vital signs in last 24 hours: @VSRANGES@  Labs:   Estimated body mass index is 30.71 kg/m as calculated from the following:   Height as of 04/23/23: 5' 10" (1.778 m).   Weight as of 04/23/23: 97.1 kg.   Imaging Review Plain radiographs demonstrate severe degenerative joint disease of the right hip(s). The bone quality appears to be adequate for age and reported activity level.      Assessment/Plan:  End stage arthritis, right hip(s)  The patient history, physical examination, clinical judgement of the provider and imaging studies are consistent with end stage degenerative joint disease of the right hip(s) and total hip arthroplasty is deemed medically necessary. The treatment options including medical management, injection therapy, arthroscopy and arthroplasty were discussed at length. The risks and benefits of total hip arthroplasty were presented and reviewed. The risks due to aseptic loosening,  infection, stiffness, dislocation/subluxation,  thromboembolic complications and other imponderables were discussed.  The patient acknowledged the explanation, agreed to proceed with the plan and consent was   signed. Patient is being admitted for inpatient treatment for surgery, pain control, PT, OT, prophylactic antibiotics, VTE prophylaxis, progressive ambulation and ADL's and discharge planning.The patient is planning to be discharged home with HEP after an overnight stay.   Therapy Plans: HEP. Disposition: Home with wife Planned DVT Prophylaxis: Eliquis 5mg BID DME needed: walker.  PCP: Cleared.  Cardiology: Cleared. Hold Eliquis 3 days prior to surgery.  TXA: IV Allergies:  - Animal dander and mites. - NDKA.  Anesthesia Concerns: None.  BMI: 31.9  Last HgbA1c: 5.8 Other: - A-fib, chronic eliquis. - History of lumbar surgery.  - Hydrocodone, zofran.  - NO NSAIDs.  - 04/23/23: Hgb 15.0, K+ 4.5, Cr. 1.05.     Patient's anticipated LOS is less than 2 midnights, meeting these requirements: - Younger than 65 - Lives within 1 hour of care - Has a competent adult at home to recover with post-op recover - NO history of  - Chronic pain requiring opiods  - Diabetes  - Coronary Artery Disease  - Heart failure  - Heart attack  - Stroke  - DVT/VTE  - Cardiac arrhythmia  - Respiratory Failure/COPD  - Renal failure  - Anemia  - Advanced Liver disease   

## 2023-05-01 ENCOUNTER — Ambulatory Visit (HOSPITAL_BASED_OUTPATIENT_CLINIC_OR_DEPARTMENT_OTHER): Payer: Medicare Other | Admitting: Anesthesiology

## 2023-05-01 ENCOUNTER — Ambulatory Visit (HOSPITAL_COMMUNITY): Payer: Medicare Other

## 2023-05-01 ENCOUNTER — Observation Stay (HOSPITAL_COMMUNITY): Payer: Medicare Other

## 2023-05-01 ENCOUNTER — Other Ambulatory Visit: Payer: Self-pay

## 2023-05-01 ENCOUNTER — Ambulatory Visit (HOSPITAL_COMMUNITY): Payer: Medicare Other | Admitting: Medical

## 2023-05-01 ENCOUNTER — Encounter (HOSPITAL_COMMUNITY): Payer: Self-pay | Admitting: Orthopedic Surgery

## 2023-05-01 ENCOUNTER — Encounter (HOSPITAL_COMMUNITY)
Admission: RE | Disposition: A | Discharge status: Home / Self Care | Payer: Self-pay | Source: Ambulatory Visit | Attending: Orthopedic Surgery

## 2023-05-01 ENCOUNTER — Observation Stay (HOSPITAL_COMMUNITY)
Admission: RE | Admit: 2023-05-01 | Discharge: 2023-05-02 | Disposition: A | Discharge status: Home / Self Care | Payer: Medicare Other | Source: Ambulatory Visit | Attending: Orthopedic Surgery | Admitting: Orthopedic Surgery

## 2023-05-01 DIAGNOSIS — Z471 Aftercare following joint replacement surgery: Secondary | ICD-10-CM | POA: Diagnosis not present

## 2023-05-01 DIAGNOSIS — I4891 Unspecified atrial fibrillation: Secondary | ICD-10-CM | POA: Insufficient documentation

## 2023-05-01 DIAGNOSIS — I10 Essential (primary) hypertension: Secondary | ICD-10-CM | POA: Insufficient documentation

## 2023-05-01 DIAGNOSIS — Z96653 Presence of artificial knee joint, bilateral: Secondary | ICD-10-CM | POA: Insufficient documentation

## 2023-05-01 DIAGNOSIS — Z7901 Long term (current) use of anticoagulants: Secondary | ICD-10-CM | POA: Insufficient documentation

## 2023-05-01 DIAGNOSIS — Z01818 Encounter for other preprocedural examination: Secondary | ICD-10-CM

## 2023-05-01 DIAGNOSIS — I2510 Atherosclerotic heart disease of native coronary artery without angina pectoris: Secondary | ICD-10-CM

## 2023-05-01 DIAGNOSIS — Z96611 Presence of right artificial shoulder joint: Secondary | ICD-10-CM | POA: Insufficient documentation

## 2023-05-01 DIAGNOSIS — M1611 Unilateral primary osteoarthritis, right hip: Principal | ICD-10-CM | POA: Insufficient documentation

## 2023-05-01 DIAGNOSIS — Z96641 Presence of right artificial hip joint: Secondary | ICD-10-CM | POA: Diagnosis present

## 2023-05-01 DIAGNOSIS — Z96612 Presence of left artificial shoulder joint: Secondary | ICD-10-CM | POA: Diagnosis not present

## 2023-05-01 DIAGNOSIS — Z79899 Other long term (current) drug therapy: Secondary | ICD-10-CM | POA: Insufficient documentation

## 2023-05-01 HISTORY — PX: TOTAL HIP ARTHROPLASTY: SHX124

## 2023-05-01 SURGERY — ARTHROPLASTY, HIP, TOTAL, ANTERIOR APPROACH
Anesthesia: Monitor Anesthesia Care | Site: Hip | Laterality: Right

## 2023-05-01 MED ORDER — DEXMEDETOMIDINE HCL IN NACL 80 MCG/20ML IV SOLN
INTRAVENOUS | Status: DC | PRN
  Administered 2023-05-01: 12 ug via INTRAVENOUS

## 2023-05-01 MED ORDER — APIXABAN 2.5 MG PO TABS
2.5000 mg | ORAL_TABLET | Freq: Two times a day (BID) | ORAL | Status: DC
  Administered 2023-05-02: 2.5 mg via ORAL
  Filled 2023-05-01: qty 1

## 2023-05-01 MED ORDER — PANTOPRAZOLE SODIUM 40 MG PO TBEC
40.0000 mg | DELAYED_RELEASE_TABLET | Freq: Every day | ORAL | Status: DC
  Administered 2023-05-01 – 2023-05-02 (×2): 40 mg via ORAL
  Filled 2023-05-01 (×2): qty 1

## 2023-05-01 MED ORDER — METOCLOPRAMIDE HCL 5 MG PO TABS: 5.0000 mg | ORAL_TABLET | Freq: Three times a day (TID) | ORAL | Status: DC | PRN

## 2023-05-01 MED ORDER — ORAL CARE MOUTH RINSE: 15.0000 mL | OROMUCOSAL | Status: DC | PRN

## 2023-05-01 MED ORDER — PROPOFOL 10 MG/ML IV BOLUS
INTRAVENOUS | Status: AC
  Filled 2023-05-01: qty 20

## 2023-05-01 MED ORDER — LACTATED RINGERS IV SOLN: INTRAVENOUS | Status: DC

## 2023-05-01 MED ORDER — ORAL CARE MOUTH RINSE: 15.0000 mL | Freq: Once | OROMUCOSAL | Status: AC

## 2023-05-01 MED ORDER — ISOPROPYL ALCOHOL 70 % SOLN
Status: DC | PRN
  Administered 2023-05-01: 1 via TOPICAL

## 2023-05-01 MED ORDER — LORATADINE 10 MG PO TABS
10.0000 mg | ORAL_TABLET | Freq: Every day | ORAL | Status: DC
  Administered 2023-05-01 – 2023-05-02 (×2): 10 mg via ORAL
  Filled 2023-05-01 (×2): qty 1

## 2023-05-01 MED ORDER — POLYETHYLENE GLYCOL 3350 17 G PO PACK: 17.0000 g | PACK | Freq: Every day | ORAL | Status: DC | PRN

## 2023-05-01 MED ORDER — HYDROCODONE-ACETAMINOPHEN 7.5-325 MG PO TABS: 1.0000 | ORAL_TABLET | ORAL | Status: DC | PRN

## 2023-05-01 MED ORDER — PHENOL 1.4 % MT LIQD: 1.0000 | OROMUCOSAL | Status: DC | PRN

## 2023-05-01 MED ORDER — VANCOMYCIN HCL 1000 MG IV SOLR
INTRAVENOUS | Status: DC | PRN
  Administered 2023-05-01: 1000 mg via TOPICAL

## 2023-05-01 MED ORDER — HYDROCODONE-ACETAMINOPHEN 5-325 MG PO TABS
1.0000 | ORAL_TABLET | ORAL | Status: DC | PRN
  Administered 2023-05-01 – 2023-05-02 (×2): 2 via ORAL
  Filled 2023-05-01 (×2): qty 2

## 2023-05-01 MED ORDER — VANCOMYCIN HCL 1000 MG IV SOLR
INTRAVENOUS | Status: AC
  Filled 2023-05-01: qty 20

## 2023-05-01 MED ORDER — FENTANYL CITRATE PF 50 MCG/ML IJ SOSY: 25.0000 ug | PREFILLED_SYRINGE | INTRAMUSCULAR | Status: DC | PRN

## 2023-05-01 MED ORDER — EPINEPHRINE PF 1 MG/ML IJ SOLN
INTRAMUSCULAR | Status: AC
  Filled 2023-05-01: qty 1

## 2023-05-01 MED ORDER — FENTANYL CITRATE (PF) 100 MCG/2ML IJ SOLN
INTRAMUSCULAR | Status: AC
  Filled 2023-05-01: qty 2

## 2023-05-01 MED ORDER — CEFAZOLIN SODIUM-DEXTROSE 2-4 GM/100ML-% IV SOLN
2.0000 g | Freq: Four times a day (QID) | INTRAVENOUS | Status: AC
  Administered 2023-05-01 – 2023-05-02 (×2): 2 g via INTRAVENOUS
  Filled 2023-05-01 (×2): qty 100

## 2023-05-01 MED ORDER — OXYCODONE HCL 5 MG/5ML PO SOLN: 5.0000 mg | Freq: Once | ORAL | Status: DC | PRN

## 2023-05-01 MED ORDER — ACETAMINOPHEN 160 MG/5ML PO SOLN: 1000.0000 mg | Freq: Once | ORAL | Status: DC | PRN

## 2023-05-01 MED ORDER — SODIUM CHLORIDE 0.9 % IV SOLN: INTRAVENOUS | Status: DC

## 2023-05-01 MED ORDER — SODIUM CHLORIDE (PF) 0.9 % IJ SOLN
INTRAMUSCULAR | Status: AC
  Filled 2023-05-01: qty 50

## 2023-05-01 MED ORDER — POVIDONE-IODINE 10 % EX SWAB: 2.0000 | Freq: Once | CUTANEOUS | Status: DC

## 2023-05-01 MED ORDER — METOCLOPRAMIDE HCL 5 MG/ML IJ SOLN: 5.0000 mg | Freq: Three times a day (TID) | INTRAMUSCULAR | Status: DC | PRN

## 2023-05-01 MED ORDER — SENNA 8.6 MG PO TABS
1.0000 | ORAL_TABLET | Freq: Two times a day (BID) | ORAL | Status: DC
  Administered 2023-05-01 – 2023-05-02 (×2): 8.6 mg via ORAL
  Filled 2023-05-01 (×2): qty 1

## 2023-05-01 MED ORDER — ONDANSETRON HCL 4 MG/2ML IJ SOLN: 4.0000 mg | Freq: Four times a day (QID) | INTRAMUSCULAR | Status: DC | PRN

## 2023-05-01 MED ORDER — CARVEDILOL 3.125 MG PO TABS
3.1250 mg | ORAL_TABLET | Freq: Two times a day (BID) | ORAL | Status: DC
  Administered 2023-05-01 – 2023-05-02 (×2): 3.125 mg via ORAL
  Filled 2023-05-01 (×2): qty 1

## 2023-05-01 MED ORDER — PHENYLEPHRINE HCL (PRESSORS) 10 MG/ML IV SOLN
INTRAVENOUS | Status: AC
  Filled 2023-05-01: qty 1

## 2023-05-01 MED ORDER — SODIUM CHLORIDE (PF) 0.9 % IJ SOLN
INTRAMUSCULAR | Status: DC | PRN
  Administered 2023-05-01: 30 mL

## 2023-05-01 MED ORDER — MENTHOL 3 MG MT LOZG: 1.0000 | LOZENGE | OROMUCOSAL | Status: DC | PRN

## 2023-05-01 MED ORDER — METHOCARBAMOL 1000 MG/10ML IJ SOLN: 500.0000 mg | Freq: Four times a day (QID) | INTRAVENOUS | Status: DC | PRN

## 2023-05-01 MED ORDER — OXYCODONE HCL 5 MG PO TABS: 5.0000 mg | ORAL_TABLET | Freq: Once | ORAL | Status: DC | PRN

## 2023-05-01 MED ORDER — TRANEXAMIC ACID-NACL 1000-0.7 MG/100ML-% IV SOLN
1000.0000 mg | INTRAVENOUS | Status: AC
  Administered 2023-05-01: 1000 mg via INTRAVENOUS
  Filled 2023-05-01: qty 100

## 2023-05-01 MED ORDER — ONDANSETRON HCL 4 MG PO TABS: 4.0000 mg | ORAL_TABLET | Freq: Four times a day (QID) | ORAL | Status: DC | PRN

## 2023-05-01 MED ORDER — FENTANYL CITRATE (PF) 100 MCG/2ML IJ SOLN
INTRAMUSCULAR | Status: DC | PRN
  Administered 2023-05-01: 100 ug via INTRAVENOUS

## 2023-05-01 MED ORDER — CHLORHEXIDINE GLUCONATE 0.12 % MT SOLN
15.0000 mL | Freq: Once | OROMUCOSAL | Status: AC
  Administered 2023-05-01: 15 mL via OROMUCOSAL

## 2023-05-01 MED ORDER — KETOROLAC TROMETHAMINE 30 MG/ML IJ SOLN
INTRAMUSCULAR | Status: AC
  Filled 2023-05-01: qty 1

## 2023-05-01 MED ORDER — WATER FOR IRRIGATION, STERILE IR SOLN
Status: DC | PRN
  Administered 2023-05-01: 2000 mL

## 2023-05-01 MED ORDER — ACETAMINOPHEN 500 MG PO TABS
500.0000 mg | ORAL_TABLET | Freq: Four times a day (QID) | ORAL | Status: DC
  Administered 2023-05-02: 500 mg via ORAL
  Filled 2023-05-01 (×3): qty 1

## 2023-05-01 MED ORDER — ACETAMINOPHEN 500 MG PO TABS: 1000.0000 mg | ORAL_TABLET | Freq: Once | ORAL | Status: DC | PRN

## 2023-05-01 MED ORDER — ACETAMINOPHEN 500 MG PO TABS
1000.0000 mg | ORAL_TABLET | Freq: Once | ORAL | Status: AC
  Administered 2023-05-01: 1000 mg via ORAL
  Filled 2023-05-01: qty 2

## 2023-05-01 MED ORDER — FLUTICASONE PROPIONATE 50 MCG/ACT NA SUSP: 1.0000 | Freq: Every day | NASAL | Status: DC | PRN

## 2023-05-01 MED ORDER — BISACODYL 10 MG RE SUPP: 10.0000 mg | Freq: Every day | RECTAL | Status: DC | PRN

## 2023-05-01 MED ORDER — KETOROLAC TROMETHAMINE 30 MG/ML IJ SOLN
INTRAMUSCULAR | Status: DC | PRN
  Administered 2023-05-01: 30 mg

## 2023-05-01 MED ORDER — CEFAZOLIN SODIUM-DEXTROSE 2-4 GM/100ML-% IV SOLN
2.0000 g | INTRAVENOUS | Status: AC
  Administered 2023-05-01: 2 g via INTRAVENOUS
  Filled 2023-05-01: qty 100

## 2023-05-01 MED ORDER — METHOCARBAMOL 500 MG PO TABS
500.0000 mg | ORAL_TABLET | Freq: Four times a day (QID) | ORAL | Status: DC | PRN
  Administered 2023-05-01 – 2023-05-02 (×2): 500 mg via ORAL
  Filled 2023-05-01: qty 1

## 2023-05-01 MED ORDER — ACETAMINOPHEN 10 MG/ML IV SOLN: 1000.0000 mg | Freq: Once | INTRAVENOUS | Status: DC | PRN

## 2023-05-01 MED ORDER — BUPIVACAINE-EPINEPHRINE 0.25% -1:200000 IJ SOLN
INTRAMUSCULAR | Status: DC | PRN
  Administered 2023-05-01: 30 mL

## 2023-05-01 MED ORDER — MORPHINE SULFATE (PF) 2 MG/ML IV SOLN: 0.5000 mg | INTRAVENOUS | Status: DC | PRN

## 2023-05-01 MED ORDER — ATORVASTATIN CALCIUM 10 MG PO TABS
10.0000 mg | ORAL_TABLET | Freq: Every day | ORAL | Status: DC
  Administered 2023-05-01 – 2023-05-02 (×2): 10 mg via ORAL
  Filled 2023-05-01 (×2): qty 1

## 2023-05-01 MED ORDER — ACETAMINOPHEN 325 MG PO TABS: 325.0000 mg | ORAL_TABLET | Freq: Four times a day (QID) | ORAL | Status: DC | PRN

## 2023-05-01 MED ORDER — DIPHENHYDRAMINE HCL 12.5 MG/5ML PO ELIX: 12.5000 mg | ORAL_SOLUTION | ORAL | Status: DC | PRN

## 2023-05-01 MED ORDER — ALUM & MAG HYDROXIDE-SIMETH 200-200-20 MG/5ML PO SUSP: 30.0000 mL | ORAL | Status: DC | PRN

## 2023-05-01 MED ORDER — BUPIVACAINE HCL 0.25 % IJ SOLN
INTRAMUSCULAR | Status: AC
  Filled 2023-05-01: qty 1

## 2023-05-01 MED ORDER — SODIUM CHLORIDE 0.9 % IR SOLN
Status: DC | PRN
  Administered 2023-05-01: 4000 mL

## 2023-05-01 MED ORDER — PHENYLEPHRINE HCL-NACL 20-0.9 MG/250ML-% IV SOLN
INTRAVENOUS | Status: DC | PRN
  Administered 2023-05-01: 50 ug/min via INTRAVENOUS

## 2023-05-01 MED ORDER — DOCUSATE SODIUM 100 MG PO CAPS
100.0000 mg | ORAL_CAPSULE | Freq: Two times a day (BID) | ORAL | Status: DC
  Administered 2023-05-01 – 2023-05-02 (×2): 100 mg via ORAL
  Filled 2023-05-01 (×2): qty 1

## 2023-05-01 MED ORDER — PROPOFOL 500 MG/50ML IV EMUL
INTRAVENOUS | Status: AC
  Filled 2023-05-01: qty 50

## 2023-05-01 MED ORDER — DEXAMETHASONE SODIUM PHOSPHATE 4 MG/ML IJ SOLN
INTRAMUSCULAR | Status: DC | PRN
  Administered 2023-05-01: 8 mg via INTRAVENOUS

## 2023-05-01 MED ORDER — ONDANSETRON HCL 4 MG/2ML IJ SOLN
INTRAMUSCULAR | Status: DC | PRN
  Administered 2023-05-01: 4 mg via INTRAVENOUS

## 2023-05-01 SURGICAL SUPPLY — 59 items
ADH SKN CLS APL DERMABOND .7 (GAUZE/BANDAGES/DRESSINGS) ×1
APL PRP STRL LF DISP 70% ISPRP (MISCELLANEOUS) ×1
BAG COUNTER SPONGE SURGICOUNT (BAG) IMPLANT
BAG DECANTER FOR FLEXI CONT (MISCELLANEOUS) IMPLANT
BAG SPEC THK2 15X12 ZIP CLS (MISCELLANEOUS)
BAG SPNG CNTER NS LX DISP (BAG)
BAG ZIPLOCK 12X15 (MISCELLANEOUS) IMPLANT
CHLORAPREP W/TINT 26 (MISCELLANEOUS) ×1 IMPLANT
COVER PERINEAL POST (MISCELLANEOUS) ×1 IMPLANT
COVER SURGICAL LIGHT HANDLE (MISCELLANEOUS) ×1 IMPLANT
DERMABOND ADVANCED .7 DNX12 (GAUZE/BANDAGES/DRESSINGS) ×2 IMPLANT
DRAPE IMP U-DRAPE 54X76 (DRAPES) ×1 IMPLANT
DRAPE SHEET LG 3/4 BI-LAMINATE (DRAPES) ×3 IMPLANT
DRAPE STERI IOBAN 125X83 (DRAPES) ×1 IMPLANT
DRAPE U-SHAPE 47X51 STRL (DRAPES) ×1 IMPLANT
DRESSING AQUACEL AG SP 3.5X10 (GAUZE/BANDAGES/DRESSINGS) IMPLANT
DRSG AQUACEL AG ADV 3.5X10 (GAUZE/BANDAGES/DRESSINGS) ×1 IMPLANT
DRSG AQUACEL AG SP 3.5X10 (GAUZE/BANDAGES/DRESSINGS) ×1
ELECT REM PT RETURN 15FT ADLT (MISCELLANEOUS) ×1 IMPLANT
G7 VIT E NTRL LNR 36 SZG (Miscellaneous) IMPLANT
GAUZE SPONGE 4X4 12PLY STRL (GAUZE/BANDAGES/DRESSINGS) ×1 IMPLANT
GLOVE BIO SURGEON STRL SZ7 (GLOVE) ×1 IMPLANT
GLOVE BIO SURGEON STRL SZ8.5 (GLOVE) ×2 IMPLANT
GLOVE BIOGEL PI IND STRL 7.5 (GLOVE) ×1 IMPLANT
GLOVE BIOGEL PI IND STRL 8.5 (GLOVE) ×1 IMPLANT
GOWN SPEC L3 XXLG W/TWL (GOWN DISPOSABLE) ×1 IMPLANT
GOWN STRL REUS W/ TWL XL LVL3 (GOWN DISPOSABLE) ×1 IMPLANT
GOWN STRL REUS W/TWL XL LVL3 (GOWN DISPOSABLE) ×1
HANDPIECE INTERPULSE COAX TIP (DISPOSABLE) ×1
HEAD CERAMIC BIOLOX 36MM (Head) IMPLANT
HIP SLEEVE BIOLOX -6MM OFFSET (Sleeve) ×1 IMPLANT
HOLDER FOLEY CATH W/STRAP (MISCELLANEOUS) ×1 IMPLANT
HOOD PEEL AWAY T7 (MISCELLANEOUS) ×3 IMPLANT
KIT TURNOVER KIT A (KITS) IMPLANT
MANIFOLD NEPTUNE II (INSTRUMENTS) ×1 IMPLANT
MARKER SKIN DUAL TIP RULER LAB (MISCELLANEOUS) ×1 IMPLANT
NDL SAFETY ECLIP 18X1.5 (MISCELLANEOUS) ×1 IMPLANT
NDL SPNL 18GX3.5 QUINCKE PK (NEEDLE) ×1 IMPLANT
NEEDLE SPNL 18GX3.5 QUINCKE PK (NEEDLE) ×1 IMPLANT
PACK ANTERIOR HIP CUSTOM (KITS) ×1 IMPLANT
SAW OSC TIP CART 19.5X105X1.3 (SAW) ×1 IMPLANT
SEALER BIPOLAR AQUA 6.0 (INSTRUMENTS) ×1 IMPLANT
SET HNDPC FAN SPRY TIP SCT (DISPOSABLE) ×1 IMPLANT
SHELL ACET G7 4H 60 SZG (Shell) IMPLANT
SLEEVE HIP BIOLOX -6MM OFFSET (Sleeve) IMPLANT
SOLUTION PRONTOSAN WOUND 350ML (IRRIGATION / IRRIGATOR) ×1 IMPLANT
SPIKE FLUID TRANSFER (MISCELLANEOUS) ×1 IMPLANT
SUT MNCRL AB 3-0 PS2 18 (SUTURE) ×1 IMPLANT
SUT MON AB 2-0 CT1 36 (SUTURE) ×1 IMPLANT
SUT STRATAFIX PDO 1 14 VIOLET (SUTURE) ×1
SUT STRATFX PDO 1 14 VIOLET (SUTURE) ×1
SUT VIC AB 2-0 CT1 27 (SUTURE)
SUT VIC AB 2-0 CT1 TAPERPNT 27 (SUTURE) IMPLANT
SUTURE STRATFX PDO 1 14 VIOLET (SUTURE) ×1 IMPLANT
SYR 3ML LL SCALE MARK (SYRINGE) ×1 IMPLANT
TAPERLOC MCRO PRMY FMRL 16X117 (Joint) IMPLANT
TRAY FOLEY MTR SLVR 16FR STAT (SET/KITS/TRAYS/PACK) IMPLANT
TUBE SUCTION HIGH CAP CLEAR NV (SUCTIONS) ×1 IMPLANT
WATER STERILE IRR 1000ML POUR (IV SOLUTION) ×1 IMPLANT

## 2023-05-01 NOTE — Op Note (Signed)
OPERATIVE REPORT  SURGEON: Samson Frederic, MD   ASSISTANT: Clint Bolder, PA-C.  PREOPERATIVE DIAGNOSIS: Right hip arthritis.   POSTOPERATIVE DIAGNOSIS: Right hip arthritis.   PROCEDURE: Right total hip arthroplasty, anterior approach.   IMPLANTS: Biomet Taperloc Complete Microplasty stem, size 16 x 117 mm, high offset. Biomet G7 OsseoTi Cup, size 60 mm. Biomet Vivacit-E liner, size 36 mm, G, neutral. Biomet Biolox ceramic head ball, size 36 - 6 mm.  ANESTHESIA:  MAC and Spinal  ESTIMATED BLOOD LOSS:-250 mL    ANTIBIOTICS: 2g Ancef.  DRAINS: None.  COMPLICATIONS: None.   CONDITION: PACU - hemodynamically stable.   BRIEF CLINICAL NOTE: Todd Barrett. is a 72 y.o. male with a long-standing history of Right hip arthritis. After failing conservative management, the patient was indicated for total hip arthroplasty. The risks, benefits, and alternatives to the procedure were explained, and the patient elected to proceed.  PROCEDURE IN DETAIL: Surgical site was marked by myself in the pre-op holding area. Once inside the operating room, spinal anesthesia was obtained, and a foley catheter was inserted. The patient was then positioned on the Hana table.  All bony prominences were well padded.  The hip was prepped and draped in the normal sterile surgical fashion.  A time-out was called verifying side and site of surgery. The patient received IV antibiotics within 60 minutes of beginning the procedure.   Bikini incision was made, and superficial dissection was performed lateral to the ASIS. The direct anterior approach to the hip was performed through the Hueter interval.  Lateral femoral circumflex vessels were treated with the Auqumantys. The anterior capsule was exposed and an inverted T capsulotomy was made. The femoral neck cut was made to the level of the templated cut.  A corkscrew was placed into the head and the head was removed.  The femoral head was found to have  eburnated bone. The head was passed to the back table and was measured. Pubofemoral ligament was released off of the calcar, taking care to stay on bone. Superior capsule was released from the greater trochanter, taking care to stay lateral to the posterior border of the femoral neck in order to preserve the short external rotators.   Acetabular exposure was achieved, and the pulvinar and labrum were excised. Sequential reaming of the acetabulum was then performed up to a size 59 mm reamer. A 60 mm cup was then opened and impacted into place at approximately 40 degrees of abduction and 20 degrees of anteversion. The final polyethylene liner was impacted into place and acetabular osteophytes were removed.    I then gained femoral exposure taking care to protect the abductors and greater trochanter.  This was performed using standard external rotation, extension, and adduction.  A cookie cutter was used to enter the femoral canal, and then the femoral canal finder was placed.  Sequential broaching was performed up to a size 16.  Calcar planer was used on the femoral neck remnant.  I placed a high offset neck and a trial head ball.  The hip was reduced.  Leg lengths and offset were checked fluoroscopically.  The hip was dislocated and trial components were removed.  The final implants were placed, and the hip was reduced.  Fluoroscopy was used to confirm component position and leg lengths.  At 90 degrees of external rotation and full extension, the hip was stable to an anterior directed force.   The wound was copiously irrigated with Prontosan solution and normal saline using pule lavage.  Marcaine solution was injected into the periarticular soft tissue.  The wound was closed in layers using #1 Vicryl and V-Loc for the fascia, 2-0 Vicryl for the subcutaneous fat, 2-0 Monocryl for the deep dermal layer, 3-0 running Monocryl subcuticular stitch, and Dermabond for the skin.  Once the glue was fully dried, an  Aquacell Ag dressing was applied.  The patient was transported to the recovery room in stable condition.  Sponge, needle, and instrument counts were correct at the end of the case x2.  The patient tolerated the procedure well and there were no known complications.  The aquamantis was utilized for this case to help facilitate better hemostasis as patient was felt to be at increased risk of bleeding because of complex case requiring increased OR time and/or exposure.  A oscillating saw tip was utilized for this case to prevent damage to the soft tissue structures such as muscles, ligaments and tendons, and to ensure accurate bone cuts. This patient was at increased risk for above structures due to  minimally invasive approach.  Please note that a surgical assistant was a medical necessity for this procedure to perform it in a safe and expeditious manner. Assistant was necessary to provide appropriate retraction of vital neurovascular structures, to prevent femoral fracture, and to allow for anatomic placement of the prosthesis.

## 2023-05-01 NOTE — Transfer of Care (Signed)
Immediate Anesthesia Transfer of Care Note  Patient: Kuan Rama.  Procedure(s) Performed: TOTAL HIP ARTHROPLASTY ANTERIOR APPROACH (Right: Hip)  Patient Location: PACU  Anesthesia Type:Spinal  Level of Consciousness: sedated  Airway & Oxygen Therapy: Patient Spontanous Breathing and Patient connected to face mask oxygen  Post-op Assessment: Report given to RN and Post -op Vital signs reviewed and stable  Post vital signs: Reviewed and stable  Last Vitals:  Vitals Value Taken Time  BP 118/80 05/01/23 1641  Temp    Pulse 36 05/01/23 1642  Resp 16 05/01/23 1643  SpO2 85 % 05/01/23 1642  Vitals shown include unvalidated device data.  Last Pain:  Vitals:   05/01/23 1130  TempSrc: Oral         Complications: No notable events documented.

## 2023-05-01 NOTE — Anesthesia Procedure Notes (Signed)
Spinal  Start time: 05/01/2023 2:00 PM End time: 05/01/2023 2:10 PM Staffing Performed: resident/CRNA  Anesthesiologist: Val Eagle, MD Resident/CRNA: Shanon Payor, CRNA Performed by: Shanon Payor, CRNA Authorized by: Val Eagle, MD   Spinal Block Patient position: sitting Prep: DuraPrep Patient monitoring: heart rate, cardiac monitor, continuous pulse ox and blood pressure Approach: midline Location: L3-4 Injection technique: single-shot Needle Needle type: Pencan  Needle gauge: 24 G Needle length: 10 cm Assessment Sensory level: T4

## 2023-05-01 NOTE — Discharge Instructions (Signed)
? ?Dr. Brian Swinteck ?Joint Replacement Specialist ?[facility] ?[address] ?, [place] [address] ?[phone] ? ? ?TOTAL HIP REPLACEMENT POSTOPERATIVE DIRECTIONS ? ? ? ?Hip Rehabilitation, Guidelines Following Surgery  ? ?WEIGHT BEARING ?Weight bearing as tolerated with assist device (walker, cane, etc) as directed, use it as long as suggested by your surgeon or therapist, typically at least 4-6 weeks. ? ?The results of a hip operation are greatly improved after range of motion and muscle strengthening exercises. Follow all safety measures which are given to protect your hip. If any of these exercises cause increased pain or swelling in your joint, decrease the amount until you are comfortable again. Then slowly increase the exercises. Call your caregiver if you have problems or questions.  ? ?HOME CARE INSTRUCTIONS  ?Most of the following instructions are designed to prevent the dislocation of your new hip.  ?Remove items at home which could result in a fall. This includes throw rugs or furniture in walking pathways.  ?Continue medications as instructed at time of discharge. ?You may have some home medications which will be placed on hold until you complete the course of blood thinner medication. ?You may start showering once you are discharged home. Do not remove your dressing. ?Do not put on socks or shoes without following the instructions of your caregivers.   ?Sit on chairs with arms. Use the chair arms to help push yourself up when arising.  ?Arrange for the use of a toilet seat elevator so you are not sitting low.  ?Walk with walker as instructed.  ?You may resume a sexual relationship in one month or when given the OK by your caregiver.  ?Use walker as long as suggested by your caregivers.  ?You may put full weight on your legs and walk as much as is comfortable. ?Avoid periods of inactivity such as sitting longer than an hour when not asleep. This helps prevent blood  clots.  ?You may return to work once you are cleared by your surgeon.  ?Do not drive a car for 6 weeks or until released by your surgeon.  ?Do not drive while taking narcotics.  ?Wear elastic stockings for two weeks following surgery during the day but you may remove then at night.  ?Make sure you keep all of your appointments after your operation with all of your doctors and caregivers. You should call the office at the above phone number and make an appointment for approximately two weeks after the date of your surgery. ?Please pick up a stool softener and laxative for home use as long as you are requiring pain medications. ?ICE to the affected hip every three hours for 30 minutes at a time and then as needed for pain and swelling. Continue to use ice on the hip for pain and swelling from surgery. You may notice swelling that will progress down to the foot and ankle.  This is normal after surgery.  Elevate the leg when you are not up walking on it.   ?It is important for you to complete the blood thinner medication as prescribed by your doctor. ?Continue to use the breathing machine which will help keep your temperature down.  It is common for your temperature to cycle up and down following surgery, especially at night when you are not up moving around and exerting yourself.  The breathing machine keeps your lungs expanded and your temperature down. ? ?RANGE OF MOTION AND STRENGTHENING EXERCISES  ?These exercises are designed to help you   keep full movement of your hip joint. Follow your caregiver's or physical therapist's instructions. Perform all exercises about fifteen times, three times per day or as directed. Exercise both hips, even if you have had only one joint replacement. These exercises can be done on a training (exercise) mat, on the floor, on a table or on a bed. Use whatever works the best and is most comfortable for you. Use music or television while you are exercising so that the exercises are a  pleasant break in your day. This will make your life better with the exercises acting as a break in routine you can look forward to.  ?Lying on your back, slowly slide your foot toward your buttocks, raising your knee up off the floor. Then slowly slide your foot back down until your leg is straight again.  ?Lying on your back spread your legs as far apart as you can without causing discomfort.  ?Lying on your side, raise your upper leg and foot straight up from the floor as far as is comfortable. Slowly lower the leg and repeat.  ?Lying on your back, tighten up the muscle in the front of your thigh (quadriceps muscles). You can do this by keeping your leg straight and trying to raise your heel off the floor. This helps strengthen the largest muscle supporting your knee.  ?Lying on your back, tighten up the muscles of your buttocks both with the legs straight and with the knee bent at a comfortable angle while keeping your heel on the floor.  ? ?SKILLED REHAB INSTRUCTIONS: ?If the patient is transferred to a skilled rehab facility following release from the hospital, a list of the current medications will be sent to the facility for the patient to continue.  When discharged from the skilled rehab facility, please have the facility set up the patient's Home Health Physical Therapy prior to being released. Also, the skilled facility will be responsible for providing the patient with their medications at time of release from the facility to include their pain medication and their blood thinner medication. If the patient is still at the rehab facility at time of the two week follow up appointment, the skilled rehab facility will also need to assist the patient in arranging follow up appointment in our office and any transportation needs. ? ?POST-OPERATIVE OPIOID TAPER INSTRUCTIONS: ?It is important to wean off of your opioid medication as soon as possible. If you do not need pain medication after your surgery it is ok  to stop day one. ?Opioids include: ?Codeine, Hydrocodone(Norco, Vicodin), Oxycodone(Percocet, oxycontin) and hydromorphone amongst others.  ?Long term and even short term use of opiods can cause: ?Increased pain response ?Dependence ?Constipation ?Depression ?Respiratory depression ?And more.  ?Withdrawal symptoms can include ?Flu like symptoms ?Nausea, vomiting ?And more ?Techniques to manage these symptoms ?Hydrate well ?Eat regular healthy meals ?Stay active ?Use relaxation techniques(deep breathing, meditating, yoga) ?Do Not substitute Alcohol to help with tapering ?If you have been on opioids for less than two weeks and do not have pain than it is ok to stop all together.  ?Plan to wean off of opioids ?This plan should start within one week post op of your joint replacement. ?Maintain the same interval or time between taking each dose and first decrease the dose.  ?Cut the total daily intake of opioids by one tablet each day ?Next start to increase the time between doses. ?The last dose that should be eliminated is the evening dose.  ? ? ?MAKE   SURE YOU:  ?Understand these instructions.  ?Will watch your condition.  ?Will get help right away if you are not doing well or get worse. ? ?Pick up stool softner and laxative for home use following surgery while on pain medications. ?Do not remove your dressing. ?The dressing is waterproof--it is OK to take showers. ?Continue to use ice for pain and swelling after surgery. ?Do not use any lotions or creams on the incision until instructed by your surgeon. ?Total Hip Protocol. ? ?

## 2023-05-01 NOTE — Interval H&P Note (Signed)
History and Physical Interval Note:  05/01/2023 11:15 AM  Todd Barrett.  has presented today for surgery, with the diagnosis of Right hip osteoarthritis.  The various methods of treatment have been discussed with the patient and family. After consideration of risks, benefits and other options for treatment, the patient has consented to  Procedure(s) with comments: TOTAL HIP ARTHROPLASTY ANTERIOR APPROACH (Right) - 140 as a surgical intervention.  The patient's history has been reviewed, patient examined, no change in status, stable for surgery.  I have reviewed the patient's chart and labs.  Questions were answered to the patient's satisfaction.     Todd Barrett

## 2023-05-01 NOTE — Anesthesia Postprocedure Evaluation (Signed)
Anesthesia Post Note  Patient: Todd Barrett.  Procedure(s) Performed: TOTAL HIP ARTHROPLASTY ANTERIOR APPROACH (Right: Hip)     Patient location during evaluation: PACU Anesthesia Type: MAC Level of consciousness: awake and alert Pain management: pain level controlled Vital Signs Assessment: post-procedure vital signs reviewed and stable Respiratory status: spontaneous breathing, nonlabored ventilation and respiratory function stable Cardiovascular status: stable and blood pressure returned to baseline Postop Assessment: no apparent nausea or vomiting Anesthetic complications: no   No notable events documented.  Last Vitals:  Vitals:   05/01/23 1806 05/01/23 1817  BP:  105/87  Pulse: 69 87  Resp: 13 17  Temp:    SpO2: 98% 100%    Last Pain:  Vitals:   05/01/23 1642  TempSrc: Oral                 Marciano Mundt

## 2023-05-02 ENCOUNTER — Encounter (HOSPITAL_COMMUNITY): Payer: Self-pay | Admitting: Orthopedic Surgery

## 2023-05-02 DIAGNOSIS — I4891 Unspecified atrial fibrillation: Secondary | ICD-10-CM | POA: Diagnosis not present

## 2023-05-02 DIAGNOSIS — I10 Essential (primary) hypertension: Secondary | ICD-10-CM | POA: Diagnosis not present

## 2023-05-02 DIAGNOSIS — Z96611 Presence of right artificial shoulder joint: Secondary | ICD-10-CM | POA: Diagnosis not present

## 2023-05-02 DIAGNOSIS — M1611 Unilateral primary osteoarthritis, right hip: Secondary | ICD-10-CM | POA: Diagnosis not present

## 2023-05-02 DIAGNOSIS — Z96653 Presence of artificial knee joint, bilateral: Secondary | ICD-10-CM | POA: Diagnosis not present

## 2023-05-02 DIAGNOSIS — Z96612 Presence of left artificial shoulder joint: Secondary | ICD-10-CM | POA: Diagnosis not present

## 2023-05-02 LAB — CBC
HCT: 37.6 % — ABNORMAL LOW (ref 39.0–52.0)
Hemoglobin: 12.8 g/dL — ABNORMAL LOW (ref 13.0–17.0)
MCH: 31.4 pg (ref 26.0–34.0)
MCHC: 34 g/dL (ref 30.0–36.0)
MCV: 92.4 fL (ref 80.0–100.0)
Platelets: 154 10*3/uL (ref 150–400)
RBC: 4.07 MIL/uL — ABNORMAL LOW (ref 4.22–5.81)
RDW: 12.6 % (ref 11.5–15.5)
WBC: 11.3 10*3/uL — ABNORMAL HIGH (ref 4.0–10.5)
nRBC: 0 % (ref 0.0–0.2)

## 2023-05-02 LAB — BASIC METABOLIC PANEL
Anion gap: 10 (ref 5–15)
BUN: 25 mg/dL — ABNORMAL HIGH (ref 8–23)
CO2: 20 mmol/L — ABNORMAL LOW (ref 22–32)
Calcium: 8.6 mg/dL — ABNORMAL LOW (ref 8.9–10.3)
Chloride: 104 mmol/L (ref 98–111)
Creatinine, Ser: 1.11 mg/dL (ref 0.61–1.24)
GFR, Estimated: >60 mL/min (ref >60)
Glucose, Bld: 129 mg/dL — ABNORMAL HIGH (ref 70–99)
Potassium: 4 mmol/L (ref 3.5–5.1)
Sodium: 134 mmol/L — ABNORMAL LOW (ref 135–145)

## 2023-05-02 MED ORDER — HYDROCODONE-ACETAMINOPHEN 5-325 MG PO TABS: 1.0000 | ORAL_TABLET | ORAL | 0 refills | Status: AC | PRN

## 2023-05-02 MED ORDER — ONDANSETRON HCL 4 MG PO TABS: 4.0000 mg | ORAL_TABLET | Freq: Three times a day (TID) | ORAL | 0 refills | Status: AC | PRN

## 2023-05-02 MED ORDER — SENNA 8.6 MG PO TABS: 2.0000 | ORAL_TABLET | Freq: Every day | ORAL | 0 refills | Status: AC

## 2023-05-02 MED ORDER — DOCUSATE SODIUM 100 MG PO CAPS: 100.0000 mg | ORAL_CAPSULE | Freq: Two times a day (BID) | ORAL | 0 refills | Status: AC

## 2023-05-02 MED ORDER — POLYETHYLENE GLYCOL 3350 17 G PO PACK: 17.0000 g | PACK | Freq: Every day | ORAL | 0 refills | Status: AC | PRN

## 2023-05-02 NOTE — Plan of Care (Signed)
  Problem: Education: Goal: Knowledge of the prescribed therapeutic regimen will improve Outcome: Progressing   Problem: Pain Management: Goal: Pain level will decrease with appropriate interventions Outcome: Progressing   Problem: Nutrition: Goal: Adequate nutrition will be maintained Outcome: Progressing   Problem: Pain Managment: Goal: General experience of comfort will improve Outcome: Progressing   

## 2023-05-02 NOTE — TOC Transition Note (Signed)
Transition of Care [facility]) - CM/SW Discharge Note   Patient Details  Name: Todd Barrett. MRN: [id number] Date of Birth: 1951/02/05  [facility] of [facility]) CM/SW Contact:  Amada Jupiter, LCSW Phone Number: 05/02/2023, 10:20 AM   Clinical Narrative:     Met with pt and confirming he has received a RW to room via [employer].  Plan for HEP.  No further TOC needs.  Final next level of care: Home/Self Care Barriers to Discharge: No Barriers Identified   Patient Goals and CMS Choice      Discharge Placement                         Discharge Plan and Services Additional resources added to the After Visit Summary for                  DME Arranged: Walker rolling DME Agency: [employer]                  Social Determinants of Health (SDOH) Interventions SDOH Screenings   Food Insecurity: No Food Insecurity (05/01/2023)  Housing: Low Risk  (05/01/2023)  Transportation Needs: No Transportation Needs (05/01/2023)  Utilities: Not At Risk (05/01/2023)  Alcohol Screen: Low Risk  (06/24/2022)  Depression (PHQ2-9): Low Risk  (12/24/2022)  Financial Resource Strain: Low Risk  (06/24/2022)  Physical Activity: Sufficiently Active (06/24/2022)  Social Connections: Moderately Isolated (06/24/2022)  Stress: No Stress Concern Present (06/24/2022)  Tobacco Use: Low Risk  (05/01/2023)     Readmission Risk Interventions     No data to display

## 2023-05-02 NOTE — Discharge Summary (Signed)
Physician Discharge Summary  Patient ID: Todd Barrett. MRN: [id number] DOB/AGE: 1951/05/01 72 y.o.  Admit date: 05/01/2023 Discharge date: 05/02/2023  Admission Diagnoses:  S/P total right hip arthroplasty  Discharge Diagnoses:  Principal Problem:   S/P total right hip arthroplasty   Past Medical History:  Diagnosis Date   ALLERGIC RHINITIS 01/08/2010   Allergy    dust mites, pet dander   Atrial fibrillation with RVR (HCC) 01/19/2018   CERVICAL RADICULOPATHY, RIGHT 01/08/2010   CHEST PAIN 01/08/2010   DIVERTICULOSIS, COLON 01/08/2010   Dysrhythmia    post op   during PT a fib RVR   GERD (gastroesophageal reflux disease)    Zantac OTC  prn   GOUT 01/08/2010   in past/ when has surgery   HYPERLIPIDEMIA 01/08/2010   Hypertension    Lumbar disc disease 08/05/2011   Osteoarthritis of right knee    end stage   OSTEOARTHRITIS, KNEES, 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    Surgeries: Procedure(s): TOTAL HIP ARTHROPLASTY ANTERIOR APPROACH on 05/01/2023   Consultants (if any):   Discharged Condition: Improved  Hospital Course: Todd Page. is an 72 y.o. male who was admitted 05/01/2023 with a diagnosis of S/P total right hip arthroplasty and went to the operating room on 05/01/2023 and underwent the above named procedures.    He was given perioperative antibiotics:  Anti-infectives (From admission, onward)    Start     Dose/Rate Route Frequency Ordered Stop   05/01/23 2000  ceFAZolin (ANCEF) IVPB 2g/100 mL premix        2 g 200 mL/hr over 30 Minutes Intravenous Every 6 hours 05/01/23 1819 05/02/23 0212   05/01/23 1534  vancomycin (VANCOCIN) powder  Status:  Discontinued          As needed 05/01/23 1535 05/01/23 1813   05/01/23 1130  ceFAZolin (ANCEF) IVPB 2g/100 mL premix        2 g 200 mL/hr over 30 Minutes Intravenous On call to O.R. 05/01/23 1115 05/01/23 1410       He was given sequential compression devices, early ambulation, and Eliquis for DVT  prophylaxis.  POD#1 Pain well controlled. He ambulated 220 feet with PT. Patient d/c home with OPPT.   He benefited maximally from the hospital stay and there were no complications.    Recent vital signs:  Vitals:   05/02/23 0536 05/02/23 1025  BP: 121/85 (!) 142/98  Pulse: 98 86  Resp: 17 18  Temp: 98.6 F (37 C) 97.6 F (36.4 C)  SpO2: 99% 98%    Recent laboratory studies:  Lab Results  Component Value Date   HGB 12.8 (L) 05/02/2023   HGB 15.0 04/23/2023   HGB 15.9 12/24/2022   Lab Results  Component Value Date   WBC 11.3 (H) 05/02/2023   PLT 154 05/02/2023   Lab Results  Component Value Date   INR 1.2 01/10/2021   Lab Results  Component Value Date   NA 134 (L) 05/02/2023   K 4.0 05/02/2023   CL 104 05/02/2023   CO2 20 (L) 05/02/2023   BUN 25 (H) 05/02/2023   CREATININE 1.11 05/02/2023   GLUCOSE 129 (H) 05/02/2023     Allergies as of 05/02/2023       Reactions   Dust Mite Extract Other (See Comments)   Nasal Congestion   Drug Ingredient [bee Pollen] Other (See Comments)   Congestion   Other Other (See Comments)   Pet Dander- Runny nose  Medication List     TAKE these medications    atorvastatin 10 MG tablet Commonly known as: LIPITOR TAKE 1 TABLET BY MOUTH EVERY DAY   carvedilol 3.125 MG tablet Commonly known as: COREG Take 1 tablet (3.125 mg total) by mouth 2 (two) times daily.   docusate sodium 100 MG capsule Commonly known as: Colace Take 1 capsule (100 mg total) by mouth 2 (two) times daily.   Eliquis 5 MG Tabs tablet Generic drug: apixaban Take 1 tablet by mouth twice daily   fexofenadine 180 MG tablet Commonly known as: ALLEGRA Take 180 mg by mouth in the morning and at bedtime.   FIBER-CAPS PO Take 2 tablets by mouth in the morning.   fluticasone 50 MCG/ACT nasal spray Commonly known as: FLONASE Place 2 sprays into the nose daily as needed for allergies or rhinitis. For allergies.   HYDROcodone-acetaminophen 5-325  MG tablet Commonly known as: NORCO/VICODIN Take 1 tablet by mouth every 4 (four) hours as needed for up to 7 days for moderate pain or severe pain.   lisinopril 10 MG tablet Commonly known as: ZESTRIL Take 1 tablet by mouth once daily   naproxen sodium 220 MG tablet Commonly known as: ALEVE Take 440 mg by mouth 2 (two) times daily as needed (Pain).   ondansetron 4 MG tablet Commonly known as: Zofran Take 1 tablet (4 mg total) by mouth every 8 (eight) hours as needed for nausea or vomiting.   polyethylene glycol 17 g packet Commonly known as: MiraLax Take 17 g by mouth daily as needed for mild constipation or moderate constipation.   senna 8.6 MG Tabs tablet Commonly known as: SENOKOT Take 2 tablets (17.2 mg total) by mouth at bedtime for 15 days.   Vitamin D3 50 MCG (2000 UT) capsule Take 4,000 Units by mouth daily.               Discharge Care Instructions  (From admission, onward)           Start     Ordered   05/02/23 0000  Weight bearing as tolerated        05/02/23 0832   05/02/23 0000  Change dressing       Comments: Do not change your dressing.   05/02/23 0832              WEIGHT BEARING   Weight bearing as tolerated with assist device (walker, cane, etc) as directed, use it as long as suggested by your surgeon or therapist, typically at least 4-6 weeks.   EXERCISES  Results after joint replacement surgery are often greatly improved when you follow the exercise, range of motion and muscle strengthening exercises prescribed by your doctor. Safety measures are also important to protect the joint from further injury. Any time any of these exercises cause you to have increased pain or swelling, decrease what you are doing until you are comfortable again and then slowly increase them. If you have problems or questions, call your caregiver or physical therapist for advice.   Rehabilitation is important following a joint replacement. After just a few  days of immobilization, the muscles of the leg can become weakened and shrink (atrophy).  These exercises are designed to build up the tone and strength of the thigh and leg muscles and to improve motion. Often times heat used for twenty to thirty minutes before working out will loosen up your tissues and help with improving the range of motion but do not use heat  for the first two weeks following surgery (sometimes heat can increase post-operative swelling).   These exercises can be done on a training (exercise) mat, on the floor, on a table or on a bed. Use whatever works the best and is most comfortable for you.    Use music or television while you are exercising so that the exercises are a pleasant break in your day. This will make your life better with the exercises acting as a break in your routine that you can look forward to.   Perform all exercises about fifteen times, three times per day or as directed.  You should exercise both the operative leg and the other leg as well.  Exercises include:   Quad Sets - Tighten up the muscle on the front of the thigh (Quad) and hold for 5-10 seconds.   Straight Leg Raises - With your knee straight (if you were given a brace, keep it on), lift the leg to 60 degrees, hold for 3 seconds, and slowly lower the leg.  Perform this exercise against resistance later as your leg gets stronger.  Leg Slides: Lying on your back, slowly slide your foot toward your buttocks, bending your knee up off the floor (only go as far as is comfortable). Then slowly slide your foot back down until your leg is flat on the floor again.  Angel Wings: Lying on your back spread your legs to the side as far apart as you can without causing discomfort.  Hamstring Strength:  Lying on your back, push your heel against the floor with your leg straight by tightening up the muscles of your buttocks.  Repeat, but this time bend your knee to a comfortable angle, and push your heel against the  floor.  You may put a pillow under the heel to make it more comfortable if necessary.   A rehabilitation program following joint replacement surgery can speed recovery and prevent re-injury in the future due to weakened muscles. Contact your doctor or a physical therapist for more information on knee rehabilitation.    CONSTIPATION  Constipation is defined medically as fewer than three stools per week and severe constipation as less than one stool per week.  Even if you have a regular bowel pattern at home, your normal regimen is likely to be disrupted due to multiple reasons following surgery.  Combination of anesthesia, postoperative narcotics, change in appetite and fluid intake all can affect your bowels.   YOU MUST use at least one of the following options; they are listed in order of increasing strength to get the job done.  They are all available over the counter, and you may need to use some, POSSIBLY even all of these options:    Drink plenty of fluids (prune juice may be helpful) and high fiber foods Colace 100 mg by mouth twice a day  Senokot for constipation as directed and as needed Dulcolax (bisacodyl), take with full glass of water  Miralax (polyethylene glycol) once or twice a day as needed.  If you have tried all these things and are unable to have a bowel movement in the first 3-4 days after surgery call either your surgeon or your primary doctor.    If you experience loose stools or diarrhea, hold the medications until you stool forms back up.  If your symptoms do not get better within 1 week or if they get worse, check with your doctor.  If you experience "the worst abdominal pain ever" or develop nausea  or vomiting, please contact the office immediately for further recommendations for treatment.   ITCHING:  If you experience itching with your medications, try taking only a single pain pill, or even half a pain pill at a time.  You can also use Benadryl over the counter for  itching or also to help with sleep.   TED HOSE STOCKINGS:  Use stockings on both legs until for at least 2 weeks or as directed by physician office. They may be removed at night for sleeping.  MEDICATIONS:  See your medication summary on the "After Visit Summary" that nursing will review with you.  You may have some home medications which will be placed on hold until you complete the course of blood thinner medication.  It is important for you to complete the blood thinner medication as prescribed.  PRECAUTIONS:  If you experience chest pain or shortness of breath - call 911 immediately for transfer to the hospital emergency department.   If you develop a fever greater that 101 F, purulent drainage from wound, increased redness or drainage from wound, foul odor from the wound/dressing, or calf pain - CONTACT YOUR SURGEON.                                                   FOLLOW-UP APPOINTMENTS:  If you do not already have a post-op appointment, please call the office for an appointment to be seen by your surgeon.  Guidelines for how soon to be seen are listed in your "After Visit Summary", but are typically between 1-4 weeks after surgery.  OTHER INSTRUCTIONS:   Knee Replacement:  Do not place pillow under knee, focus on keeping the knee straight while resting. CPM instructions: 0-90 degrees, 2 hours in the morning, 2 hours in the afternoon, and 2 hours in the evening. Place foam block, curve side up under heel at all times except when in CPM or when walking.  DO NOT modify, tear, cut, or change the foam block in any way.   MAKE SURE YOU:  Understand these instructions.  Get help right away if you are not doing well or get worse.    Thank you for letting us be a part of your medical care team.  It is a privilege we respect greatly.  We hope these instructions will help you stay on track for a fast and full recovery!   Diagnostic Studies: DG Pelvis Portable  Result Date: 05/01/2023 CLINICAL  DATA:  Status post right hip arthroplasty. EXAM: PORTABLE PELVIS 1-2 VIEWS COMPARISON:  None Available. FINDINGS: Right hip arthroplasty in expected alignment. No periprosthetic lucency or fracture. Recent postsurgical change includes air and edema in the soft tissues. IMPRESSION: Right hip arthroplasty without immediate postoperative complication. Electronically Signed   By: Narda Rutherford M.D.   On: 05/01/2023 17:26   DG HIP UNILAT WITH PELVIS 1V RIGHT  Result Date: 05/01/2023 CLINICAL DATA:  Elective surgery. EXAM: DG HIP (WITH OR WITHOUT PELVIS) 1V RIGHT COMPARISON:  None Available. FINDINGS: Two fluoroscopic spot views of the pelvis and right hip obtained in the operating room. Images during hip arthroplasty. Fluoroscopy time 18 seconds. Dose 1.2738 mGy. IMPRESSION: Fluoroscopic spot views during hip arthroplasty. Electronically Signed   By: Narda Rutherford M.D.   On: 05/01/2023 16:23   DG C-Arm 1-60 Min-No Report  Result Date: 05/01/2023 Fluoroscopy was  utilized by the requesting physician.  No radiographic interpretation.   DG C-Arm 1-60 Min-No Report  Result Date: 05/01/2023 Fluoroscopy was utilized by the requesting physician.  No radiographic interpretation.    Disposition: Discharge disposition: 01-Home or Self Care       Discharge Instructions     Call MD / Call 911   Complete by: As directed    If you experience chest pain or shortness of breath, CALL 911 and be transported to the hospital emergency room.  If you develope a fever above 101 F, pus (white drainage) or increased drainage or redness at the wound, or calf pain, call your surgeon's office.   Change dressing   Complete by: As directed    Do not change your dressing.   Constipation Prevention   Complete by: As directed    Drink plenty of fluids.  Prune juice may be helpful.  You may use a stool softener, such as Colace (over the counter) 100 mg twice a day.  Use MiraLax (over the counter) for constipation as  needed.   Diet - low sodium heart healthy   Complete by: As directed    Discharge instructions   Complete by: As directed    Elevate toes above nose. Use cryotherapy as needed for pain and swelling.   Driving restrictions   Complete by: As directed    No driving for 6 weeks   Increase activity slowly as tolerated   Complete by: As directed    Lifting restrictions   Complete by: As directed    No lifting for 6 weeks   Post-operative opioid taper instructions:   Complete by: As directed    POST-OPERATIVE OPIOID TAPER INSTRUCTIONS: It is important to wean off of your opioid medication as soon as possible. If you do not need pain medication after your surgery it is ok to stop day one. Opioids include: Codeine, Hydrocodone(Norco, Vicodin), Oxycodone(Percocet, oxycontin) and hydromorphone amongst others.  Long term and even short term use of opiods can cause: Increased pain response Dependence Constipation Depression Respiratory depression And more.  Withdrawal symptoms can include Flu like symptoms Nausea, vomiting And more Techniques to manage these symptoms Hydrate well Eat regular healthy meals Stay active Use relaxation techniques(deep breathing, meditating, yoga) Do Not substitute Alcohol to help with tapering If you have been on opioids for less than two weeks and do not have pain than it is ok to stop all together.  Plan to wean off of opioids This plan should start within one week post op of your joint replacement. Maintain the same interval or time between taking each dose and first decrease the dose.  Cut the total daily intake of opioids by one tablet each day Next start to increase the time between doses. The last dose that should be eliminated is the evening dose.      TED hose   Complete by: As directed    Use stockings (TED hose) for 2 weeks on both leg(s).  You may remove them at night for sleeping.   Weight bearing as tolerated   Complete by: As directed          Follow-up Information     Clois Dupes, PA-C. Schedule an appointment as soon as possible for a visit in 2 week(s).   Specialty: Orthopedic Surgery Why: For suture removal, For wound re-check Contact information: [address], [address] [place] [address] [phone]  Signed: Clois Dupes 05/02/2023, 3:45 PM

## 2023-05-02 NOTE — Progress Notes (Signed)
Patient discharged to home w/ family. Given all belongings, instructions, equipment. Verbalized understanding of instructions. Escorted to pov via w/c. 

## 2023-05-02 NOTE — Evaluation (Signed)
Physical Therapy One Time Evaluation Patient Details Name: Todd Barrett. MRN: [id number] DOB: Feb 28, 1951 Today's Date: 05/02/2023  History of Present Illness  Pt is a 72 year old male s/p Right THA direct anterior approach on 05/01/23.  Clinical Impression  Patient evaluated by Physical Therapy with no further acute PT needs identified. All education has been completed and the patient has no further questions.  Pt assisted with ambulating in hallway, practicing steps, and performed LE exercises.  Pt provided with HEP handout.  Pt had no further questions and feels ready for d/c home today.  PT is signing off. Thank you for this referral.          Recommendations for follow up therapy are one component of a multi-disciplinary discharge planning process, led by the attending physician.  Recommendations may be updated based on patient status, additional functional criteria and insurance authorization.  Follow Up Recommendations       Assistance Recommended at Discharge PRN  Patient can return home with the following       Equipment Recommendations Rolling walker (2 wheels)  Recommendations for Other Services       Functional Status Assessment Patient has had a recent decline in their functional status and demonstrates the ability to make significant improvements in function in a reasonable and predictable amount of time.     Precautions / Restrictions Precautions Precautions: Fall Restrictions Weight Bearing Restrictions: No      Mobility  Bed Mobility Overal bed mobility: Needs Assistance Bed Mobility: Supine to Sit     Supine to sit: Supervision, HOB elevated          Transfers Overall transfer level: Needs assistance Equipment used: Rolling walker (2 wheels) Transfers: Sit to/from Stand Sit to Stand: Supervision, Min guard           General transfer comment: verbal cues for UE and LE positioning for pain control    Ambulation/Gait Ambulation/Gait  assistance: Min guard Gait Distance (Feet): 220 Feet Assistive device: Rolling walker (2 wheels) Gait Pattern/deviations: Step-to pattern, Decreased stance time - right, Antalgic       General Gait Details: verbal cues for sequence, RW positioning, step length, posture  Stairs Stairs: Yes Stairs assistance: Min guard Stair Management: Step to pattern, Forwards, Two rails Number of Stairs: 4 General stair comments: performed with 2 rails and then with only right rail; pt also has ramp at home  Wheelchair Mobility    Modified Rankin (Stroke Patients Only)       Balance                                             Pertinent Vitals/Pain Pain Assessment Pain Assessment: 0-10 Pain Score: 3  Pain Location: right hip Pain Descriptors / Indicators: Aching, Sore Pain Intervention(s): Repositioned, Monitored during session    Home Living Family/patient expects to be discharged to:: Private residence Living Arrangements: Spouse/significant other   Type of Home: House Home Access: Stairs to enter;Ramped entrance Entrance Stairs-Rails: None Entrance Stairs-Number of Steps: 2   Home Layout: Able to live on main level with bedroom/bathroom Home Equipment: Cane - single point      Prior Function Prior Level of Function : Independent/Modified Independent                     Hand Dominance  Extremity/Trunk Assessment        Lower Extremity Assessment Lower Extremity Assessment: RLE deficits/detail RLE Deficits / Details: anticipated post op hip weakness, able to perform ankle pumps       Communication   Communication: No difficulties  Cognition Arousal/Alertness: Awake/alert Behavior During Therapy: WFL for tasks assessed/performed Overall Cognitive Status: Within Functional Limits for tasks assessed                                          General Comments      Exercises Total Joint Exercises Ankle  Circles/Pumps: AROM, Both, 10 reps Quad Sets: AROM, Both, 10 reps Heel Slides: AAROM, Right, 10 reps Hip ABduction/ADduction: AAROM, Standing, Supine, AROM, 10 reps, Right Long Arc Quad: AROM, Right, Standing, 10 reps Knee Flexion: AROM, Right, Standing, 10 reps Marching in Standing: AROM, Right, 10 reps, Standing Standing Hip Extension: AROM, Standing, 10 reps, Right   Assessment/Plan    PT Assessment All further PT needs can be met in the next venue of care  PT Problem List Decreased mobility;Decreased strength;Decreased knowledge of use of DME       PT Treatment Interventions      PT Goals (Current goals can be found in the Care Plan section)  Acute Rehab PT Goals PT Goal Formulation: All assessment and education complete, DC therapy    Frequency       Co-evaluation               AM-PAC PT "6 Clicks" Mobility  Outcome Measure Help needed turning from your back to your side while in a flat bed without using bedrails?: None Help needed moving from lying on your back to sitting on the side of a flat bed without using bedrails?: None Help needed moving to and from a bed to a chair (including a wheelchair)?: None Help needed standing up from a chair using your arms (e.g., wheelchair or bedside chair)?: None Help needed to walk in hospital room?: A Little Help needed climbing 3-5 steps with a railing? : A Little 6 Click Score: 22    End of Session Equipment Utilized During Treatment: Gait belt Activity Tolerance: Patient tolerated treatment well Patient left: in chair;with call bell/phone within reach Nurse Communication: Mobility status PT Visit Diagnosis: Difficulty in walking, not elsewhere classified (R26.2)    Time: 1610-9604 PT Time Calculation (min) (ACUTE ONLY): 20 min   Charges:   PT Evaluation $PT Eval Low Complexity: 1 Low        Kati PT, DPT Physical Therapist Acute Rehabilitation Services Office: [phone]   Kati L Payson 05/02/2023, 1:06  PM

## 2023-05-02 NOTE — Progress Notes (Signed)
    Subjective:  Patient reports pain as mild to moderate.  Denies N/V/CP/SOB/Abd pain. He reports that he did well overnight. Soreness in his thigh. He denies any tingling or numbness in LE bilaterally. Catheter out, he has voided.   Objective:   VITALS:   Vitals:   05/01/23 2036 05/01/23 2250 05/02/23 0134 05/02/23 0536  BP: 127/74 129/79 119/86 121/85  Pulse: 98 (!) 108 (!) 55 98  Resp: 17 17 17 17   Temp: 98.3 F (36.8 C) 98.4 F (36.9 C) 98.5 F (36.9 C) 98.6 F (37 C)  TempSrc: Oral Oral Oral Oral  SpO2: 100% 99% 98% 99%  Weight:      Height:        Patient is sitting up in bed. NAD.  Neurologically intact ABD soft Neurovascular intact Sensation intact distally Intact pulses distally Dorsiflexion/Plantar flexion intact Incision: dressing C/D/I No cellulitis present Compartment soft   Lab Results  Component Value Date   WBC 11.3 (H) 05/02/2023   HGB 12.8 (L) 05/02/2023   HCT 37.6 (L) 05/02/2023   MCV 92.4 05/02/2023   PLT 154 05/02/2023   BMET    Component Value Date/Time   NA 134 (L) 05/02/2023 0330   NA 139 03/13/2020 1307   K 4.0 05/02/2023 0330   CL 104 05/02/2023 0330   CO2 20 (L) 05/02/2023 0330   GLUCOSE 129 (H) 05/02/2023 0330   GLUCOSE 104 (H) 10/23/2006 0848   BUN 25 (H) 05/02/2023 0330   BUN 27 03/13/2020 1307   CREATININE 1.11 05/02/2023 0330   CALCIUM 8.6 (L) 05/02/2023 0330   GFRNONAA >60 05/02/2023 0330     Assessment/Plan: 1 Day Post-Op   Principal Problem:   S/P total right hip arthroplasty   WBAT with walker DVT ppx:  Eliquis , SCDs, TEDS PO pain control PT/OT: PT has not seen yet. PT to come by today.  Dispo: D/c home with HEP once cleared with PT.   Clois Dupes, PA-C 05/02/2023, 8:28 AM   [facility]  [place] [address], [address], [place], [place] [address] Phone: [phone] [email or web address] Facebook  [employer]

## 2023-05-05 ENCOUNTER — Telehealth: Payer: Self-pay

## 2023-05-05 NOTE — Transitions of Care (Post Inpatient/ED Visit) (Signed)
05/05/2023  Name: Todd Barrett. MRN: [id number] DOB: November 24, 1951  Today's TOC FU Call Status: Today's TOC FU Call Status:: Successful TOC FU Call Competed TOC FU Call Complete Date: 05/05/23  Transition Care Management Follow-up Telephone Call Date of Discharge: 05/02/23 Discharge Facility: [facility]) Type of Discharge: Inpatient Admission Primary Inpatient Discharge Diagnosis:: S/P total right hip arthroplasty How have you been since you were released from the hospital?: Better Any questions or concerns?: No  Items Reviewed: Did you receive and understand the discharge instructions provided?: Yes Medications obtained,verified, and reconciled?: Yes (Medications Reviewed) Any new allergies since your discharge?: No Dietary orders reviewed?: Yes Do you have support at home?: Yes  Medications Reviewed Today: Medications Reviewed Today     Reviewed by Merleen Nicely, LPN (Licensed Practical Nurse) on 05/05/23 at [id number]  Med List Status: <None>   Medication Order Taking? Sig Documenting Provider Last Dose Status Informant  atorvastatin (LIPITOR) 10 MG tablet [id number] Yes TAKE 1 TABLET BY MOUTH EVERY DAY Corwin Levins, MD Taking Active Self  Calcium Polycarbophil (FIBER-CAPS PO) [id number] Yes Take 2 tablets by mouth in the morning. [provider] Taking Active Self  carvedilol (COREG) 3.125 MG tablet [id number] Yes Take 1 tablet (3.125 mg total) by mouth 2 (two) times daily. Sharlene Dory, PA-C Taking Active Self  Cholecalciferol (VITAMIN D3) 50 MCG (2000 UT) capsule [id number] Yes Take 4,000 Units by mouth daily. [provider] Taking Active Self  docusate sodium (COLACE) 100 MG capsule [id number] Yes Take 1 capsule (100 mg total) by mouth 2 (two) times daily. [address], PA-C Taking Active   ELIQUIS 5 MG TABS tablet [id number] Yes Take 1 tablet by mouth twice daily Corwin Levins, MD Taking Active Self  fexofenadine (ALLEGRA) 180 MG tablet [id number]  Yes Take 180 mg by mouth in the morning and at bedtime. [provider] Taking Active Self  fluticasone (FLONASE) 50 MCG/ACT nasal spray [id number] Yes Place 2 sprays into the nose daily as needed for allergies or rhinitis. For allergies. [provider] Taking Active Self  HYDROcodone-acetaminophen (NORCO/VICODIN) 5-325 MG tablet [id number] Yes Take 1 tablet by mouth every 4 (four) hours as needed for up to 7 days for moderate pain or severe pain. Clois Dupes, [place] Taking Active   lisinopril (ZESTRIL) 10 MG tablet [id number] Yes Take 1 tablet by mouth once daily Corwin Levins, MD Taking Active Self  naproxen sodium (ALEVE) 220 MG tablet [id number] Yes Take 440 mg by mouth 2 (two) times daily as needed (Pain). [provider] Taking Active Self  ondansetron (ZOFRAN) 4 MG tablet [id number] Yes Take 1 tablet (4 mg total) by mouth every 8 (eight) hours as needed for nausea or vomiting. Clint Bolder S, [place] Taking Active   polyethylene glycol (MIRALAX) 17 g packet [id number] Yes Take 17 g by mouth daily as needed for mild constipation or moderate constipation. Clois Dupes, [place] Taking Active   senna (SENOKOT) 8.6 MG TABS tablet [id number] Yes Take 2 tablets (17.2 mg total) by mouth at bedtime for 15 days. Clois Dupes, [place] Taking Active             Home Care and Equipment/Supplies: Were Home Health Services Ordered?: No Any new equipment or medical supplies ordered?: No  Functional Questionnaire: Do you need assistance with bathing/showering or dressing?: Yes Do you need assistance with meal preparation?: Yes Do you need assistance with eating?: No Do you have difficulty maintaining continence: No  Do you need assistance with getting out of bed/getting out of a chair/moving?: Yes (wife assists) Do you have difficulty managing or taking your medications?: No  Follow up appointments reviewed: PCP Follow-up appointment confirmed?: No MD Provider Line Number:[phone]  Given: Yes Specialist Hospital Follow-up appointment confirmed?: Yes Date of Specialist follow-up appointment?: 05/15/23 Follow-Up Specialty Provider:: Clint Bolder PA-C Do you need transportation to your follow-up appointment?: No Do you understand care options if your condition(s) worsen?: Yes-patient verbalized understanding    SIGNATURE  Woodfin Ganja LPN [facility] Nurse Health Advisor Direct Dial [phone]

## 2023-05-07 ENCOUNTER — Telehealth (INDEPENDENT_AMBULATORY_CARE_PROVIDER_SITE_OTHER): Payer: Medicare Other | Admitting: Family Medicine

## 2023-05-07 DIAGNOSIS — M109 Gout, unspecified: Secondary | ICD-10-CM

## 2023-05-07 MED ORDER — PREDNISONE 20 MG PO TABS: ORAL_TABLET | ORAL | 0 refills | Status: DC

## 2023-05-07 NOTE — Progress Notes (Signed)
Patient ID: Todd Barrett., male   DOB: [birth year], 72 y.o.   MRN: [id number]  Virtual Visit via Video Note  I connected with Todd Barrett on 05/07/23 at 11:30 AM EDT by a video enabled telemedicine application and verified that I am speaking with the correct person using two identifiers.  Location patient: home Location provider:work or home office Persons participating in the virtual visit: patient, provider  I discussed the limitations of evaluation and management by telemedicine and the availability of in person appointments. The patient expressed understanding and agreed to proceed.   HPI: Patient seen via virtual visit as a work in with acute gout flare right foot MTP joint.  He states he has not had gout in several years but has had gout flareup similarly in the past with surgery.  He had right total hip replacement last Thursday.  He is having fairly severe pain.  His other chronic problems include hypertension, hyperlipidemia, atrial fibrillation, osteoarthritis involving multiple joints.  He does take Eliquis twice daily.  He is staying well-hydrated.  Denies any fevers or chills.  No history of diabetes.   ROS: See pertinent positives and negatives per HPI.  Past Medical History:  Diagnosis Date   ALLERGIC RHINITIS 01/08/2010   Allergy    dust mites, pet dander   Atrial fibrillation with RVR (HCC) 01/19/2018   CERVICAL RADICULOPATHY, RIGHT 01/08/2010   CHEST PAIN 01/08/2010   DIVERTICULOSIS, COLON 01/08/2010   Dysrhythmia    post op   during PT a fib RVR   GERD (gastroesophageal reflux disease)    Zantac OTC  prn   GOUT 01/08/2010   in past/ when has surgery   HYPERLIPIDEMIA 01/08/2010   Hypertension    Lumbar disc disease 08/05/2011   Osteoarthritis of right knee    end stage   OSTEOARTHRITIS, KNEES, 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    Past Surgical History:  Procedure Laterality Date   ANTERIOR CERVICAL DECOMP/DISCECTOMY FUSION  07/23/2012   Procedure: ANTERIOR  CERVICAL DECOMPRESSION/DISCECTOMY FUSION 2 LEVELS;  Surgeon: Clydene Fake, MD;  Location: [facility]URO ORS;  Service: Neurosurgery;  Laterality: N/A;  Cervical three-four, four-five, redo Cervical six-seven anterior cervical decompression, discectomy fusion with allograft and plating   BACK SURGERY  03/2004, 02/2005   CERVICAL SPINE SURGERY  05/2010   CHOLECYSTECTOMY N/A 01/11/2021   Procedure: LAPAROSCOPIC CHOLECYSTECTOMY;  Surgeon: Axel Filler, MD;  Location: [name] OR;  Service: General;  Laterality: N/A;   clavicle facture  nov. 2009   100% displacement   COLONOSCOPY     ERCP N/A 01/10/2021   Procedure: ENDOSCOPIC RETROGRADE CHOLANGIOPANCREATOGRAPHY (ERCP);  Surgeon: Meryl Dare, MD;  Location: [facility] ENDOSCOPY;  Service: Endoscopy;  Laterality: N/A;   HERNIA REPAIR     inguinal hernia/right side/ 72 years old   JOINT REPLACEMENT     Left knee 2006   KNEE ARTHROSCOPY     total of 5   left knee arthroscopy  september 2005   lumbar disc surgury  02/2002   lumbar disc surgury  june 2004   NASAL RECONSTRUCTION     x 3   REMOVAL OF STONES  01/10/2021   Procedure: REMOVAL OF STONES;  Surgeon: Meryl Dare, MD;  Location[facility] ENDOS[facility];  Service: Endoscopy;;   REVERSE SHOULDER ARTHROPLASTY Left 07/30/2018   Procedure: LEFT REVERSE SHOULDER ARTHROPLASTY;  Surgeon: Francena Hanly, MD;  Location: [facility] OR;  Service: Orthopedics;  Laterality: Left;    s/p ENT surgury     x 3  s/p left shoulder surgury  feb. 2009   s/p left TKR  april 2006   SPHINCTEROTOMY  01/10/2021   Procedure: SPHINCTEROTOMY;  Surgeon: Meryl Dare, MD;  Location: [facility] ENDOSCOPY;  Service: Endoscopy;;   TOTAL HIP ARTHROPLASTY Right 05/01/2023   Procedure: TOTAL HIP ARTHROPLASTY ANTERIOR APPROACH;  Surgeon: Samson Frederic, MD;  Location: [facility] ORS;  Service: Orthopedics;  Laterality: Right;  140   TOTAL KNEE ARTHROPLASTY Right 01/16/2018   Procedure: RIGHT TOTAL KNEE ARTHROPLASTY;  Surgeon: Beverely Low, MD;  Location: [facility] OR;   Service: Orthopedics;  Laterality: Right;   TOTAL SHOULDER ARTHROPLASTY Right 09/16/2019   Procedure: Right Reverse shoulder arthroplasty;  Surgeon: Francena Hanly, MD;  Location: [facility] ORS;  Service: Orthopedics;  Laterality: Right;    Family History  Problem Relation Age of Onset   Bradycardia Father 34       Pacemaker   Lymphoma Mother    Alzheimer's disease Mother    Pancreatic cancer Brother    Colon cancer Neg Hx     SOCIAL HX: Non-smoker   Current Outpatient Medications:    atorvastatin (LIPITOR) 10 MG tablet, TAKE 1 TABLET BY MOUTH EVERY DAY, Disp: 90 tablet, Rfl: 3   Calcium Polycarbophil (FIBER-CAPS PO), Take 2 tablets by mouth in the morning., Disp: , Rfl:    carvedilol (COREG) 3.125 MG tablet, Take 1 tablet (3.125 mg total) by mouth 2 (two) times daily., Disp: 180 tablet, Rfl: 3   Cholecalciferol (VITAMIN D3) 50 MCG (2000 UT) capsule, Take 4,000 Units by mouth daily., Disp: , Rfl:    docusate sodium (COLACE) 100 MG capsule, Take 1 capsule (100 mg total) by mouth 2 (two) times daily., Disp: 60 capsule, Rfl: 0   ELIQUIS 5 MG TABS tablet, Take 1 tablet by mouth twice daily, Disp: 180 tablet, Rfl: 0   fexofenadine (ALLEGRA) 180 MG tablet, Take 180 mg by mouth in the morning and at bedtime., Disp: , Rfl:    fluticasone (FLONASE) 50 MCG/ACT nasal spray, Place 2 sprays into the nose daily as needed for allergies or rhinitis. For allergies., Disp: , Rfl:    HYDROcodone-acetaminophen (NORCO/VICODIN) 5-325 MG tablet, Take 1 tablet by mouth every 4 (four) hours as needed for up to 7 days for moderate pain or severe pain., Disp: 42 tablet, Rfl: 0   lisinopril (ZESTRIL) 10 MG tablet, Take 1 tablet by mouth once daily, Disp: 90 tablet, Rfl: 0   ondansetron (ZOFRAN) 4 MG tablet, Take 1 tablet (4 mg total) by mouth every 8 (eight) hours as needed for nausea or vomiting., Disp: 30 tablet, Rfl: 0   polyethylene glycol (MIRALAX) 17 g packet, Take 17 g by mouth daily as needed for mild constipation  or moderate constipation., Disp: 14 each, Rfl: 0   predniSONE (DELTASONE) 20 MG tablet, Take two tablets by mouth once daily for 5 days., Disp: 10 tablet, Rfl: 0   senna (SENOKOT) 8.6 MG TABS tablet, Take 2 tablets (17.2 mg total) by mouth at bedtime for 15 days., Disp: 30 tablet, Rfl: 0   naproxen sodium (ALEVE) 220 MG tablet, Take 440 mg by mouth 2 (two) times daily as needed (Pain). (Patient not taking: Reported on 05/07/2023), Disp: , Rfl:   EXAM:  VITALS per patient if applicable:  GENERAL: alert, oriented, appears well and in no acute distress  HEENT: atraumatic, conjunttiva clear, no obvious abnormalities on inspection of external nose and ears  NECK: normal movements of the head and neck  LUNGS: on inspection no signs of  respiratory distress, breathing rate appears normal, no obvious gross SOB, gasping or wheezing  CV: no obvious cyanosis  MS: moves all visible extremities without noticeable abnormality  PSYCH/NEURO: pleasant and cooperative, no obvious depression or anxiety, speech and thought processing grossly intact  ASSESSMENT AND PLAN:  Discussed the following assessment and plan:  Acute gout flare involving left foot MTP joint.  He is not a good candidate for indomethacin because of his Eliquis therapy.  He takes carvedilol which can lead to toxic levels of colchicine.  We decided that best option given the above would be brief prednisone treatment with 40 mg once daily for 5 days.  Stay well-hydrated.  Follow-up for any persistent or worsening symptoms.     I discussed the assessment and treatment plan with the patient. The patient was provided an opportunity to ask questions and all were answered. The patient agreed with the plan and demonstrated an understanding of the instructions.   The patient was advised to call back or seek an in-person evaluation if the symptoms worsen or if the condition fails to improve as anticipated.     Evelena Peat, MD

## 2023-05-16 ENCOUNTER — Other Ambulatory Visit: Payer: Self-pay

## 2023-05-16 ENCOUNTER — Other Ambulatory Visit: Payer: Self-pay | Admitting: Internal Medicine

## 2023-05-16 DIAGNOSIS — I480 Paroxysmal atrial fibrillation: Secondary | ICD-10-CM

## 2023-05-16 DIAGNOSIS — Z471 Aftercare following joint replacement surgery: Secondary | ICD-10-CM | POA: Diagnosis not present

## 2023-05-16 DIAGNOSIS — Z96641 Presence of right artificial hip joint: Secondary | ICD-10-CM | POA: Diagnosis not present

## 2023-05-16 DIAGNOSIS — I10 Essential (primary) hypertension: Secondary | ICD-10-CM

## 2023-06-16 DIAGNOSIS — Z96641 Presence of right artificial hip joint: Secondary | ICD-10-CM | POA: Diagnosis not present

## 2023-06-16 DIAGNOSIS — Z471 Aftercare following joint replacement surgery: Secondary | ICD-10-CM | POA: Diagnosis not present

## 2023-07-03 ENCOUNTER — Ambulatory Visit (INDEPENDENT_AMBULATORY_CARE_PROVIDER_SITE_OTHER): Payer: Medicare Other

## 2023-07-03 VITALS — Ht 70.0 in | Wt 205.0 lb

## 2023-07-03 DIAGNOSIS — Z0000 Encounter for general adult medical examination without abnormal findings: Secondary | ICD-10-CM | POA: Diagnosis not present

## 2023-07-03 NOTE — Progress Notes (Signed)
Subjective:   Todd Barrett. is a 72 y.o. male who presents for Medicare Annual/Subsequent preventive examination.  Visit Complete: Virtual  I connected with  [name] on 07/03/23 by a audio enabled telemedicine application and verified that I am speaking with the correct person using two identifiers.  Patient Location: Home  Provider Location: Home Office  I discussed the limitations of evaluation and management by telemedicine. The patient expressed understanding and agreed to proceed.  Patient Medicare AWV questionnaire was completed by the patient on  ; I have confirmed that all information answered by patient is correct and no changes since this date.  Review of Systems    Vital Signs: Unable to obtain new vitals due to this being a telehealth visit.  Cardiac Risk Factors include: advanced age (>42men, >59 women);male gender;hypertension     Objective:    Today's Vitals   07/03/23 1547  Weight: 205 lb (93 kg)  Height: 5\' 10"  (1.778 m)   Body mass index is 29.41 kg/m.     07/03/2023    3:56 PM 05/01/2023   11:32 AM 04/23/2023    9:54 AM 06/24/2022    8:22 AM 04/02/2021    8:49 PM 01/10/2021   10:44 AM 01/06/2021   11:49 PM  Advanced Directives  Does Patient Have a Medical Advance Directive? Yes No No Yes Yes Yes No  Type of Estate agent of [place];Living will   Healthcare Power of [place];Living will Healthcare Power of [place];Living will    Copy of Healthcare Power of Attorney in Chart? No - copy requested   No - copy requested No - copy requested    Would patient like information on creating a medical advance directive?  No - Patient declined     No - Patient declined    Current Medications (verified) Outpatient Encounter Medications as of 07/03/2023  Medication Sig   atorvastatin (LIPITOR) 10 MG tablet Take 1 tablet by mouth once daily   Calcium Polycarbophil (FIBER-CAPS PO) Take 2 tablets by mouth in the morning.    carvedilol (COREG) 3.125 MG tablet Take 1 tablet (3.125 mg total) by mouth 2 (two) times daily.   Cholecalciferol (VITAMIN D3) 50 MCG (2000 UT) capsule Take 4,000 Units by mouth daily.   ELIQUIS 5 MG TABS tablet Take 1 tablet by mouth twice daily   fexofenadine (ALLEGRA) 180 MG tablet Take 180 mg by mouth in the morning and at bedtime.   fluticasone (FLONASE) 50 MCG/ACT nasal spray Place 2 sprays into the nose daily as needed for allergies or rhinitis. For allergies.   lisinopril (ZESTRIL) 10 MG tablet Take 1 tablet by mouth once daily   naproxen sodium (ALEVE) 220 MG tablet Take 440 mg by mouth 2 (two) times daily as needed (Pain). (Patient not taking: Reported on 05/07/2023)   ondansetron (ZOFRAN) 4 MG tablet Take 1 tablet (4 mg total) by mouth every 8 (eight) hours as needed for nausea or vomiting.   predniSONE (DELTASONE) 20 MG tablet Take two tablets by mouth once daily for 5 days.   No facility-administered encounter medications on file as of 07/03/2023.    Allergies (verified) Dust mite extract, Drug ingredient [bee pollen], and Other   History: Past Medical History:  Diagnosis Date   ALLERGIC RHINITIS 01/08/2010   Allergy    dust mites, pet dander   Atrial fibrillation with RVR (HCC) 01/19/2018   CERVICAL RADICULOPATHY, RIGHT 01/08/2010   CHEST PAIN 01/08/2010   DIVERTICULOSIS, COLON  01/08/2010   Dysrhythmia    post op   during PT a fib RVR   GERD (gastroesophageal reflux disease)    Zantac OTC  prn   GOUT 01/08/2010   in past/ when has surgery   HYPERLIPIDEMIA 01/08/2010   Hypertension    Lumbar disc disease 08/05/2011   Osteoarthritis of right knee    end stage   OSTEOARTHRITIS, KNEES, 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   Past Surgical History:  Procedure Laterality Date   ANTERIOR CERVICAL DECOMP/DISCECTOMY FUSION  07/23/2012   Procedure: ANTERIOR CERVICAL DECOMPRESSION/DISCECTOMY FUSION 2 LEVELS;  Surgeon: Clydene Fake, MD;  Location: [facility] OR[facility];  Service: Neurosurgery;   Laterality: N/A;  Cervical three-four, four-five, redo Cervical six-seven anterior cervical decompression, discectomy fusion with allograft and plating   BACK SURGERY  03/2004, 02/2005   CERVICAL SPINE SURGERY  05/2010   CHOLECYSTECTOMY N/A 01/11/2021   Procedure: LAPAROSCOPIC CHOLECYSTECTOMY;  Surgeon: Axel Filler, MD;  Location: [facility] OR;  Service: General;  Laterality: N/A;   clavicle facture  nov. 2009   100% displacement   COLONOSCOPY     ERCP N/A 01/10/2021   Procedure: ENDOSCOPIC RETROGRADE CHOLANGIOPANCREATOGRAPHY (ERCP);  Surgeon: Meryl Dare, MD;  Location: [facility] ENDOSCOPY;  Service: Endoscopy;  Laterality: N/A;   HERNIA REPAIR     inguinal hernia/right side/ 72 years old   JOINT REPLACEMENT     Left knee 2006   KNEE ARTHROSCOPY     total of 5   left knee arthroscopy  september 2005   lumbar disc surgury  02/2002   lumbar disc surgury  june 2004   NASAL RECONSTRUCTION     x 3   REMOVAL OF STONES  01/10/2021   Procedure: REMOVAL OF STONES;  Surgeon: Meryl Dare, MD;  Location: [facility] ENDOSCOPY;  Service: Endoscopy;;   REVERSE SHOULDER ARTHROPLASTY Left 07/30/2018   Procedure: LEFT REVERSE SHOULDER ARTHROPLASTY;  Surgeon: Francena Hanly, MD;  Location: [facility] OR;  Service: Orthopedics;  Laterality: Left;    s/p ENT surgury     x 3   s/p left shoulder surgury  feb. 2009   s/p left TKR  april 2006   SPHINCTEROTOMY  01/10/2021   Procedure: SPHINCTEROTOMY;  Surgeon: Meryl Dare, MD;  Location: [facility] ENDOSCOPY;  Service: Endoscopy;;   TOTAL HIP ARTHROPLASTY Right 05/01/2023   Procedure: TOTAL HIP ARTHROPLASTY ANTERIOR APPROACH;  Surgeon: Samson Frederic, MD;  Location: [facility] ORS;  Service: Orthopedics;  Laterality: Right;  140   TOTAL KNEE ARTHROPLASTY Right 01/16/2018   Procedure: RIGHT TOTAL KNEE ARTHROPLASTY;  Surgeon: Beverely Low, MD;  Location: [facility] OR;  Service: Orthopedics;  Laterality: Right;   TOTAL SHOULDER ARTHROPLASTY Right 09/16/2019   Procedure: Right Reverse shoulder  arthroplasty;  Surgeon: Francena Hanly, MD;  Location: [facility] ORS;  Service: Orthopedics;  Laterality: Right;   Family History  Problem Relation Age of Onset   Bradycardia Father 29       Pacemaker   Lymphoma Mother    Alzheimer's disease Mother    Pancreatic cancer Brother    Colon cancer Neg Hx    Social History   Socioeconomic History   Marital status: Married    Spouse name: Not on file   Number of children: Not on file   Years of education: Not on file   Highest education level: Not on file  Occupational History   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 [place]    Employer: [employer]  Tobacco Use   Smoking status: Never   Smokeless  tobacco: Never  Vaping Use   Vaping status: Never Used  Substance and Sexual Activity   Alcohol use: Yes    Alcohol/week: 12.0 standard drinks of alcohol    Types: 12 Cans of beer per week    Comment: occ beer   Drug use: Never   Sexual activity: Yes  Other Topics Concern   Not on file  Social History Narrative   Not on file   Social Determinants of Health   Financial Resource Strain: Low Risk  (07/03/2023)   Overall Financial Resource Strain (CARDIA)    Difficulty of Paying Living Expenses: Not hard at all  Food Insecurity: No Food Insecurity (07/03/2023)   Hunger Vital Sign    Worried About Running Out of Food in the Last Year: Never true    Ran Out of Food in the Last Year: Never true  Transportation Needs: No Transportation Needs (07/03/2023)   PRAPARE - Administrator, Civil Service (Medical): No    Lack of Transportation (Non-Medical): No  Physical Activity: Sufficiently Active (07/03/2023)   Exercise Vital Sign    Days of Exercise per Week: 7 days    Minutes of Exercise per Session: 60 min  Stress: No Stress Concern Present (07/03/2023)   [employer] of Occupational Health - Occupational Stress Questionnaire    Feeling of Stress : Not at all  Social Connections: Moderately Isolated (07/03/2023)   Social Connection and Isolation  Panel [NHANES]    Frequency of Communication with Friends and Family: More than three times a week    Frequency of Social Gatherings with Friends and Family: More than three times a week    Attends Religious Services: Never    Database administrator or Organizations: No    Attends Engineer, structural: Never    Marital Status: Married    Tobacco Counseling Counseling given: Not Answered   Clinical Intake:  Pre-visit preparation completed: No  Pain : No/denies pain     BMI - recorded: 29.41 Nutritional Status: BMI 25 -29 Overweight Nutritional Risks: None Diabetes: No  How often do you need to have someone help you when you read instructions, pamphlets, or other written materials from your doctor or pharmacy?: 1 - Never  Interpreter Needed?: No  Information entered by :: Theresa Mulligan LPN   Activities of Daily Living    07/03/2023    3:54 PM 05/01/2023    6:00 PM  In your present state of health, do you have any difficulty performing the following activities:  Hearing? 0   Vision? 0   Difficulty concentrating or making decisions? 0   Walking or climbing stairs? 0   Dressing or bathing? 0   Doing errands, shopping? 0 0  Preparing Food and eating ? N   Using the Toilet? N   In the past six months, have you accidently leaked urine? N   Do you have problems with loss of bowel control? N   Managing your Medications? N   Managing your Finances? N   Housekeeping or managing your Housekeeping? N     Patient Care Team: Corwin Levins, MD as PCP - General Eldridge Dace Donnie Coffin, MD as PCP - Cardiology (Cardiology) Sinda Du, MD as Consulting Physician (Ophthalmology)  Indicate any recent Medical Services you may have received from other than [facility] providers in the past year (date may be approximate).     Assessment:   This is a routine wellness examination for Todd Barrett.  Hearing/Vision screen Hearing  Screening - Comments:: Denies hearing difficulties    Vision Screening - Comments:: Wears rx glasses - up to date with routine eye exams with  [facility]  Dietary issues and exercise activities discussed:     Goals Addressed               This Visit's Progress     Quit Working (pt-stated)         Depression Screen    07/03/2023    3:53 PM 12/24/2022   10:29 AM 12/24/2022   10:02 AM 06/24/2022    8:23 AM 06/24/2022    8:21 AM 12/21/2021    9:37 AM 12/21/2021    9:06 AM  PHQ 2/9 Scores  PHQ - 2 Score 0 0 2 0 0 0 0  PHQ- 9 Score   6        Fall Risk    07/03/2023    3:54 PM 12/24/2022   10:28 AM 12/24/2022   10:03 AM 06/24/2022    8:23 AM 12/21/2021    9:37 AM  Fall Risk   Falls in the past year? 1 1 1 1 1   Number falls in past yr: 0 1 1 1  0  Injury with Fall? 0 0 0 0 0  Risk for fall due to : Impaired balance/gait;Orthopedic patient  Impaired mobility Impaired mobility   Risk for fall due to: Comment    knee replacements   Follow up Falls prevention discussed  Falls evaluation completed Falls evaluation completed;Education provided     MEDICARE RISK AT HOME:  Medicare Risk at Home - 07/03/23 1559     Any stairs in or around the home? Yes    If so, are there any without handrails? No    Home free of loose throw rugs in walkways, pet beds, electrical cords, etc? Yes    Adequate lighting in your home to reduce risk of falls? Yes    Life alert? No    Use of a cane, walker or w/c? No    Grab bars in the bathroom? Yes    Shower chair or bench in shower? Yes    Elevated toilet seat or a handicapped toilet? Yes             TIMED UP AND GO:  Was the test performed?  No    Cognitive Function:        07/03/2023    3:56 PM 06/24/2022    8:27 AM  6CIT Screen  What Year? 0 points 0 points  What month? 0 points 0 points  What time? 0 points 0 points  Count back from 20 0 points 0 points  Months in reverse 0 points 0 points  Repeat phrase 0 points 0 points  Total Score 0 points 0 points     Immunizations Immunization History  Administered Date(s) Administered   Fluad Quad(high Dose 65+) 12/21/2019   Influenza Split 10/05/2012   Influenza Whole 08/05/2011   Influenza, High Dose Seasonal PF 12/16/2017, 07/31/2018   Influenza,inj,Quad PF,6+ Mos 11/24/2013, 11/29/2014, 12/01/2015, 12/04/2016   Influenza,inj,quad, With Preservative 11/26/2018   Influenza-Unspecified 12/20/2020, 10/10/2021, 10/21/2022   PFIZER Comirnaty(Gray Top)Covid-19 Tri-Sucrose Vaccine 01/17/2020, 02/14/2020, 08/07/2020   PFIZER(Purple Top)SARS-COV-2 Vaccination 10/21/2022   Pneumococcal Conjugate-13 12/15/2015   Pneumococcal Polysaccharide-23 12/04/2016   Td 12/27/2007   Tdap 12/17/2018    TDAP status: Up to date  Flu Vaccine status: Due, Education has been provided regarding the importance of this vaccine. Advised may receive this vaccine at local  pharmacy or Health Dept. Aware to provide a copy of the vaccination record if obtained from local pharmacy or Health Dept. Verbalized acceptance and understanding.  Pneumococcal vaccine status: Up to date  Covid-19 vaccine status: Declined, Education has been provided regarding the importance of this vaccine but patient still declined. Advised may receive this vaccine at local pharmacy or Health Dept.or vaccine clinic. Aware to provide a copy of the vaccination record if obtained from local pharmacy or Health Dept. Verbalized acceptance and understanding.  Qualifies for Shingles Vaccine? Yes   Zostavax completed No   Shingrix Completed?: No.    Education has been provided regarding the importance of this vaccine. Patient has been advised to call insurance company to determine out of pocket expense if they have not yet received this vaccine. Advised may also receive vaccine at local pharmacy or Health Dept. Verbalized acceptance and understanding.  Screening Tests Health Maintenance  Topic Date Due   Zoster Vaccines- Shingrix (1 of 2) Never done    OPHTHALMOLOGY EXAM  02/12/2022   COVID-19 Vaccine (5 - 2023-24 season) 12/16/2022   HEMOGLOBIN A1C  06/24/2023   INFLUENZA VACCINE  06/26/2023   Diabetic kidney evaluation - Urine ACR  12/25/2023   FOOT EXAM  12/25/2023   Diabetic kidney evaluation - eGFR measurement  05/01/2024   Medicare Annual Wellness (AWV)  07/02/2024   Colonoscopy  03/05/2027   DTaP/Tdap/Td (3 - Td or Tdap) 12/17/2028   Pneumonia Vaccine 72+ Years old  Completed   Hepatitis C Screening  Completed   HPV VACCINES  Aged Out    Health Maintenance  Health Maintenance Due  Topic Date Due   Zoster Vaccines- Shingrix (1 of 2) Never done   OPHTHALMOLOGY EXAM  02/12/2022   COVID-19 Vaccine (5 - 2023-24 season) 12/16/2022   HEMOGLOBIN A1C  06/24/2023   INFLUENZA VACCINE  06/26/2023    Colorectal cancer screening: Type of screening: Colonoscopy. Completed 03/04/17. Repeat every 10 years  Lung Cancer Screening: (Low Dose CT Chest recommended if Age 2-80 years, 20 pack-year currently smoking OR have quit w/in 15years.) does not qualify.     Additional Screening:  Hepatitis C Screening: does qualify; Completed 12/15/15  Vision Screening: Recommended annual ophthalmology exams for early detection of glaucoma and other disorders of the eye. Is the patient up to date with their annual eye exam?  Yes  Who is the provider or what is the name of the office in which the patient attends annual eye exams? [facility] If pt is not established with a provider, would they like to be referred to a provider to establish care? No .   Dental Screening: Recommended annual dental exams for proper oral hygiene   Community Resource Referral / Chronic Care Management:  CRR required this visit?  No   CCM required this visit?  No     Plan:     I have personally reviewed and noted the following in the patient's chart:   Medical and social history Use of alcohol, tobacco or illicit drugs  Current medications and  supplements including opioid prescriptions. Patient is not currently taking opioid prescriptions. Functional ability and status Nutritional status Physical activity Advanced directives List of other physicians Hospitalizations, surgeries, and ER visits in previous 12 months Vitals Screenings to include cognitive, depression, and falls Referrals and appointments  In addition, I have reviewed and discussed with patient certain preventive protocols, quality metrics, and best practice recommendations. A written personalized care plan for preventive services as well as  general preventive health recommendations were provided to patient.     Tillie Rung, LPN   11/30/1094   After Visit Summary: (MyChart) Due to this being a telephonic visit, the after visit summary with patients personalized plan was offered to patient via MyChart   Nurse Notes: None

## 2023-07-03 NOTE — Patient Instructions (Addendum)
[name] , Thank you for taking time to come for your Medicare Wellness Visit. I appreciate your ongoing commitment to your health goals. Please review the following plan we discussed and let me know if I can assist you in the future.   Referrals/Orders/Follow-Ups/Clinician Recommendations:   This is a list of the screening recommended for you and due dates:  Health Maintenance  Topic Date Due   Zoster (Shingles) Vaccine (1 of 2) Never done   Eye exam for diabetics  02/12/2022   COVID-19 Vaccine (5 - 2023-24 season) 12/16/2022   Hemoglobin A1C  06/24/2023   Flu Shot  06/26/2023   Yearly kidney health urinalysis for diabetes  12/25/2023   Complete foot exam   12/25/2023   Yearly kidney function blood test for diabetes  05/01/2024   Medicare Annual Wellness Visit  07/02/2024   Colon Cancer Screening  03/05/2027   DTaP/Tdap/Td vaccine (3 - Td or Tdap) 12/17/2028   Pneumonia Vaccine  Completed   Hepatitis C Screening  Completed   HPV Vaccine  Aged Out    Advanced directives: (Copy Requested) Please bring a copy of your health care power of attorney and living will to the office to be added to your chart at your convenience.  Next Medicare Annual Wellness Visit scheduled for next year: Yes  Preventive Care 40 Years and Older, Male  Preventive care refers to lifestyle choices and visits with your health care provider that can promote health and wellness. What does preventive care include? A yearly physical exam. This is also called an annual well check. Dental exams once or twice a year. Routine eye exams. Ask your health care provider how often you should have your eyes checked. Personal lifestyle choices, including: Daily care of your teeth and gums. Regular physical activity. Eating a healthy diet. Avoiding tobacco and drug use. Limiting alcohol use. Practicing safe sex. Taking low doses of aspirin every day. Taking vitamin and mineral supplements as recommended by your  health care provider. What happens during an annual well check? The services and screenings done by your health care provider during your annual well check will depend on your age, overall health, lifestyle risk factors, and family history of disease. Counseling  Your health care provider may ask you questions about your: Alcohol use. Tobacco use. Drug use. Emotional well-being. Home and relationship well-being. Sexual activity. Eating habits. History of falls. Memory and ability to understand (cognition). Work and work Astronomer. Screening  You may have the following tests or measurements: Height, weight, and BMI. Blood pressure. Lipid and cholesterol levels. These may be checked every 5 years, or more frequently if you are over 57 years old. Skin check. Lung cancer screening. You may have this screening every year starting at age 47 if you have a 30-pack-year history of smoking and currently smoke or have quit within the past 15 years. Fecal occult blood test (FOBT) of the stool. You may have this test every year starting at age 54. Flexible sigmoidoscopy or colonoscopy. You may have a sigmoidoscopy every 5 years or a colonoscopy every 10 years starting at age 47. Prostate cancer screening. Recommendations will vary depending on your family history and other risks. Hepatitis C blood test. Hepatitis B blood test. Sexually transmitted disease (STD) testing. Diabetes screening. This is done by checking your blood sugar (glucose) after you have not eaten for a while (fasting). You may have this done every 1-3 years. Abdominal aortic aneurysm (AAA) screening. You may need this if you  are a current or former smoker. Osteoporosis. You may be screened starting at age 61 if you are at high risk. Talk with your health care provider about your test results, treatment options, and if necessary, the need for more tests. Vaccines  Your health care provider may recommend certain vaccines, such  as: Influenza vaccine. This is recommended every year. Tetanus, diphtheria, and acellular pertussis (Tdap, Td) vaccine. You may need a Td booster every 10 years. Zoster vaccine. You may need this after age 67. Pneumococcal 13-valent conjugate (PCV13) vaccine. One dose is recommended after age 50. Pneumococcal polysaccharide (PPSV23) vaccine. One dose is recommended after age 71. Talk to your health care provider about which screenings and vaccines you need and how often you need them. This information is not intended to replace advice given to you by your health care provider. Make sure you discuss any questions you have with your health care provider. Document Released: 12/08/2015 Document Revised: 07/31/2016 Document Reviewed: 09/12/2015 Elsevier Interactive Patient Education  2017 [employer].  Fall Prevention in the Home Falls can cause injuries. They can happen to people of all ages. There are many things you can do to make your home safe and to help prevent falls. What can I do on the outside of my home? Regularly fix the edges of walkways and driveways and fix any cracks. Remove anything that might make you trip as you walk through a door, such as a raised step or threshold. Trim any bushes or trees on the path to your home. Use bright outdoor lighting. Clear any walking paths of anything that might make someone trip, such as rocks or tools. Regularly check to see if handrails are loose or broken. Make sure that both sides of any steps have handrails. Any raised decks and porches should have guardrails on the edges. Have any leaves, snow, or ice cleared regularly. Use sand or salt on walking paths during winter. Clean up any spills in your garage right away. This includes oil or grease spills. What can I do in the bathroom? Use night lights. Install grab bars by the toilet and in the tub and shower. Do not use towel bars as grab bars. Use non-skid mats or decals in the tub or  shower. If you need to sit down in the shower, use a plastic, non-slip stool. Keep the floor dry. Clean up any water that spills on the floor as soon as it happens. Remove soap buildup in the tub or shower regularly. Attach bath mats securely with double-sided non-slip rug tape. Do not have throw rugs and other things on the floor that can make you trip. What can I do in the bedroom? Use night lights. Make sure that you have a light by your bed that is easy to reach. Do not use any sheets or blankets that are too big for your bed. They should not hang down onto the floor. Have a firm chair that has side arms. You can use this for support while you get dressed. Do not have throw rugs and other things on the floor that can make you trip. What can I do in the kitchen? Clean up any spills right away. Avoid walking on wet floors. Keep items that you use a lot in easy-to-reach places. If you need to reach something above you, use a strong step stool that has a grab bar. Keep electrical cords out of the way. Do not use floor polish or wax that makes floors slippery. If you  must use wax, use non-skid floor wax. Do not have throw rugs and other things on the floor that can make you trip. What can I do with my stairs? Do not leave any items on the stairs. Make sure that there are handrails on both sides of the stairs and use them. Fix handrails that are broken or loose. Make sure that handrails are as long as the stairways. Check any carpeting to make sure that it is firmly attached to the stairs. Fix any carpet that is loose or worn. Avoid having throw rugs at the top or bottom of the stairs. If you do have throw rugs, attach them to the floor with carpet tape. Make sure that you have a light switch at the top of the stairs and the bottom of the stairs. If you do not have them, ask someone to add them for you. What else can I do to help prevent falls? Wear shoes that: Do not have high heels. Have  rubber bottoms. Are comfortable and fit you well. Are closed at the toe. Do not wear sandals. If you use a stepladder: Make sure that it is fully opened. Do not climb a closed stepladder. Make sure that both sides of the stepladder are locked into place. Ask someone to hold it for you, if possible. Clearly mark and make sure that you can see: Any grab bars or handrails. First and last steps. Where the edge of each step is. Use tools that help you move around (mobility aids) if they are needed. These include: Canes. Walkers. Scooters. Crutches. Turn on the lights when you go into a dark area. Replace any light bulbs as soon as they burn out. Set up your furniture so you have a clear path. Avoid moving your furniture around. If any of your floors are uneven, fix them. If there are any pets around you, be aware of where they are. Review your medicines with your doctor. Some medicines can make you feel dizzy. This can increase your chance of falling. Ask your doctor what other things that you can do to help prevent falls. This information is not intended to replace advice given to you by your health care provider. Make sure you discuss any questions you have with your health care provider. Document Released: 09/07/2009 Document Revised: 04/18/2016 Document Reviewed: 12/16/2014 Elsevier Interactive Patient Education  2017 [employer].

## 2023-08-18 ENCOUNTER — Other Ambulatory Visit: Payer: Self-pay | Admitting: Internal Medicine

## 2023-08-18 ENCOUNTER — Other Ambulatory Visit: Payer: Self-pay

## 2023-08-18 DIAGNOSIS — I480 Paroxysmal atrial fibrillation: Secondary | ICD-10-CM

## 2023-08-18 DIAGNOSIS — I10 Essential (primary) hypertension: Secondary | ICD-10-CM

## 2023-08-18 DIAGNOSIS — Z471 Aftercare following joint replacement surgery: Secondary | ICD-10-CM | POA: Diagnosis not present

## 2023-08-18 DIAGNOSIS — Z96641 Presence of right artificial hip joint: Secondary | ICD-10-CM | POA: Diagnosis not present

## 2023-08-28 ENCOUNTER — Other Ambulatory Visit: Payer: Self-pay | Admitting: Internal Medicine

## 2023-08-28 ENCOUNTER — Other Ambulatory Visit: Payer: Self-pay

## 2023-10-13 DIAGNOSIS — Z23 Encounter for immunization: Secondary | ICD-10-CM | POA: Diagnosis not present

## 2023-11-14 DIAGNOSIS — Z96641 Presence of right artificial hip joint: Secondary | ICD-10-CM | POA: Diagnosis not present

## 2024-01-01 ENCOUNTER — Encounter: Payer: Self-pay | Admitting: Internal Medicine

## 2024-01-01 ENCOUNTER — Ambulatory Visit: Payer: Medicare Other | Admitting: Internal Medicine

## 2024-01-01 VITALS — BP 110/64 | HR 64 | Temp 98.0°F | Ht 70.0 in | Wt 222.0 lb

## 2024-01-01 DIAGNOSIS — I480 Paroxysmal atrial fibrillation: Secondary | ICD-10-CM

## 2024-01-01 DIAGNOSIS — I10 Essential (primary) hypertension: Secondary | ICD-10-CM | POA: Diagnosis not present

## 2024-01-01 DIAGNOSIS — E538 Deficiency of other specified B group vitamins: Secondary | ICD-10-CM

## 2024-01-01 DIAGNOSIS — N320 Bladder-neck obstruction: Secondary | ICD-10-CM | POA: Diagnosis not present

## 2024-01-01 DIAGNOSIS — E559 Vitamin D deficiency, unspecified: Secondary | ICD-10-CM

## 2024-01-01 DIAGNOSIS — E7800 Pure hypercholesterolemia, unspecified: Secondary | ICD-10-CM

## 2024-01-01 DIAGNOSIS — R739 Hyperglycemia, unspecified: Secondary | ICD-10-CM | POA: Diagnosis not present

## 2024-01-01 DIAGNOSIS — J309 Allergic rhinitis, unspecified: Secondary | ICD-10-CM | POA: Diagnosis not present

## 2024-01-01 DIAGNOSIS — Z125 Encounter for screening for malignant neoplasm of prostate: Secondary | ICD-10-CM

## 2024-01-01 LAB — URINALYSIS, ROUTINE W REFLEX MICROSCOPIC
Bilirubin Urine: NEGATIVE
Ketones, ur: NEGATIVE
Leukocytes,Ua: NEGATIVE
Nitrite: NEGATIVE
Specific Gravity, Urine: 1.01 (ref 1.000–1.030)
Total Protein, Urine: NEGATIVE
Urine Glucose: NEGATIVE
Urobilinogen, UA: 0.2 (ref 0.0–1.0)
pH: 6 (ref 5.0–8.0)

## 2024-01-01 LAB — HEMOGLOBIN A1C: Hgb A1c MFr Bld: 5.6 % (ref 4.6–6.5)

## 2024-01-01 LAB — VITAMIN B12: Vitamin B-12: 352 pg/mL (ref 211–911)

## 2024-01-01 LAB — LIPID PANEL
Cholesterol: 138 mg/dL (ref 0–200)
HDL: 63.6 mg/dL (ref >39.00)
LDL Cholesterol: 65 mg/dL (ref 0–99)
NonHDL: 74.28
Total CHOL/HDL Ratio: 2
Triglycerides: 46 mg/dL (ref 0.0–149.0)
VLDL: 9.2 mg/dL (ref 0.0–40.0)

## 2024-01-01 LAB — CBC WITH DIFFERENTIAL/PLATELET
Basophils Absolute: 0 10*3/uL (ref 0.0–0.1)
Basophils Relative: 0.5 % (ref 0.0–3.0)
Eosinophils Absolute: 0.1 10*3/uL (ref 0.0–0.7)
Eosinophils Relative: 1.8 % (ref 0.0–5.0)
HCT: 45.4 % (ref 39.0–52.0)
Hemoglobin: 15.1 g/dL (ref 13.0–17.0)
Lymphocytes Relative: 27.4 % (ref 12.0–46.0)
Lymphs Abs: 1.6 10*3/uL (ref 0.7–4.0)
MCHC: 33.3 g/dL (ref 30.0–36.0)
MCV: 93.1 fL (ref 78.0–100.0)
Monocytes Absolute: 0.5 10*3/uL (ref 0.1–1.0)
Monocytes Relative: 8.9 % (ref 3.0–12.0)
Neutro Abs: 3.5 10*3/uL (ref 1.4–7.7)
Neutrophils Relative %: 61.4 % (ref 43.0–77.0)
Platelets: 197 10*3/uL (ref 150.0–400.0)
RBC: 4.88 Mil/uL (ref 4.22–5.81)
RDW: 13.8 % (ref 11.5–15.5)
WBC: 5.7 10*3/uL (ref 4.0–10.5)

## 2024-01-01 LAB — BASIC METABOLIC PANEL
BUN: 21 mg/dL (ref 6–23)
CO2: 29 meq/L (ref 19–32)
Calcium: 9.7 mg/dL (ref 8.4–10.5)
Chloride: 104 meq/L (ref 96–112)
Creatinine, Ser: 1.11 mg/dL (ref 0.40–1.50)
GFR: 66.04 mL/min (ref >60.00)
Glucose, Bld: 102 mg/dL — ABNORMAL HIGH (ref 70–99)
Potassium: 5.1 meq/L (ref 3.5–5.1)
Sodium: 140 meq/L (ref 135–145)

## 2024-01-01 LAB — MICROALBUMIN / CREATININE URINE RATIO
Creatinine,U: 44.5 mg/dL
Microalb Creat Ratio: 1.6 mg/g (ref 0.0–30.0)
Microalb, Ur: <0.7 mg/dL (ref 0.0–1.9)

## 2024-01-01 LAB — HEPATIC FUNCTION PANEL
ALT: 25 U/L (ref 0–53)
AST: 27 U/L (ref 0–37)
Albumin: 4.5 g/dL (ref 3.5–5.2)
Alkaline Phosphatase: 56 U/L (ref 39–117)
Bilirubin, Direct: 0.2 mg/dL (ref 0.0–0.3)
Total Bilirubin: 0.7 mg/dL (ref 0.2–1.2)
Total Protein: 6.6 g/dL (ref 6.0–8.3)

## 2024-01-01 LAB — PSA: PSA: 0.76 ng/mL (ref 0.10–4.00)

## 2024-01-01 LAB — TSH: TSH: 2.62 u[IU]/mL (ref 0.35–5.50)

## 2024-01-01 LAB — VITAMIN D 25 HYDROXY (VIT D DEFICIENCY, FRACTURES): VITD: 46.37 ng/mL (ref 30.00–100.00)

## 2024-01-01 MED ORDER — ATORVASTATIN CALCIUM 10 MG PO TABS: 10.0000 mg | ORAL_TABLET | Freq: Every day | ORAL | 3 refills | Status: AC

## 2024-01-01 MED ORDER — LISINOPRIL 10 MG PO TABS
10.0000 mg | ORAL_TABLET | Freq: Every day | ORAL | 3 refills | Status: AC
Start: 2024-01-01 — End: ?

## 2024-01-01 MED ORDER — CARVEDILOL 3.125 MG PO TABS: 3.1250 mg | ORAL_TABLET | Freq: Two times a day (BID) | ORAL | 3 refills | Status: AC

## 2024-01-01 MED ORDER — APIXABAN 5 MG PO TABS: 5.0000 mg | ORAL_TABLET | Freq: Two times a day (BID) | ORAL | 3 refills | Status: AC

## 2024-01-01 NOTE — Progress Notes (Signed)
 Patient ID: Todd Barrett., male   DOB: 08-18-1951, 73 y.o.   MRN: [id number]         Chief Complaint:: yearly exam with hypergylcemia, hld, htn, allergies       HPI:  Todd Barrett. is a 73 y.o. male here overall doing ok,  Pt denies chest pain, increased sob or doe, wheezing, orthopnea, PND, increased LE swelling, palpitations, dizziness or syncope.  Pt denies polydipsia, polyuria, or new focal neuro s/s.    Pt denies fever, wt loss, night sweats, loss of appetite, or other constitutional symptoms   S/p right hip surgury June 2024 THR, tolerated well but has reduced ROM to right knee now and subsequent pain to the lower back, less active.  Does have several wks ongoing nasal allergy symptoms with clearish congestion, itch and sneezing, without fever, pain, ST, cough, swelling or wheezing.  Wt Readings from Last 3 Encounters:  01/01/24 222 lb (100.7 kg)  07/03/23 205 lb (93 kg)  05/01/23 214 lb (97.1 kg)   BP Readings from Last 3 Encounters:  01/01/24 110/64  05/02/23 (!) 142/98  04/23/23 (!) 137/94   Immunization History  Administered Date(s) Administered   Fluad Quad(high Dose 65+) 12/21/2019, 10/15/2023   Influenza Split 10/05/2012   Influenza Whole 08/05/2011   Influenza, High Dose Seasonal PF 12/16/2017, 07/31/2018   Influenza,inj,Quad PF,6+ Mos 11/24/2013, 11/29/2014, 12/01/2015, 12/04/2016   Influenza,inj,quad, With Preservative 11/26/2018   Influenza-Unspecified 12/20/2020, 10/10/2021, 10/21/2022   PFIZER Comirnaty(Gray Top)Covid-19 Tri-Sucrose Vaccine 01/17/2020, 02/14/2020, 08/07/2020   PFIZER(Purple Top)SARS-COV-2 Vaccination 10/21/2022   Pneumococcal Conjugate-13 12/15/2015   Pneumococcal Polysaccharide-23 12/04/2016   Td 12/27/2007   Tdap 12/17/2018   Health Maintenance Due  Topic Date Due   Zoster Vaccines- Shingrix (1 of 2) Never done      Past Medical History:  Diagnosis Date   ALLERGIC RHINITIS 01/08/2010   Allergy    dust mites, pet dander    Atrial fibrillation with RVR (HCC) 01/19/2018   CERVICAL RADICULOPATHY, RIGHT 01/08/2010   CHEST PAIN 01/08/2010   DIVERTICULOSIS, COLON 01/08/2010   Dysrhythmia    post op   during PT a fib RVR   GERD (gastroesophageal reflux disease)    Zantac OTC  prn   GOUT 01/08/2010   in past/ when has surgery   HYPERLIPIDEMIA 01/08/2010   Hypertension    Lumbar disc disease 08/05/2011   Osteoarthritis of right knee    end stage   OSTEOARTHRITIS, KNEES, 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   Past Surgical History:  Procedure Laterality Date   ANTERIOR CERVICAL DECOMP/DISCECTOMY FUSION  07/23/2012   Procedure: ANTERIOR CERVICAL DECOMPRESSION/DISCECTOMY FUSION 2 LEVELS;  Surgeon: Lynwood JONELLE Mill, MD;  Location: [facility] OR[facility];  Service: Neurosurgery;  Laterality: N/A;  Cervical three-four, four-five, redo Cervical six-seven anterior cervical decompression, discectomy fusion with allograft and plating   BACK SURGERY  03/2004, 02/2005   CERVICAL SPINE SURGERY  05/2010   CHOLECYSTECTOMY N/A 01/11/2021   Procedure: LAPAROSCOPIC CHOLECYSTECTOMY;  Surgeon: Rubin Calamity, MD;  Location: [facility] OR;  Service: General;  Laterality: N/A;   clavicle facture  nov. 2009   100% displacement   COLONOSCOPY     ERCP N/A 01/10/2021   Procedure: ENDOSCOPIC RETROGRADE CHOLANGIOPANCREATOGRAPHY (ERCP);  Surgeon: Aneita Gwendlyn DASEN, MD;  Location: [facility] ENDOSCOPY;  Service: Endoscopy;  Laterality: N/A;   HERNIA REPAIR     inguinal hernia/right side/ 73 years old   JOINT REPLACEMENT     Left knee 2006   KNEE ARTHROSCOPY  total of 5   left knee arthroscopy  september 2005   lumbar disc surgury  02/2002   lumbar disc surgury  june 2004   NASAL RECONSTRUCTION     x 3   REMOVAL OF STONES  01/10/2021   Procedure: REMOVAL OF STONES;  Surgeon: Aneita Gwendlyn DASEN, MD;  Location: [facility] ENDOSCOPY;  Service: Endoscopy;;   REVERSE SHOULDER ARTHROPLASTY Left 07/30/2018   Procedure: LEFT REVERSE SHOULDER ARTHROPLASTY;  Surgeon: Melita Drivers, MD;  Location:  [facility] OR;  Service: Orthopedics;  Laterality: Left;    s/p ENT surgury     x 3   s/p left shoulder surgury  feb. 2009   s/p left TKR  april 2006   SPHINCTEROTOMY  01/10/2021   Procedure: SPHINCTEROTOMY;  Surgeon: Aneita Gwendlyn DASEN, MD;  Location: [facility] ENDOSCOPY;  Service: Endoscopy;;   TOTAL HIP ARTHROPLASTY Right 05/01/2023   Procedure: TOTAL HIP ARTHROPLASTY ANTERIOR APPROACH;  Surgeon: Fidel Rogue, MD;  Location: [facility] ORS;  Service: Orthopedics;  Laterality: Right;  140   TOTAL KNEE ARTHROPLASTY Right 01/16/2018   Procedure: RIGHT TOTAL KNEE ARTHROPLASTY;  Surgeon: Kay Kemps, MD;  Location: [facility] OR;  Service: Orthopedics;  Laterality: Right;   TOTAL SHOULDER ARTHROPLASTY Right 09/16/2019   Procedure: Right Reverse shoulder arthroplasty;  Surgeon: Melita Drivers, MD;  Location: [facility] ORS;  Service: Orthopedics;  Laterality: Right;    reports that he has never smoked. He has never used smokeless tobacco. He reports current alcohol  use of about 12.0 standard drinks of alcohol  per week. He reports that he does not use drugs. family history includes Alzheimer's disease in his mother; Bradycardia (age of onset: 37) in his father; Lymphoma in his mother; Pancreatic cancer in his brother. Allergies  Allergen Reactions   Dust Mite Extract Other (See Comments)    Nasal Congestion   Drug Ingredient [Bee Pollen] Other (See Comments)    Congestion   Other Other (See Comments)    Pet Dander- Runny nose   Current Outpatient Medications on File Prior to Visit  Medication Sig Dispense Refill   Calcium  Polycarbophil (FIBER-CAPS PO) Take 2 tablets by mouth in the morning.     Cholecalciferol (VITAMIN D3) 50 MCG (2000 UT) capsule Take 4,000 Units by mouth daily.     fexofenadine (ALLEGRA) 180 MG tablet Take 180 mg by mouth in the morning and at bedtime.     fluticasone  (FLONASE ) 50 MCG/ACT nasal spray Place 2 sprays into the nose daily as needed for allergies or rhinitis. For allergies.     naproxen   sodium (ALEVE ) 220 MG tablet Take 440 mg by mouth 2 (two) times daily as needed (Pain).     ondansetron  (ZOFRAN ) 4 MG tablet Take 1 tablet (4 mg total) by mouth every 8 (eight) hours as needed for nausea or vomiting. 30 tablet 0   predniSONE  (DELTASONE ) 20 MG tablet Take two tablets by mouth once daily for 5 days. 10 tablet 0   No current facility-administered medications on file prior to visit.        ROS:  All others reviewed and negative.  Objective        PE:  BP 110/64 (BP Location: Right Arm, Patient Position: Sitting, Cuff Size: Normal)   Pulse 64   Temp 98 F (36.7 C) (Oral)   Ht 5' 10 (1.778 m)   Wt 222 lb (100.7 kg)   SpO2 99%   BMI 31.85 kg/m  Constitutional: Pt appears in NAD               HENT: Head: NCAT.                Right Ear: External ear normal.                 Left Ear: External ear normal.                Eyes: . Pupils are equal, round, and reactive to light. Conjunctivae and EOM are normal               Nose: without d/c or deformity               Neck: Neck supple. Gross normal ROM               Cardiovascular: Normal rate and regular rhythm.                 Pulmonary/Chest: Effort normal and breath sounds without rales or wheezing.                Abd:  Soft, NT, ND, + BS, no organomegaly               Neurological: Pt is alert. At baseline orientation, motor grossly intact               Skin: Skin is warm. No rashes, no other new lesions, LE edema - none               Psychiatric: Pt behavior is normal without agitation   Micro: none  Cardiac tracings I have personally interpreted today:  none  Pertinent Radiological findings (summarize): none   Lab Results  Component Value Date   WBC 5.7 01/01/2024   HGB 15.1 01/01/2024   HCT 45.4 01/01/2024   PLT 197.0 01/01/2024   GLUCOSE 102 (H) 01/01/2024   CHOL 138 01/01/2024   TRIG 46.0 01/01/2024   HDL 63.60 01/01/2024   LDLDIRECT 142.0 09/29/2012   LDLCALC 65 01/01/2024   ALT 25  01/01/2024   AST 27 01/01/2024   NA 140 01/01/2024   K 5.1 01/01/2024   CL 104 01/01/2024   CREATININE 1.11 01/01/2024   BUN 21 01/01/2024   CO2 29 01/01/2024   TSH 2.62 01/01/2024   PSA 0.76 01/01/2024   INR 1.2 01/10/2021   HGBA1C 5.6 01/01/2024   MICROALBUR <0.7 01/01/2024   Assessment/Plan:  Todd Barrett. is a 73 y.o. White or Caucasian [1] male with  has a past medical history of ALLERGIC RHINITIS (01/08/2010), Allergy, Atrial fibrillation with RVR (HCC) (01/19/2018), CERVICAL RADICULOPATHY, RIGHT (01/08/2010), CHEST PAIN (01/08/2010), DIVERTICULOSIS, COLON (01/08/2010), Dysrhythmia, GERD (gastroesophageal reflux disease), GOUT (01/08/2010), HYPERLIPIDEMIA (01/08/2010), Hypertension, Lumbar disc disease (08/05/2011), Osteoarthritis of right knee, and OSTEOARTHRITIS, KNEES, BILATERAL (01/08/2010).  Allergic rhinitis Mild to mod, for allegara and /or flonase  asd,,  to f/u any worsening symptoms or concerns  Hyperglycemia Lab Results  Component Value Date   HGBA1C 5.6 01/01/2024   Stable, pt to continue current medical treatment  - diet, wt control   Hyperlipemia Lab Results  Component Value Date   LDLCALC 65 01/01/2024   Stable, pt to continue current statin lipitor 10 qd   Hypertension BP Readings from Last 3 Encounters:  01/01/24 110/64  05/02/23 (!) 142/98  04/23/23 (!) 137/94   Stable, pt to continue medical treatment lisinopril  10 every day, coreg  3.125 bid  Followup: Return in about  1 year (around 12/31/2024).  Lynwood Rush, MD 01/03/2024 7:29 PM [facility] - [facility]

## 2024-01-01 NOTE — Patient Instructions (Signed)

## 2024-01-01 NOTE — Progress Notes (Signed)
 The test results show that your current treatment is OK, as the tests are stable.  Please continue the same plan.  There is no other need for change of treatment or further evaluation based on these results, at this time.  thanks

## 2024-01-03 ENCOUNTER — Encounter: Payer: Self-pay | Admitting: Internal Medicine

## 2024-01-03 NOTE — Assessment & Plan Note (Signed)
 BP Readings from Last 3 Encounters:  01/01/24 110/64  05/02/23 (!) 142/98  04/23/23 (!) 137/94   Stable, pt to continue medical treatment lisinopril  10 every day, coreg  3.125 bid

## 2024-01-03 NOTE — Assessment & Plan Note (Signed)
 Lab Results  Component Value Date   HGBA1C 5.6 01/01/2024   Stable, pt to continue current medical treatment  - diet, wt control

## 2024-01-03 NOTE — Assessment & Plan Note (Signed)
 Lab Results  Component Value Date   LDLCALC 65 01/01/2024   Stable, pt to continue current statin lipitor 10 qd

## 2024-01-03 NOTE — Assessment & Plan Note (Signed)
 Mild to mod, for allegara and /or flonase  asd,,  to f/u any worsening symptoms or concerns

## 2024-02-02 DIAGNOSIS — H2513 Age-related nuclear cataract, bilateral: Secondary | ICD-10-CM | POA: Diagnosis not present

## 2024-03-01 ENCOUNTER — Encounter: Payer: Self-pay | Admitting: Cardiovascular Disease

## 2024-03-01 ENCOUNTER — Ambulatory Visit: Payer: Medicare Other | Attending: Cardiovascular Disease | Admitting: Cardiovascular Disease

## 2024-03-01 VITALS — BP 116/80 | HR 94 | Ht 70.0 in | Wt 222.2 lb

## 2024-03-01 DIAGNOSIS — I480 Paroxysmal atrial fibrillation: Secondary | ICD-10-CM

## 2024-03-01 DIAGNOSIS — I10 Essential (primary) hypertension: Secondary | ICD-10-CM

## 2024-03-01 DIAGNOSIS — I2510 Atherosclerotic heart disease of native coronary artery without angina pectoris: Secondary | ICD-10-CM | POA: Diagnosis not present

## 2024-03-01 DIAGNOSIS — E7800 Pure hypercholesterolemia, unspecified: Secondary | ICD-10-CM

## 2024-03-01 NOTE — Patient Instructions (Signed)
 Medication Instructions:  No changes today *If you need a refill on your cardiac medications before your next appointment, please call your pharmacy*  Lab Work: none If you have labs (blood work) drawn today and your tests are completely normal, you will receive your results only by: MyChart Message (if you have MyChart) OR A paper copy in the mail If you have any lab test that is abnormal or we need to change your treatment, we will call you to review the results.  Testing/Procedures: none  Follow-Up: At [facility], you and your health needs are our priority.  As part of our continuing mission to provide you with exceptional heart care, our providers are all part of one team.  This team includes your primary Cardiologist (physician) and Advanced Practice Providers or APPs (Physician Assistants and Nurse Practitioners) who all work together to provide you with the care you need, when you need it.  Your next appointment:   12 month(s)  Provider:   Verne Carrow, MD      Other Instructions Afib clinic in one month as scheduled.    1st Floor: - Lobby - Registration  - Pharmacy  - Lab - Cafe  2nd Floor: - PV Lab - Diagnostic Testing (echo, CT, nuclear med)  3rd Floor: - Vacant  4th Floor: - TCTS (cardiothoracic surgery) - AFib Clinic - Structural Heart Clinic - Vascular Surgery  - Vascular Ultrasound  5th Floor: - HeartCare Cardiology (general and EP) - Clinical Pharmacy for coumadin, hypertension, lipid, weight-loss medications, and med management appointments    Valet parking services will be available as well.

## 2024-03-01 NOTE — Progress Notes (Signed)
 Chief Complaint  Patient presents with   Follow-up    CAD, PAF   History of Present Illness:72 yo male with history of mild CAD, paroxysmal atrial fibrillation, HTN, HLD and sleep apnea who is here today for follow up. He has been followed in our office by Dr. Eldridge Dace. He was diagnosed with atrial fibrillation in 2019 after a knee replacement surgery. He was treated with Cardizem and Xarelto and converted to sinus. His Xarelto was stopped. He was seen in the ED in January 2022 with recurrent atrial fibrillation and was started on Eliquis. (CHADS VASC score 2). Echo February 2022 with LVEF=50-55%, no valve disease. Mild dilation of aortic root. Cardiac CTA February 2022 with aortic root 38 mm, mild plaque in the LAD. Aortic atherosclerosis noted. He has been seen most recently in our office in April 2024 by Jari Favre, PA-C and was doing well. He owns a farm and wedding venue.   He is here today for follow up. The patient denies any chest pain, dyspnea, palpitations, lower extremity edema, orthopnea, PND, dizziness, near syncope or syncope. He has not noticed a big change in his energy level over the past. He stays busy and is mostly limited by back pain.   Primary Care Physician: Corwin Levins, MD   Past Medical History:  Diagnosis Date   ALLERGIC RHINITIS 01/08/2010   Allergy    dust mites, pet dander   Atrial fibrillation with RVR (HCC) 01/19/2018   CERVICAL RADICULOPATHY, RIGHT 01/08/2010   CHEST PAIN 01/08/2010   DIVERTICULOSIS, COLON 01/08/2010   Dysrhythmia    post op   during PT a fib RVR   GERD (gastroesophageal reflux disease)    Zantac OTC  prn   GOUT 01/08/2010   in past/ when has surgery   HYPERLIPIDEMIA 01/08/2010   Hypertension    Lumbar disc disease 08/05/2011   Osteoarthritis of right knee    end stage   OSTEOARTHRITIS, KNEES, 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    Past Surgical History:  Procedure Laterality Date   ANTERIOR CERVICAL DECOMP/DISCECTOMY FUSION  07/23/2012    Procedure: ANTERIOR CERVICAL DECOMPRESSION/DISCECTOMY FUSION 2 LEVELS;  Surgeon: Clydene Fake, MD;  Location: [facility];  Service: Neurosurgery;  Laterality: N/A;  Cervical three-four, four-five, redo Cervical six-seven anterior cervical decompression, discectomy fusion with allograft and plating   BACK SURGERY  03/2004, 02/2005   CERVICAL SPINE SURGERY  05/2010   CHOLECYSTECTOMY N/A 01/11/2021   Procedure: LAPAROSCOPIC CHOLECYSTECTOMY;  Surgeon: Axel Filler, MD;  Location: [facility] OR;  Service: General;  Laterality: N/A;   clavicle facture  nov. 2009   100% displacement   COLONOSCOPY     ERCP N/A 01/10/2021   Procedure: ENDOSCOPIC RETROGRADE CHOLANGIOPANCREATOGRAPHY (ERCP);  Surgeon: Meryl Dare, MD;  Location: [facility] ENDOSCOPY;  Service: Endoscopy;  Laterality: N/A;   HERNIA REPAIR     inguinal hernia/right side/ 73 years old   JOINT REPLACEMENT     Left knee 2006   KNEE ARTHROSCOPY     total of 5   left knee arthroscopy  september 2005   lumbar disc surgury  02/2002   lumbar disc surgury  june 2004   NASAL RECONSTRUCTION     x 3   REMOVAL OF STONES  01/10/2021   Procedure: REMOVAL OF STONES;  Surgeon: Meryl Dare, MD;  Location: [facility] ENDOSCOPY;  Service: Endoscopy;;   REVERSE SHOULDER ARTHROPLASTY Left 07/30/2018   Procedure: LEFT REVERSE SHOULDER ARTHROPLASTY;  Surgeon: Francena Hanly, MD;  Location: [facility] OR;  Service: Orthopedics;  Laterality: Left;    s/p ENT surgury     x 3   s/p left shoulder surgury  feb. 2009   s/p left TKR  april 2006   SPHINCTEROTOMY  01/10/2021   Procedure: SPHINCTEROTOMY;  Surgeon: Meryl Dare, MD;  Location: [facility] ENDOSCOPY;  Service: Endoscopy;;   TOTAL HIP ARTHROPLASTY Right 05/01/2023   Procedure: TOTAL HIP ARTHROPLASTY ANTERIOR APPROACH;  Surgeon: Samson Frederic, MD;  Location: [facility] ORS;  Service: Orthopedics;  Laterality: Right;  140   TOTAL KNEE ARTHROPLASTY Right 01/16/2018   Procedure: RIGHT TOTAL KNEE ARTHROPLASTY;  Surgeon: Beverely Low,  MD;  Location: [facility] OR;  Service: Orthopedics;  Laterality: Right;   TOTAL SHOULDER ARTHROPLASTY Right 09/16/2019   Procedure: Right Reverse shoulder arthroplasty;  Surgeon: Francena Hanly, MD;  Location: [facility] ORS;  Service: Orthopedics;  Laterality: Right;    Current Outpatient Medications  Medication Sig Dispense Refill   apixaban (ELIQUIS) 5 MG TABS tablet Take 1 tablet (5 mg total) by mouth 2 (two) times daily. 180 tablet 3   atorvastatin (LIPITOR) 10 MG tablet Take 1 tablet (10 mg total) by mouth daily. 90 tablet 3   Calcium Polycarbophil (FIBER-CAPS PO) Take 2 tablets by mouth in the morning.     carvedilol (COREG) 3.125 MG tablet Take 1 tablet (3.125 mg total) by mouth 2 (two) times daily. 180 tablet 3   Cholecalciferol (VITAMIN D3) 50 MCG (2000 UT) capsule Take 4,000 Units by mouth daily.     fexofenadine (ALLEGRA) 180 MG tablet Take 180 mg by mouth in the morning and at bedtime.     fluticasone (FLONASE) 50 MCG/ACT nasal spray Place 2 sprays into the nose daily as needed for allergies or rhinitis. For allergies.     lisinopril (ZESTRIL) 10 MG tablet Take 1 tablet (10 mg total) by mouth daily. 90 tablet 3   naproxen sodium (ALEVE) 220 MG tablet Take 440 mg by mouth 2 (two) times daily as needed (Pain).     ondansetron (ZOFRAN) 4 MG tablet Take 1 tablet (4 mg total) by mouth every 8 (eight) hours as needed for nausea or vomiting. (Patient not taking: Reported on 03/01/2024) 30 tablet 0   predniSONE (DELTASONE) 20 MG tablet Take two tablets by mouth once daily for 5 days. (Patient not taking: Reported on 03/01/2024) 10 tablet 0   No current facility-administered medications for this visit.    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 [place]    Employer: [employer]  Tobacco Use   Smoking status: Never   Smokeless tobacco: Never  Vaping Use   Vaping status: Never Used  Substance and Sexual Activity   Alcohol use: Yes    Alcohol/week: 12.0 standard drinks of alcohol    Types: 12 Cans of beer per week    Comment: occ beer   Drug use: Never   Sexual activity: Yes  Other Topics Concern   Not on file  Social  History Narrative   Not on file   Social Drivers of Health   Financial Resource Strain: Low Risk  (07/03/2023)   Overall Financial Resource Strain (CARDIA)    Difficulty of Paying Living Expenses: Not hard at all  Food Insecurity: No Food Insecurity (07/03/2023)   Hunger Vital Sign    Worried About Running Out of Food in the Last Year: Never true    Ran Out of Food in the Last Year: Never true  Transportation Needs: No Transportation Needs (07/03/2023)   PRAPARE - Administrator, Civil Service (Medical): No    Lack of Transportation (Non-Medical): No  Physical Activity: Sufficiently Active (07/03/2023)   Exercise Vital Sign    Days of Exercise per Week: 7 days    Minutes of Exercise per Session: 60 min  Stress: No Stress Concern Present (07/03/2023)   [employer] of Occupational Health - Occupational Stress Questionnaire    Feeling of Stress : Not at all  Social Connections: Moderately Isolated (07/03/2023)   Social Connection and Isolation Panel [NHANES]    Frequency of Communication with Friends and Family: More than three times a week    Frequency of Social Gatherings with Friends and Family: More than three times a week    Attends Religious Services: Never    Database administrator or Organizations: No    Attends Banker Meetings: Never    Marital Status: Married  Catering manager Violence: Not At Risk  (07/03/2023)   Humiliation, Afraid, Rape, and Kick questionnaire    Fear of Current or Ex-Partner: No    Emotionally Abused: No    Physically Abused: No    Sexually Abused: No    Family History  Problem Relation Age of Onset   Bradycardia Father 38       Pacemaker   Lymphoma Mother    Alzheimer's disease Mother    Pancreatic cancer Brother    Colon cancer Neg Hx     Review of Systems:  As stated in the HPI and otherwise negative.   BP 116/80   Pulse 94   Ht 5\' 10"  (1.778 m)   Wt 100.8 kg   SpO2 98%   BMI 31.88 kg/m   Physical Examination: General: Well developed, well nourished, NAD  HEENT: OP clear, mucus membranes moist  SKIN: warm, dry. No rashes. Neuro: No focal deficits  Musculoskeletal: Muscle strength 5/5 all ext  Psychiatric: Mood and affect normal  Neck: No JVD, no carotid bruits, no thyromegaly, no lymphadenopathy.  Lungs:Clear bilaterally, no wheezes, rhonci, crackles Cardiovascular: Irreg irreg. No murmurs, gallops or rubs. Abdomen:Soft. Bowel sounds present. Non-tender.  Extremities: No lower extremity edema. Pulses are 2 + in the bilateral DP/PT.  EKG:  EKG is ordered today. The ekg ordered today demonstrates  EKG Interpretation Date/Time:  Monday March 01 2024 10:28:43 EDT Ventricular Rate:  81 PR Interval:    QRS Duration:  82 QT Interval:  358 QTC Calculation: 415 R Axis:   25  Text Interpretation: Atrial fibrillation Poor R wave progression Confirmed by Verne Carrow [id number]) on 03/01/2024 10:41:37 AM   Recent Labs: 01/01/2024: ALT 25; BUN 21; Creatinine, Ser 1.11; Hemoglobin 15.1; Platelets 197.0; Potassium 5.1; Sodium 140; TSH 2.62   Lipid Panel    Component Value Date/Time   CHOL 138 01/01/2024 1008   TRIG 46.0 01/01/2024 1008   TRIG 93 10/23/2006 0848   HDL 63.60 01/01/2024 1008   CHOLHDL 2 01/01/2024 1008   VLDL 9.2 01/01/2024  1008   LDLCALC 65 01/01/2024 1008   LDLDIRECT 142.0 09/29/2012 0938     Wt Readings from Last 3  Encounters:  03/01/24 100.8 kg  01/01/24 100.7 kg  07/03/23 93 kg    Assessment and Plan:   1. CAD without angina: Mild CAD by cardiac CTA in February 2022. (Less than 25% plaque in the proximal LAD). No chest pain. Continue beta blocker and statin. No ASA since he is on Eliquis.   2. Paroxysmal atrial fibrillation: Atrial fib today with good rate control. He has converted to sinus quickly in the past. I will arrange f/u in the atrial fib clinic in one month. If he remains in atrial fib at that visit, consider cardioversion. Continue beta blocker and Eliquis (CHADS VASC score 2).   3. HLD: LDL 65 in February 2025. Continue statin.   4. Aortic atherosclerosis: Continue statin  Labs/ tests ordered today include:   Orders Placed This Encounter  Procedures   EKG 12-Lead   Disposition:   F/U with atrial fib clinic in one month. Follow up with me in one year.   Signed, Verne Carrow, MD, [facility] 03/01/2024 10:56 AM    [facility] [address] [place], [place], [place]  [address] Phone: [phone]; Fax: [phone]

## 2024-03-31 ENCOUNTER — Encounter (HOSPITAL_COMMUNITY): Payer: Self-pay | Admitting: Physician Assistant

## 2024-03-31 ENCOUNTER — Ambulatory Visit (HOSPITAL_COMMUNITY)
Admission: RE | Admit: 2024-03-31 | Discharge: 2024-03-31 | Disposition: A | Discharge status: Home / Self Care | Source: Ambulatory Visit | Attending: Physician Assistant | Admitting: Physician Assistant

## 2024-03-31 VITALS — BP 138/90 | HR 77 | Ht 70.0 in | Wt 219.8 lb

## 2024-03-31 DIAGNOSIS — I4819 Other persistent atrial fibrillation: Secondary | ICD-10-CM | POA: Diagnosis not present

## 2024-03-31 DIAGNOSIS — I480 Paroxysmal atrial fibrillation: Secondary | ICD-10-CM | POA: Insufficient documentation

## 2024-03-31 DIAGNOSIS — D6869 Other thrombophilia: Secondary | ICD-10-CM | POA: Insufficient documentation

## 2024-03-31 LAB — BASIC METABOLIC PANEL WITH GFR
BUN/Creatinine Ratio: 19 (ref 10–24)
BUN: 20 mg/dL (ref 8–27)
CO2: 20 mmol/L (ref 20–29)
Calcium: 9.4 mg/dL (ref 8.6–10.2)
Chloride: 104 mmol/L (ref 96–106)
Creatinine, Ser: 1.06 mg/dL (ref 0.76–1.27)
Glucose: 97 mg/dL (ref 70–99)
Potassium: 4.7 mmol/L (ref 3.5–5.2)
Sodium: 139 mmol/L (ref 134–144)
eGFR: 74 mL/min/{1.73_m2} (ref >59)

## 2024-03-31 LAB — CBC
Hematocrit: 41.3 % (ref 37.5–51.0)
Hemoglobin: 14.5 g/dL (ref 13.0–17.7)
MCH: 31.2 pg (ref 26.6–33.0)
MCHC: 35.1 g/dL (ref 31.5–35.7)
MCV: 89 fL (ref 79–97)
Platelets: 193 10*3/uL (ref 150–450)
RBC: 4.65 x10E6/uL (ref 4.14–5.80)
RDW: 12 % (ref 11.6–15.4)
WBC: 5.8 10*3/uL (ref 3.4–10.8)

## 2024-03-31 NOTE — Patient Instructions (Signed)
 Cardioversion scheduled for: Monday, June 2nd   - Arrive at the [facility]" of [facility] ([address])  and check in with ADMITTING at 6:30AM   - Do not eat or drink anything after midnight the night prior to your procedure.   - Take all your morning medication (except diabetic medications) with a sip of water  prior to arrival.  - Do NOT miss any doses of your blood thinner - if you should miss a dose or take a dose more than 4 hours late -- please notify our office immediately.  - You will not be able to drive home after your procedure. Please ensure you have a responsible adult to drive you home. You will need someone with you for 24 hours post procedure.     - Expect to be in the procedural area approximately 2 hours.   - If you feel as if you go back into normal rhythm prior to scheduled cardioversion, please notify our office immediately.   If your procedure is canceled in the cardioversion suite you will be charged a cancellation fee.

## 2024-03-31 NOTE — H&P (View-Only) (Signed)
 Primary Care Physician: Roslyn Coombe, MD Primary Cardiologist: Dr Abel Hoe Primary Electrophysiologist: none Referring Physician: Arlin Benes ED   [name]. is a 73 y.o. male with a history of HLD, HTN, OSA, atrial fibrillation who presents for follow up in the [facility]. The patient was initially diagnosed with atrial fibrillation in 2019 after a total knee replacement when he was noted to have a fast, irregular heart rate while working with physical therapy. He was treated with Cardizem  and then placed on Xarelto .  Cardioversion was planned but he went back into normal sinus rhythm prior to hospital discharge. He was later taken off of both Xarelto  and Cardizem  when he had no recurrence of AF it was presumed to be only in the postop period.  He later had an outpatient Holter monitor which did not reveal recurrence of atrial fibrillation.   Patient was seen at the ED 12/21/20 with abdominal pain and was found to be in rate controlled afib. He was started on Eliquis  for stroke prevention. He returned to the ED 01/06/21 with chest pain and was admitted. Workup negative for ACS but he was diagnosed with acute gallstone pancreatitis and underwent cholecystectomy. He converted to SR spontaneously during admission. He was unaware of his afib while in the hospital.   Patient returns for follow up for atrial fibrillation. Patient was seen by Dr Abel Hoe on 03/01/24 and found to be in rate controlled afib. He remains in afib today and is asymptomatic. He does report missing a dose of Eliquis  this AM.   Today, he  denies symptoms of palpitations, chest pain, shortness of breath, orthopnea, PND, lower extremity edema, dizziness, presyncope, syncope, snoring, daytime somnolence, bleeding, or neurologic sequela. The patient is tolerating medications without difficulties and is otherwise without complaint today.    Atrial Fibrillation Risk Factors:  he does not have  symptoms or diagnosis of sleep apnea. Negative sleep study 04/02/21 he does not have a history of rheumatic fever. he does have a history of alcohol  use. The patient does not have a history of early familial atrial fibrillation or other arrhythmias.   Atrial Fibrillation Management history:  Previous antiarrhythmic drugs: none Previous cardioversions: none Previous ablations: none Anticoagulation history: Xarelto , Eliquis    Past Medical History:  Diagnosis Date   ALLERGIC RHINITIS 01/08/2010   Allergy    dust mites, pet dander   Atrial fibrillation with RVR (HCC) 01/19/2018   CERVICAL RADICULOPATHY, RIGHT 01/08/2010   CHEST PAIN 01/08/2010   DIVERTICULOSIS, COLON 01/08/2010   Dysrhythmia    post op   during PT a fib RVR   GERD (gastroesophageal reflux disease)    Zantac OTC  prn   GOUT 01/08/2010   in past/ when has surgery   HYPERLIPIDEMIA 01/08/2010   Hypertension    Lumbar disc disease 08/05/2011   Osteoarthritis of right knee    end stage   OSTEOARTHRITIS, KNEES, 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 180 tablet 3   atorvastatin  (LIPITOR) 10 MG tablet Take 1 tablet (10 mg total) by mouth daily. 90 tablet 3   Calcium  Polycarbophil (FIBER-CAPS PO) Take 2 tablets by mouth in the morning.     carvedilol  (COREG ) 3.125 MG tablet Take 1 tablet (3.125 mg total) by mouth 2 (two) times daily. 180 tablet 3   Cholecalciferol (VITAMIN  D3) 50 MCG (2000 UT) capsule Take 4,000 Units by mouth daily.     fexofenadine (ALLEGRA) 180 MG tablet Take 180 mg by mouth in the morning and at bedtime.     fluticasone  (FLONASE ) 50 MCG/ACT nasal spray Place 2 sprays into the nose daily as needed for allergies or rhinitis. For allergies.     lisinopril  (ZESTRIL ) 10 MG tablet Take 1 tablet (10 mg total) by mouth daily. 90 tablet 3   naproxen  sodium (ALEVE )  220 MG tablet Take 440 mg by mouth 2 (two) times daily as needed (Pain).     ondansetron  (ZOFRAN ) 4 MG tablet Take 1 tablet (4 mg total) by mouth every 8 (eight) hours as needed for nausea or vomiting. 30 tablet 0   predniSONE  (DELTASONE ) 20 MG tablet Take two tablets by mouth once daily for 5 days. 10 tablet 0   No current facility-administered medications for this encounter.    ROS- All systems are reviewed and negative except as per the HPI above.  Physical Exam: Vitals:   03/31/24 0948  BP: (!) 138/90  Pulse: 77  Weight: 99.7 kg  Height: 5\' 10"  (1.778 m)    GEN: Well nourished, well developed in no acute distress CARDIAC: Irregularly irregular rate and rhythm, no murmurs, rubs, gallops RESPIRATORY:  Clear to auscultation without rales, wheezing or rhonchi  ABDOMEN: Soft, non-tender, non-distended EXTREMITIES:  No edema; No deformity    Wt Readings from Last 3 Encounters:  03/31/24 99.7 kg  03/01/24 100.8 kg  01/01/24 100.7 kg    EKG today demonstrates  Afib vs atypical atrial flutter with variable block Vent. rate 77 BPM PR interval * ms QRS duration 94 ms QT/QTcB 372/420 ms   Echo 01/07/21 demonstrated  1. Left ventricular ejection fraction, by estimation, is 50 to 55%. The  left ventricle has low normal function. The left ventricle has no regional  wall motion abnormalities. Left ventricular diastolic parameters were  normal.   2. Right ventricular systolic function is normal. The right ventricular  size is normal.   3. The mitral valve is grossly normal. No evidence of mitral valve  regurgitation.   4. The aortic valve is tricuspid. There is mild calcification of the  aortic valve. There is mild thickening of the aortic valve. Aortic valve  regurgitation is not visualized. No aortic stenosis is present.   5. Aortic dilatation noted. 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 There is borderline dilatation of the  ascending  aorta, measuring 37 mm. There is mild dilatation of the  transverse aorta, measuring 37 mm.   Comparison(s): A prior study was performed on 01/29/2018. Prior images  reviewed side by side. Slight decrease in LV function, aortic dilation  noted.   Epic records are reviewed at length today  CHA2DS2-VASc Score = 3  The patient's score is based upon: CHF History: 0 HTN History: 1 Diabetes History: 0 Stroke History: 0 Vascular Disease History: 1 (aortic atherosclerosis) Age Score: 1 Gender Score: 0       ASSESSMENT AND PLAN: Persistent Atrial Fibrillation (ICD10:  I48.19) The patient's CHA2DS2-VASc score is 3, indicating a 3.2% annual risk of stroke.   Patient remains in persistent afib. We discussed rhythm control options today. Will plan for DCCV after at least 3 weeks of uninterrupted anticoagulation. He did miss his dose of Eliquis  this AM.  Check bmet/cbc Continue Eliquis  5 mg BID Continue carvedilol  3.125 mg BID  Secondary Hypercoagulable State (ICD10:  I69.62) The patient is at significant risk for stroke/thromboembolism based upon his CHA2DS2-VASc Score of 3.  Continue Apixaban  (Eliquis ). No bleeding issues.   HTN Stable on current regimen   Follow up in the AF clinic post DCCV.    Informed Consent   Shared Decision Making/Informed Consent The risks (stroke, cardiac arrhythmias rarely resulting in the need for a temporary or permanent pacemaker, skin irritation or burns and complications associated with conscious sedation including aspiration, arrhythmia, respiratory failure and death), benefits (restoration of normal sinus rhythm) and alternatives of a direct current cardioversion were explained in detail to Mr. Stromain and he agrees to proceed.       Myrtha Ates PA-C Afib Clinic [facility] [address] [place], [place] [address] [phone] 03/31/2024 9:53 AM

## 2024-03-31 NOTE — Progress Notes (Addendum)
 Primary Care Physician: Roslyn Coombe, MD Primary Cardiologist: Dr Abel Hoe Primary Electrophysiologist: none Referring Physician: Arlin [facility]   Todd Lyons. is a 73 y.o. male with a history of HLD, HTN, OSA, atrial fibrillation who presents for follow up in the [facility]. The patient was initially diagnosed with atrial fibrillation in 2019 after a total knee replacement when he was noted to have a fast, irregular heart rate while working with physical therapy. He was treated with Cardizem  and then placed on Xarelto .  Cardioversion was planned but he went back into normal sinus rhythm prior to hospital discharge. He was later taken off of both Xarelto  and Cardizem  when he had no recurrence of AF it was presumed to be only in the postop period.  He later had an outpatient Holter monitor which did not reveal recurrence of atrial fibrillation.   Patient was seen at the ED 12/21/20 with abdominal pain and was found to be in rate controlled afib. He was started on Eliquis  for stroke prevention. He returned to the ED 01/06/21 with chest pain and was admitted. Workup negative for ACS but he was diagnosed with acute gallstone pancreatitis and underwent cholecystectomy. He converted to SR spontaneously during admission. He was unaware of his afib while in the hospital.   Patient returns for follow up for atrial fibrillation. Patient was seen by Dr Abel Hoe on 03/01/24 and found to be in rate controlled afib. He remains in afib today and is asymptomatic. He does report missing a dose of Eliquis  this AM.   Today, he  denies symptoms of palpitations, chest pain, shortness of breath, orthopnea, PND, lower extremity edema, dizziness, presyncope, syncope, snoring, daytime somnolence, bleeding, or neurologic sequela. The patient is tolerating medications without difficulties and is otherwise without complaint today.    Atrial Fibrillation Risk Factors:  he does not have  symptoms or diagnosis of sleep apnea. Negative sleep study 04/02/21 he does not have a history of rheumatic fever. he does have a history of alcohol  use. The patient does not have a history of early familial atrial fibrillation or other arrhythmias.   Atrial Fibrillation Management history:  Previous antiarrhythmic drugs: none Previous cardioversions: none Previous ablations: none Anticoagulation history: Xarelto , Eliquis    Past Medical History:  Diagnosis Date   ALLERGIC RHINITIS 01/08/2010   Allergy    dust mites, pet dander   Atrial fibrillation with RVR (HCC) 01/19/2018   CERVICAL RADICULOPATHY, RIGHT 01/08/2010   CHEST PAIN 01/08/2010   DIVERTICULOSIS, COLON 01/08/2010   Dysrhythmia    post op   during PT a fib RVR   GERD (gastroesophageal reflux disease)    Zantac OTC  prn   GOUT 01/08/2010   in past/ when has surgery   HYPERLIPIDEMIA 01/08/2010   Hypertension    Lumbar disc disease 08/05/2011   Osteoarthritis of right knee    end stage   OSTEOARTHRITIS, KNEES, 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 180 tablet 3   atorvastatin  (LIPITOR) 10 MG tablet Take 1 tablet (10 mg total) by mouth daily. 90 tablet 3   Calcium  Polycarbophil (FIBER-CAPS PO) Take 2 tablets by mouth in the morning.     carvedilol  (COREG ) 3.125 MG tablet Take 1 tablet (3.125 mg total) by mouth 2 (two) times daily. 180 tablet 3   Cholecalciferol (VITAMIN  D3) 50 MCG (2000 UT) capsule Take 4,000 Units by mouth daily.     fexofenadine (ALLEGRA) 180 MG tablet Take 180 mg by mouth in the morning and at bedtime.     fluticasone  (FLONASE ) 50 MCG/ACT nasal spray Place 2 sprays into the nose daily as needed for allergies or rhinitis. For allergies.     lisinopril  (ZESTRIL ) 10 MG tablet Take 1 tablet (10 mg total) by mouth daily. 90 tablet 3   naproxen  sodium (ALEVE )  220 MG tablet Take 440 mg by mouth 2 (two) times daily as needed (Pain).     ondansetron  (ZOFRAN ) 4 MG tablet Take 1 tablet (4 mg total) by mouth every 8 (eight) hours as needed for nausea or vomiting. 30 tablet 0   predniSONE  (DELTASONE ) 20 MG tablet Take two tablets by mouth once daily for 5 days. 10 tablet 0   No current facility-administered medications for this encounter.    ROS- All systems are reviewed and negative except as per the HPI above.  Physical Exam: Vitals:   03/31/24 0948  BP: (!) 138/90  Pulse: 77  Weight: 99.7 kg  Height: 5\' 10"  (1.778 m)    GEN: Well nourished, well developed in no acute distress CARDIAC: Irregularly irregular rate and rhythm, no murmurs, rubs, gallops RESPIRATORY:  Clear to auscultation without rales, wheezing or rhonchi  ABDOMEN: Soft, non-tender, non-distended EXTREMITIES:  No edema; No deformity    Wt Readings from Last 3 Encounters:  03/31/24 99.7 kg  03/01/24 100.8 kg  01/01/24 100.7 kg    EKG today demonstrates  Afib vs atypical atrial flutter with variable block Vent. rate 77 BPM PR interval * ms QRS duration 94 ms QT/QTcB 372/420 ms   Echo 01/07/21 demonstrated  1. Left ventricular ejection fraction, by estimation, is 50 to 55%. The  left ventricle has low normal function. The left ventricle has no regional  wall motion abnormalities. Left ventricular diastolic parameters were  normal.   2. Right ventricular systolic function is normal. The right ventricular  size is normal.   3. The mitral valve is grossly normal. No evidence of mitral valve  regurgitation.   4. The aortic valve is tricuspid. There is mild calcification of the  aortic valve. There is mild thickening of the aortic valve. Aortic valve  regurgitation is not visualized. No aortic stenosis is present.   5. Aortic dilatation noted. 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 There is borderline dilatation of the  ascending  aorta, measuring 37 mm. There is mild dilatation of the  transverse aorta, measuring 37 mm.   Comparison(s): A prior study was performed on 01/29/2018. Prior images  reviewed side by side. Slight decrease in LV function, aortic dilation  noted.   Epic records are reviewed at length today  CHA2DS2-VASc Score = 3  The patient's score is based upon: CHF History: 0 HTN History: 1 Diabetes History: 0 Stroke History: 0 Vascular Disease History: 1 (aortic atherosclerosis) Age Score: 1 Gender Score: 0       ASSESSMENT AND PLAN: Persistent Atrial Fibrillation (ICD10:  I48.19) The patient's CHA2DS2-VASc score is 3, indicating a 3.2% annual risk of stroke.   Patient remains in persistent afib. We discussed rhythm control options today. Will plan for DCCV after at least 3 weeks of uninterrupted anticoagulation. He did miss his dose of Eliquis  this AM.  Check bmet/cbc Continue Eliquis  5 mg BID Continue carvedilol  3.125 mg BID  Secondary Hypercoagulable State (ICD10:  I69.62) The patient is at significant risk for stroke/thromboembolism based upon his CHA2DS2-VASc Score of 3.  Continue Apixaban  (Eliquis ). No bleeding issues.   HTN Stable on current regimen   Follow up in the AF clinic post DCCV.    Informed Consent   Shared Decision Making/Informed Consent The risks (stroke, cardiac arrhythmias rarely resulting in the need for a temporary or permanent pacemaker, skin irritation or burns and complications associated with conscious sedation including aspiration, arrhythmia, respiratory failure and death), benefits (restoration of normal sinus rhythm) and alternatives of a direct current cardioversion were explained in detail to Todd Barrett and he agrees to proceed.       Myrtha Ates PA-C Afib Clinic [facility] [address] [place], [place] [address] [phone] 03/31/2024 9:53 AM

## 2024-04-23 NOTE — Progress Notes (Signed)
 Attempted preop phone call for CV.  No identified answering machine of number or name.  Unable to leave info.

## 2024-04-23 NOTE — Progress Notes (Signed)
 Pt called for pre procedure instructions.  Left message on identified answering machine of wife. Arrival time 0630 NPO after midnight explained Instructed to take am meds with sip of water  and confirmed blood thinner consistency Instructed pt need for ride home tomorrow and have responsible adult with them for 24 hrs post procedure.

## 2024-04-25 NOTE — Anesthesia Preprocedure Evaluation (Signed)
 Anesthesia Evaluation  Patient identified by MRN, date of birth, ID band Patient awake    Reviewed: Allergy & Precautions, NPO status , Patient's Chart, lab work & pertinent test results  History of Anesthesia Complications Negative for: history of anesthetic complications  Airway Mallampati: II  TM Distance: >3 FB Neck ROM: Full    Dental  (+) Teeth Intact, Dental Advisory Given   Pulmonary neg pulmonary ROS   breath sounds clear to auscultation       Cardiovascular hypertension, Pt. on medications + CAD  (-) Past MI and (-) CHF + dysrhythmias Atrial Fibrillation  Rhythm:Irregular Rate:Abnormal  TTE: IMPRESSIONS     1. Left ventricular ejection fraction, by estimation, is 50 to 55%. The  left ventricle has low normal function. The left ventricle has no regional  wall motion abnormalities. Left ventricular diastolic parameters were  normal.   2. Right ventricular systolic function is normal. The right ventricular  size is normal.   3. The mitral valve is grossly normal. No evidence of mitral valve  regurgitation.   4. The aortic valve is tricuspid. There is mild calcification of the  aortic valve. There is mild thickening of the aortic valve. Aortic valve  regurgitation is not visualized. No aortic stenosis is present.   5. Aortic dilatation noted. 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 There is borderline dilatation of the  ascending aorta, measuring 37 mm. There is mild dilatation of the  transverse aorta, measuring 37 mm.      Neuro/Psych  PSYCHIATRIC DISORDERS Anxiety      Neuromuscular disease    GI/Hepatic Neg liver ROS,GERD  ,,  Endo/Other  negative endocrine ROS    Renal/GU negative Renal ROSLab Results      Component                Value               Date                      CREATININE               1.05                04/23/2023                 Musculoskeletal  (+) Arthritis ,    Abdominal   Peds  Hematology Lab Results      Component                Value               Date                      WBC                      6.5                 04/23/2023                HGB                      15.0                04/23/2023                HCT  43.4                04/23/2023                MCV                      91.4                04/23/2023                PLT                      184                 04/23/2023             Eliquis  last dose Sunday 6/2   Anesthesia Other Findings   Reproductive/Obstetrics                              Anesthesia Physical Anesthesia Plan  ASA: 3  Anesthesia Plan: General   Post-op Pain Management: Minimal or no pain anticipated   Induction: Intravenous  PONV Risk Score and Plan: 1 and Treatment may vary due to age or medical condition  Airway Management Planned: Natural Airway  Additional Equipment: None  Intra-op Plan:   Post-operative Plan:   Informed Consent: I have reviewed the patients History and Physical, chart, labs and discussed the procedure including the risks, benefits and alternatives for the proposed anesthesia with the patient or authorized representative who has indicated his/her understanding and acceptance.     Dental advisory given  Plan Discussed with: CRNA  Anesthesia Plan Comments:          Anesthesia Quick Evaluation

## 2024-04-26 ENCOUNTER — Ambulatory Visit (HOSPITAL_COMMUNITY)
Admission: RE | Admit: 2024-04-26 | Discharge: 2024-04-26 | Disposition: A | Discharge status: Home / Self Care | Attending: Cardiology | Admitting: Cardiology

## 2024-04-26 ENCOUNTER — Encounter (HOSPITAL_COMMUNITY): Payer: Self-pay | Admitting: Cardiology

## 2024-04-26 ENCOUNTER — Ambulatory Visit (HOSPITAL_BASED_OUTPATIENT_CLINIC_OR_DEPARTMENT_OTHER): Payer: Self-pay

## 2024-04-26 ENCOUNTER — Other Ambulatory Visit: Payer: Self-pay

## 2024-04-26 ENCOUNTER — Encounter (HOSPITAL_COMMUNITY)
Admission: RE | Disposition: A | Discharge status: Home / Self Care | Payer: Self-pay | Source: Home / Self Care | Attending: Cardiology

## 2024-04-26 ENCOUNTER — Ambulatory Visit (HOSPITAL_COMMUNITY): Payer: Self-pay

## 2024-04-26 DIAGNOSIS — I2510 Atherosclerotic heart disease of native coronary artery without angina pectoris: Secondary | ICD-10-CM | POA: Insufficient documentation

## 2024-04-26 DIAGNOSIS — I10 Essential (primary) hypertension: Secondary | ICD-10-CM | POA: Diagnosis not present

## 2024-04-26 DIAGNOSIS — I4819 Other persistent atrial fibrillation: Secondary | ICD-10-CM | POA: Insufficient documentation

## 2024-04-26 DIAGNOSIS — Z79899 Other long term (current) drug therapy: Secondary | ICD-10-CM | POA: Diagnosis not present

## 2024-04-26 DIAGNOSIS — D6869 Other thrombophilia: Secondary | ICD-10-CM | POA: Diagnosis not present

## 2024-04-26 DIAGNOSIS — F419 Anxiety disorder, unspecified: Secondary | ICD-10-CM

## 2024-04-26 DIAGNOSIS — Z7901 Long term (current) use of anticoagulants: Secondary | ICD-10-CM | POA: Diagnosis not present

## 2024-04-26 HISTORY — PX: CARDIOVERSION: EP1203

## 2024-04-26 SURGERY — CARDIOVERSION (CATH LAB)
Anesthesia: General

## 2024-04-26 MED ORDER — LIDOCAINE 2% (20 MG/ML) 5 ML SYRINGE
INTRAMUSCULAR | Status: DC | PRN
  Administered 2024-04-26: 80 mg via INTRAVENOUS

## 2024-04-26 MED ORDER — SODIUM CHLORIDE 0.9% FLUSH: 3.0000 mL | INTRAVENOUS | Status: DC | PRN

## 2024-04-26 MED ORDER — SODIUM CHLORIDE 0.9% FLUSH: 3.0000 mL | Freq: Two times a day (BID) | INTRAVENOUS | Status: DC

## 2024-04-26 MED ORDER — PROPOFOL 10 MG/ML IV BOLUS
INTRAVENOUS | Status: DC | PRN
  Administered 2024-04-26: 60 mg via INTRAVENOUS

## 2024-04-26 SURGICAL SUPPLY — 1 items: PAD DEFIB RADIO PHYSIO CONN (PAD) ×1 IMPLANT

## 2024-04-26 NOTE — CV Procedure (Signed)
    Electrical Cardioversion Procedure Note [name] [id number] [birth year]  Procedure: Electrical Cardioversion Indications:  Atrial Fibrillation  Time Out: Verified patient identification, verified procedure,medications/allergies/relevent history reviewed, required imaging and test results available.  Performed  Procedure Details  During this procedure the patient is administered a total of Propofol  60 mg and Lidocaine  80 mg to achieve and maintain moderate conscious sedation.  The patient's heart rate, blood pressure, and oxygen saturation are monitored continuously during the procedure. The period of conscious sedation is 2 minutes, of which I was present face-to-face 100% of this time. [name], CRNA is an independent, trained observer who assisted in the monitoring of the patient's level of consciousness.     Cardioversion was done with synchronized biphasic defibrillation with AP pads with 200watts.  The patient converted to normal sinus rhythm. The patient tolerated the procedure well   IMPRESSION:  Successful cardioversion of atrial fibrillation    Todd Barrett 04/26/2024, 7:41 AM

## 2024-04-26 NOTE — Interval H&P Note (Signed)
 History and Physical Interval Note:  04/26/2024 7:40 AM  Todd Barrett.  has presented today for surgery, with the diagnosis of afib.  The various methods of treatment have been discussed with the patient and family. After consideration of risks, benefits and other options for treatment, the patient has consented to  Procedure(s): CARDIOVERSION (N/A) as a surgical intervention.  The patient's history has been reviewed, patient examined, no change in status, stable for surgery.  I have reviewed the patient's chart and labs.  Questions were answered to the patient's satisfaction.     Gaylyn Keas

## 2024-04-26 NOTE — Discharge Instructions (Signed)

## 2024-04-26 NOTE — Anesthesia Postprocedure Evaluation (Signed)
 Anesthesia Post Note  Patient: Todd Barrett.  Procedure(s) Performed: CARDIOVERSION     Patient location during evaluation: Cath Lab Anesthesia Type: General Level of consciousness: awake and alert Pain management: pain level controlled Vital Signs Assessment: post-procedure vital signs reviewed and stable Respiratory status: spontaneous breathing, nonlabored ventilation, respiratory function stable and patient connected to nasal cannula oxygen Cardiovascular status: blood pressure returned to baseline and stable Postop Assessment: no apparent nausea or vomiting Anesthetic complications: no   No notable events documented.  Last Vitals:  Vitals:   04/26/24 0811 04/26/24 0821  BP: 101/78 114/78  Pulse: 69 66  Resp: 20 17  Temp:    SpO2: 95% 95%    Last Pain:  Vitals:   04/26/24 0821  TempSrc:   PainSc: 0-No pain                 Lethaniel Rave

## 2024-04-26 NOTE — Transfer of Care (Signed)
 Immediate Anesthesia Transfer of Care Note  Patient: Todd Barrett.  Procedure(s) Performed: CARDIOVERSION  Patient Location: Cath Lab  Anesthesia Type:General  Level of Consciousness: awake, alert , and oriented  Airway & Oxygen Therapy: Patient Spontanous Breathing and Patient connected to nasal cannula oxygen  Post-op Assessment: Report given to RN and Post -op Vital signs reviewed and stable  Post vital signs: Reviewed and stable  Last Vitals:  Vitals Value Taken Time  BP 98/72 04/26/24 0801  Temp 36.5 C 04/26/24 0801  Pulse 71 04/26/24 0801  Resp 24 04/26/24 0801  SpO2 98 % 04/26/24 0801  Vitals shown include unfiled device data.  Last Pain:  Vitals:   04/26/24 0801  TempSrc: Tympanic  PainSc: Asleep         Complications: No notable events documented.

## 2024-05-07 ENCOUNTER — Ambulatory Visit (HOSPITAL_COMMUNITY): Admitting: Physician Assistant

## 2024-05-07 ENCOUNTER — Ambulatory Visit (HOSPITAL_COMMUNITY)
Admission: RE | Admit: 2024-05-07 | Discharge: 2024-05-07 | Disposition: A | Discharge status: Home / Self Care | Source: Ambulatory Visit | Attending: Physician Assistant | Admitting: Physician Assistant

## 2024-05-07 ENCOUNTER — Encounter (HOSPITAL_COMMUNITY): Payer: Self-pay | Admitting: Physician Assistant

## 2024-05-07 VITALS — BP 122/88 | HR 80 | Ht 70.0 in | Wt 215.2 lb

## 2024-05-07 DIAGNOSIS — I484 Atypical atrial flutter: Secondary | ICD-10-CM | POA: Diagnosis not present

## 2024-05-07 DIAGNOSIS — D6869 Other thrombophilia: Secondary | ICD-10-CM | POA: Diagnosis not present

## 2024-05-07 DIAGNOSIS — I480 Paroxysmal atrial fibrillation: Secondary | ICD-10-CM | POA: Insufficient documentation

## 2024-05-07 MED ORDER — FLECAINIDE ACETATE 50 MG PO TABS: 50.0000 mg | ORAL_TABLET | Freq: Two times a day (BID) | ORAL | 6 refills | Status: DC

## 2024-05-07 NOTE — Progress Notes (Addendum)
 Primary Care Physician: Roslyn Coombe, MD Primary Cardiologist: Dr Abel Hoe Primary Electrophysiologist: none Referring Physician: Arlin Benes ED   Todd Barrett. is a 73 y.o. male with a history of HLD, HTN, OSA, atrial fibrillation who presents for follow up in the [facility]. The patient was initially diagnosed with atrial fibrillation in 2019 after a total knee replacement when he was noted to have a fast, irregular heart rate while working with physical therapy. He was treated with Cardizem  and then placed on Xarelto .  Cardioversion was planned but he went back into normal sinus rhythm prior to hospital discharge. He was later taken off of both Xarelto  and Cardizem  when he had no recurrence of AF it was presumed to be only in the postop period.  He later had an outpatient Holter monitor which did not reveal recurrence of atrial fibrillation.   Patient was seen at the ED 12/21/20 with abdominal pain and was found to be in rate controlled afib. He was started on Eliquis  for stroke prevention. He returned to the ED 01/06/21 with chest pain and was admitted. Workup negative for ACS but he was diagnosed with acute gallstone pancreatitis and underwent cholecystectomy. He converted to SR spontaneously during admission. He was unaware of his afib while in the hospital.   Patient was seen by Dr Abel Hoe on 03/01/24 and found to be in rate controlled afib. He is s/p DCCV 04/26/24.  Patient returns for follow up for atrial fibrillation. Unfortunately, he has had quick return of his afib. He initially reported that he was asymptomatic with his afib but after the DCCV he did noticed increased energy and less SOB. He states that yesterday he noticed more dyspnea with exertion and lower back pain. There were no specific triggers that he could identify. No bleeding issues on anticoagulation.   Today, he  denies symptoms of palpitations, chest pain, orthopnea, PND, lower  extremity edema, dizziness, presyncope, syncope, snoring, daytime somnolence, bleeding, or neurologic sequela. The patient is tolerating medications without difficulties and is otherwise without complaint today.    Atrial Fibrillation Risk Factors:  he does not have symptoms or diagnosis of sleep apnea. Negative sleep study 04/02/21 he does not have a history of rheumatic fever. he does have a history of alcohol  use. The patient does not have a history of early familial atrial fibrillation or other arrhythmias.   Atrial Fibrillation Management history:  Previous antiarrhythmic drugs: none Previous cardioversions: 04/26/24 Previous ablations: none Anticoagulation history: Xarelto , Eliquis    Past Medical History:  Diagnosis Date   ALLERGIC RHINITIS 01/08/2010   Allergy    dust mites, pet dander   Atrial fibrillation with RVR (HCC) 01/19/2018   CERVICAL RADICULOPATHY, RIGHT 01/08/2010   CHEST PAIN 01/08/2010   DIVERTICULOSIS, COLON 01/08/2010   Dysrhythmia    post op   during PT a fib RVR   GERD (gastroesophageal reflux disease)    Zantac OTC  prn   GOUT 01/08/2010   in past/ when has surgery   HYPERLIPIDEMIA 01/08/2010   Hypertension    Lumbar disc disease 08/05/2011   Osteoarthritis of right knee    end stage   OSTEOARTHRITIS, KNEES, 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 180 tablet 3   atorvastatin  (LIPITOR) 10 MG tablet Take 1 tablet (10 mg total) by mouth daily. 90  tablet 3   Calcium  Polycarbophil (FIBER-CAPS PO) Take 2 tablets by mouth in the morning.     carvedilol  (COREG ) 3.125 MG tablet Take 1 tablet (3.125 mg total) by mouth 2 (two) times daily. 180 tablet 3   Cholecalciferol (VITAMIN D3) 50 MCG (2000 UT) capsule Take 4,000 Units by mouth daily.     fexofenadine (ALLEGRA) 180 MG tablet Take 180 mg by mouth in the morning and  at bedtime.     fluticasone  (FLONASE ) 50 MCG/ACT nasal spray Place 2 sprays into the nose daily as needed for allergies or rhinitis. For allergies.     lisinopril  (ZESTRIL ) 10 MG tablet Take 1 tablet (10 mg total) by mouth daily. 90 tablet 3   naproxen  sodium (ALEVE ) 220 MG tablet Take 440 mg by mouth 2 (two) times daily as needed (Pain).     predniSONE  (DELTASONE ) 20 MG tablet Take two tablets by mouth once daily for 5 days. 10 tablet 0   No current facility-administered medications for this encounter.    ROS- All systems are reviewed and negative except as per the HPI above.  Physical Exam: Vitals:   05/07/24 0952  BP: 122/88  Pulse: 80  Weight: 97.6 kg  Height: 5' 10 (1.778 m)    GEN: Well nourished, well developed in no acute distress CARDIAC: Irregularly irregular rate and rhythm, no murmurs, rubs, gallops RESPIRATORY:  Clear to auscultation without rales, wheezing or rhonchi  ABDOMEN: Soft, non-tender, non-distended EXTREMITIES:  No edema; No deformity    Wt Readings from Last 3 Encounters:  05/07/24 97.6 kg  04/26/24 94.3 kg  03/31/24 99.7 kg    EKG today demonstrates  Atypical atrial flutter with variable block Vent. rate 80 BPM PR interval * ms QRS duration 88 ms QT/QTcB 372/429 ms   Echo 01/07/21 demonstrated  1. Left ventricular ejection fraction, by estimation, is 50 to 55%. The  left ventricle has low normal function. The left ventricle has no regional  wall motion abnormalities. Left ventricular diastolic parameters were  normal.   2. Right ventricular systolic function is normal. The right ventricular  size is normal.   3. The mitral valve is grossly normal. No evidence of mitral valve  regurgitation.   4. The aortic valve is tricuspid. There is mild calcification of the  aortic valve. There is mild thickening of the aortic valve. Aortic valve  regurgitation is not visualized. No aortic stenosis is present.   5. Aortic dilatation noted. There is  mild to moderate dilatation of the  aortic root, measuring 44 mm. There is borderline dilatation of the  ascending aorta, measuring 37 mm. There is mild dilatation of the  transverse aorta, measuring 37 mm.   Comparison(s): A prior study was performed on 01/29/2018. Prior images  reviewed side by side. Slight decrease in LV function, aortic dilation  noted.   Epic records are reviewed at length today  CHA2DS2-VASc Score = 3  The patient's score is based upon: CHF History: 0 HTN History: 1 Diabetes History: 0 Stroke History: 0 Vascular Disease History: 1 (aortic atherosclerosis) Age Score: 1 Gender Score: 0       ASSESSMENT AND PLAN: Persistent Atrial Fibrillation/atrial flutter The patient's CHA2DS2-VASc score is 3, indicating a 3.2% annual risk of stroke.   S/p DCCV 04/26/24 with quick return of afib. He did have interim symptomatic improvement.  We discussed rhythm control options including AAD and ablation. Long term, he is interested in ablation, will refer to EP. Will start flecainide 50  mg BID. If he does not chemically convert, will increase dose and plan for repeat DCCV.  Continue Eliquis  5 mg BID Continue carvedilol  3.125 mg BID  Secondary Hypercoagulable State (ICD10:  D68.69) The patient is at significant risk for stroke/thromboembolism based upon his CHA2DS2-VASc Score of 3.  Continue Apixaban  (Eliquis ). No bleeding issues.    HTN Stable on current regimen   Follow up in the AF clinic next week for ECG.     Myrtha Ates PA-C Afib Clinic [facility] [address] [place], [place] [address] [phone] 05/07/2024 10:07 AM

## 2024-05-10 NOTE — Progress Notes (Unsigned)
 Electrophysiology Office Note:   Date:  05/12/2024  ID:  Todd Barrett., DOB 1951/11/23, MRN [id number]  Primary Cardiologist: Antoinette Batman, MD Electrophysiologist: Ardeen Kohler, MD      History of Present Illness:   Todd Barrett. is a 73 y.o. male with h/o HLD, HTN, OSA, atrial fibrillation who is being seen today for presents today for evaluation for catheter ablation.  Discussed the use of AI scribe software for clinical note transcription with the patient, who gave verbal consent to proceed. 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 He was treated with Cardizem  and then placed on Xarelto .  Cardioversion was planned but he went back into normal sinus rhythm prior to hospital discharge. Patient was seen by Dr Abel Hoe on 03/01/24 and found to be in rate controlled afib. He is s/p DCCV 04/26/24.  He reports symptomatic improvement following DCCV.  He experiences increased fatigue and dyspnea during episodes of atrial fibrillation, which limits his ability to perform physical activities. His atrial fibrillation symptoms have intensified recently, especially following physical exertion, leading to significant fatigue and lower back pain when walking. Unfortunately, he went back into atrial fibrillation and was started on flecainide 50 mg twice daily during recent visit with AF clinic on 05/07/2024.   Review of systems complete and found to be negative unless listed in HPI.   EP Information / Studies Reviewed:    EKG is ordered today. Personal review as below.  EKG Interpretation Date/Time:  Tuesday May 11 2024 09:35:32 EDT Ventricular Rate:  93 PR Interval:    QRS Duration:  86 QT Interval:  352 QTC Calculation: 437 R Axis:   22  Text Interpretation: Atrial fibrillation When compared with ECG  of 07-May-2024 09:54,  No significant change Confirmed by Ardeen Kohler [phone]) on 05/11/2024 10:04:48 AM   EKG 03/31/24:    Echo 01/07/21:   1. Left ventricular ejection fraction, by estimation, is 50 to 55%. The  left ventricle has low normal function. The left ventricle has no regional  wall motion abnormalities. Left ventricular diastolic parameters were  normal.   2. Right ventricular systolic function is normal. The right ventricular  size is normal.   3. The mitral valve is grossly normal. No evidence of mitral valve  regurgitation.   4. The aortic valve is tricuspid. There is mild calcification of the  aortic valve. There is mild thickening of the aortic valve. Aortic valve  regurgitation is not visualized. No aortic stenosis is present.   5. Aortic dilatation noted. 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 There is borderline dilatation of the  ascending aorta, measuring 37 mm. There is mild dilatation of the  transverse aorta, measuring 37 mm.   Coronary CTA 12/2020:  IMPRESSION: 1. Coronary calcium  score of 36. This was 29th percentile for age, sex, and race matched control.   2. Normal coronary origin with right dominance.   3. Aortic Atherosclerosis noted.   4. CAD-RADS 1. 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 Consider non-atherosclerotic causes of chest pain. Consider preventive therapy and risk factor modification.  Risk Assessment/Calculations:    CHA2DS2-VASc Score = 3   This indicates a 3.2% annual risk of stroke. The patient's score is based upon: CHF History: 0 HTN History: 1 Diabetes History: 0 Stroke  History: 0 Vascular Disease History: 1 (aortic atherosclerosis) Age Score: 1 Gender Score: 0             Physical Exam:   VS:  BP 126/81   Pulse 93   Ht 5' 10 (1.778 m)   Wt 210 lb (95.3 kg)   SpO2 95%   BMI 30.13 kg/m    Wt Readings from Last 3 Encounters:  05/11/24 210 lb (95.3 kg)   05/07/24 215 lb 3.2 oz (97.6 kg)  04/26/24 208 lb (94.3 kg)     GEN: Well nourished, well developed in no acute distress NECK: No JVD CARDIAC: Normal rate, irregular rhythm RESPIRATORY:  Clear to auscultation without rales, wheezing or rhonchi  ABDOMEN: Soft, non-distended EXTREMITIES:  No edema; No deformity   ASSESSMENT AND PLAN:    #. Persistent atrial fibrillation, symptomatic: Quick recurrence after last DCCV.  #. High risk medication use: Flecainide - normal QRS interval on ECG. -Discussed treatment options today for AF including antiarrhythmic drug therapy and ablation. Discussed risks, recovery and likelihood of success with each treatment strategy. Risk, benefits, and alternatives to EP study and ablation for afib were discussed. These risks include but are not limited to stroke, bleeding, vascular damage, tamponade, perforation, damage to the esophagus, lungs, phrenic nerve and other structures, pulmonary vein stenosis, worsening renal function, coronary vasospasm and death.  Discussed potential need for repeat ablation procedures and antiarrhythmic drugs after an initial ablation. The patient understands these risk and wishes to proceed.  We will therefore proceed with catheter ablation at the next available time.  Carto, ICE, anesthesia are requested for the procedure.  Will also obtain CT PV protocol prior to the procedure to exclude LAA thrombus and further evaluate atrial anatomy. -Increase flecainide to 100mg  BID. We will schedule cardioversion in 1 week. ECG to assess PR and QRS duration on higher dose of flecainide will performed at that time.  -Continue Eliquis  5 mg BID. -Continue carvedilol  3.125 mg BID.  #. Hypercoagulable state due to AF:  - Continue Eliquis  5mg  BID.   #Hypertension -At goal today.  Recommend checking blood pressures 1-2 times per week at home and recording the values.  Recommend bringing these recordings to the primary care physician.  Follow up  with Dr. Daneil Dunker 3 months after ablation.   Signed, Ardeen Kohler, MD

## 2024-05-10 NOTE — H&P (View-Only) (Signed)
 Electrophysiology Office Note:   Date:  05/12/2024  ID:  Todd Lyons., DOB 1951/11/23, MRN [id number]  Primary Cardiologist: Antoinette Batman, MD Electrophysiologist: Ardeen Kohler, MD      History of Present Illness:   Todd Dorko. is a 73 y.o. male with h/o HLD, HTN, OSA, atrial fibrillation who is being seen today for presents today for evaluation for catheter ablation.  Discussed the use of AI scribe software for clinical note transcription with the patient, who gave verbal consent to proceed. 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 He was treated with Cardizem  and then placed on Xarelto .  Cardioversion was planned but he went back into normal sinus rhythm prior to hospital discharge. Patient was seen by Dr Todd Barrett on 03/01/24 and found to be in rate controlled afib. He is s/p DCCV 04/26/24.  He reports symptomatic improvement following DCCV.  He experiences increased fatigue and dyspnea during episodes of atrial fibrillation, which limits his ability to perform physical activities. His atrial fibrillation symptoms have intensified recently, especially following physical exertion, leading to significant fatigue and lower back pain when walking. Unfortunately, he went back into atrial fibrillation and was started on flecainide 50 mg twice daily during recent visit with AF clinic on 05/07/2024.   Review of systems complete and found to be negative unless listed in HPI.   EP Information / Studies Reviewed:    EKG is ordered today. Personal review as below.  EKG Interpretation Date/Time:  Tuesday May 11 2024 09:35:32 EDT Ventricular Rate:  93 PR Interval:    QRS Duration:  86 QT Interval:  352 QTC Calculation: 437 R Axis:   22  Text Interpretation: Atrial fibrillation When compared with ECG  of 07-May-2024 09:54,  No significant change Confirmed by Ardeen Kohler [phone]) on 05/11/2024 10:04:48 AM   EKG 03/31/24:    Echo 01/07/21:   1. Left ventricular ejection fraction, by estimation, is 50 to 55%. The  left ventricle has low normal function. The left ventricle has no regional  wall motion abnormalities. Left ventricular diastolic parameters were  normal.   2. Right ventricular systolic function is normal. The right ventricular  size is normal.   3. The mitral valve is grossly normal. No evidence of mitral valve  regurgitation.   4. The aortic valve is tricuspid. There is mild calcification of the  aortic valve. There is mild thickening of the aortic valve. Aortic valve  regurgitation is not visualized. No aortic stenosis is present.   5. Aortic dilatation noted. 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 There is borderline dilatation of the  ascending aorta, measuring 37 mm. There is mild dilatation of the  transverse aorta, measuring 37 mm.   Coronary CTA 12/2020:  IMPRESSION: 1. Coronary calcium  score of 36. This was 29th percentile for age, sex, and race matched control.   2. Normal coronary origin with right dominance.   3. Aortic Atherosclerosis noted.   4. CAD-RADS 1. 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 Consider non-atherosclerotic causes of chest pain. Consider preventive therapy and risk factor modification.  Risk Assessment/Calculations:    CHA2DS2-VASc Score = 3   This indicates a 3.2% annual risk of stroke. The patient's score is based upon: CHF History: 0 HTN History: 1 Diabetes History: 0 Stroke  History: 0 Vascular Disease History: 1 (aortic atherosclerosis) Age Score: 1 Gender Score: 0             Physical Exam:   VS:  BP 126/81   Pulse 93   Ht 5' 10 (1.778 m)   Wt 210 lb (95.3 kg)   SpO2 95%   BMI 30.13 kg/m    Wt Readings from Last 3 Encounters:  05/11/24 210 lb (95.3 kg)   05/07/24 215 lb 3.2 oz (97.6 kg)  04/26/24 208 lb (94.3 kg)     GEN: Well nourished, well developed in no acute distress NECK: No JVD CARDIAC: Normal rate, irregular rhythm RESPIRATORY:  Clear to auscultation without rales, wheezing or rhonchi  ABDOMEN: Soft, non-distended EXTREMITIES:  No edema; No deformity   ASSESSMENT AND PLAN:    #. Persistent atrial fibrillation, symptomatic: Quick recurrence after last DCCV.  #. High risk medication use: Flecainide - normal QRS interval on ECG. -Discussed treatment options today for AF including antiarrhythmic drug therapy and ablation. Discussed risks, recovery and likelihood of success with each treatment strategy. Risk, benefits, and alternatives to EP study and ablation for afib were discussed. These risks include but are not limited to stroke, bleeding, vascular damage, tamponade, perforation, damage to the esophagus, lungs, phrenic nerve and other structures, pulmonary vein stenosis, worsening renal function, coronary vasospasm and death.  Discussed potential need for repeat ablation procedures and antiarrhythmic drugs after an initial ablation. The patient understands these risk and wishes to proceed.  We will therefore proceed with catheter ablation at the next available time.  Carto, ICE, anesthesia are requested for the procedure.  Will also obtain CT PV protocol prior to the procedure to exclude LAA thrombus and further evaluate atrial anatomy. -Increase flecainide to 100mg  BID. We will schedule cardioversion in 1 week. ECG to assess PR and QRS duration on higher dose of flecainide will performed at that time.  -Continue Eliquis  5 mg BID. -Continue carvedilol  3.125 mg BID.  #. Hypercoagulable state due to AF:  - Continue Eliquis  5mg  BID.   #Hypertension -At goal today.  Recommend checking blood pressures 1-2 times per week at home and recording the values.  Recommend bringing these recordings to the primary care physician.  Follow up  with Dr. Daneil Dunker 3 months after ablation.   Signed, Ardeen Kohler, MD

## 2024-05-11 ENCOUNTER — Ambulatory Visit: Attending: Cardiology | Admitting: Cardiology

## 2024-05-11 ENCOUNTER — Encounter: Payer: Self-pay | Admitting: Cardiology

## 2024-05-11 ENCOUNTER — Other Ambulatory Visit: Payer: Self-pay

## 2024-05-11 VITALS — BP 126/81 | HR 93 | Ht 70.0 in | Wt 210.0 lb

## 2024-05-11 DIAGNOSIS — I4819 Other persistent atrial fibrillation: Secondary | ICD-10-CM | POA: Diagnosis not present

## 2024-05-11 DIAGNOSIS — I10 Essential (primary) hypertension: Secondary | ICD-10-CM

## 2024-05-11 DIAGNOSIS — Z79899 Other long term (current) drug therapy: Secondary | ICD-10-CM

## 2024-05-11 DIAGNOSIS — D6869 Other thrombophilia: Secondary | ICD-10-CM | POA: Diagnosis not present

## 2024-05-11 MED ORDER — FLECAINIDE ACETATE 100 MG PO TABS: 100.0000 mg | ORAL_TABLET | Freq: Two times a day (BID) | ORAL | 3 refills | Status: DC

## 2024-05-11 NOTE — Patient Instructions (Addendum)
 Medication Instructions:  Your physician has recommended you make the following change in your medication:  1) INCREASE flecainide to 100 mg twice daily  *If you need a refill on your cardiac medications before your next appointment, please call your pharmacy*  Lab Work: BMET and CBC - please have these done the week of August 11th at any [facility] location  Testing/Procedures: Cardioversion  Your physician has recommended that you have a Cardioversion (DCCV). Electrical Cardioversion uses a jolt of electricity to your heart either through paddles or wired patches attached to your chest. This is a controlled, usually prescheduled, procedure. Defibrillation is done under light anesthesia in the hospital, and you usually go home the day of the procedure. This is done to get your heart back into a normal rhythm. You are not awake for the procedure. Please see the instruction sheet given to you today.  Cardiac CT Your physician has requested that you have cardiac CT. Cardiac computed tomography (CT) is a painless test that uses an x-ray machine to take clear, detailed pictures of your heart. For further information please visit [email or web address]  We will call you to schedule your CT scan. It will be done about three weeks prior to your ablation.  Ablation Your physician has recommended that you have an ablation. Catheter ablation is a medical procedure used to treat some cardiac arrhythmias (irregular heartbeats). During catheter ablation, a long, thin, flexible tube is put into a blood vessel in your groin (upper thigh), or neck. This tube is called an ablation catheter. It is then guided to your heart through the blood vessel. Radio frequency waves destroy small areas of heart tissue where abnormal heartbeats may cause an arrhythmia to start. You are scheduled for Atrial Fibrillation Ablation on Friday, September 5 with Dr. Clinton Danas.Please arrive at the Main Entrance A at [facility]:  [address] [place], [place] [address] at 8:00 AM   Follow-Up: At [facility], you and your health needs are our priority.  As part of our continuing mission to provide you with exceptional heart care, we have created designated Provider Care Teams.  These Care Teams include your primary Cardiologist (physician) and Advanced Practice Providers (APPs -  Physician Assistants and Nurse Practitioners) who all work together to provide you with the care you need, when you need it.   Your next appointment:   We will contact you about your post-procedure follow up appointments.     ;

## 2024-05-14 ENCOUNTER — Ambulatory Visit (HOSPITAL_COMMUNITY): Admitting: Physician Assistant

## 2024-05-18 NOTE — Progress Notes (Signed)
 Spoke to patient and instructed them to come at 0800  and to be NPO after 0000.  Medications reviewed.    Confirmed that patient will have a ride home and someone to stay with them for 24 hours after the procedure.

## 2024-05-18 NOTE — Anesthesia Preprocedure Evaluation (Signed)
 Anesthesia Evaluation  Patient identified by MRN, date of birth, ID band Patient awake    Reviewed: Allergy & Precautions, NPO status , Patient's Chart, lab work & pertinent test results  History of Anesthesia Complications Negative for: history of anesthetic complications  Airway Mallampati: III  TM Distance: >3 FB Neck ROM: Full   Comment: Previous grade II view with Miller 3, easy mask Dental  (+) Dental Advisory Given   Pulmonary neg pulmonary ROS   Pulmonary exam normal breath sounds clear to auscultation       Cardiovascular hypertension (carvedilol , lisinopril ), Pt. on home beta blockers and Pt. on medications (-) angina + CAD  (-) Past MI, (-) Cardiac Stents and (-) CABG + dysrhythmias Atrial Fibrillation  Rhythm:Irregular Rate:Normal  HLD  TTE 01/07/2021: IMPRESSIONS    1. Left ventricular ejection fraction, by estimation, is 50 to 55%. The  left ventricle has low normal function. The left ventricle has no regional  wall motion abnormalities. Left ventricular diastolic parameters were  normal.   2. Right ventricular systolic function is normal. The right ventricular  size is normal.   3. The mitral valve is grossly normal. No evidence of mitral valve  regurgitation.   4. The aortic valve is tricuspid. There is mild calcification of the  aortic valve. There is mild thickening of the aortic valve. Aortic valve  regurgitation is not visualized. No aortic stenosis is present.   5. Aortic dilatation noted. 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 There is borderline dilatation of the  ascending aorta, measuring 37 mm. There is mild dilatation of the  transverse aorta, measuring 37 mm.     Neuro/Psych neg Seizures PSYCHIATRIC DISORDERS Anxiety      Neuromuscular disease (cervical radiculopathy s/p ACDF)    GI/Hepatic Neg liver ROS,GERD  ,,Diverticulosis    Endo/Other  negative  endocrine ROS    Renal/GU negative Renal ROS     Musculoskeletal  (+) Arthritis , Osteoarthritis,    Abdominal   Peds  Hematology negative hematology ROS (+) Lab Results      Component                Value               Date                      WBC                      5.8                 03/31/2024                HGB                      14.5                03/31/2024                HCT                      41.3                03/31/2024                MCV  89                  03/31/2024                PLT                      193                 03/31/2024              Anesthesia Other Findings Last Eliquis : this morning  Reproductive/Obstetrics                             Anesthesia Physical Anesthesia Plan  ASA: 3  Anesthesia Plan: General   Post-op Pain Management: Minimal or no pain anticipated   Induction: Intravenous  PONV Risk Score and Plan: 2 and TIVA and Treatment may vary due to age or medical condition  Airway Management Planned: Natural Airway and Nasal Cannula  Additional Equipment:   Intra-op Plan:   Post-operative Plan:   Informed Consent: I have reviewed the patients History and Physical, chart, labs and discussed the procedure including the risks, benefits and alternatives for the proposed anesthesia with the patient or authorized representative who has indicated his/her understanding and acceptance.     Dental advisory given  Plan Discussed with: CRNA and Anesthesiologist  Anesthesia Plan Comments: (Risks of general anesthesia discussed including, but not limited to, sore throat, hoarse voice, chipped/damaged teeth, injury to vocal cords, nausea and vomiting, allergic reactions, lung infection, heart attack, stroke, and death. All questions answered. )        Anesthesia Quick Evaluation

## 2024-05-19 ENCOUNTER — Ambulatory Visit (HOSPITAL_COMMUNITY): Payer: Self-pay | Admitting: Anesthesiology

## 2024-05-19 ENCOUNTER — Ambulatory Visit (HOSPITAL_BASED_OUTPATIENT_CLINIC_OR_DEPARTMENT_OTHER): Payer: Self-pay | Admitting: Anesthesiology

## 2024-05-19 ENCOUNTER — Ambulatory Visit (HOSPITAL_COMMUNITY)
Admission: RE | Admit: 2024-05-19 | Discharge: 2024-05-19 | Disposition: A | Discharge status: Home / Self Care | Attending: Cardiovascular Disease | Admitting: Cardiovascular Disease

## 2024-05-19 ENCOUNTER — Other Ambulatory Visit: Payer: Self-pay

## 2024-05-19 ENCOUNTER — Encounter (HOSPITAL_COMMUNITY)
Admission: RE | Disposition: A | Discharge status: Home / Self Care | Payer: Self-pay | Source: Home / Self Care | Attending: Cardiovascular Disease

## 2024-05-19 ENCOUNTER — Encounter (HOSPITAL_COMMUNITY): Payer: Self-pay | Admitting: Cardiovascular Disease

## 2024-05-19 DIAGNOSIS — G4733 Obstructive sleep apnea (adult) (pediatric): Secondary | ICD-10-CM | POA: Diagnosis not present

## 2024-05-19 DIAGNOSIS — I4891 Unspecified atrial fibrillation: Secondary | ICD-10-CM

## 2024-05-19 DIAGNOSIS — Z01818 Encounter for other preprocedural examination: Secondary | ICD-10-CM

## 2024-05-19 DIAGNOSIS — E785 Hyperlipidemia, unspecified: Secondary | ICD-10-CM

## 2024-05-19 DIAGNOSIS — Z96659 Presence of unspecified artificial knee joint: Secondary | ICD-10-CM | POA: Insufficient documentation

## 2024-05-19 DIAGNOSIS — D6869 Other thrombophilia: Secondary | ICD-10-CM | POA: Diagnosis not present

## 2024-05-19 DIAGNOSIS — Z7901 Long term (current) use of anticoagulants: Secondary | ICD-10-CM | POA: Insufficient documentation

## 2024-05-19 DIAGNOSIS — I2510 Atherosclerotic heart disease of native coronary artery without angina pectoris: Secondary | ICD-10-CM | POA: Diagnosis not present

## 2024-05-19 DIAGNOSIS — I10 Essential (primary) hypertension: Secondary | ICD-10-CM

## 2024-05-19 DIAGNOSIS — I4819 Other persistent atrial fibrillation: Secondary | ICD-10-CM | POA: Diagnosis not present

## 2024-05-19 DIAGNOSIS — Z79899 Other long term (current) drug therapy: Secondary | ICD-10-CM | POA: Diagnosis not present

## 2024-05-19 HISTORY — PX: CARDIOVERSION: EP1203

## 2024-05-19 LAB — POCT I-STAT, CHEM 8
BUN: 16 mg/dL (ref 8–23)
Calcium, Ion: 1.28 mmol/L (ref 1.15–1.40)
Chloride: 106 mmol/L (ref 98–111)
Creatinine, Ser: 1 mg/dL (ref 0.61–1.24)
Glucose, Bld: 115 mg/dL — ABNORMAL HIGH (ref 70–99)
HCT: 39 % (ref 39.0–52.0)
Hemoglobin: 13.3 g/dL (ref 13.0–17.0)
Potassium: 4.2 mmol/L (ref 3.5–5.1)
Sodium: 141 mmol/L (ref 135–145)
TCO2: 23 mmol/L (ref 22–32)

## 2024-05-19 SURGERY — CARDIOVERSION (CATH LAB)
Anesthesia: General

## 2024-05-19 MED ORDER — LIDOCAINE 2% (20 MG/ML) 5 ML SYRINGE
INTRAMUSCULAR | Status: DC | PRN
  Administered 2024-05-19: 60 mg via INTRAVENOUS

## 2024-05-19 MED ORDER — PROPOFOL 10 MG/ML IV BOLUS
INTRAVENOUS | Status: DC | PRN
  Administered 2024-05-19: 80 mg via INTRAVENOUS

## 2024-05-19 MED ORDER — SODIUM CHLORIDE 0.9 % IV SOLN: INTRAVENOUS | Status: DC

## 2024-05-19 SURGICAL SUPPLY — 1 items: PAD DEFIB RADIO PHYSIO CONN (PAD) ×1 IMPLANT

## 2024-05-19 NOTE — CV Procedure (Signed)
 Electrical Cardioversion Procedure Note Jacobus Colvin [id number] 1951-06-15  Procedure: Electrical Cardioversion Indications:  Atrial Fibrillation  Procedure Details Consent: Risks of procedure as well as the alternatives and risks of each were explained to the (patient/caregiver).  Consent for procedure obtained. Time Out: Verified patient identification, verified procedure, site/side was marked, verified correct patient position, special equipment/implants available, medications/allergies/relevent history reviewed, required imaging and test results available.  Performed  Patient placed on cardiac monitor, pulse oximetry, supplemental oxygen as necessary.  Sedation given: propofol  Pacer pads placed anterior and posterior chest.  Cardioverted 1 time(s).  Cardioverted at 200J.  Evaluation Findings: Post procedure EKG shows: NSR Complications: None Patient did tolerate procedure well.   Annabella Scarce, MD 05/19/2024, 9:20 AM

## 2024-05-19 NOTE — Transfer of Care (Signed)
 Immediate Anesthesia Transfer of Care Note  Patient: Todd Barrett.  Procedure(s) Performed: CARDIOVERSION  Patient Location: PACU  Anesthesia Type:General  Level of Consciousness: awake, alert , and oriented  Airway & Oxygen Therapy: Patient Spontanous Breathing and Patient connected to face mask oxygen  Post-op Assessment: Report given to RN and Post -op Vital signs reviewed and stable  Post vital signs: Reviewed and stable  Last Vitals:  Vitals Value Taken Time  BP    Temp    Pulse    Resp    SpO2      Last Pain:  Vitals:   05/19/24 0807  TempSrc:   PainSc: 0-No pain         Complications: No notable events documented.

## 2024-05-19 NOTE — Anesthesia Postprocedure Evaluation (Signed)
 Anesthesia Post Note  Patient: Todd Barrett.  Procedure(s) Performed: CARDIOVERSION     Patient location during evaluation: PACU Anesthesia Type: General Level of consciousness: awake Pain management: pain level controlled Vital Signs Assessment: post-procedure vital signs reviewed and stable Respiratory status: spontaneous breathing, nonlabored ventilation and respiratory function stable Cardiovascular status: blood pressure returned to baseline and stable Postop Assessment: no apparent nausea or vomiting Anesthetic complications: no   No notable events documented.  Last Vitals:  Vitals:   05/19/24 0800 05/19/24 0922  BP: 119/88 103/69  Pulse: 85 (!) 59  Resp: 13 20  Temp: 36.5 C   SpO2: 98% 98%    Last Pain:  Vitals:   05/19/24 0922  TempSrc:   PainSc: 0-No pain                 Delon Aisha Arch

## 2024-05-19 NOTE — Interval H&P Note (Signed)
 History and Physical Interval Note:  05/19/2024 9:07 AM  Todd Barrett.  has presented today for surgery, with the diagnosis of AFIB.  The various methods of treatment have been discussed with the patient and family. After consideration of risks, benefits and other options for treatment, the patient has consented to  Procedure(s): CARDIOVERSION (N/A) as a surgical intervention.  The patient's history has been reviewed, patient examined, no change in status, stable for surgery.  I have reviewed the patient's chart and labs.  Questions were answered to the patient's satisfaction.     Annabella Scarce, MD

## 2024-07-06 ENCOUNTER — Ambulatory Visit: Payer: Medicare Other

## 2024-07-06 VITALS — Ht 70.0 in | Wt 210.0 lb

## 2024-07-06 DIAGNOSIS — Z0000 Encounter for general adult medical examination without abnormal findings: Secondary | ICD-10-CM

## 2024-07-06 NOTE — Progress Notes (Signed)
 Subjective:   Todd Felch. is a 73 y.o. who presents for a Medicare Wellness preventive visit.  As a reminder, Annual Wellness Visits don't include a physical exam, and some assessments may be limited, especially if this visit is performed virtually. We may recommend an in-person follow-up visit with your provider if needed.  Visit Complete: Virtual I connected with  Todd Barrett. on 07/06/24 by a audio enabled telemedicine application and verified that I am speaking with the correct person using two identifiers.  Patient Location: Home  Provider Location: Office/Clinic  I discussed the limitations of evaluation and management by telemedicine. The patient expressed understanding and agreed to proceed.  Vital Signs: Because this visit was a virtual/telehealth visit, some criteria may be missing or patient reported. Any vitals not documented were not able to be obtained and vitals that have been documented are patient reported.  VideoDeclined- This patient declined Librarian, academic. Therefore the visit was completed with audio only.  Persons Participating in Visit: Patient.  AWV Questionnaire: Yes: Patient Medicare AWV questionnaire was completed by the patient on 07/05/2024; I have confirmed that all information answered by patient is correct and no changes since this date.  Cardiac Risk Factors include: advanced age (>18men, >78 women);dyslipidemia;male gender;hypertension     Objective:    Today's Vitals   07/06/24 1309  Weight: 210 lb (95.3 kg)  Height: 5' 10 (1.778 m)   Body mass index is 30.13 kg/m.     07/06/2024    1:09 PM 05/19/2024    8:08 AM 04/26/2024    6:45 AM 07/03/2023    3:56 PM 05/01/2023   11:32 AM 04/23/2023    9:54 AM 06/24/2022    8:22 AM  Advanced Directives  Does Patient Have a Medical Advance Directive? Yes Yes Yes Yes No No Yes  Type of Estate agent of [place];Living will Healthcare  Power of [place];Living will Healthcare Power of [place];Living will Healthcare Power of [place];Living will   Healthcare Power of [place];Living will  Copy of Healthcare Power of Attorney in Chart? No - copy requested   No - copy requested   No - copy requested  Would patient like information on creating a medical advance directive?     No - Patient declined      Current Medications (verified) Outpatient Encounter Medications as of 07/06/2024  Medication Sig   apixaban  (ELIQUIS ) 5 MG TABS tablet Take 1 tablet (5 mg total) by mouth 2 (two) times daily.   atorvastatin  (LIPITOR) 10 MG tablet Take 1 tablet (10 mg total) by mouth daily.   Calcium  Polycarbophil (FIBER-CAPS PO) Take 2 tablets by mouth in the morning.   carvedilol  (COREG ) 3.125 MG tablet Take 1 tablet (3.125 mg total) by mouth 2 (two) times daily.   Cholecalciferol (VITAMIN D3) 50 MCG (2000 UT) capsule Take 4,000 Units by mouth daily.   fexofenadine (ALLEGRA) 180 MG tablet Take 180 mg by mouth in the morning and at bedtime.   flecainide  (TAMBOCOR ) 100 MG tablet Take 1 tablet (100 mg total) by mouth 2 (two) times daily.   fluticasone  (FLONASE ) 50 MCG/ACT nasal spray Place 2 sprays into the nose daily as needed for allergies or rhinitis. For allergies.   lisinopril  (ZESTRIL ) 10 MG tablet Take 1 tablet (10 mg total) by mouth daily.   naproxen  sodium (ALEVE ) 220 MG tablet Take 660 mg by mouth 2 (two) times daily as needed (Pain).   No facility-administered encounter medications on  file as of 07/06/2024.    Allergies (verified) Dust mite extract, Drug ingredient [bee pollen], and Other   History: Past Medical History:  Diagnosis Date   ALLERGIC RHINITIS 01/08/2010   Allergy    dust mites, pet dander   Atrial fibrillation with RVR (HCC) 01/19/2018   CERVICAL RADICULOPATHY, RIGHT 01/08/2010   CHEST PAIN 01/08/2010   DIVERTICULOSIS, COLON 01/08/2010   Dysrhythmia    post op   during PT a fib RVR   GERD (gastroesophageal  reflux disease)    Zantac OTC  prn   GOUT 01/08/2010   in past/ when has surgery   HYPERLIPIDEMIA 01/08/2010   Hypertension    Lumbar disc disease 08/05/2011   Osteoarthritis of right knee    end stage   OSTEOARTHRITIS, KNEES, 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   Past Surgical History:  Procedure Laterality Date   ANTERIOR CERVICAL DECOMP/DISCECTOMY FUSION  07/23/2012   Procedure: ANTERIOR CERVICAL DECOMPRESSION/DISCECTOMY FUSION 2 LEVELS;  Surgeon: Lynwood JONELLE Mill, MD;  Location: [facility] NEURO ORS;  Service: Neurosurgery;  Laterality: N/A;  Cervical three-four, four-five, redo Cervical six-seven anterior cervical decompression, discectomy fusion with allograft and plating   BACK SURGERY  03/2004, 02/2005   CARDIOVERSION N/A 04/26/2024   Procedure: CARDIOVERSION;  Surgeon: Shlomo Wilbert JONELLE, MD;  Location: [facility] INVASIVE CV LAB;  Service: Cardiovascular;  Laterality: N/A;   CARDIOVERSION N/A 05/19/2024   Procedure: CARDIOVERSION;  Surgeon: Raford Riggs, MD;  Location: [facility] INVASIVE CV LAB;  Service: Cardiovascular;  Laterality: N/A;   CERVICAL SPINE SURGERY  05/2010   CHOLECYSTECTOMY N/A 01/11/2021   Procedure: LAPAROSCOPIC CHOLECYSTECTOMY;  Surgeon: Rubin Calamity, MD;  [facility];  Service: General;  Laterality: N/A;   clavicle facture  nov. 2009   100% displacement   COLONOSCOPY     ERCP N/A 01/10/2021   Procedure: ENDOSCOPIC RETROGRADE CHOLANGIOPANCREATOGRAPHY (ERCP);  Surgeon: Aneita Gwendlyn DASEN, MD;  Location: [facility] ENDOSCOPY;  Service: Endoscopy;  Laterality: N/A;   HERNIA REPAIR     inguinal hernia/right side/ 73 years old   JOINT REPLACEMENT     Left knee 2006   KNEE ARTHROSCOPY     total of 5   left knee arthroscopy  september 2005   lumbar disc surgury  02/2002   lumbar disc surgury  june 2004   NASAL RECONSTRUCTION     x 3   REMOVAL OF STONES  01/10/2021   Procedure: REMOVAL OF STONES;  Surgeon: Aneita Gwendlyn DASEN, MD;  Location: [facility] ENDOSCOPY;  Service: Endoscopy;;   REVERSE SHOULDER ARTHROPLASTY  Left 07/30/2018   Procedure: LEFT REVERSE SHOULDER ARTHROPLASTY;  Surgeon: Melita Drivers, MD;  Location: [facility] OR;  Service: Orthopedics;  Laterality: Left;    s/p ENT surgury     x 3   s/p left shoulder surgury  feb. 2009   s/p left TKR  april 2006   SPHINCTEROTOMY  01/10/2021   Procedure: SPHINCTEROTOMY;  Surgeon: Aneita Gwendlyn DASEN, MD;  Location: [facility] ENDOSCOPY;  Service: Endoscopy;;   TOTAL HIP ARTHROPLASTY Right 05/01/2023   Procedure: TOTAL HIP ARTHROPLASTY ANTERIOR APPROACH;  Surgeon: Fidel Rogue, MD;  Location: [facility] ORS;  Service: Orthopedics;  Laterality: Right;  140   TOTAL KNEE ARTHROPLASTY Right 01/16/2018   Procedure: RIGHT TOTAL KNEE ARTHROPLASTY;  Surgeon: Kay Kemps, MD;  Location: [name] OR;  Service: Orthopedics;  Laterality: Right;   TOTAL SHOULDER ARTHROPLASTY Right 09/16/2019   Procedure: Right Reverse shoulder arthroplasty;  Surgeon: Melita Drivers, MD;  Location: [facility] ORS;  Service: Orthopedics;  Laterality: Right;  Family History  Problem Relation Age of Onset   Bradycardia Father 12       Pacemaker   Lymphoma Mother    Alzheimer's disease Mother    Pancreatic cancer Brother    Colon cancer Neg Hx    Social History   Socioeconomic History   Marital status: Married    Spouse name: Not on file   Number of children: 0   Years of education: Not on file   Highest education level: Bachelor's degree (e.g., BA, AB, BS)  Occupational History   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 [employer]    Employer: [employer]  Tobacco Use   Smoking status: Never   Smokeless tobacco: Never   Tobacco comments:    Never smoked 03/31/24  Vaping Use   Vaping status: Never Used  Substance and Sexual Activity   Alcohol  use: Yes    Alcohol /week: 12.0 standard drinks of alcohol     Types: 12 Cans of beer per week    Comment: occ beer   Drug use: Never   Sexual activity: Yes  Other Topics Concern   Not on file  Social History Narrative   Not on file   Social Drivers of Health    Financial Resource Strain: Low Risk  (07/06/2024)   Overall Financial Resource Strain (CARDIA)    Difficulty of Paying Living Expenses: Not hard at all  Food Insecurity: No Food Insecurity (07/06/2024)   Hunger Vital Sign    Worried About Running Out of Food in the Last Year: Never true    Ran Out of Food in the Last Year: Never true  Transportation Needs: No Transportation Needs (07/06/2024)   PRAPARE - Administrator, Civil Service (Medical): No    Lack of Transportation (Non-Medical): No  Physical Activity: Sufficiently Active (07/06/2024)   Exercise Vital Sign    Days of Exercise per Week: 5 days    Minutes of Exercise per Session: 30 min  Stress: No Stress Concern Present (07/06/2024)   [employer] of Occupational Health - Occupational Stress Questionnaire    Feeling of Stress: Only a little  Social Connections: Moderately Integrated (07/06/2024)   Social Connection and Isolation Panel    Frequency of Communication with Friends and Family: More than three times a week    Frequency of Social Gatherings with Friends and Family: Three times a week    Attends Religious Services: 1 to 4 times per year    Active Member of Clubs or Organizations: No    Attends Banker Meetings: Never    Marital Status: Married    Tobacco Counseling Counseling given: No Tobacco comments: Never smoked 03/31/24    Clinical Intake:  Pre-visit preparation completed: Yes  Pain : No/denies pain     BMI - recorded: 30.13 Nutritional Status: BMI > 30  Obese Nutritional Risks: None Diabetes: No  Lab Results  Component Value Date   HGBA1C 5.6 01/01/2024   HGBA1C 5.8 12/24/2022   HGBA1C 5.5 12/21/2021     How often do you need to have someone help you when you read instructions, pamphlets, or other written materials from your doctor or pharmacy?: 1 - Never  Interpreter Needed?: No  Information entered by :: Verdie Saba, CMA   Activities of Daily Living      07/06/2024    1:12 PM 05/19/2024    8:06 AM  In your present state of health, do you have any difficulty performing the following activities:  Hearing? 0 0  Vision? 0 0  Difficulty concentrating or making decisions? 0 0  Walking or climbing stairs? 0   Dressing or bathing? 0   Doing errands, shopping? 0   Preparing Food and eating ? N   Using the Toilet? N   In the past six months, have you accidently leaked urine? N   Do you have problems with loss of bowel control? N   Managing your Medications? N   Managing your Finances? N   Housekeeping or managing your Housekeeping? N     Patient Care Team: Norleen Lynwood ORN, MD as PCP - General Verlin Lonni BIRCH, MD as PCP - Cardiology (Cardiology) Kennyth Chew, MD as PCP - Electrophysiology (Cardiology) Waylan Cain, MD as Consulting Physician (Ophthalmology) Verlin Lonni BIRCH, MD as Consulting Physician (Cardiology)  I have updated your Care Teams any recent Medical Services you may have received from other providers in the past year.     Assessment:   This is a routine wellness examination for Todd Barrett.  Hearing/Vision screen Hearing Screening - Comments:: Denies hearing difficulties   Vision Screening - Comments:: Wears eyeglasses for reading- up to date with routine eye exams with Dr Cain Waylan   Goals Addressed               This Visit's Progress     Patient Stated (pt-stated)        Patient stated he plans to continue being active        Depression Screen     07/06/2024    1:14 PM 01/01/2024    9:13 AM 07/03/2023    3:53 PM 12/24/2022   10:29 AM 12/24/2022   10:02 AM 06/24/2022    8:23 AM 06/24/2022    8:21 AM  PHQ 2/9 Scores  PHQ - 2 Score 0 0 0 0 2 0 0  PHQ- 9 Score 0    6      Fall Risk     07/06/2024    1:12 PM 01/01/2024    9:20 AM 07/03/2023    3:54 PM 12/24/2022   10:28 AM 12/24/2022   10:03 AM  Fall Risk   Falls in the past year? 1 1 1 1 1   Number falls in past yr: 1 1 0 1 1  Comment >4       Injury with Fall? 0 0 0 0 0  Risk for fall due to : History of fall(s) History of fall(s) Impaired balance/gait;Orthopedic patient  Impaired mobility  Follow up Falls evaluation completed;Falls prevention discussed Falls evaluation completed Falls prevention discussed  Falls evaluation completed    MEDICARE RISK AT HOME:  Medicare Risk at Home Any stairs in or around the home?: Yes If so, are there any without handrails?: No Home free of loose throw rugs in walkways, pet beds, electrical cords, etc?: Yes Adequate lighting in your home to reduce risk of falls?: Yes Life alert?: No Use of a cane, walker or w/c?: No Grab bars in the bathroom?: Yes Shower chair or bench in shower?: Yes Elevated toilet seat or a handicapped toilet?: Yes  TIMED UP AND GO:  Was the test performed?  No  Cognitive Function: 6CIT completed        07/06/2024    1:15 PM 07/03/2023    3:56 PM 06/24/2022    8:27 AM  6CIT Screen  What Year? 0 points 0 points 0 points  What month? 0 points 0 points 0 points  What time? 0 points 0 points 0 points  Count back from 20 0 points 0 points 0 points  Months in reverse 0 points 0 points 0 points  Repeat phrase 0 points 0 points 0 points  Total Score 0 points 0 points 0 points    Immunizations Immunization History  Administered Date(s) Administered   Fluad Quad(high Dose 65+) 12/21/2019, 10/15/2023   Influenza Split 10/05/2012   Influenza Whole 08/05/2011   Influenza, High Dose Seasonal PF 12/16/2017, 07/31/2018   Influenza,inj,Quad PF,6+ Mos 11/24/2013, 11/29/2014, 12/01/2015, 12/04/2016   Influenza,inj,quad, With Preservative 11/26/2018   Influenza-Unspecified 12/20/2020, 10/10/2021, 10/21/2022   PFIZER Comirnaty(Gray Top)Covid-19 Tri-Sucrose Vaccine 01/17/2020, 02/14/2020, 08/07/2020   PFIZER(Purple Top)SARS-COV-2 Vaccination 10/21/2022   Pneumococcal Conjugate-13 12/15/2015   Pneumococcal Polysaccharide-23 12/04/2016   Td 12/27/2007   Tdap 12/17/2018     Screening Tests Health Maintenance  Topic Date Due   Diabetic kidney evaluation - Urine ACR  Never done   Zoster Vaccines- Shingrix (1 of 2) Never done   COVID-19 Vaccine (5 - 2024-25 season) 07/27/2023   INFLUENZA VACCINE  06/25/2024   HEMOGLOBIN A1C  06/30/2024   OPHTHALMOLOGY EXAM  12/28/2024   FOOT EXAM  12/31/2024   Diabetic kidney evaluation - eGFR measurement  03/31/2025   Medicare Annual Wellness (AWV)  07/06/2025   Colonoscopy  03/05/2027   DTaP/Tdap/Td (3 - Td or Tdap) 12/17/2028   Pneumococcal Vaccine: 50+ Years  Completed   Hepatitis C Screening  Completed   Hepatitis B Vaccines  Aged Out   HPV VACCINES  Aged Out   Meningococcal B Vaccine  Aged Out    Health Maintenance  Health Maintenance Due  Topic Date Due   Diabetic kidney evaluation - Urine ACR  Never done   Zoster Vaccines- Shingrix (1 of 2) Never done   COVID-19 Vaccine (5 - 2024-25 season) 07/27/2023   INFLUENZA VACCINE  06/25/2024   HEMOGLOBIN A1C  06/30/2024   Health Maintenance Items Addressed:  07/06/2024  Additional Screening:  Vision Screening: Recommended annual ophthalmology exams for early detection of glaucoma and other disorders of the eye. Would you like a referral to an eye doctor? No  Patient stated has had an eye exam w/Dr Adine Haddock in 2025.  Dental Screening: Recommended annual dental exams for proper oral hygiene  Community Resource Referral / Chronic Care Management: CRR required this visit?  No   CCM required this visit?  No   Plan:    I have personally reviewed and noted the following in the patient's chart:   Medical and social history Use of alcohol , tobacco or illicit drugs  Current medications and supplements including opioid prescriptions. Patient is not currently taking opioid prescriptions. Functional ability and status Nutritional status Physical activity Advanced directives List of other physicians Hospitalizations, surgeries, and ER visits in  previous 12 months Vitals Screenings to include cognitive, depression, and falls Referrals and appointments  In addition, I have reviewed and discussed with patient certain preventive protocols, quality metrics, and best practice recommendations. A written personalized care plan for preventive services as well as general preventive health recommendations were provided to patient.   Verdie CHRISTELLA Saba, CMA   07/06/2024   After Visit Summary: (MyChart) Due to this being a telephonic visit, the after visit summary with patients personalized plan was offered to patient via MyChart   Notes: Nothing significant to report at this time.

## 2024-07-06 NOTE — Patient Instructions (Addendum)
 Todd Barrett , Thank you for taking time out of your busy schedule to complete your Annual Wellness Visit with me. I enjoyed our conversation and look forward to speaking with you again next year. I, as well as your care team,  appreciate your ongoing commitment to your health goals. Please review the following plan we discussed and let me know if I can assist you in the future. Your Game plan/ To Do List    Referrals: If you haven't heard from the office you've been referred to, please reach out to them at the phone provided.   Follow up Visits: We will see or speak with you next year for your Next Medicare AWV with our clinical staff Have you seen your provider in the last 6 months (3 months if uncontrolled diabetes)? No  Clinician Recommendations:  Aim for 30 minutes of exercise or brisk walking, 6-8 glasses of water , and 5 servings of fruits and vegetables each day. Educated and advised on getting the Shingles vaccines in 2025.      This is a list of the screenings recommended for you:  Health Maintenance  Topic Date Due   Yearly kidney health urinalysis for diabetes  Never done   Zoster (Shingles) Vaccine (1 of 2) Never done   COVID-19 Vaccine (5 - 2024-25 season) 07/27/2023   Flu Shot  06/25/2024   Hemoglobin A1C  06/30/2024   Eye exam for diabetics  12/28/2024   Complete foot exam   12/31/2024   Yearly kidney function blood test for diabetes  03/31/2025   Medicare Annual Wellness Visit  07/06/2025   Colon Cancer Screening  03/05/2027   DTaP/Tdap/Td vaccine (3 - Td or Tdap) 12/17/2028   Pneumococcal Vaccine for age over 21  Completed   Hepatitis C Screening  Completed   Hepatitis B Vaccine  Aged Out   HPV Vaccine  Aged Out   Meningitis B Vaccine  Aged Out    Advanced directives: (Copy Requested) Please bring a copy of your health care power of attorney and living will to the office to be added to your chart at your convenience. You can mail to [facility] [address] [place], [place] [address] or email to [email or web address] Advance Care Planning is important because it:  [x]  Makes sure you receive the medical care that is consistent with your values, goals, and preferences  [x]  It provides guidance to your family and loved ones and reduces their decisional burden about whether or not they are making the right decisions based on your wishes.  Follow the link provided in your after visit summary or read over the paperwork we have mailed to you to help you started getting your Advance Directives in place. If you need assistance in completing these, please reach out to us  so that we can help you!

## 2024-07-09 ENCOUNTER — Ambulatory Visit (HOSPITAL_COMMUNITY)
Admission: RE | Admit: 2024-07-09 | Discharge: 2024-07-09 | Disposition: A | Discharge status: Home / Self Care | Source: Ambulatory Visit | Attending: Cardiology | Admitting: Cardiology

## 2024-07-09 DIAGNOSIS — I517 Cardiomegaly: Secondary | ICD-10-CM | POA: Diagnosis not present

## 2024-07-09 DIAGNOSIS — I10 Essential (primary) hypertension: Secondary | ICD-10-CM | POA: Diagnosis not present

## 2024-07-09 DIAGNOSIS — D6869 Other thrombophilia: Secondary | ICD-10-CM | POA: Diagnosis not present

## 2024-07-09 DIAGNOSIS — I4819 Other persistent atrial fibrillation: Secondary | ICD-10-CM | POA: Insufficient documentation

## 2024-07-09 DIAGNOSIS — I700 Atherosclerosis of aorta: Secondary | ICD-10-CM | POA: Diagnosis not present

## 2024-07-09 MED ORDER — IOHEXOL 350 MG/ML SOLN
80.0000 mL | Freq: Once | INTRAVENOUS | Status: AC | PRN
  Administered 2024-07-09: 80 mL via INTRAVENOUS

## 2024-07-23 ENCOUNTER — Telehealth (HOSPITAL_COMMUNITY): Payer: Self-pay

## 2024-07-23 NOTE — Telephone Encounter (Signed)
 Spoke with patient to discuss upcoming procedure.   CT: completed.  Labs: will complete 07/27/24.   Any recent signs of acute illness or been started on antibiotics? No Any new medications started?No Any medications to hold?  No Any missed doses of blood thinner?  No Advised patient to continue taking Eliquis  (Apixaban ) twice daily without missing any doses.  Medication instructions:  On the morning of your procedure DO NOT take any medication., including Eliquis  (Apixaban ) or the procedure may be rescheduled. Nothing to eat or drink after midnight prior to your procedure.  Confirmed patient is scheduled for Atrial Fibrillation Ablation on Friday, September 5 with Dr. Sidra Kitty. Instructed patient to arrive at the Main Entrance A at [facility]: [address] [place], [place] [address] and check in at Admitting at 8:00 AM.   Advised of plan to go home the same day and will only stay overnight if medically necessary. You MUST have a responsible adult to drive you home and MUST be with you the first 24 hours after you arrive home or your procedure could be cancelled.  Informed patient a nurse will call a day before the procedure to confirm arrival time and ensure instructions are followed.  Patient verbalized understanding to all instructions provided and agreed to proceed with procedure.   Advised patient to contact RN Navigator at [phone], to inform of any new medications started after call or concerns prior to procedure.

## 2024-07-28 LAB — BASIC METABOLIC PANEL WITH GFR
BUN/Creatinine Ratio: 16 (ref 10–24)
BUN: 18 mg/dL (ref 8–27)
CO2: 22 mmol/L (ref 20–29)
Calcium: 9.6 mg/dL (ref 8.6–10.2)
Chloride: 104 mmol/L (ref 96–106)
Creatinine, Ser: 1.13 mg/dL (ref 0.76–1.27)
Glucose: 92 mg/dL (ref 70–99)
Potassium: 5.1 mmol/L (ref 3.5–5.2)
Sodium: 141 mmol/L (ref 134–144)
eGFR: 69 mL/min/1.73 (ref >59)

## 2024-07-28 LAB — CBC
Hematocrit: 43 % (ref 37.5–51.0)
Hemoglobin: 14.1 g/dL (ref 13.0–17.7)
MCH: 30.9 pg (ref 26.6–33.0)
MCHC: 32.8 g/dL (ref 31.5–35.7)
MCV: 94 fL (ref 79–97)
Platelets: 201 x10E3/uL (ref 150–450)
RBC: 4.57 x10E6/uL (ref 4.14–5.80)
RDW: 13.3 % (ref 11.6–15.4)
WBC: 5 x10E3/uL (ref 3.4–10.8)

## 2024-07-29 NOTE — Pre-Procedure Instructions (Signed)
 Attempted to call patient regarding procedure instructions.  Left voicemail on the following items: Arrival time 0800 Nothing to eat or drink after midnight No meds AM of procedure Responsible person to drive you home and stay with you for 24 hrs  Have you missed any doses of anti-coagulant Eliquis- should be taken twice a day, if you have missed any doses please let us know.  Don't take dose morning of procedure.

## 2024-07-30 ENCOUNTER — Ambulatory Visit (HOSPITAL_COMMUNITY)
Admission: RE | Admit: 2024-07-30 | Discharge: 2024-07-30 | Disposition: A | Discharge status: Home / Self Care | Attending: Cardiology | Admitting: Cardiology

## 2024-07-30 ENCOUNTER — Other Ambulatory Visit: Payer: Self-pay

## 2024-07-30 ENCOUNTER — Ambulatory Visit (HOSPITAL_COMMUNITY)

## 2024-07-30 ENCOUNTER — Encounter (HOSPITAL_COMMUNITY)
Admission: RE | Disposition: A | Discharge status: Home / Self Care | Payer: Self-pay | Source: Home / Self Care | Attending: Cardiology

## 2024-07-30 ENCOUNTER — Encounter (HOSPITAL_COMMUNITY): Payer: Self-pay | Admitting: Cardiology

## 2024-07-30 DIAGNOSIS — D6869 Other thrombophilia: Secondary | ICD-10-CM | POA: Diagnosis not present

## 2024-07-30 DIAGNOSIS — Z7901 Long term (current) use of anticoagulants: Secondary | ICD-10-CM | POA: Insufficient documentation

## 2024-07-30 DIAGNOSIS — Z79899 Other long term (current) drug therapy: Secondary | ICD-10-CM | POA: Diagnosis not present

## 2024-07-30 DIAGNOSIS — F419 Anxiety disorder, unspecified: Secondary | ICD-10-CM

## 2024-07-30 DIAGNOSIS — I4819 Other persistent atrial fibrillation: Secondary | ICD-10-CM | POA: Diagnosis not present

## 2024-07-30 DIAGNOSIS — I2510 Atherosclerotic heart disease of native coronary artery without angina pectoris: Secondary | ICD-10-CM | POA: Diagnosis not present

## 2024-07-30 DIAGNOSIS — Z96659 Presence of unspecified artificial knee joint: Secondary | ICD-10-CM | POA: Insufficient documentation

## 2024-07-30 DIAGNOSIS — I10 Essential (primary) hypertension: Secondary | ICD-10-CM

## 2024-07-30 DIAGNOSIS — I4891 Unspecified atrial fibrillation: Secondary | ICD-10-CM

## 2024-07-30 HISTORY — PX: ATRIAL FIBRILLATION ABLATION: EP1191

## 2024-07-30 LAB — GLUCOSE, CAPILLARY: Glucose-Capillary: 104 mg/dL — ABNORMAL HIGH (ref 70–99)

## 2024-07-30 MED ORDER — PROTAMINE SULFATE 10 MG/ML IV SOLN
INTRAVENOUS | Status: DC | PRN
  Administered 2024-07-30: 35 mg via INTRAVENOUS

## 2024-07-30 MED ORDER — CEFAZOLIN SODIUM-DEXTROSE 2-4 GM/100ML-% IV SOLN
INTRAVENOUS | Status: AC
  Filled 2024-07-30: qty 100

## 2024-07-30 MED ORDER — PROPOFOL 10 MG/ML IV BOLUS
INTRAVENOUS | Status: DC | PRN
  Administered 2024-07-30: 160 mg via INTRAVENOUS

## 2024-07-30 MED ORDER — MIDAZOLAM HCL 2 MG/2ML IJ SOLN
INTRAMUSCULAR | Status: DC | PRN
  Administered 2024-07-30: 2 mg via INTRAVENOUS

## 2024-07-30 MED ORDER — DEXAMETHASONE SODIUM PHOSPHATE 10 MG/ML IJ SOLN
INTRAMUSCULAR | Status: DC | PRN
  Administered 2024-07-30: 10 mg via INTRAVENOUS

## 2024-07-30 MED ORDER — LIDOCAINE 2% (20 MG/ML) 5 ML SYRINGE
INTRAMUSCULAR | Status: DC | PRN
  Administered 2024-07-30: 40 mg via INTRAVENOUS

## 2024-07-30 MED ORDER — HEPARIN (PORCINE) IN NACL 1000-0.9 UT/500ML-% IV SOLN
INTRAVENOUS | Status: DC | PRN
Start: 2024-07-30 — End: 2024-07-30
  Administered 2024-07-30 (×3): 500 mL

## 2024-07-30 MED ORDER — SODIUM CHLORIDE 0.9% FLUSH: 3.0000 mL | INTRAVENOUS | Status: DC | PRN

## 2024-07-30 MED ORDER — SODIUM CHLORIDE 0.9 % IV SOLN: INTRAVENOUS | Status: DC

## 2024-07-30 MED ORDER — ONDANSETRON HCL 4 MG/2ML IJ SOLN
INTRAMUSCULAR | Status: DC | PRN
  Administered 2024-07-30: 4 mg via INTRAVENOUS

## 2024-07-30 MED ORDER — ACETAMINOPHEN 325 MG PO TABS
650.0000 mg | ORAL_TABLET | ORAL | Status: DC | PRN
  Administered 2024-07-30: 650 mg via ORAL

## 2024-07-30 MED ORDER — FENTANYL CITRATE (PF) 100 MCG/2ML IJ SOLN
INTRAMUSCULAR | Status: AC
  Filled 2024-07-30: qty 2

## 2024-07-30 MED ORDER — APIXABAN 5 MG PO TABS
5.0000 mg | ORAL_TABLET | Freq: Once | ORAL | Status: AC
  Administered 2024-07-30: 5 mg via ORAL
  Filled 2024-07-30: qty 1

## 2024-07-30 MED ORDER — ACETAMINOPHEN 325 MG PO TABS
ORAL_TABLET | ORAL | Status: AC
  Filled 2024-07-30: qty 2

## 2024-07-30 MED ORDER — FENTANYL CITRATE (PF) 250 MCG/5ML IJ SOLN
INTRAMUSCULAR | Status: DC | PRN
  Administered 2024-07-30: 100 ug via INTRAVENOUS

## 2024-07-30 MED ORDER — ROCURONIUM BROMIDE 10 MG/ML (PF) SYRINGE
PREFILLED_SYRINGE | INTRAVENOUS | Status: DC | PRN
  Administered 2024-07-30: 30 mg via INTRAVENOUS
  Administered 2024-07-30: 10 mg via INTRAVENOUS
  Administered 2024-07-30: 60 mg via INTRAVENOUS

## 2024-07-30 MED ORDER — SODIUM CHLORIDE 0.9% FLUSH: 3.0000 mL | Freq: Two times a day (BID) | INTRAVENOUS | Status: DC

## 2024-07-30 MED ORDER — ONDANSETRON HCL 4 MG/2ML IJ SOLN
4.0000 mg | Freq: Four times a day (QID) | INTRAMUSCULAR | Status: DC | PRN
Start: 2024-07-30 — End: 2024-07-30

## 2024-07-30 MED ORDER — SUGAMMADEX SODIUM 200 MG/2ML IV SOLN
INTRAVENOUS | Status: DC | PRN
  Administered 2024-07-30: 400 mg via INTRAVENOUS

## 2024-07-30 MED ORDER — ATROPINE SULFATE 1 MG/ML IV SOLN
INTRAVENOUS | Status: DC | PRN
  Administered 2024-07-30: 1 mg via INTRAVENOUS

## 2024-07-30 MED ORDER — HEPARIN SODIUM (PORCINE) 1000 UNIT/ML IJ SOLN
INTRAMUSCULAR | Status: DC | PRN
Start: 2024-07-30 — End: 2024-07-30
  Administered 2024-07-30: 1000 [IU] via INTRAVENOUS
  Administered 2024-07-30: 15000 [IU] via INTRAVENOUS

## 2024-07-30 MED ORDER — SODIUM CHLORIDE 0.9 % IV SOLN: 250.0000 mL | INTRAVENOUS | Status: DC | PRN

## 2024-07-30 MED ORDER — MIDAZOLAM HCL 2 MG/2ML IJ SOLN
INTRAMUSCULAR | Status: AC
Start: 2024-07-30 — End: 2024-07-30
  Filled 2024-07-30: qty 2

## 2024-07-30 MED ORDER — PHENYLEPHRINE HCL-NACL 20-0.9 MG/250ML-% IV SOLN
INTRAVENOUS | Status: DC | PRN
  Administered 2024-07-30: 20 ug/min via INTRAVENOUS

## 2024-07-30 MED ORDER — PHENYLEPHRINE 80 MCG/ML (10ML) SYRINGE FOR IV PUSH (FOR BLOOD PRESSURE SUPPORT)
PREFILLED_SYRINGE | INTRAVENOUS | Status: DC | PRN
  Administered 2024-07-30 (×2): 160 ug via INTRAVENOUS

## 2024-07-30 MED ORDER — CEFAZOLIN SODIUM-DEXTROSE 2-4 GM/100ML-% IV SOLN
2.0000 g | Freq: Once | INTRAVENOUS | Status: AC
  Administered 2024-07-30: 2 g via INTRAVENOUS

## 2024-07-30 NOTE — Discharge Instructions (Signed)

## 2024-07-30 NOTE — Progress Notes (Signed)
 Patient arrived to holding area bay 19. Patient drowsy but answers questions and follows commands appropriately. No neuro deficits noted. Bilateral groin sites dry/intact with no bleeding or hematoma present. Post activity and precautions explained; will continue to monitor and reinforce per orders and protocol. EKG and CBG (104) obtained.Todd Barrett E

## 2024-07-30 NOTE — Anesthesia Preprocedure Evaluation (Signed)
 Anesthesia Evaluation  Patient identified by MRN, date of birth, ID band Patient awake    Reviewed: Allergy & Precautions, NPO status , Patient's Chart, lab work & pertinent test results  History of Anesthesia Complications Negative for: history of anesthetic complications  Airway Mallampati: III  TM Distance: >3 FB Neck ROM: Full   Comment: Previous grade II view with Miller 3, easy mask Dental  (+) Dental Advisory Given   Pulmonary neg pulmonary ROS   Pulmonary exam normal breath sounds clear to auscultation       Cardiovascular hypertension, Pt. on home beta blockers and Pt. on medications (-) angina + CAD  (-) Past MI, (-) Cardiac Stents and (-) CABG + dysrhythmias Atrial Fibrillation  Rhythm:Irregular Rate:Normal  HLD  TTE 01/07/2021: IMPRESSIONS    1. Left ventricular ejection fraction, by estimation, is 50 to 55%. The  left ventricle has low normal function. The left ventricle has no regional  wall motion abnormalities. Left ventricular diastolic parameters were  normal.   2. Right ventricular systolic function is normal. The right ventricular  size is normal.   3. The mitral valve is grossly normal. No evidence of mitral valve  regurgitation.   4. The aortic valve is tricuspid. There is mild calcification of the  aortic valve. There is mild thickening of the aortic valve. Aortic valve  regurgitation is not visualized. No aortic stenosis is present.   5. Aortic dilatation noted. 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 There is borderline dilatation of the  ascending aorta, measuring 37 mm. There is mild dilatation of the  transverse aorta, measuring 37 mm.     Neuro/Psych neg Seizures PSYCHIATRIC DISORDERS Anxiety      Neuromuscular disease (cervical radiculopathy s/p ACDF)    GI/Hepatic Neg liver ROS,GERD  ,,Diverticulosis    Endo/Other  negative endocrine ROS     Renal/GU negative Renal ROS     Musculoskeletal  (+) Arthritis , Osteoarthritis,    Abdominal   Peds  Hematology negative hematology ROS (+) Lab Results      Component                Value               Date                      WBC                      5.8                 03/31/2024                HGB                      14.5                03/31/2024                HCT                      41.3                03/31/2024                MCV                      89  03/31/2024                PLT                      193                 03/31/2024              Anesthesia Other Findings Last Eliquis : this morning  Reproductive/Obstetrics                              Anesthesia Physical Anesthesia Plan  ASA: 3  Anesthesia Plan: General   Post-op Pain Management: Minimal or no pain anticipated   Induction: Intravenous  PONV Risk Score and Plan: 2 and Treatment may vary due to age or medical condition, Dexamethasone  and Ondansetron   Airway Management Planned: Oral ETT  Additional Equipment:   Intra-op Plan:   Post-operative Plan: Extubation in OR  Informed Consent: I have reviewed the patients History and Physical, chart, labs and discussed the procedure including the risks, benefits and alternatives for the proposed anesthesia with the patient or authorized representative who has indicated his/her understanding and acceptance.     Dental advisory given  Plan Discussed with: CRNA and Anesthesiologist  Anesthesia Plan Comments:          Anesthesia Quick Evaluation

## 2024-07-30 NOTE — Anesthesia Procedure Notes (Addendum)
 Procedure Name: Intubation Date/Time: 07/30/2024 10:07 AM  Performed by: Elby Raelene SAUNDERS, CRNAPre-anesthesia Checklist: Patient identified, Emergency Drugs available, Suction available and Patient being monitored Patient Re-evaluated:Patient Re-evaluated prior to induction Oxygen Delivery Method: Circle System Utilized Preoxygenation: Pre-oxygenation with 100% oxygen Induction Type: IV induction Ventilation: Mask ventilation without difficulty Laryngoscope Size: Miller and 2 Grade View: Grade I Tube type: Oral Tube size: 7.5 mm Number of attempts: 1 Airway Equipment and Method: Stylet, Oral airway and Bite block Placement Confirmation: ETT inserted through vocal cords under direct vision, positive ETCO2 and breath sounds checked- equal and bilateral Secured at: 23 cm Tube secured with: Tape Dental Injury: Teeth and Oropharynx as per pre-operative assessment

## 2024-07-30 NOTE — Anesthesia Postprocedure Evaluation (Signed)
 Anesthesia Post Note  Patient: Todd Barrett.  Procedure(s) Performed: ATRIAL FIBRILLATION ABLATION     Patient location during evaluation: PACU Anesthesia Type: General Level of consciousness: awake Pain management: pain level controlled Vital Signs Assessment: post-procedure vital signs reviewed and stable Respiratory status: spontaneous breathing, nonlabored ventilation and respiratory function stable Cardiovascular status: blood pressure returned to baseline and stable Postop Assessment: no apparent nausea or vomiting Anesthetic complications: no   There were no known notable events for this encounter.  Last Vitals:  Vitals:   07/30/24 1215 07/30/24 1227  BP: 115/72   Pulse: 64   Resp: 11   Temp:  (!) 35.3 C  SpO2: 96%     Last Pain:  Vitals:   07/30/24 1227  TempSrc: Tympanic  PainSc: 3                  Delon Aisha Arch

## 2024-07-30 NOTE — Transfer of Care (Signed)
 Immediate Anesthesia Transfer of Care Note  Patient: Todd Barrett.  Procedure(s) Performed: ATRIAL FIBRILLATION ABLATION  Patient Location: Cath Lab  Anesthesia Type:General  Level of Consciousness: awake, alert , and oriented  Airway & Oxygen Therapy: Patient Spontanous Breathing and Patient connected to face mask oxygen  Post-op Assessment: Report given to RN and Post -op Vital signs reviewed and stable  Post vital signs: Reviewed and stable  Last Vitals:  Vitals Value Taken Time  BP    Temp    Pulse    Resp    SpO2      Last Pain:  Vitals:   07/30/24 0819  TempSrc: Oral  PainSc: 0-No pain         Complications: There were no known notable events for this encounter.

## 2024-07-30 NOTE — H&P (Signed)
 Electrophysiology Note:   Date:  07/30/24  ID:  Todd MARLA Merlynn Mickey., DOB [birth year], MRN [id number]   Primary Cardiologist: Lonni Cash, MD Electrophysiologist: Fonda Kitty, MD       History of Present Illness:   Todd Paule. is a 73 y.o. male with h/o HLD, HTN, OSA, atrial fibrillation who is being seen today for presents today for evaluation for catheter ablation.   Discussed the use of AI scribe software for clinical note transcription with the patient, who gave verbal consent to proceed. 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 He was treated with Cardizem  and then placed on Xarelto .  Cardioversion was planned but he went back into normal sinus rhythm prior to hospital discharge. Patient was seen by Dr Cash on 03/01/24 and found to be in rate controlled afib. He is s/p DCCV 04/26/24.  He reports symptomatic improvement following DCCV.  He experiences increased fatigue and dyspnea during episodes of atrial fibrillation, which limits his ability to perform physical activities. His atrial fibrillation symptoms have intensified recently, especially following physical exertion, leading to significant fatigue and lower back pain when walking. Unfortunately, he went back into atrial fibrillation and was started on flecainide  50 mg twice daily during recent visit with AF clinic on 05/07/2024.    Interval: Patient presents today for scheduled ablation. Reports feeling relatively well. No new or acute complaints.   Review of systems complete and found to be negative unless listed in HPI.    EP Information / Studies Reviewed:      EKG Interpretation Date/Time:                  Tuesday May 11 2024 09:35:32 EDT Ventricular Rate:         93 PR Interval:                   QRS Duration:             86 QT  Interval:                 352 QTC Calculation:437 R Axis:                         22   Text Interpretation:      Atrial fibrillation When compared with ECG of 07-May-2024 09:54,  No significant change Confirmed by Kitty Fonda [phone]) on 05/11/2024 10:04:48 AM    EKG 03/31/24:     Echo 01/07/21:   1. Left ventricular ejection fraction, by estimation, is 50 to 55%. The  left ventricle has low normal function. The left ventricle has no regional  wall motion abnormalities. Left ventricular diastolic parameters were  normal.   2. Right ventricular systolic function is normal. The right ventricular  size is normal.   3. The mitral valve is grossly normal. No evidence of mitral valve  regurgitation.   4. The aortic valve is tricuspid. There is mild calcification of the  aortic valve. There is mild thickening of the aortic valve. Aortic valve  regurgitation is not visualized. No aortic stenosis is present.   5. Aortic dilatation noted. 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 There is borderline dilatation of the  ascending aorta, measuring 37 mm. There is mild dilatation of  the  transverse aorta, measuring 37 mm.    Coronary CTA 12/2020:  IMPRESSION: 1. Coronary calcium  score of 36. This was 29th percentile for age, sex, and race matched control.   2. Normal coronary origin with right dominance.   3. Aortic Atherosclerosis noted.   4. CAD-RADS 1. 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 Consider non-atherosclerotic causes of chest pain. Consider preventive therapy and risk factor modification.   Risk Assessment/Calculations:     CHA2DS2-VASc Score = 3   This indicates a 3.2% annual risk of stroke. The patient's score is based upon: CHF History: 0 HTN History: 1 Diabetes History: 0 Stroke History: 0 Vascular Disease History: 1 (aortic atherosclerosis) Age Score: 1 Gender Score: 0               Physical Exam:    Today's  Vitals   07/30/24 0819  BP: 130/87  Pulse: 75  Resp: 18  Temp: 97.8 F (36.6 C)  TempSrc: Oral  SpO2: 97%  Weight: 97.5 kg  Height: 5' 10 (1.778 m)  PainSc: 0-No pain   Body mass index is 30.85 kg/m.  GEN: Well nourished, well developed in no acute distress NECK: No JVD CARDIAC: Normal rate, regular rhythm RESPIRATORY:  Clear to auscultation without rales, wheezing or rhonchi  ABDOMEN: Soft, non-distended EXTREMITIES:  No edema; No deformity    ASSESSMENT AND PLAN:     #. Persistent atrial fibrillation, symptomatic: Quick recurrence after last DCCV.  #. High risk medication use: Flecainide  - normal QRS interval on ECG. -Discussed treatment options today for AF including antiarrhythmic drug therapy and ablation. Discussed risks, recovery and likelihood of success with each treatment strategy. Risk, benefits, and alternatives to EP study and ablation for afib were discussed. These risks include but are not limited to stroke, bleeding, vascular damage, tamponade, perforation, damage to the esophagus, lungs, phrenic nerve and other structures, pulmonary vein stenosis, worsening renal function, coronary vasospasm and death.  Discussed potential need for repeat ablation procedures and antiarrhythmic drugs after an initial ablation. The patient understands these risk and wishes to proceed today. -Continue flecainide  for 1 month. -Continue Eliquis  5 mg BID. -Continue carvedilol  3.125 mg BID.   #. Hypercoagulable state due to AF:  - Continue Eliquis  5mg  BID.     Follow up with Dr. Kennyth 3 months after ablation.    Signed, Fonda Kennyth, MD

## 2024-07-30 NOTE — Progress Notes (Signed)
 Took over care at 1500 fro Vivian,RN. Assessed patient during handoff.

## 2024-08-01 ENCOUNTER — Encounter (HOSPITAL_COMMUNITY): Payer: Self-pay | Admitting: Cardiology

## 2024-08-02 ENCOUNTER — Telehealth (HOSPITAL_COMMUNITY): Payer: Self-pay

## 2024-08-02 LAB — POCT ACTIVATED CLOTTING TIME: Activated Clotting Time: 320 s

## 2024-08-02 MED FILL — Fentanyl Citrate Preservative Free (PF) Inj 100 MCG/2ML: INTRAMUSCULAR | Qty: 2 | Status: AC

## 2024-08-02 NOTE — Telephone Encounter (Signed)
 Spoke with patient to complete post procedure follow up call.  Patient reports no complications with groin sites.   Instructions reviewed with patient:  It is normal to have bruising, tenderness, mild swelling, and a pea or marble sized lump/knot at the groin site which can take up to three months to resolve.  Get help right away if you notice sudden swelling at the puncture site.  Check your puncture site every day for signs of infection: fever, redness, swelling, pus drainage, warmth, foul odor or excessive pain. If this occurs, please call [phone], to speak with the RN Navigator. Get help right away if your puncture site is bleeding and the bleeding does not stop after applying firm pressure to the area.  You may continue to have skipped beats/ atrial fibrillation during the first several months after your procedure.  It is very important not to miss any doses of your blood thinner Eliquis .    You will follow up with the Afib clinic on 08/27/24 and follow up with the APP on 11/01/24.   Patient verbalized understanding to all instructions provided.

## 2024-08-10 ENCOUNTER — Encounter: Payer: Self-pay | Admitting: Internal Medicine

## 2024-08-10 ENCOUNTER — Ambulatory Visit (INDEPENDENT_AMBULATORY_CARE_PROVIDER_SITE_OTHER): Admitting: Internal Medicine

## 2024-08-10 VITALS — BP 144/72 | HR 66 | Temp 98.1°F | Ht 70.0 in | Wt 221.4 lb

## 2024-08-10 DIAGNOSIS — M79662 Pain in left lower leg: Secondary | ICD-10-CM | POA: Diagnosis not present

## 2024-08-10 DIAGNOSIS — E7800 Pure hypercholesterolemia, unspecified: Secondary | ICD-10-CM | POA: Diagnosis not present

## 2024-08-10 DIAGNOSIS — F32A Depression, unspecified: Secondary | ICD-10-CM | POA: Diagnosis not present

## 2024-08-10 DIAGNOSIS — I10 Essential (primary) hypertension: Secondary | ICD-10-CM

## 2024-08-10 DIAGNOSIS — Z7901 Long term (current) use of anticoagulants: Secondary | ICD-10-CM

## 2024-08-10 DIAGNOSIS — I4891 Unspecified atrial fibrillation: Secondary | ICD-10-CM | POA: Diagnosis not present

## 2024-08-10 DIAGNOSIS — M7989 Other specified soft tissue disorders: Secondary | ICD-10-CM | POA: Diagnosis not present

## 2024-08-10 DIAGNOSIS — R739 Hyperglycemia, unspecified: Secondary | ICD-10-CM | POA: Diagnosis not present

## 2024-08-10 DIAGNOSIS — F419 Anxiety disorder, unspecified: Secondary | ICD-10-CM

## 2024-08-10 MED ORDER — CITALOPRAM HYDROBROMIDE 20 MG PO TABS: 20.0000 mg | ORAL_TABLET | Freq: Every day | ORAL | 3 refills | Status: DC

## 2024-08-10 MED ORDER — HYDROCHLOROTHIAZIDE 12.5 MG PO CAPS: 12.5000 mg | ORAL_CAPSULE | Freq: Every day | ORAL | 3 refills | Status: DC

## 2024-08-10 NOTE — Assessment & Plan Note (Signed)
 Cont eliquis  asd

## 2024-08-10 NOTE — Assessment & Plan Note (Signed)
 BP Readings from Last 3 Encounters:  08/10/24 (!) 144/72  07/30/24 120/84  07/09/24 124/79   Uncontrolled, likely reactive,  pt to continue medical treatment coreg  3.125 mg bid, lisinopril  10 mg qd

## 2024-08-10 NOTE — Assessment & Plan Note (Signed)
 Lab Results  Component Value Date   LDLCALC 65 01/01/2024   Stable, pt to continue current statin lipitor 10 mg qd

## 2024-08-10 NOTE — Patient Instructions (Addendum)
 Please take all new medication as prescribed - the celexa  20 mg per dayd, and HCT 12.5 mg every day (fluid pill)  Please continue all other medications as before, and refills have been done if requested.  Please have the pharmacy call with any other refills you may need.  Please continue your efforts at being more active, low cholesterol diet, and weight control.  Please keep your appointments with your specialists as you may have planned  You will be contacted regarding the referral for: Venous doppler for the left leg  You will be contacted regarding the referral for: Psychology (counseling)  We can hold on lab testing today  Please make an Appointment to return in 6 months, or sooner if needed

## 2024-08-10 NOTE — Assessment & Plan Note (Signed)
 Lab Results  Component Value Date   HGBA1C 5.6 01/01/2024   Stable, pt to continue current medical treatment  - diet, wt control

## 2024-08-10 NOTE — Assessment & Plan Note (Addendum)
 Can't r/o DVT with recent procedure though is currently taking eliquis  - now for venous doppler LLE asap

## 2024-08-10 NOTE — Assessment & Plan Note (Signed)
 S/p ablation to f/u cardiology soon

## 2024-08-10 NOTE — Progress Notes (Signed)
 Patient ID: Todd Barrett., male   DOB: Feb 25, 1951, 73 y.o.   MRN: [id number]        Chief Complaint: follow up with worsening anxiety depression, hyperglycemia, hld, htn, hyperglycemia       HPI:  Todd Barrett. is a 73 y.o. male here with worsening social and financial pressures and marital discord now overwhelming with anxiety depression, though no panic or SI or HI.   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  Pt states good compliance with eliquis , and has had mild recurring LLE swelling, but today seems worse than usual with tightness and discomfort.  Pt denies chest pain, wheezing, orthopnea, PND, increased LE swelling, palpitations, dizziness or syncope.  Pt denies polydipsia, polyuria, or new focal neuro s/s.    Pt denies fever, wt loss, night sweats, loss of appetite, or other constitutional symptoms         Wt Readings from Last 3 Encounters:  08/10/24 221 lb 6.4 oz (100.4 kg)  07/30/24 215 lb (97.5 kg)  07/06/24 210 lb (95.3 kg)   BP Readings from Last 3 Encounters:  08/10/24 (!) 144/72  07/30/24 120/84  07/09/24 124/79         Past Medical History:  Diagnosis Date   ALLERGIC RHINITIS 01/08/2010   Allergy    dust mites, pet dander   Anxiety    Atrial fibrillation with RVR (HCC) 01/19/2018   CERVICAL RADICULOPATHY, RIGHT 01/08/2010   CHEST PAIN 01/08/2010   DIVERTICULOSIS, COLON 01/08/2010   Dysrhythmia    post op   during PT a fib RVR   GERD (gastroesophageal reflux disease)    Zantac OTC  prn   GOUT 01/08/2010   in past/ when has surgery   HYPERLIPIDEMIA 01/08/2010   Hypertension    Lumbar disc disease 08/05/2011   Osteoarthritis of right knee    end stage   OSTEOARTHRITIS, KNEES, 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   Past Surgical History:  Procedure Laterality Date    ANTERIOR CERVICAL DECOMP/DISCECTOMY FUSION  07/23/2012   Procedure: ANTERIOR CERVICAL DECOMPRESSION/DISCECTOMY FUSION 2 LEVELS;  Surgeon: Lynwood JONELLE Mill, MD;  Location: [facility] NEURO ORS;  Service: Neurosurgery;  Laterality: N/A;  Cervical three-four, four-five, redo Cervical six-seven anterior cervical decompression, discectomy fusion with allograft and plating   ATRIAL FIBRILLATION ABLATION N/A 07/30/2024   Procedure: ATRIAL FIBRILLATION ABLATION;  Surgeon: Kennyth Chew, MD;  Location: [facility] INVASIVE CV LAB;  Service: Cardiovascular;  Laterality: N/A;   BACK SURGERY  03/2004, 02/2005   CARDIOVERSION N/A 04/26/2024   Procedure: CARDIOVERSION;  Surgeon: Shlomo Wilbert JONELLE, MD;  Location: [facility] INVASIVE CV LAB;  Service: Cardiovascular;  Laterality: N/A;   CARDIOVERSION N/A 05/19/2024   Procedure: CARDIOVERSION;  Surgeon: Raford Riggs, MD;  Location: [facility] INVASIVE CV LAB;  Service: Cardiovascular;  Laterality: N/A;   CERVICAL SPINE SURGERY  05/2010   CHOLECYSTECTOMY N/A 01/11/2021   Procedure: LAPAROSCOPIC CHOLECYSTECTOMY;  Surgeon: Rubin Calamity, MD;  Location: [facility] OR;  Service: General;  Laterality: N/A;   clavicle facture  09/2008   100% displacement   COLONOSCOPY     ERCP N/A 01/10/2021   Procedure: ENDOSCOPIC RETROGRADE CHOLANGIOPANCREATOGRAPHY (ERCP);  Surgeon: Aneita Gwendlyn DASEN, MD;  Location: [facility] ENDOSCOPY;  Service: Endoscopy;  Laterality: N/A;   HERNIA REPAIR     inguinal hernia/right side/ 73 years old  JOINT REPLACEMENT     Left knee 2006   KNEE ARTHROSCOPY     total of 5   left knee arthroscopy  07/2004   lumbar disc surgury  02/2002   lumbar disc surgury  04/2003   NASAL RECONSTRUCTION     x 3   REMOVAL OF STONES  01/10/2021   Procedure: REMOVAL OF STONES;  Surgeon: Aneita Gwendlyn DASEN, MD;  Location: [facility] ENDOSCOPY;  Service: Endoscopy;;   REVERSE SHOULDER ARTHROPLASTY Left 07/30/2018   Procedure: LEFT REVERSE SHOULDER ARTHROPLASTY;  Surgeon: Melita Drivers, MD;  Location: [facility] OR;   Service: Orthopedics;  Laterality: Left;    s/p ENT surgury     x 3   s/p left shoulder surgury  12/2007   s/p left TKR  02/2005   SPHINCTEROTOMY  01/10/2021   Procedure: SPHINCTEROTOMY;  Surgeon: Aneita Gwendlyn DASEN, MD;  Location: [facility] ENDOSCOPY;  Service: Endoscopy;;   SPINE SURGERY     2 times   TOTAL HIP ARTHROPLASTY Right 05/01/2023   Procedure: TOTAL HIP ARTHROPLASTY ANTERIOR APPROACH;  Surgeon: Fidel Rogue, MD;  Location: [facility] ORS;  Service: Orthopedics;  Laterality: Right;  140   TOTAL KNEE ARTHROPLASTY Right 01/16/2018   Procedure: RIGHT TOTAL KNEE ARTHROPLASTY;  Surgeon: Kay Kemps, MD;  Location: [facility] OR;  Service: Orthopedics;  Laterality: Right;   TOTAL SHOULDER ARTHROPLASTY Right 09/16/2019   Procedure: Right Reverse shoulder arthroplasty;  Surgeon: Melita Drivers, MD;  Location: [facility] ORS;  Service: Orthopedics;  Laterality: Right;    reports that he has never smoked. He has never used smokeless tobacco. He reports current alcohol  use of about 12.0 standard drinks of alcohol  per week. He reports that he does not use drugs. family history includes Alzheimer's disease in his mother; Bradycardia (age of onset: 28) in his father; Lymphoma in his mother; Pancreatic cancer in his brother. Allergies  Allergen Reactions   Dust Mite Extract Other (See Comments)    Nasal Congestion   Other Other (See Comments)    Pet Dander- Runny nose   Current Outpatient Medications on File Prior to Visit  Medication Sig Dispense Refill   apixaban  (ELIQUIS ) 5 MG TABS tablet Take 1 tablet (5 mg total) by mouth 2 (two) times daily. 180 tablet 3   atorvastatin  (LIPITOR) 10 MG tablet Take 1 tablet (10 mg total) by mouth daily. 90 tablet 3   Calcium  Polycarbophil (FIBER-CAPS PO) Take 2 tablets by mouth in the morning.     carvedilol  (COREG ) 3.125 MG tablet Take 1 tablet (3.125 mg total) by mouth 2 (two) times daily. 180 tablet 3   Cholecalciferol (VITAMIN D3) 50 MCG (2000 UT) capsule Take 4,000  Units by mouth daily.     fexofenadine (ALLEGRA) 180 MG tablet Take 180 mg by mouth in the morning and at bedtime.     fluticasone  (FLONASE ) 50 MCG/ACT nasal spray Place 2 sprays into the nose daily as needed for allergies or rhinitis. For allergies.     lisinopril  (ZESTRIL ) 10 MG tablet Take 1 tablet (10 mg total) by mouth daily. 90 tablet 3   naproxen  sodium (ALEVE ) 220 MG tablet Take 660 mg by mouth 2 (two) times daily as needed (Pain).     No current facility-administered medications on file prior to visit.        ROS:  All others reviewed and negative.  Objective        PE:  BP (!) 144/72   Pulse 66   Temp 98.1 F (36.7 C)  Ht 5' 10 (1.778 m)   Wt 221 lb 6.4 oz (100.4 kg)   SpO2 99%   BMI 31.77 kg/m                 Constitutional: Pt appears in NAD               HENT: Head: NCAT.                Right Ear: External ear normal.                 Left Ear: External ear normal.                Eyes: . Pupils are equal, round, and reactive to light. Conjunctivae and EOM are normal               Nose: without d/c or deformity               Neck: Neck supple. Gross normal ROM               Cardiovascular: Normal rate and regular rhythm.                 Pulmonary/Chest: Effort normal and breath sounds without rales or wheezing.                Abd:  Soft, NT, ND, + BS, no organomegaly               Neurological: Pt is alert. At baseline orientation, motor grossly intact               Skin: Skin is warm. No rashes, no other new lesions, LE edema - LLE 2+ edema to knee with diffuse tenderness without skin changes               Psychiatric: Pt behavior is normal without agitation but with marked nervous and depressed affect  Micro: none  Cardiac tracings I have personally interpreted today:  none  Pertinent Radiological findings (summarize): none   Lab Results  Component Value Date   WBC 5.0 07/27/2024   HGB 14.1 07/27/2024   HCT 43.0 07/27/2024   PLT 201 07/27/2024   GLUCOSE  92 07/27/2024   CHOL 138 01/01/2024   TRIG 46.0 01/01/2024   HDL 63.60 01/01/2024   LDLDIRECT 142.0 09/29/2012   LDLCALC 65 01/01/2024   ALT 25 01/01/2024   AST 27 01/01/2024   NA 141 07/27/2024   K 5.1 07/27/2024   CL 104 07/27/2024   CREATININE 1.13 07/27/2024   BUN 18 07/27/2024   CO2 22 07/27/2024   TSH 2.62 01/01/2024   PSA 0.76 01/01/2024   INR 1.2 01/10/2021   HGBA1C 5.6 01/01/2024   Assessment/Plan:  Todd Barrett. is a 73 y.o. White or Caucasian [1] male with  has a past medical history of ALLERGIC RHINITIS (01/08/2010), Allergy, Anxiety, Atrial fibrillation with RVR (HCC) (01/19/2018), CERVICAL RADICULOPATHY, RIGHT (01/08/2010), CHEST PAIN (01/08/2010), DIVERTICULOSIS, COLON (01/08/2010), Dysrhythmia, GERD (gastroesophageal reflux disease), GOUT (01/08/2010), HYPERLIPIDEMIA (01/08/2010), Hypertension, Lumbar disc disease (08/05/2011), Osteoarthritis of right knee, and OSTEOARTHRITIS, KNEES, BILATERAL (01/08/2010).  Chronic anticoagulation Cont eliquis  asd  Atrial fibrillation [facility]) S/p ablation to f/u cardiology soon  Anxiety and depression Mod worsening, for referral to Psychology counseling, but also celexa  20 mg every day,  to f/u any worsening symptoms or concerns   Hyperglycemia Lab Results  Component Value Date   HGBA1C 5.6 01/01/2024   Stable, pt to continue  current medical treatment  - diet, wt control   Hyperlipemia Lab Results  Component Value Date   LDLCALC 65 01/01/2024   Stable, pt to continue current statin lipitor 10 mg qd   Hypertension BP Readings from Last 3 Encounters:  08/10/24 (!) 144/72  07/30/24 120/84  07/09/24 124/79   Uncontrolled, likely reactive,  pt to continue medical treatment coreg  3.125 mg bid, lisinopril  10 mg qd    Pain and swelling of left lower leg Can't r/o DVT with recent procedure though is currently taking eliquis  - now for venous doppler LLE asap  Followup: Return in about 6 months (around  02/07/2025).  Lynwood Rush, MD 08/10/2024 12:56 PM [facility] Medicine

## 2024-08-10 NOTE — Assessment & Plan Note (Signed)
 Mod worsening, for referral to Psychology counseling, but also celexa  20 mg every day,  to f/u any worsening symptoms or concerns

## 2024-08-11 ENCOUNTER — Ambulatory Visit: Payer: Self-pay | Admitting: Internal Medicine

## 2024-08-11 ENCOUNTER — Ambulatory Visit (HOSPITAL_COMMUNITY)
Admission: RE | Admit: 2024-08-11 | Discharge: 2024-08-11 | Disposition: A | Discharge status: Home / Self Care | Source: Ambulatory Visit | Attending: Internal Medicine | Admitting: Internal Medicine

## 2024-08-11 DIAGNOSIS — M7989 Other specified soft tissue disorders: Secondary | ICD-10-CM | POA: Diagnosis not present

## 2024-08-11 DIAGNOSIS — M79662 Pain in left lower leg: Secondary | ICD-10-CM | POA: Diagnosis not present

## 2024-08-27 ENCOUNTER — Ambulatory Visit (HOSPITAL_COMMUNITY)
Admission: RE | Admit: 2024-08-27 | Discharge: 2024-08-27 | Disposition: A | Discharge status: Home / Self Care | Source: Ambulatory Visit | Attending: Physician Assistant | Admitting: Physician Assistant

## 2024-08-27 VITALS — BP 122/64 | HR 71 | Ht 70.0 in | Wt 219.8 lb

## 2024-08-27 DIAGNOSIS — D6869 Other thrombophilia: Secondary | ICD-10-CM | POA: Diagnosis not present

## 2024-08-27 DIAGNOSIS — I4819 Other persistent atrial fibrillation: Secondary | ICD-10-CM | POA: Insufficient documentation

## 2024-08-27 DIAGNOSIS — I4891 Unspecified atrial fibrillation: Secondary | ICD-10-CM | POA: Insufficient documentation

## 2024-08-27 NOTE — Progress Notes (Signed)
 Primary Care Physician: Norleen Lynwood ORN, MD Primary Cardiologist: Dr Verlin Primary Electrophysiologist: Dr Kennyth Referring Physician: Jolynn Pack [facility]   Todd Barrett. is a 73 y.o. male with a history of HLD, HTN, atrial fibrillation who presents for follow up in the [facility]. The patient was initially diagnosed with atrial fibrillation in 2019 after a total knee replacement when he was noted to have a fast, irregular heart rate while working with physical therapy. He was treated with Cardizem  and then placed on Xarelto .  Cardioversion was planned but he went back into normal sinus rhythm prior to hospital discharge. He was later taken off of both Xarelto  and Cardizem  when he had no recurrence of AF it was presumed to be only in the postop period.  He later had an outpatient Holter monitor which did not reveal recurrence of atrial fibrillation.   Patient was seen at the ED 12/21/20 with abdominal pain and was found to be in rate controlled afib. He was started on Eliquis  for stroke prevention. He returned to the ED 01/06/21 with chest pain and was admitted. Workup negative for ACS but he was diagnosed with acute gallstone pancreatitis and underwent cholecystectomy. He converted to SR spontaneously during admission. He was unaware of his afib while in the hospital.   Patient was seen by Dr Verlin on 03/01/24 and found to be in rate controlled afib. He is s/p DCCV 04/26/24 with quick return of afib. He was started on flecainide  and had another DCCV on 05/19/24 as a bridge to ablation.   Patient returns for follow up for atrial fibrillation. He remains in SR today and feels well. He thinks he may have a little more energy in SR. He denies chest pain or groin issues. No bleeding issues on anticoagulation.   Today, he  denies symptoms of palpitations, chest pain, shortness of breath, orthopnea, PND, lower extremity edema, dizziness, presyncope, syncope, bleeding, or  neurologic sequela. The patient is tolerating medications without difficulties and is otherwise without complaint today.    Atrial Fibrillation Risk Factors:  he does not have symptoms or diagnosis of sleep apnea. Negative sleep study 04/02/21 he does not have a history of rheumatic fever. he does have a history of alcohol  use. The patient does not have a history of early familial atrial fibrillation or other arrhythmias.   Atrial Fibrillation Management history:  Previous antiarrhythmic drugs: flecainide   Previous cardioversions: 04/26/24, 05/19/24 Previous ablations: 07/30/24 Anticoagulation history: Xarelto , Eliquis    Past Medical History:  Diagnosis Date   ALLERGIC RHINITIS 01/08/2010   Allergy    dust mites, pet dander   Anxiety    Atrial fibrillation with RVR (HCC) 01/19/2018   CERVICAL RADICULOPATHY, RIGHT 01/08/2010   CHEST PAIN 01/08/2010   DIVERTICULOSIS, COLON 01/08/2010   Dysrhythmia    post op   during PT a fib RVR   GERD (gastroesophageal reflux disease)    Zantac OTC  prn   GOUT 01/08/2010   in past/ when has surgery   HYPERLIPIDEMIA 01/08/2010   Hypertension    Lumbar disc disease 08/05/2011   Osteoarthritis of right knee    end stage   OSTEOARTHRITIS, KNEES, 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 180 tablet 3   atorvastatin  (LIPITOR) 10 MG tablet Take 1 tablet (10 mg total) by mouth daily. 90  tablet 3   Calcium  Polycarbophil (FIBER-CAPS PO) Take 2 tablets by mouth in the morning.     carvedilol  (COREG ) 3.125 MG tablet Take 1 tablet (3.125 mg total) by mouth 2 (two) times daily. 180 tablet 3   Cholecalciferol (VITAMIN D3) 50 MCG (2000 UT) capsule Take 4,000 Units by mouth daily.     fexofenadine (ALLEGRA) 180 MG tablet Take 180 mg by mouth in the morning and at bedtime.     fluticasone  (FLONASE ) 50 MCG/ACT nasal  spray Place 2 sprays into the nose daily as needed for allergies or rhinitis. For allergies. (Patient taking differently: Place 2 sprays into the nose as needed for allergies or rhinitis. For allergies.)     lisinopril  (ZESTRIL ) 10 MG tablet Take 1 tablet (10 mg total) by mouth daily. 90 tablet 3   naproxen  sodium (ALEVE ) 220 MG tablet Take 660 mg by mouth 2 (two) times daily as needed (Pain). (Patient taking differently: Take 660 mg by mouth as needed (Pain).)     citalopram  (CELEXA ) 20 MG tablet Take 1 tablet (20 mg total) by mouth daily. (Patient not taking: Reported on 08/27/2024) 90 tablet 3   hydrochlorothiazide  (MICROZIDE ) 12.5 MG capsule Take 1 capsule (12.5 mg total) by mouth daily. (Patient not taking: Reported on 08/27/2024) 90 capsule 3   No current facility-administered medications for this encounter.    ROS- All systems are reviewed and negative except as per the HPI above.  Physical Exam: Vitals:   08/27/24 1320  BP: 122/64  Pulse: 71  Weight: 99.7 kg  Height: 5' 10 (1.778 m)     GEN: Well nourished, well developed in no acute distress CARDIAC: Regular rate and rhythm, no murmurs, rubs, gallops RESPIRATORY:  Clear to auscultation without rales, wheezing or rhonchi  ABDOMEN: Soft, non-tender, non-distended EXTREMITIES:  No edema; No deformity    Wt Readings from Last 3 Encounters:  08/27/24 99.7 kg  08/10/24 100.4 kg  07/30/24 97.5 kg    EKG today demonstrates  SR Vent. rate 71 BPM PR interval 188 ms QRS duration 90 ms QT/QTcB 386/419 ms   Echo 01/07/21 demonstrated  1. Left ventricular ejection fraction, by estimation, is 50 to 55%. The  left ventricle has low normal function. The left ventricle has no regional  wall motion abnormalities. Left ventricular diastolic parameters were  normal.   2. Right ventricular systolic function is normal. The right ventricular  size is normal.   3. The mitral valve is grossly normal. No evidence of mitral valve   regurgitation.   4. The aortic valve is tricuspid. There is mild calcification of the  aortic valve. There is mild thickening of the aortic valve. Aortic valve  regurgitation is not visualized. No aortic stenosis is present.   5. Aortic dilatation noted. 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 There is borderline dilatation of the  ascending aorta, measuring 37 mm. There is mild dilatation of the  transverse aorta, measuring 37 mm.   Comparison(s): A prior study was performed on 01/29/2018. Prior images  reviewed side by side. Slight decrease in LV function, aortic dilation  noted.   Epic records are reviewed at length today  CHA2DS2-VASc Score = 3  The patient's score is based upon: CHF History: 0 HTN History: 1 Diabetes History: 0 Stroke History: 0 Vascular Disease History: 1 (aortic atherosclerosis) Age Score: 1 Gender Score: 0       ASSESSMENT AND PLAN: Persistent Atrial Fibrillation/atrial flutter (ICD10:  I48.19) The patient's CHA2DS2-VASc score is 3, indicating a 3.2% annual risk of stroke.   S/p afib ablation 07/30/24, now off flecainide .  Patient appears to be maintaining SR Continue Eliquis  5 mg BID with no missed doses for 3 months post ablation. Continue carvedilol  3.125 mg BID  Secondary Hypercoagulable State (ICD10:  D68.69) The patient is at significant risk for stroke/thromboembolism based upon his CHA2DS2-VASc Score of 3.  Continue Apixaban  (Eliquis ). No bleeding issues.    HTN Stable on current regimen  CAD/aortic atherosclerosis CAC score 148 No anginal symptoms Followed by Dr Verlin   Follow up in the AF clinic in 2 months.     Daril Kicks PA-C Afib Clinic [facility] [address] [place], [place] [address] [phone] 08/27/2024 1:35 PM

## 2024-09-10 DIAGNOSIS — Z23 Encounter for immunization: Secondary | ICD-10-CM | POA: Diagnosis not present

## 2024-10-27 DIAGNOSIS — M25572 Pain in left ankle and joints of left foot: Secondary | ICD-10-CM | POA: Diagnosis not present

## 2024-10-27 DIAGNOSIS — G8929 Other chronic pain: Secondary | ICD-10-CM | POA: Diagnosis not present

## 2024-10-27 DIAGNOSIS — M19072 Primary osteoarthritis, left ankle and foot: Secondary | ICD-10-CM | POA: Diagnosis not present

## 2024-10-27 DIAGNOSIS — M19071 Primary osteoarthritis, right ankle and foot: Secondary | ICD-10-CM | POA: Diagnosis not present

## 2024-10-27 DIAGNOSIS — M25571 Pain in right ankle and joints of right foot: Secondary | ICD-10-CM | POA: Diagnosis not present

## 2024-11-01 ENCOUNTER — Ambulatory Visit: Admitting: Student

## 2024-11-05 ENCOUNTER — Ambulatory Visit (HOSPITAL_COMMUNITY)
Admission: RE | Admit: 2024-11-05 | Discharge: 2024-11-05 | Attending: Physician Assistant | Admitting: Physician Assistant

## 2024-11-05 VITALS — BP 126/82 | HR 64 | Ht 70.0 in | Wt 222.6 lb

## 2024-11-05 DIAGNOSIS — I4891 Unspecified atrial fibrillation: Secondary | ICD-10-CM

## 2024-11-05 DIAGNOSIS — I4819 Other persistent atrial fibrillation: Secondary | ICD-10-CM | POA: Diagnosis not present

## 2024-11-05 DIAGNOSIS — D6869 Other thrombophilia: Secondary | ICD-10-CM

## 2024-11-05 NOTE — Progress Notes (Signed)
 Primary Care Physician: Norleen Lynwood ORN, MD Primary Cardiologist: Dr Verlin Primary Electrophysiologist: Dr Kennyth Referring Physician: [facility]   Todd Barrett. is a 73 y.o. male with a history of HLD, HTN, atrial fibrillation who presents for follow up in the [facility]. The patient was initially diagnosed with atrial fibrillation in 2019 after a total knee replacement when he was noted to have a fast, irregular heart rate while working with physical therapy. He was treated with Cardizem  and then placed on Xarelto .  Cardioversion was planned but he went back into normal sinus rhythm prior to hospital discharge. He was later taken off of both Xarelto  and Cardizem  when he had no recurrence of AF it was presumed to be only in the postop period.  He later had an outpatient Holter monitor which did not reveal recurrence of atrial fibrillation.   Patient was seen at the ED 12/21/20 with abdominal pain and was found to be in rate controlled afib. He was started on Eliquis  for stroke prevention. He returned to the ED 01/06/21 with chest pain and was admitted. Workup negative for ACS but he was diagnosed with acute gallstone pancreatitis and underwent cholecystectomy. He converted to SR spontaneously during admission. He was unaware of his afib while in the hospital.   Patient was seen by Dr Verlin on 03/01/24 and found to be in rate controlled afib. He is s/p DCCV 04/26/24 with quick return of afib. He was started on flecainide  and had another DCCV on 05/19/24 as a bridge to ablation. He is s/p afib ablation 07/30/24. Flecainide  was discontinued post procedure.   Patient returns for follow up for atrial fibrillation. He remains in SR today and feels well. His energy level continues to improve. No bleeding issues on anticoagulation. He is seeing an orthopedic surgeon soon to discuss repeat ankle surgery.   Today, he  denies symptoms of palpitations, chest pain,  shortness of breath, orthopnea, PND, lower extremity edema, dizziness, presyncope, syncope, snoring, daytime somnolence, bleeding, or neurologic sequela. The patient is tolerating medications without difficulties and is otherwise without complaint today.    Atrial Fibrillation Risk Factors:  he does not have symptoms or diagnosis of sleep apnea. Negative sleep study 04/02/21 he does not have a history of rheumatic fever. he does have a history of alcohol  use. The patient does not have a history of early familial atrial fibrillation or other arrhythmias.   Atrial Fibrillation Management history:  Previous antiarrhythmic drugs: flecainide   Previous cardioversions: 04/26/24, 05/19/24 Previous ablations: 07/30/24 Anticoagulation history: Xarelto , Eliquis    Past Medical History:  Diagnosis Date   ALLERGIC RHINITIS 01/08/2010   Allergy    dust mites, pet dander   Anxiety    Atrial fibrillation with RVR (HCC) 01/19/2018   CERVICAL RADICULOPATHY, RIGHT 01/08/2010   CHEST PAIN 01/08/2010   DIVERTICULOSIS, COLON 01/08/2010   Dysrhythmia    post op   during PT a fib RVR   GERD (gastroesophageal reflux disease)    Zantac OTC  prn   GOUT 01/08/2010   in past/ when has surgery   HYPERLIPIDEMIA 01/08/2010   Hypertension    Lumbar disc disease 08/05/2011   Osteoarthritis of right knee    end stage   OSTEOARTHRITIS, KNEES, 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 180 tablet 3   atorvastatin  (  LIPITOR) 10 MG tablet Take 1 tablet (10 mg total) by mouth daily. 90 tablet 3   Calcium  Polycarbophil (FIBER-CAPS PO) Take 2 tablets by mouth in the morning.     carvedilol  (COREG ) 3.125 MG tablet Take 1 tablet (3.125 mg total) by mouth 2 (two) times daily. 180 tablet 3   Cholecalciferol (VITAMIN D3) 50 MCG (2000 UT) capsule Take 4,000 Units by mouth daily.      fexofenadine (ALLEGRA) 180 MG tablet Take 180 mg by mouth in the morning and at bedtime.     fluticasone  (FLONASE ) 50 MCG/ACT nasal spray Place 2 sprays into the nose daily as needed for allergies or rhinitis. For allergies. (Patient taking differently: Place 2 sprays into the nose as needed for allergies or rhinitis. For allergies.)     lisinopril  (ZESTRIL ) 10 MG tablet Take 1 tablet (10 mg total) by mouth daily. 90 tablet 3   naproxen  sodium (ALEVE ) 220 MG tablet Take 660 mg by mouth 2 (two) times daily as needed (Pain). (Patient taking differently: Take 660 mg by mouth as needed (Pain).)     predniSONE  (DELTASONE ) 10 MG tablet 60mg  x 2 days, 50mg  x 2 days, 40mg  x 2 days, 30mg  x 2 days, 20mg  x 2 days, 10mg  x 2 days     No current facility-administered medications for this encounter.    ROS- All systems are reviewed and negative except as per the HPI above.  Physical Exam: Vitals:   11/05/24 1016  BP: 126/82  Pulse: 64  Weight: 101 kg  Height: 5' 10 (1.778 m)    GEN: Well nourished, well developed in no acute distress CARDIAC: Regular rate and rhythm, no murmurs, rubs, gallops RESPIRATORY:  Clear to auscultation without rales, wheezing or rhonchi  ABDOMEN: Soft, non-tender, non-distended EXTREMITIES:  No edema; No deformity    Wt Readings from Last 3 Encounters:  11/05/24 101 kg  08/27/24 99.7 kg  08/10/24 100.4 kg    EKG Interpretation Date/Time:  Friday November 05 2024 10:24:02 EST Ventricular Rate:  64 PR Interval:  192 QRS Duration:  92 QT Interval:  408 QTC Calculation: 420 R Axis:   21  Text Interpretation: Normal sinus rhythm Normal ECG When compared with ECG of 27-Aug-2024 13:29, No significant change was found Confirmed by Crystall Donaldson ([id number]) on 11/05/2024 10:41:51 AM    Echo 01/07/21 demonstrated  1. Left ventricular ejection fraction, by estimation, is 50 to 55%. The  left ventricle has low normal function. The left ventricle has no regional  wall motion  abnormalities. Left ventricular diastolic parameters were  normal.   2. Right ventricular systolic function is normal. The right ventricular  size is normal.   3. The mitral valve is grossly normal. No evidence of mitral valve  regurgitation.   4. The aortic valve is tricuspid. There is mild calcification of the  aortic valve. There is mild thickening of the aortic valve. Aortic valve  regurgitation is not visualized. No aortic stenosis is present.   5. Aortic dilatation noted. 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 There is borderline dilatation of the  ascending aorta, measuring 37 mm. There is mild dilatation of the  transverse aorta, measuring 37 mm.   Comparison(s): A prior study was performed on 01/29/2018. Prior images  reviewed side by side. Slight decrease in LV function, aortic dilation  noted.   Epic records are reviewed at length today   CHA2DS2-VASc Score = 3  The patient's score  is based upon: CHF History: 0 HTN History: 1 Diabetes History: 0 Stroke History: 0 Vascular Disease History: 1 (aortic atherosclerosis) Age Score: 1 Gender Score: 0       ASSESSMENT AND PLAN: Persistent Atrial Fibrillation/atrial flutter (ICD10:  I48.19) The patient's CHA2DS2-VASc score is 3, indicating a 3.2% annual risk of stroke.   S/p afib ablation 07/30/24, off flecainide .  Patient appears to be maintaining SR Continue Eliquis  5 mg BID Continue carvedilol  3.125 mg BID  Secondary Hypercoagulable State (ICD10:  D68.69) The patient is at significant risk for stroke/thromboembolism based upon his CHA2DS2-VASc Score of 3.  Continue Apixaban  (Eliquis ). No bleeding issues.    HTN Stable on current regimen  CAD/aortic atherosclerosis No anginal symptoms Followed by Dr Verlin   Follow up with Dr Kennyth in 6 months.    Daril Kicks PA-C [facility] [address] [place], [place]  [address] [phone] 11/05/2024 10:42 AM

## 2025-07-11 ENCOUNTER — Ambulatory Visit
